# Patient Record
Sex: Male | Born: 1945
Health system: Southern US, Community
[De-identification: ages and names within clinical notes are randomized; demographics above are authoritative.]

## PROBLEM LIST (undated history)

## (undated) ENCOUNTER — Emergency Department (HOSPITAL_COMMUNITY)

## (undated) DIAGNOSIS — K219 Gastro-esophageal reflux disease without esophagitis: Secondary | ICD-10-CM

## (undated) DIAGNOSIS — I10 Essential (primary) hypertension: Secondary | ICD-10-CM

## (undated) DIAGNOSIS — E785 Hyperlipidemia, unspecified: Secondary | ICD-10-CM

## (undated) DIAGNOSIS — M545 Low back pain, unspecified: Secondary | ICD-10-CM

## (undated) DIAGNOSIS — F32A Depression, unspecified: Secondary | ICD-10-CM

## (undated) DIAGNOSIS — G4730 Sleep apnea, unspecified: Secondary | ICD-10-CM

## (undated) DIAGNOSIS — Z86010 Personal history of colonic polyps: Secondary | ICD-10-CM

## (undated) DIAGNOSIS — G4733 Obstructive sleep apnea (adult) (pediatric): Secondary | ICD-10-CM

## (undated) DIAGNOSIS — F329 Major depressive disorder, single episode, unspecified: Secondary | ICD-10-CM

## (undated) HISTORY — PX: KNEE ARTHROSCOPY: SHX127

## (undated) HISTORY — DX: Personal history of colonic polyps: Z86.010

## (undated) HISTORY — DX: Essential (primary) hypertension: I10

## (undated) HISTORY — DX: Sleep apnea, unspecified: G47.30

## (undated) HISTORY — DX: Obstructive sleep apnea (adult) (pediatric): G47.33

## (undated) HISTORY — DX: Low back pain, unspecified: M54.50

## (undated) HISTORY — DX: Major depressive disorder, single episode, unspecified: F32.9

## (undated) HISTORY — DX: Hyperlipidemia, unspecified: E78.5

## (undated) HISTORY — PX: OTHER SURGICAL HISTORY: SHX169

## (undated) HISTORY — DX: Low back pain: M54.5

## (undated) HISTORY — PX: TONSILLECTOMY: SUR1361

## (undated) HISTORY — PX: WISDOM TOOTH EXTRACTION: SHX21

## (undated) HISTORY — PX: COLONOSCOPY: SHX174

## (undated) HISTORY — DX: Depression, unspecified: F32.A

## (undated) HISTORY — DX: Gastro-esophageal reflux disease without esophagitis: K21.9

## (undated) HISTORY — PX: SHOULDER ARTHROSCOPY W/ ROTATOR CUFF REPAIR: SHX2400

---

## 2002-10-08 HISTORY — PX: COLONOSCOPY: SHX174

## 2004-11-08 ENCOUNTER — Ambulatory Visit: Payer: Self-pay | Admitting: Internal Medicine

## 2004-12-05 ENCOUNTER — Ambulatory Visit: Payer: Self-pay | Admitting: Internal Medicine

## 2005-01-16 ENCOUNTER — Ambulatory Visit: Payer: Self-pay | Admitting: Internal Medicine

## 2005-03-26 ENCOUNTER — Ambulatory Visit: Payer: Self-pay | Admitting: Internal Medicine

## 2005-03-27 ENCOUNTER — Ambulatory Visit: Payer: Self-pay

## 2005-04-12 ENCOUNTER — Ambulatory Visit: Payer: Self-pay | Admitting: Internal Medicine

## 2006-11-01 ENCOUNTER — Ambulatory Visit: Payer: Self-pay | Admitting: Internal Medicine

## 2006-12-30 ENCOUNTER — Ambulatory Visit: Payer: Self-pay | Admitting: Internal Medicine

## 2006-12-30 LAB — CONVERTED CEMR LAB
ALT: 27 units/L (ref 0–40)
Bilirubin Urine: NEGATIVE
Bilirubin, Direct: 0.1 mg/dL (ref 0.0–0.3)
Calcium: 9.2 mg/dL (ref 8.4–10.5)
Cholesterol: 190 mg/dL (ref 0–200)
Eosinophils Absolute: 0.1 10*3/uL (ref 0.0–0.6)
Eosinophils Relative: 1.4 % (ref 0.0–5.0)
GFR calc Af Amer: 128 mL/min
GFR calc non Af Amer: 106 mL/min
Glucose, Bld: 110 mg/dL — ABNORMAL HIGH (ref 70–99)
HDL: 35.2 mg/dL — ABNORMAL LOW (ref >39.0)
Hemoglobin, Urine: NEGATIVE
Lymphocytes Relative: 29.5 % (ref 12.0–46.0)
MCV: 91.8 fL (ref 78.0–100.0)
Monocytes Relative: 6.4 % (ref 3.0–11.0)
Neutro Abs: 3.7 10*3/uL (ref 1.4–7.7)
PSA: 1.56 ng/mL (ref 0.10–4.00)
Platelets: 261 10*3/uL (ref 150–400)
TSH: 1.43 microintl units/mL (ref 0.35–5.50)
Total CHOL/HDL Ratio: 5.4
Triglycerides: 113 mg/dL (ref 0–149)
Urine Glucose: NEGATIVE mg/dL
WBC: 6.1 10*3/uL (ref 4.5–10.5)

## 2007-01-07 ENCOUNTER — Ambulatory Visit: Payer: Self-pay | Admitting: Internal Medicine

## 2007-10-13 ENCOUNTER — Ambulatory Visit: Payer: Self-pay | Admitting: Internal Medicine

## 2007-10-13 DIAGNOSIS — I10 Essential (primary) hypertension: Secondary | ICD-10-CM | POA: Insufficient documentation

## 2007-10-13 DIAGNOSIS — F329 Major depressive disorder, single episode, unspecified: Secondary | ICD-10-CM | POA: Insufficient documentation

## 2007-10-13 DIAGNOSIS — E785 Hyperlipidemia, unspecified: Secondary | ICD-10-CM | POA: Insufficient documentation

## 2007-10-13 DIAGNOSIS — R7309 Other abnormal glucose: Secondary | ICD-10-CM | POA: Insufficient documentation

## 2007-10-13 DIAGNOSIS — F3289 Other specified depressive episodes: Secondary | ICD-10-CM | POA: Insufficient documentation

## 2008-02-10 ENCOUNTER — Ambulatory Visit: Payer: Self-pay | Admitting: Internal Medicine

## 2008-02-10 DIAGNOSIS — R5381 Other malaise: Secondary | ICD-10-CM | POA: Insufficient documentation

## 2008-02-10 DIAGNOSIS — R5383 Other fatigue: Secondary | ICD-10-CM

## 2008-02-11 ENCOUNTER — Encounter: Payer: Self-pay | Admitting: Internal Medicine

## 2008-10-19 ENCOUNTER — Ambulatory Visit: Payer: Self-pay | Admitting: Internal Medicine

## 2008-10-19 LAB — CONVERTED CEMR LAB
ALT: 50 units/L (ref 0–53)
AST: 28 units/L (ref 0–37)
Albumin: 4.2 g/dL (ref 3.5–5.2)
Alkaline Phosphatase: 66 units/L (ref 39–117)
BUN: 15 mg/dL (ref 6–23)
Bilirubin Urine: NEGATIVE
CO2: 29 meq/L (ref 19–32)
Chloride: 107 meq/L (ref 96–112)
Cholesterol: 210 mg/dL (ref 0–200)
Creatinine, Ser: 0.9 mg/dL (ref 0.4–1.5)
Glucose, Bld: 121 mg/dL — ABNORMAL HIGH (ref 70–99)
Hgb A1c MFr Bld: 5.3 % (ref 4.6–6.0)
Ketones, ur: NEGATIVE mg/dL
Total CHOL/HDL Ratio: 6.4
Total Protein, Urine: NEGATIVE mg/dL
Triglycerides: 74 mg/dL (ref 0–149)
Urine Glucose: NEGATIVE mg/dL
pH: 5.5 (ref 5.0–8.0)

## 2009-10-13 ENCOUNTER — Ambulatory Visit: Payer: Self-pay | Admitting: Internal Medicine

## 2009-10-13 DIAGNOSIS — M545 Low back pain, unspecified: Secondary | ICD-10-CM | POA: Insufficient documentation

## 2009-10-13 DIAGNOSIS — M533 Sacrococcygeal disorders, not elsewhere classified: Secondary | ICD-10-CM | POA: Insufficient documentation

## 2010-04-12 ENCOUNTER — Ambulatory Visit: Payer: Self-pay | Admitting: Internal Medicine

## 2010-11-05 LAB — CONVERTED CEMR LAB
ALT: 35 units/L (ref 0–53)
AST: 23 units/L (ref 0–37)
Alkaline Phosphatase: 64 units/L (ref 39–117)
BUN: 16 mg/dL (ref 6–23)
Basophils Absolute: 0 10*3/uL (ref 0.0–0.1)
Basophils Relative: 0.4 % (ref 0.0–3.0)
Bilirubin Urine: NEGATIVE
Bilirubin, Direct: 0.1 mg/dL (ref 0.0–0.3)
CO2: 29 meq/L (ref 19–32)
Calcium: 9.2 mg/dL (ref 8.4–10.5)
Eosinophils Relative: 1.1 % (ref 0.0–5.0)
GFR calc non Af Amer: 87.59 mL/min (ref >60)
Glucose, Bld: 107 mg/dL — ABNORMAL HIGH (ref 70–99)
HCT: 43.2 % (ref 39.0–52.0)
Hemoglobin, Urine: NEGATIVE
Ketones, ur: NEGATIVE mg/dL
Leukocytes, UA: NEGATIVE
Lymphs Abs: 1.8 10*3/uL (ref 0.7–4.0)
MCV: 96.2 fL (ref 78.0–100.0)
Monocytes Absolute: 0.7 10*3/uL (ref 0.1–1.0)
Monocytes Relative: 8.8 % (ref 3.0–12.0)
Neutrophils Relative %: 66.5 % (ref 43.0–77.0)
Nitrite: NEGATIVE
PSA: 1.04 ng/mL (ref 0.10–4.00)
Potassium: 5 meq/L (ref 3.5–5.1)
RBC: 4.49 M/uL (ref 4.22–5.81)
Specific Gravity, Urine: >=1.03 (ref 1.000–1.030)
Total Bilirubin: 0.4 mg/dL (ref 0.3–1.2)
Total CHOL/HDL Ratio: 5
Total Protein, Urine: NEGATIVE mg/dL
Total Protein: 6.9 g/dL (ref 6.0–8.3)
Urine Glucose: NEGATIVE mg/dL
Urobilinogen, UA: 0.2 (ref 0.0–1.0)
VLDL: 18.2 mg/dL (ref 0.0–40.0)
WBC: 8 10*3/uL (ref 4.5–10.5)
pH: 5.5 (ref 5.0–8.0)

## 2010-11-07 NOTE — Assessment & Plan Note (Signed)
Summary: MEDS--FU---STC   Vital Signs:  Patient profile:   65 year old male Height:      76 inches Weight:      264 pounds BMI:     32.25 Temp:     98.2 degrees F oral Pulse rate:   82 / minute BP sitting:   142 / 84  (left arm)  Vitals Entered By: Tora Perches (October 13, 2009 8:08 AM) CC: f/u   CC:  f/u.  History of Present Illness: The patient presents for a follow up of hypertension, GERD.  C/o LBP C/o tail bone pain x  months - no injury  Preventive Screening-Counseling & Management  Alcohol-Tobacco     Smoking Status: never  Current Medications (verified): 1)  Protonix 40 Mg  Tbec (Pantoprazole Sodium) .Marland Kitchen.. 1 Po Once Daily 2)  Lotrel 5-10 Mg  Caps (Amlodipine Besy-Benazepril Hcl) .... 2 By Mouth Qd 3)  Vitamin D3 1000 Unit  Tabs (Cholecalciferol) .Marland Kitchen.. 1 Qd 4)  Aspirin 81 Mg  Tbec (Aspirin) .... One By Mouth Every Day  Allergies: 1)  Verapamil 2)  Enalapril Maleate  Past History:  Family History: Last updated: 10/13/2007 Family History Hypertension  Social History: Last updated: 02/10/2008 Retired, owns a Electrical engineer business Divorced Daughter died of breast CA Mar 10, 2002  Past Medical History: Depression GERD Hyperlipidemia Hypertension Low back pain  Review of Systems  The patient denies fever, weight loss, chest pain, and hemoptysis.    Physical Exam  General:  NAD Ears:  External ear exam shows no significant lesions or deformities.  Otoscopic examination reveals clear canals, tympanic membranes are intact bilaterally without bulging, retraction, inflammation or discharge. Hearing is grossly normal bilaterally. Nose:  External nasal examination shows no deformity or inflammation. Nasal mucosa are pink and moist without lesions or exudates. Mouth:  Oral mucosa and oropharynx without lesions or exudates.  Teeth in good repair. Lungs:  Normal respiratory effort, chest expands symmetrically. Lungs are clear to auscultation, no crackles or  wheezes. Heart:  Normal rate and regular rhythm. S1 and S2 normal without gallop, murmur, click, rub or other extra sounds. Abdomen:  Bowel sounds positive,abdomen soft and non-tender without masses, organomegaly or hernias noted. Msk:  No deformity or scoliosis noted of thoracic or lumbar spine.  Coccyx tender Neurologic:  No cranial nerve deficits noted. Station and gait are normal. Plantar reflexes are down-going bilaterally. DTRs are symmetrical throughout. Sensory, motor and coordinative functions appear intact. Skin:  Intact without suspicious lesions or rashes Psych:  Cognition and judgment appear intact. Alert and cooperative with normal attention span and concentration. No apparent delusions, illusions, hallucinations   Impression & Recommendations:  Problem # 1:  COCCYGEAL PAIN (ICD-724.79) Assessment New See "Patient Instructions".   Problem # 2:  LOW BACK PAIN (ICD-724.2) Assessment: New See "Patient Instructions".  His updated medication list for this problem includes:    Aspirin 81 Mg Tbec (Aspirin) ..... One by mouth every day  Problem # 3:  HYPERTENSION (ICD-401.9) Assessment: Unchanged  His updated medication list for this problem includes:    Lotrel 5-10 Mg Caps (Amlodipine besy-benazepril hcl) .Marland Kitchen... 2 by mouth qd  BP today: 142/84 Prior BP: 154/94 (10/19/2008)  Labs Reviewed: K+: 4.5 (10/19/2008) Creat: : 0.9 (10/19/2008)   Chol: 210 (10/19/2008)   HDL: 32.9 (10/19/2008)   LDL: DEL (10/19/2008)   TG: 74 (10/19/2008)  Problem # 4:  GERD (ICD-530.81) Assessment: Unchanged  His updated medication list for this problem includes:    Protonix 40 Mg  Tbec (Pantoprazole sodium) .Marland Kitchen... 1 po once daily  Complete Medication List: 1)  Protonix 40 Mg Tbec (Pantoprazole sodium) .Marland Kitchen.. 1 po once daily 2)  Lotrel 5-10 Mg Caps (Amlodipine besy-benazepril hcl) .... 2 by mouth qd 3)  Vitamin D3 1000 Unit Tabs (Cholecalciferol) .Marland Kitchen.. 1 qd 4)  Aspirin 81 Mg Tbec (Aspirin) ....  One by mouth every day  Other Orders: Zoster (Shingles) Vaccine Live [phone]) Admin 1st Vaccine (60454)  Patient Instructions: 1)  Use stretching exercises that I have provided (15 min. or longer every day) 2)  Get a wedge pillow 3)  Try to eat more raw plant food, fresh and dry fruit, raw almonds, leafy vegetables, whole foods and less red meat, less animal fat. Poultry and fish is better for you than pork and beef. Avoid processed foods (canned soups, hot dogs, sausage, bacon , frozen dinners). Avoid corn syrup, high fructose syrup or aspartam and Splenda  containing drinks. Honey, Agave and Stevia are better sweeteners. Make your own  dressing with olive oil, wine vinegar, lemon juce, garlic etc. for your salads.  4)  Please schedule a follow-up appointment in 6 months well w/labs v70.0. Prescriptions: LOTREL 5-10 MG  CAPS (AMLODIPINE BESY-BENAZEPRIL HCL) 2 by mouth qd  #60 x 12   Entered and Authorized by:   Tresa Garter MD   Signed by:   Tresa Garter MD on 10/13/2009   Method used:   Electronically to        [facility] (retail)       [address]       [place], [place]  [address]       Ph: [phone] or [phone]       Fax: [phone]   RxID:   [id number] PROTONIX 40 MG  TBEC (PANTOPRAZOLE SODIUM) 1 po once daily  #30 x 12   Entered and Authorized by:   Tresa Garter MD   Signed by:   Tresa Garter MD on 10/13/2009   Method used:   Electronically to        [facility] (retail)       [address]       [place], [place]  [address]       Ph: [phone] or [phone]       Fax: [phone]   RxID:   [id number]    Immunizations Administered:  Zostavax # 1:    Vaccine Type: Zostavax    Site: left deltoid    Mfr: Merck    Dose: 0.5 ml    Route: [place]    Given by: Tora Perches    Exp. Date: 10/05/2011    Lot #: [id number]    VIS given: 07/20/05 given October 13, 2009.

## 2010-11-07 NOTE — Assessment & Plan Note (Signed)
Summary: 6 MOS CPX/WELLPATH/#/CD   Vital Signs:  Patient profile:   65 year old male Height:      76 inches (193.04 cm) Weight:      261 pounds (118.64 kg) BMI:     31.88 O2 Sat:      97 % on Room air Temp:     98.4 degrees F (36.89 degrees C) oral Pulse rate:   75 / minute Pulse rhythm:   regular Resp:     16 per minute BP sitting:   128 / 88  (left arm) Cuff size:   regular  Vitals Entered By: Lanier Prude, CMA(AAMA) (April 12, 2010 8:23 AM)  O2 Flow:  Room air CC: CPX Is Patient Diabetic? No   CC:  CPX.  History of Present Illness: The patient presents for a wellness examination   Preventive Screening-Counseling & Management  Alcohol-Tobacco     Smoking Status: never  Caffeine-Diet-Exercise     Does Patient Exercise: yes  Current Medications (verified): 1)  Protonix 40 Mg  Tbec (Pantoprazole Sodium) .Marland Kitchen.. 1 Po Once Daily 2)  Lotrel 5-10 Mg  Caps (Amlodipine Besy-Benazepril Hcl) .... 2 By Mouth Qd 3)  Vitamin D3 1000 Unit  Tabs (Cholecalciferol) .Marland Kitchen.. 1 Qd 4)  Aspirin 81 Mg  Tbec (Aspirin) .... One By Mouth Every Day  Allergies (verified): 1)  Verapamil 2)  Enalapril Maleate  Past History:  Past Medical History: Last updated: 10/13/2009 Depression GERD Hyperlipidemia Hypertension Low back pain  Past Surgical History: L shoulder rot cuff R knee arthrosc  Family History: Family History Hypertension F died at 62  Social History: Retired, owns a Insurance underwriter Divorced Daughter died of breast CA 2002/02/23 Never Smoked Alcohol use-yes Regular exercise-yes seldom; working physicallly all thetime Does Patient Exercise:  yes  Review of Systems  The patient denies anorexia, fever, weight loss, weight gain, vision loss, decreased hearing, hoarseness, chest pain, syncope, dyspnea on exertion, peripheral edema, prolonged cough, headaches, hemoptysis, abdominal pain, melena, hematochezia, severe indigestion/heartburn, hematuria, incontinence, genital  sores, muscle weakness, suspicious skin lesions, transient blindness, difficulty walking, depression, unusual weight change, abnormal bleeding, enlarged lymph nodes, angioedema, and testicular masses.    Physical Exam  General:  NAD Head:  Normocephalic and atraumatic without obvious abnormalities. No apparent alopecia or balding. Eyes:  No corneal or conjunctival inflammation noted. EOMI. Perrla. Ears:  External ear exam shows no significant lesions or deformities.  Otoscopic examination reveals clear canals, tympanic membranes are intact bilaterally without bulging, retraction, inflammation or discharge. Hearing is grossly normal bilaterally. Nose:  External nasal examination shows no deformity or inflammation. Nasal mucosa are pink and moist without lesions or exudates. Mouth:  Oral mucosa and oropharynx without lesions or exudates.  Teeth in good repair. Neck:  No deformities, masses, or tenderness noted. Lungs:  Normal respiratory effort, chest expands symmetrically. Lungs are clear to auscultation, no crackles or wheezes. Heart:  Normal rate and regular rhythm. S1 and S2 normal without gallop, murmur, click, rub or other extra sounds. Abdomen:  Bowel sounds positive,abdomen soft and non-tender without masses, organomegaly or hernias noted. Neurologic:  No cranial nerve deficits noted. Station and gait are normal. Plantar reflexes are down-going bilaterally. DTRs are symmetrical throughout. Sensory, motor and coordinative functions appear intact. Skin:  Intact without suspicious lesions or rashes Cervical Nodes:  No lymphadenopathy noted Psych:  Cognition and judgment appear intact. Alert and cooperative with normal attention span and concentration. No apparent delusions, illusions, hallucinations   Impression & Recommendations:  Problem #  1:  WELL ADULT EXAM (ICD-V70.0) Assessment New Colon about 5 years ago. He will be recalled by GI for a repeat colonoscopy Orders: EKG w/  Interpretation (93000) Health and age related issues were discussed. Available screening tests and vaccinations were discussed as well. Healthy life style including good diet and execise was discussed.  Labs is pending  Will give dT and Flu vaccines this Fall  Problem # 2:  HYPERTENSION (ICD-401.9) Assessment: Unchanged  His updated medication list for this problem includes:    Lotrel 5-10 Mg Caps (Amlodipine besy-benazepril hcl) .Marland Kitchen... 2 by mouth qd  Problem # 3:  HYPERLIPIDEMIA (ICD-272.4) Assessment: Comment Only  Problem # 4:  DEPRESSION (ICD-311) Assessment: Improved  Complete Medication List: 1)  Protonix 40 Mg Tbec (Pantoprazole sodium) .Marland Kitchen.. 1 po once daily 2)  Lotrel 5-10 Mg Caps (Amlodipine besy-benazepril hcl) .... 2 by mouth qd 3)  Vitamin D3 1000 Unit Tabs (Cholecalciferol) .Marland Kitchen.. 1 qd 4)  Aspirin 81 Mg Tbec (Aspirin) .... One by mouth every day  Patient Instructions: 1)  Please schedule a follow-up appointment in 12  months well w/labs. Prescriptions: PROTONIX 40 MG  TBEC (PANTOPRAZOLE SODIUM) 1 po once daily  #30 x 12   Entered and Authorized by:   Tresa Garter MD   Signed by:   Tresa Garter MD on 04/12/2010   Method used:   Electronically to        [facility] (retail)       [address]       [place], [place]  [address]       Ph: [phone] or [phone]       Fax: [phone]   RxID:   [id number] LOTREL 5-10 MG  CAPS (AMLODIPINE BESY-BENAZEPRIL HCL) 2 by mouth qd  #60 x 12   Entered and Authorized by:   Tresa Garter MD   Signed by:   Tresa Garter MD on 04/12/2010   Method used:   Electronically to        [facility] (retail)       [address]       [place], [place]  [address]       Ph: [phone] or [phone]       Fax: [phone]   RxID:   [id number]

## 2011-02-23 NOTE — Assessment & Plan Note (Signed)
[facility]                           PRIMARY CARE OFFICE NOTE   NAME:Hector Edwards                         MRN:          [id number]  DATE:01/07/2007                            DOB:          09/04/1946    The patient is a 65 year old male who presents for wellness examination.   PAST MEDICAL HISTORY, FAMILY HISTORY, SOCIAL HISTORY:  As per April 27, 2003 note.  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  He lives by himself.  His daughter died.   CURRENT MEDICATIONS:  Reviewed with the patient.   ALLERGIES:  None.   REVIEW OF SYSTEMS:  No chest pain or shortness of breath, no syncope, no  neurologic complaints, no headache.  The rest of the 18 point review of  system is negative.   PHYSICAL EXAMINATION:  Blood pressure 160/95, pulse 79, temperature  96.7, weight 251 pounds.  He looks well.  He is in no acute distress.  HEENT:  Moist mucosa.  NECK:  Supple. No thyromegaly.  Slight left carotid bruit.  LUNGS:  Clear to auscultation and percussion, no wheeze or rales.  HEART:  S1, S2, no murmur, no gallop.  ABDOMEN:  Soft, nontender, no organomegaly, no mass palpable.  EXTREMITIES:  No edema.  He is alert, oriented and cooperative.  He denies being depressed.  SKIN:  Clear.  RECTAL:  Examination reveals a normal prostate, stool guaiac negative,  no masses.   LABS:  On December 30, 2006, CBC normal, glucose 110, cholesterol 190, HDL  35, LDL 132.  TSH normal.  PSA 1.56, urinalysis is normal.   EKG with normal sinus rhythm.   ASSESSMENT AND PLAN:  1. Normal wellness examination.  Age/health-related issues discussed.      Healthy lifestyle discussed.  He had colonoscopy in 2004.  Repeat      exam in 12 months.  2. Hypertension.  Will increase Lotrel to 10/40 and recheck in 3      months.  3. Left carotid bruit; obtain carotid Doppler ultrasound.    Georgina Quint. Plotnikov, MD  Electronically Signed   AVP/MedQ  DD:  01/09/2007  DT: 01/09/2007  Job #: [id number]

## 2011-04-14 ENCOUNTER — Other Ambulatory Visit: Payer: Self-pay | Admitting: Internal Medicine

## 2011-04-28 ENCOUNTER — Other Ambulatory Visit: Payer: Self-pay | Admitting: Internal Medicine

## 2011-08-06 ENCOUNTER — Other Ambulatory Visit: Payer: Self-pay | Admitting: Internal Medicine

## 2011-08-07 ENCOUNTER — Encounter: Payer: Self-pay | Admitting: Internal Medicine

## 2011-08-08 ENCOUNTER — Ambulatory Visit (INDEPENDENT_AMBULATORY_CARE_PROVIDER_SITE_OTHER): Payer: No Typology Code available for payment source | Admitting: Internal Medicine

## 2011-08-08 ENCOUNTER — Encounter: Payer: Self-pay | Admitting: Internal Medicine

## 2011-08-08 VITALS — BP 190/100 | HR 88 | Temp 97.2°F | Resp 16 | Wt 276.0 lb

## 2011-08-08 DIAGNOSIS — E785 Hyperlipidemia, unspecified: Secondary | ICD-10-CM

## 2011-08-08 DIAGNOSIS — K219 Gastro-esophageal reflux disease without esophagitis: Secondary | ICD-10-CM

## 2011-08-08 DIAGNOSIS — M545 Low back pain, unspecified: Secondary | ICD-10-CM

## 2011-08-08 DIAGNOSIS — M1990 Unspecified osteoarthritis, unspecified site: Secondary | ICD-10-CM | POA: Insufficient documentation

## 2011-08-08 DIAGNOSIS — F329 Major depressive disorder, single episode, unspecified: Secondary | ICD-10-CM

## 2011-08-08 DIAGNOSIS — Z23 Encounter for immunization: Secondary | ICD-10-CM

## 2011-08-08 DIAGNOSIS — I10 Essential (primary) hypertension: Secondary | ICD-10-CM

## 2011-08-08 MED ORDER — MELOXICAM 15 MG PO TABS: 15.0000 mg | ORAL_TABLET | Freq: Every day | ORAL | Status: DC | PRN

## 2011-08-08 MED ORDER — OLMESARTAN-AMLODIPINE-HCTZ 40-10-25 MG PO TABS: 1.0000 | ORAL_TABLET | ORAL | Status: DC

## 2011-08-08 MED ORDER — PANTOPRAZOLE SODIUM 40 MG PO TBEC: 40.0000 mg | DELAYED_RELEASE_TABLET | Freq: Every day | ORAL | Status: DC

## 2011-08-08 MED ORDER — TRAMADOL HCL 50 MG PO TABS: 50.0000 mg | ORAL_TABLET | Freq: Two times a day (BID) | ORAL | Status: DC | PRN

## 2011-08-08 NOTE — Assessment & Plan Note (Signed)
We can try Mobic, Tramadol

## 2011-08-08 NOTE — Progress Notes (Signed)
  Subjective:    Patient ID: Hector Edwards, male    DOB: 1946-08-03, 65 y.o.   MRN: [id number]  HPI  The patient presents for a follow-up of  chronic hypertension, chronic dyslipidemia, depression, LBP controlled with medicines     Review of Systems  Constitutional: Negative for appetite change, fatigue and unexpected weight change.  HENT: Negative for nosebleeds, congestion, sore throat, sneezing, trouble swallowing and neck pain.   Eyes: Negative for itching and visual disturbance.  Respiratory: Negative for cough.   Cardiovascular: Negative for chest pain, palpitations and leg swelling.  Gastrointestinal: Negative for nausea, diarrhea, blood in stool and abdominal distention.  Genitourinary: Negative for frequency and hematuria.  Musculoskeletal: Negative for back pain, joint swelling and gait problem.  Skin: Negative for rash.  Neurological: Negative for dizziness, tremors, speech difficulty and weakness.  Psychiatric/Behavioral: Negative for sleep disturbance, dysphoric mood and agitation. The patient is not nervous/anxious.    Wt Readings from Last 3 Encounters:  08/08/11 276 lb (125.193 kg)  04/12/10 261 lb (118.389 kg)  10/13/09 264 lb (119.75 kg)   BP Readings from Last 3 Encounters:  08/08/11 190/100  04/12/10 128/88  10/13/09 142/84        Objective:   Physical Exam  Constitutional: He is oriented to person, place, and time. He appears well-developed.  HENT:  Mouth/Throat: Oropharynx is clear and moist.  Eyes: Conjunctivae are normal. Pupils are equal, round, and reactive to light.  Neck: Normal range of motion. No JVD present. No thyromegaly present.  Cardiovascular: Normal rate, regular rhythm, normal heart sounds and intact distal pulses.  Exam reveals no gallop and no friction rub.   No murmur heard. Pulmonary/Chest: Effort normal and breath sounds normal. No respiratory distress. He has no wheezes. He has no rales. He exhibits no tenderness.  Abdominal:  Soft. Bowel sounds are normal. He exhibits no distension and no mass. There is no tenderness. There is no rebound and no guarding.  Musculoskeletal: Normal range of motion. He exhibits no edema and no tenderness.  Lymphadenopathy:    He has no cervical adenopathy.  Neurological: He is alert and oriented to person, place, and time. He has normal reflexes. No cranial nerve deficit. He exhibits normal muscle tone. Coordination normal.  Skin: Skin is warm and dry. No rash noted.  Psychiatric: He has a normal mood and affect. His behavior is normal. Judgment and thought content normal.          Assessment & Plan:

## 2011-08-12 ENCOUNTER — Encounter: Payer: Self-pay | Admitting: Internal Medicine

## 2011-08-12 DIAGNOSIS — K219 Gastro-esophageal reflux disease without esophagitis: Secondary | ICD-10-CM | POA: Insufficient documentation

## 2011-08-12 NOTE — Assessment & Plan Note (Signed)
Not well controlled See Meds

## 2011-08-12 NOTE — Assessment & Plan Note (Signed)
Not on Rx now 

## 2011-08-12 NOTE — Assessment & Plan Note (Signed)
See OA A/P

## 2011-08-12 NOTE — Assessment & Plan Note (Signed)
Continue with current prescription therapy as reflected on the Med list.  

## 2011-08-12 NOTE — Assessment & Plan Note (Signed)
  On diet  

## 2011-11-07 ENCOUNTER — Ambulatory Visit (INDEPENDENT_AMBULATORY_CARE_PROVIDER_SITE_OTHER): Payer: No Typology Code available for payment source | Admitting: Internal Medicine

## 2011-11-07 ENCOUNTER — Encounter: Payer: Self-pay | Admitting: Internal Medicine

## 2011-11-07 VITALS — BP 162/100 | HR 88 | Temp 97.0°F | Resp 16 | Wt 273.0 lb

## 2011-11-07 DIAGNOSIS — M545 Low back pain, unspecified: Secondary | ICD-10-CM

## 2011-11-07 DIAGNOSIS — I10 Essential (primary) hypertension: Secondary | ICD-10-CM

## 2011-11-07 DIAGNOSIS — M1990 Unspecified osteoarthritis, unspecified site: Secondary | ICD-10-CM

## 2011-11-07 DIAGNOSIS — R5383 Other fatigue: Secondary | ICD-10-CM | POA: Insufficient documentation

## 2011-11-07 DIAGNOSIS — F329 Major depressive disorder, single episode, unspecified: Secondary | ICD-10-CM

## 2011-11-07 DIAGNOSIS — R5381 Other malaise: Secondary | ICD-10-CM

## 2011-11-07 MED ORDER — CARVEDILOL 12.5 MG PO TABS: 12.5000 mg | ORAL_TABLET | Freq: Two times a day (BID) | ORAL | Status: DC

## 2011-11-07 MED ORDER — AMLODIPINE BESY-BENAZEPRIL HCL 5-10 MG PO CAPS: 2.0000 | ORAL_CAPSULE | Freq: Every day | ORAL | Status: DC

## 2011-11-07 NOTE — Assessment & Plan Note (Signed)
Continue with current prescription therapy as reflected on the Med list.  

## 2011-11-07 NOTE — Assessment & Plan Note (Signed)
Better w/rest

## 2011-11-07 NOTE — Assessment & Plan Note (Signed)
Off meds 

## 2011-11-07 NOTE — Progress Notes (Signed)
  Subjective:    Patient ID: Hector Edwards, male    DOB: April 26, 1946, 66 y.o.   MRN: [id number]  HPI  F/u HTN. C/o edema, fatigue, CP on Tribenzor x 2-3 wks: resolved on 1/2 a day. He had no problems w/Lotrel...  Review of Systems  Constitutional: Negative for appetite change, fatigue and unexpected weight change.  HENT: Negative for nosebleeds, congestion, sore throat, sneezing, trouble swallowing and neck pain.   Eyes: Negative for itching and visual disturbance.  Respiratory: Positive for chest tightness and shortness of breath. Negative for cough.        Last - 2 wks ago  Cardiovascular: Negative for chest pain, palpitations and leg swelling.  Gastrointestinal: Negative for nausea, diarrhea, blood in stool and abdominal distention.  Genitourinary: Negative for frequency and hematuria.  Musculoskeletal: Negative for back pain, joint swelling and gait problem.  Skin: Negative for rash.  Neurological: Negative for dizziness, tremors, speech difficulty and weakness.  Psychiatric/Behavioral: Negative for sleep disturbance, dysphoric mood and agitation. The patient is not nervous/anxious.        Objective:   Physical Exam  Constitutional: He is oriented to person, place, and time. He appears well-developed.  HENT:  Mouth/Throat: Oropharynx is clear and moist.  Eyes: Conjunctivae are normal. Pupils are equal, round, and reactive to light.  Neck: Normal range of motion. No JVD present. No thyromegaly present.  Cardiovascular: Normal rate, regular rhythm, normal heart sounds and intact distal pulses.  Exam reveals no gallop and no friction rub.   No murmur heard. Pulmonary/Chest: Effort normal and breath sounds normal. No respiratory distress. He has no wheezes. He has no rales. He exhibits no tenderness.  Abdominal: Soft. Bowel sounds are normal. He exhibits no distension and no mass. There is no tenderness. There is no rebound and no guarding.  Musculoskeletal: Normal range of motion. He  exhibits no edema and no tenderness.  Lymphadenopathy:    He has no cervical adenopathy.  Neurological: He is alert and oriented to person, place, and time. He has normal reflexes. No cranial nerve deficit. He exhibits normal muscle tone. Coordination normal.  Skin: Skin is warm and dry. No rash noted.  Psychiatric: He has a normal mood and affect. His behavior is normal. Judgment and thought content normal.          Assessment & Plan:

## 2011-11-07 NOTE — Assessment & Plan Note (Signed)
D/c Tribenzor Re-start Lotrel Add Coreg if BP is up

## 2011-11-07 NOTE — Assessment & Plan Note (Signed)
He is on 1/2 tab/d - better Will d/c Tribenzor

## 2012-02-13 ENCOUNTER — Encounter: Payer: Self-pay | Admitting: Internal Medicine

## 2012-02-13 ENCOUNTER — Ambulatory Visit (INDEPENDENT_AMBULATORY_CARE_PROVIDER_SITE_OTHER): Payer: No Typology Code available for payment source | Admitting: Internal Medicine

## 2012-02-13 VITALS — BP 190/100 | HR 80 | Temp 97.6°F | Resp 16 | Wt 280.0 lb

## 2012-02-13 DIAGNOSIS — I10 Essential (primary) hypertension: Secondary | ICD-10-CM

## 2012-02-13 DIAGNOSIS — R0989 Other specified symptoms and signs involving the circulatory and respiratory systems: Secondary | ICD-10-CM

## 2012-02-13 DIAGNOSIS — K219 Gastro-esophageal reflux disease without esophagitis: Secondary | ICD-10-CM

## 2012-02-13 DIAGNOSIS — M545 Low back pain, unspecified: Secondary | ICD-10-CM

## 2012-02-13 DIAGNOSIS — F3289 Other specified depressive episodes: Secondary | ICD-10-CM

## 2012-02-13 DIAGNOSIS — R0609 Other forms of dyspnea: Secondary | ICD-10-CM

## 2012-02-13 DIAGNOSIS — R0683 Snoring: Secondary | ICD-10-CM | POA: Insufficient documentation

## 2012-02-13 DIAGNOSIS — F329 Major depressive disorder, single episode, unspecified: Secondary | ICD-10-CM

## 2012-02-13 MED ORDER — CARVEDILOL 25 MG PO TABS: 25.0000 mg | ORAL_TABLET | Freq: Two times a day (BID) | ORAL | Status: DC

## 2012-02-13 NOTE — Progress Notes (Signed)
Patient ID: Hector Edwards, male   DOB: May 23, 1946, 66 y.o.   MRN: [id number]  Subjective:    Patient ID: Hector Edwards, male    DOB: November 17, 1945, 66 y.o.   MRN: [id number]  HPI  F/u HTN - SBP is 160 or so at home, edema - resolved, fatigue. He has no problems w/Lotrel.Marland KitchenMarland KitchenHe is snoring  Wt Readings from Last 3 Encounters:  02/13/12 280 lb (127.007 kg)  11/07/11 273 lb (123.832 kg)  08/08/11 276 lb (125.193 kg)   BP Readings from Last 3 Encounters:  02/13/12 190/100  11/07/11 162/100  08/08/11 190/100      Review of Systems  Constitutional: Negative for appetite change, fatigue and unexpected weight change.  HENT: Negative for nosebleeds, congestion, sore throat, sneezing, trouble swallowing and neck pain.   Eyes: Negative for itching and visual disturbance.  Respiratory: Positive for chest tightness and shortness of breath. Negative for cough.        Last - 2 wks ago  Cardiovascular: Negative for chest pain, palpitations and leg swelling.  Gastrointestinal: Negative for nausea, diarrhea, blood in stool and abdominal distention.  Genitourinary: Negative for frequency and hematuria.  Musculoskeletal: Negative for back pain, joint swelling and gait problem.  Skin: Negative for rash.  Neurological: Negative for dizziness, tremors, speech difficulty and weakness.  Psychiatric/Behavioral: Negative for sleep disturbance, dysphoric mood and agitation. The patient is not nervous/anxious.        Objective:   Physical Exam  Constitutional: He is oriented to person, place, and time. He appears well-developed.  HENT:  Mouth/Throat: Oropharynx is clear and moist.  Eyes: Conjunctivae are normal. Pupils are equal, round, and reactive to light.  Neck: Normal range of motion. No JVD present. No thyromegaly present.  Cardiovascular: Normal rate, regular rhythm, normal heart sounds and intact distal pulses.  Exam reveals no gallop and no friction rub.   No murmur heard. Pulmonary/Chest: Effort  normal and breath sounds normal. No respiratory distress. He has no wheezes. He has no rales. He exhibits no tenderness.  Abdominal: Soft. Bowel sounds are normal. He exhibits no distension and no mass. There is no tenderness. There is no rebound and no guarding.  Musculoskeletal: Normal range of motion. He exhibits edema (trace B). He exhibits no tenderness.  Lymphadenopathy:    He has no cervical adenopathy.  Neurological: He is alert and oriented to person, place, and time. He has normal reflexes. No cranial nerve deficit. He exhibits normal muscle tone. Coordination normal.  Skin: Skin is warm and dry. No rash noted.  Psychiatric: He has a normal mood and affect. His behavior is normal. Judgment and thought content normal.     Lab Results  Component Value Date   WBC 8.0 04/12/2010   HGB 14.8 04/12/2010   HCT 43.2 04/12/2010   PLT 224.0 04/12/2010   GLUCOSE 107* 04/12/2010   CHOL 162 04/12/2010   TRIG 91.0 04/12/2010   HDL 35.70* 04/12/2010   LDLDIRECT 158.5 10/19/2008   LDLCALC 108* 04/12/2010   ALT 35 04/12/2010   AST 23 04/12/2010   NA 142 04/12/2010   K 5.0 04/12/2010   CL 107 04/12/2010   CREATININE 0.9 04/12/2010   BUN 16 04/12/2010   CO2 29 04/12/2010   TSH 1.61 04/12/2010   PSA 1.04 04/12/2010   HGBA1C 5.3 10/19/2008        Assessment & Plan:

## 2012-02-13 NOTE — Assessment & Plan Note (Signed)
Continue with current prescription therapy as reflected on the Med list.  

## 2012-02-13 NOTE — Assessment & Plan Note (Signed)
Sleep study

## 2012-02-13 NOTE — Assessment & Plan Note (Signed)
Not on Rx 

## 2012-02-13 NOTE — Assessment & Plan Note (Signed)
Risks associated with treatment noncompliance were discussed. Compliance was encouraged. Increase  Coreg and take it bid

## 2012-02-13 NOTE — Patient Instructions (Signed)
Wt Readings from Last 3 Encounters:  02/13/12 280 lb (127.007 kg)  11/07/11 273 lb (123.832 kg)  08/08/11 276 lb (125.193 kg)   BP Readings from Last 3 Encounters:  02/13/12 190/100  11/07/11 162/100  08/08/11 190/100

## 2012-05-14 ENCOUNTER — Ambulatory Visit: Payer: No Typology Code available for payment source | Admitting: Internal Medicine

## 2012-05-16 ENCOUNTER — Other Ambulatory Visit: Payer: Self-pay | Admitting: Internal Medicine

## 2012-08-04 ENCOUNTER — Other Ambulatory Visit: Payer: Self-pay | Admitting: *Deleted

## 2012-08-04 MED ORDER — AMLODIPINE BESY-BENAZEPRIL HCL 5-10 MG PO CAPS: 2.0000 | ORAL_CAPSULE | Freq: Every day | ORAL | Status: DC

## 2012-08-04 MED ORDER — MELOXICAM 15 MG PO TABS: ORAL_TABLET | ORAL | Status: DC

## 2012-08-04 MED ORDER — PANTOPRAZOLE SODIUM 40 MG PO TBEC: 40.0000 mg | DELAYED_RELEASE_TABLET | Freq: Every day | ORAL | Status: DC

## 2012-08-04 MED ORDER — CARVEDILOL 25 MG PO TABS: 25.0000 mg | ORAL_TABLET | Freq: Two times a day (BID) | ORAL | Status: DC

## 2012-08-04 NOTE — Telephone Encounter (Signed)
R'cd fax from [facility] for refill of Losartan, Carvedilol, Meloxicam, Pantoprazole and Lotrel.

## 2012-08-06 ENCOUNTER — Telehealth: Payer: Self-pay | Admitting: *Deleted

## 2012-08-06 NOTE — Telephone Encounter (Signed)
OK to fill this prescription with additional refills x1 Thank you!  

## 2012-08-06 NOTE — Telephone Encounter (Signed)
Rf req for Tramadol 50 mg. Ok to Rf? Please advise on quantity and Rfs.

## 2012-08-07 MED ORDER — TRAMADOL HCL 50 MG PO TABS: 50.0000 mg | ORAL_TABLET | Freq: Two times a day (BID) | ORAL | Status: DC | PRN

## 2012-08-07 NOTE — Telephone Encounter (Signed)
Done

## 2013-03-10 ENCOUNTER — Other Ambulatory Visit: Payer: Self-pay | Admitting: Internal Medicine

## 2013-04-16 ENCOUNTER — Other Ambulatory Visit: Payer: Self-pay | Admitting: Internal Medicine

## 2013-04-22 ENCOUNTER — Telehealth: Payer: Self-pay | Admitting: Internal Medicine

## 2013-04-22 NOTE — Telephone Encounter (Signed)
Patient would like to transfer to [facility] due to location. Is this okay? Thanks! °

## 2013-04-22 NOTE — Telephone Encounter (Signed)
Ok w/me Thx 

## 2013-04-28 NOTE — Telephone Encounter (Signed)
Spoke to patient and he will have to call back to schedule appointment

## 2013-05-04 ENCOUNTER — Ambulatory Visit (INDEPENDENT_AMBULATORY_CARE_PROVIDER_SITE_OTHER): Payer: No Typology Code available for payment source | Admitting: Physician Assistant

## 2013-05-04 ENCOUNTER — Encounter: Payer: Self-pay | Admitting: Physician Assistant

## 2013-05-04 VITALS — BP 124/78 | HR 89 | Temp 97.8°F | Resp 20 | Ht 75.0 in | Wt 280.5 lb

## 2013-05-04 DIAGNOSIS — Z0000 Encounter for general adult medical examination without abnormal findings: Secondary | ICD-10-CM

## 2013-05-04 DIAGNOSIS — K219 Gastro-esophageal reflux disease without esophagitis: Secondary | ICD-10-CM

## 2013-05-04 DIAGNOSIS — N529 Male erectile dysfunction, unspecified: Secondary | ICD-10-CM

## 2013-05-04 DIAGNOSIS — R0683 Snoring: Secondary | ICD-10-CM

## 2013-05-04 DIAGNOSIS — I10 Essential (primary) hypertension: Secondary | ICD-10-CM

## 2013-05-04 DIAGNOSIS — R0609 Other forms of dyspnea: Secondary | ICD-10-CM

## 2013-05-04 DIAGNOSIS — R0989 Other specified symptoms and signs involving the circulatory and respiratory systems: Secondary | ICD-10-CM

## 2013-05-04 LAB — CBC WITH DIFFERENTIAL/PLATELET
Basophils Relative: 0 % (ref 0–1)
Hemoglobin: 16.4 g/dL (ref 13.0–17.0)
MCHC: 35.7 g/dL (ref 30.0–36.0)
Monocytes Relative: 9 % (ref 3–12)
Neutro Abs: 3.7 10*3/uL (ref 1.7–7.7)
Neutrophils Relative %: 58 % (ref 43–77)
RBC: 5.01 MIL/uL (ref 4.22–5.81)

## 2013-05-04 LAB — LIPID PANEL
LDL Cholesterol: 137 mg/dL — ABNORMAL HIGH (ref 0–99)
Triglycerides: 83 mg/dL (ref <150)

## 2013-05-04 LAB — TSH: TSH: 1.542 u[IU]/mL (ref 0.350–4.500)

## 2013-05-04 LAB — COMPREHENSIVE METABOLIC PANEL
ALT: 44 U/L (ref 0–53)
AST: 25 U/L (ref 0–37)
Albumin: 4.5 g/dL (ref 3.5–5.2)
Alkaline Phosphatase: 68 U/L (ref 39–117)
Glucose, Bld: 108 mg/dL — ABNORMAL HIGH (ref 70–99)
Potassium: 4.3 mEq/L (ref 3.5–5.3)
Sodium: 138 mEq/L (ref 135–145)
Total Protein: 7.3 g/dL (ref 6.0–8.3)

## 2013-05-04 LAB — PSA: PSA: 1.25 ng/mL (ref <=4.00)

## 2013-05-04 MED ORDER — AMLODIPINE BESY-BENAZEPRIL HCL 5-10 MG PO CAPS: ORAL_CAPSULE | ORAL | Status: DC

## 2013-05-04 MED ORDER — PANTOPRAZOLE SODIUM 40 MG PO TBEC: DELAYED_RELEASE_TABLET | ORAL | Status: DC

## 2013-05-04 MED ORDER — TRAMADOL HCL 50 MG PO TABS: 50.0000 mg | ORAL_TABLET | Freq: Two times a day (BID) | ORAL | Status: AC | PRN

## 2013-05-04 MED ORDER — SILDENAFIL CITRATE 100 MG PO TABS: 100.0000 mg | ORAL_TABLET | Freq: Every day | ORAL | Status: DC | PRN

## 2013-05-04 NOTE — Assessment & Plan Note (Signed)
refilled Protonix.  Continue medication as prescribed for acid reflux.

## 2013-05-04 NOTE — Assessment & Plan Note (Signed)
Trial of Viagra 100mg .  Patient instructed to cut tablets in half for a trial of 5omg.  If desired effect results, continue taking 1/2 pill at least 30 min prior to intercourse.

## 2013-05-04 NOTE — Assessment & Plan Note (Signed)
Obtain fasting labs today CBC, Lipid Profile, TSH, CMP, PSA levels 2/2 abnormal DRE.

## 2013-05-04 NOTE — Assessment & Plan Note (Signed)
refilled Lotrel.  Continue medication as prescribed.

## 2013-05-04 NOTE — Patient Instructions (Signed)
Prescriptions refilled.  Sent prescriptions to  [facility]. Please obtain labs today.  Schedule return appt. In 6 months.  Will call you with lab results.  Come back sooner if needed.   Erectile Dysfunction Erectile dysfunction (ED) is the inability to get a good enough erection to have sexual intercourse. ED may involve:  Inability to get an erection.  Lack of enough hardness to allow penetration.  Loss of the erection before sex is finished.  Premature ejaculation.  Any combination of these problems if they occur more than 25% of the time. CAUSES  Certain drugs, such as:  Pain relievers.  Antihistamines.  Antidepressants.  Blood pressure medicines.  Water pills.  Ulcer medicines.  Muscle relaxants.  Illegal drugs.  Excessive drinking.  Psychological causes, such as:  Anxiety.  Depression.  Sadness.  Exhaustion.  Performance fear.  Stress.  Physical causes, such as:  Artery problems. This may include diabetes, smoking, liver disease, or atherosclerosis.  High blood pressure.  Hormonal problems, such as low testosterone.  Obesity.  Nerve problems. This may include back or pelvic injuries, diabetes, multiple sclerosis, Parkinson's disease, or some surgeries. SYMPTOMS  Inability to get an erection.  Lack of enough hardness to allow penetration.  Loss of the erection before sex is finished.  Premature ejaculation.  Normal erections at some times, but with frequent unsatisfactory episodes.  Orgasms that are not satisfactory in sensation or frequency.  Low sexual satisfaction in either partner because of erection problems.  A curved penis occurring with erection. The curve may cause pain or may be too curved to allow for intercourse.  Never having nighttime erections. DIAGNOSIS Your caregiver can often diagnose this condition by:  Performing a physical exam to find other diseases or specific problems with the penis.  Asking  you detailed questions about the problem.  Performing blood tests to check for diabetes or to measure hormone levels.  Performing urine tests to find other underlying health conditions.  Performing an ultrasound to check for scarring.  Performing a test to check blood flow to the penis.  Doing a sleep study at home to measure nighttime erections. TREATMENT   You may be prescribed medicines by mouth.  You may be given medicine injections into the penis.  You may be prescribed a vacuum pump with a ring.  Penile implant surgery may be performed. You may receive:  An inflatable implant.  A semi-rigid implant.  Blood vessel surgery may be performed. HOME CARE INSTRUCTIONS  Take all medicine as directed by your caregiver. Do not take any other medicines without talking to your caregiver first.  Follow your caregiver's directions for specific treatments as prescribed.  Follow up with your caregiver as directed. Document Released: 09/21/2000 Document Revised: 12/17/2011 Document Reviewed: 01/14/2011 [facility] Patient Information 2014 [place], [place].

## 2013-05-04 NOTE — Assessment & Plan Note (Signed)
Advised patient to obtain sleep study for snoring, daytime sleepiness and fatigue.  Patient declines at this time.  Will revisit at next appointment.

## 2013-05-04 NOTE — Progress Notes (Signed)
Patient ID: Hector Edwards, male   DOB: Dec 20, 1945, 67 y.o.   MRN: [id number]  Patient is a 67 year-old caucasian gentleman who presents to clinic day to establish care and for medication refills. Patient states he has been taking medications as prescribed and his BP has been well-controlled.  Checks BP 2+ times a week at home.  Has been experiencing some erectile dysfunction over the past year.  Endorses he cannot get an erection that is adequate enough for penetration.  Is beginning to affect daily life because he wishes to be able to be intimate.  Is requesting a prescription for Viagra.  Denies chest pain, palpitations, dizziness or syncope.  Denies use of nitroglycerin or hx of CAD.  Patient requests refill of pantoprazole.  Has run out and is experiencing heartburn, especially at night.  Patient also endorses snoring nightly and states he feels tired in the mornings, like he isn't resting well.  Denies waking up at night unable to breathe.  Has expressed these concerns to last PCP who requested patient obtain a sleep study.  Patient declined at that time.  Past Medical History  Diagnosis Date  . GERD (gastroesophageal reflux disease)   . Hyperlipidemia   . Hypertension   . LBP (low back pain)   . Depression    Current Outpatient Prescriptions on File Prior to Visit  Medication Sig Dispense Refill  . aspirin 81 MG tablet Take 81 mg by mouth daily.        . Cholecalciferol 1000 UNITS tablet Take 1,000 Units by mouth daily.        . meloxicam (MOBIC) 15 MG tablet TAKE ONE TABLET BY MOUTH DAILY AS NEEDED FOR PAIN  90 tablet  1   No current facility-administered medications on file prior to visit.   Allergies  Allergen Reactions  . Enalapril Maleate     REACTION: cramps  . Hctz (Hydrochlorothiazide)     SOB  . Tribenzor (Olmesartan-Amlodipine-Hctz) Swelling    Fatigue, CP, edema  . Verapamil     REACTION: Fatigue   Family History  Problem Relation Age of Onset  . Cancer Daughter    breast  . Hypertension Other    History   Social History  . Marital Status: Divorced    Spouse Name: N/A    Number of Children: N/A  . Years of Education: N/A   Social History Main Topics  . Smoking status: Never Smoker   . Smokeless tobacco: Never Used  . Alcohol Use: 3.6 oz/week    6 Cans of beer per week  . Drug Use: No  . Sexually Active: Not Currently   Other Topics Concern  . None   Social History Narrative   Regular exercise- yes, seldom; working physically all the time.       Review of Systems  Constitutional: Positive for malaise/fatigue. Negative for fever, chills and weight loss.  HENT: Negative for hearing loss, ear pain and tinnitus.   Eyes: Negative for blurred vision and double vision.  Respiratory: Negative for cough, hemoptysis, sputum production, shortness of breath and wheezing.   Cardiovascular: Negative for chest pain and palpitations.  Gastrointestinal: Positive for heartburn. Negative for nausea, vomiting, abdominal pain, diarrhea, constipation, blood in stool and melena.  Genitourinary: Negative for dysuria, urgency, frequency and hematuria.  Musculoskeletal: Positive for back pain. Negative for myalgias.  Neurological: Negative for dizziness and headaches.  Psychiatric/Behavioral: Negative for depression and suicidal ideas.   Physical Exam  Constitutional: He is oriented to person, place,  and time and well-developed, well-nourished, and in no distress. No distress.  HENT:  Head: Normocephalic and atraumatic.  Right Ear: External ear normal.  Left Ear: External ear normal.  Nose: Nose normal.  Mouth/Throat: Oropharynx is clear and moist. No oropharyngeal exudate.  Eyes: Conjunctivae and EOM are normal. Pupils are equal, round, and reactive to light.  Neck: Normal range of motion. Neck supple. No thyromegaly present.  Cardiovascular: Normal rate, regular rhythm, normal heart sounds and intact distal pulses.   Pulmonary/Chest: Effort normal and  breath sounds normal. No respiratory distress. He has no wheezes.  Abdominal: Soft. Bowel sounds are normal. He exhibits no distension and no mass. There is no tenderness. There is no rebound and no guarding.  Genitourinary:  DRE: presence of a 1cm nodule on prostate just left of median sulcus.  Lymphadenopathy:    He has no cervical adenopathy.  Neurological: He is alert and oriented to person, place, and time. He has normal reflexes. No cranial nerve deficit.  Skin: Skin is warm and dry. He is not diaphoretic.   Assessment/Plan: (1) HTN -- refilled Lotrel.  Continue medication as prescribed. (2) GERD -- refilled Protonix.  Continue medication as prescribed for acid reflux. (3) Erectile Dysfunction -- Trial of Viagra 100mg .  Patient instructed to cut tablets in half for a trial of 5omg.  If desired effect results, continue taking 1/2 pill at least 30 min prior to intercourse. (4) Preventive Visit -- Obtain fasting labs today CBC, Lipid Profile, TSH, CMP, PSA levels 2/2 abnormal DRE. (5) Snoring -- Advised patient to obtain sleep study for snoring, daytime sleepiness and fatigue.  Patient declines at this time.  Will revisit at next appointment.

## 2013-05-05 ENCOUNTER — Other Ambulatory Visit: Payer: Self-pay | Admitting: Physician Assistant

## 2013-05-05 DIAGNOSIS — N402 Nodular prostate without lower urinary tract symptoms: Secondary | ICD-10-CM

## 2013-05-05 NOTE — Assessment & Plan Note (Signed)
amb referral to urology for evaluation.

## 2013-05-18 ENCOUNTER — Other Ambulatory Visit: Payer: Self-pay | Admitting: Physician Assistant

## 2013-05-18 ENCOUNTER — Other Ambulatory Visit: Payer: Self-pay | Admitting: *Deleted

## 2013-05-18 DIAGNOSIS — I10 Essential (primary) hypertension: Secondary | ICD-10-CM

## 2013-05-18 MED ORDER — AMLODIPINE BESY-BENAZEPRIL HCL 5-10 MG PO CAPS: ORAL_CAPSULE | ORAL | Status: DC

## 2013-08-06 ENCOUNTER — Other Ambulatory Visit: Payer: Self-pay | Admitting: Physician Assistant

## 2013-08-06 ENCOUNTER — Other Ambulatory Visit: Payer: Self-pay | Admitting: *Deleted

## 2013-08-06 DIAGNOSIS — I10 Essential (primary) hypertension: Secondary | ICD-10-CM

## 2013-08-06 MED ORDER — AMLODIPINE BESY-BENAZEPRIL HCL 5-10 MG PO CAPS: ORAL_CAPSULE | ORAL | Status: DC

## 2013-08-06 NOTE — Progress Notes (Signed)
90-day supply requested; Rx request to pharmacy/SLS

## 2013-08-07 NOTE — Telephone Encounter (Signed)
Rx request to pharmacy/SLS  

## 2013-11-02 ENCOUNTER — Encounter: Payer: Self-pay | Admitting: Physician Assistant

## 2013-11-02 ENCOUNTER — Other Ambulatory Visit: Payer: Self-pay | Admitting: Physician Assistant

## 2013-11-02 ENCOUNTER — Ambulatory Visit (INDEPENDENT_AMBULATORY_CARE_PROVIDER_SITE_OTHER): Payer: Medicare HMO | Admitting: Physician Assistant

## 2013-11-02 VITALS — BP 124/86 | HR 90 | Temp 98.2°F | Resp 20 | Ht 75.0 in | Wt 286.5 lb

## 2013-11-02 DIAGNOSIS — E785 Hyperlipidemia, unspecified: Secondary | ICD-10-CM

## 2013-11-02 DIAGNOSIS — Z23 Encounter for immunization: Secondary | ICD-10-CM

## 2013-11-02 LAB — LIPID PANEL
CHOL/HDL RATIO: 4.7 ratio
Cholesterol: 179 mg/dL (ref 0–200)
HDL: 38 mg/dL — ABNORMAL LOW (ref >39)
LDL CALC: 125 mg/dL — AB (ref 0–99)
Triglycerides: 81 mg/dL (ref <150)
VLDL: 16 mg/dL (ref 0–40)

## 2013-11-02 NOTE — Progress Notes (Signed)
Patient presents to clinic today for 15-month follow-up of elevated cholesterol.  Patient is fasting for labs.  At last visit, annual labs were obtained revealing LDL cholesterol in the 130 range.  Discussed medications, but patient wished to attempt a trial of diet and exercise.  Patient endorses trying to monitor his diet.  Endorses walking on an elliptical machine several times per week.  Patient denies new concerns at today's visit.     Past Medical History  Diagnosis Date  . GERD (gastroesophageal reflux disease)   . Hyperlipidemia   . Hypertension   . LBP (low back pain)   . Depression     Current Outpatient Prescriptions on File Prior to Visit  Medication Sig Dispense Refill  . amLODipine-benazepril (LOTREL) 5-10 MG per capsule TAKE 2 CAPSULES BY MOUTH EVERY DAY  180 capsule  0  . aspirin 81 MG tablet Take 81 mg by mouth daily.        . Cholecalciferol 1000 UNITS tablet Take 1,000 Units by mouth daily.        . meloxicam (MOBIC) 15 MG tablet TAKE ONE TABLET BY MOUTH DAILY AS NEEDED FOR PAIN  90 tablet  1  . pantoprazole (PROTONIX) 40 MG tablet TAKE 1 TABLET BY MOUTH EVERY DAY  90 tablet  0  . Red Yeast Rice Extract (RED YEAST RICE PO) Take 2 each by mouth daily.      . sildenafil (VIAGRA) 100 MG tablet Take 1 tablet (100 mg total) by mouth daily as needed for erectile dysfunction. Try taking 1/2 pill first and see if it gives desired effect.  5 tablet  11  . traMADol (ULTRAM) 50 MG tablet Take 1-2 tablets (50-100 mg total) by mouth 2 (two) times daily as needed. Maximum dose= 8 tablets per day  120 tablet  1   No current facility-administered medications on file prior to visit.    Allergies  Allergen Reactions  . Enalapril Maleate     REACTION: cramps  . Hctz [Hydrochlorothiazide]     SOB  . Tribenzor [Olmesartan-Amlodipine-Hctz] Swelling    Fatigue, CP, edema  . Verapamil     REACTION: Fatigue    Family History  Problem Relation Age of Onset  . Cancer Daughter    breast  . Hypertension Other     History   Social History  . Marital Status: Divorced    Spouse Name: N/A    Number of Children: N/A  . Years of Education: N/A   Social History Main Topics  . Smoking status: Never Smoker   . Smokeless tobacco: Never Used  . Alcohol Use: 3.6 oz/week    6 Cans of beer per week  . Drug Use: No  . Sexual Activity: Not Currently   Other Topics Concern  . None   Social History Narrative   Regular exercise- yes, seldom; working physically all the time.   Review of Systems - See HPI.  All other ROS are negative.  There were no vitals filed for this visit.  Physical Exam  Vitals reviewed. Constitutional: He is oriented to person, place, and time and well-developed, well-nourished, and in no distress.  HENT:  Head: Normocephalic and atraumatic.  Right Ear: External ear normal.  Left Ear: External ear normal.  Nose: Nose normal.  Mouth/Throat: Oropharynx is clear and moist. No oropharyngeal exudate.  TM within normal limits bilaterally.  Eyes: Conjunctivae are normal. Pupils are equal, round, and reactive to light.  Neck: Neck supple.  Cardiovascular: Normal rate, regular  rhythm and normal heart sounds.   Pulmonary/Chest: Effort normal and breath sounds normal. No respiratory distress. He has no wheezes. He has no rales. He exhibits no tenderness.  Lymphadenopathy:    He has no cervical adenopathy.  Neurological: He is alert and oriented to person, place, and time.  Skin: Skin is warm and dry. No rash noted.  Psychiatric: Affect normal.    No results found for this or any previous visit (from the past 2160 hour(s)).  Assessment/Plan: No problem-specific assessment & plan notes found for this encounter.

## 2013-11-02 NOTE — Progress Notes (Signed)
Pre visit review using our clinic review tool, if applicable. No additional management support is needed unless otherwise documented below in the visit note/SLS  

## 2013-11-02 NOTE — Patient Instructions (Signed)
Please obtain labs.  I will call you with your results.  We will start a medication if indicated.  Please continue other medications as prescribed.  Follow-up in 6 months for annual examination.  Please return sooner if you need anything.

## 2013-11-02 NOTE — Assessment & Plan Note (Signed)
Will repeat fasting lipid panel.  Will begin statin therapy if indicated.  Otherwise, continue TLC and begin a krill oil supplement daily.  Return in 6 months for annual examination.

## 2013-11-02 NOTE — Addendum Note (Signed)
Addended by: Rockwell Germany on: 11/02/2013 11:31 AM   Modules accepted: Orders

## 2013-11-02 NOTE — Telephone Encounter (Signed)
Rx request to pharmacy/SLS  

## 2013-11-13 ENCOUNTER — Other Ambulatory Visit: Payer: Self-pay | Admitting: Physician Assistant

## 2013-11-23 ENCOUNTER — Telehealth: Payer: Self-pay | Admitting: *Deleted

## 2013-11-23 NOTE — Telephone Encounter (Signed)
Received letter from [facility] stating that pt had been given limited supply of BP medication; called  [phone] to initiate PA/SLS Informed will receive answer w/i 24-48 business hours.

## 2013-11-25 NOTE — Telephone Encounter (Signed)
Amlodipine besylate benazepril hydrochloride has been approved by [place] from 10-08-2013 to 10-07-2014

## 2014-05-03 ENCOUNTER — Encounter: Payer: Medicare HMO | Admitting: Physician Assistant

## 2014-05-05 ENCOUNTER — Encounter: Payer: Self-pay | Admitting: Internal Medicine

## 2014-05-05 ENCOUNTER — Encounter: Payer: Self-pay | Admitting: Physician Assistant

## 2014-05-05 ENCOUNTER — Ambulatory Visit (INDEPENDENT_AMBULATORY_CARE_PROVIDER_SITE_OTHER): Payer: Medicare HMO | Admitting: Physician Assistant

## 2014-05-05 VITALS — BP 128/80 | HR 87 | Temp 97.7°F | Resp 18 | Ht 75.0 in | Wt 285.5 lb

## 2014-05-05 DIAGNOSIS — E785 Hyperlipidemia, unspecified: Secondary | ICD-10-CM

## 2014-05-05 DIAGNOSIS — Z125 Encounter for screening for malignant neoplasm of prostate: Secondary | ICD-10-CM

## 2014-05-05 DIAGNOSIS — I10 Essential (primary) hypertension: Secondary | ICD-10-CM

## 2014-05-05 DIAGNOSIS — Z1211 Encounter for screening for malignant neoplasm of colon: Secondary | ICD-10-CM

## 2014-05-05 DIAGNOSIS — Z136 Encounter for screening for cardiovascular disorders: Secondary | ICD-10-CM

## 2014-05-05 DIAGNOSIS — N529 Male erectile dysfunction, unspecified: Secondary | ICD-10-CM

## 2014-05-05 DIAGNOSIS — N400 Enlarged prostate without lower urinary tract symptoms: Secondary | ICD-10-CM

## 2014-05-05 DIAGNOSIS — Z0000 Encounter for general adult medical examination without abnormal findings: Secondary | ICD-10-CM

## 2014-05-05 LAB — CBC
HCT: 46.1 % (ref 39.0–52.0)
Hemoglobin: 16.2 g/dL (ref 13.0–17.0)
MCH: 33 pg (ref 26.0–34.0)
MCHC: 35.1 g/dL (ref 30.0–36.0)
MCV: 93.9 fL (ref 78.0–100.0)
Platelets: 208 10*3/uL (ref 150–400)
RBC: 4.91 MIL/uL (ref 4.22–5.81)
RDW: 13.1 % (ref 11.5–15.5)
WBC: 6.9 10*3/uL (ref 4.0–10.5)

## 2014-05-05 LAB — COMPLETE METABOLIC PANEL WITH GFR
ALK PHOS: 62 U/L (ref 39–117)
ALT: 46 U/L (ref 0–53)
AST: 26 U/L (ref 0–37)
Albumin: 4.2 g/dL (ref 3.5–5.2)
BILIRUBIN TOTAL: 0.9 mg/dL (ref 0.2–1.2)
BUN: 13 mg/dL (ref 6–23)
CO2: 29 mEq/L (ref 19–32)
Calcium: 9.3 mg/dL (ref 8.4–10.5)
Chloride: 102 mEq/L (ref 96–112)
Creat: 0.87 mg/dL (ref 0.50–1.35)
GFR, Est African American: >89 mL/min
GFR, Est Non African American: >89 mL/min
Glucose, Bld: 100 mg/dL — ABNORMAL HIGH (ref 70–99)
POTASSIUM: 4.3 meq/L (ref 3.5–5.3)
SODIUM: 138 meq/L (ref 135–145)
TOTAL PROTEIN: 7.2 g/dL (ref 6.0–8.3)

## 2014-05-05 LAB — LIPID PANEL
CHOL/HDL RATIO: 4.8 ratio
Cholesterol: 176 mg/dL (ref 0–200)
HDL: 37 mg/dL — AB (ref >39)
LDL Cholesterol: 117 mg/dL — ABNORMAL HIGH (ref 0–99)
TRIGLYCERIDES: 111 mg/dL (ref <150)
VLDL: 22 mg/dL (ref 0–40)

## 2014-05-05 MED ORDER — AMLODIPINE BESY-BENAZEPRIL HCL 5-10 MG PO CAPS: ORAL_CAPSULE | ORAL | Status: DC

## 2014-05-05 MED ORDER — PANTOPRAZOLE SODIUM 40 MG PO TBEC: DELAYED_RELEASE_TABLET | ORAL | Status: DC

## 2014-05-05 NOTE — Progress Notes (Signed)
Subjective:   Patient here for Medicare annual wellness visit and management of other chronic and acute problems.      (1) Hypertension -- Well-controlled previously.  Takes amlodipine-benazepril daily.  BP normal in clinic.  Denies chest pain, palpitations, LH or dizziness.  (2) Hyperlipidemia -- Controlled previously with diet and exercise.  (3) GERD -- Takes Protonix daily with good relief.  Denies stomach pain, nausea or vomiting.   (4) Erectile dysfunction -- doing well on Viagra.  Is endorsing significant Nocturia (5-6 x night).  Endorses some hesitancy.  Denies hematuria, dysuria or urgency.  Denies incontinence.  PSA within normal limits last year. Due for repeat labs.  Risk factors: Age, Hypertension, Hyperlipidemia  Roster of Physicians Providing Medical Care to Patient: Brunetta Jeans, PA-C -- Primary Care  Activities of Daily Living  In your present state of health, do you have any difficulty performing the following activities? Preparing food and eating?: No  Bathing yourself: No  Getting dressed: No  Using the toilet:No  Moving around from place to place: No  In the past year have you fallen or had a near fall?:No     Home Safety: Has smoke detector and wears seat belts. Has firearms in home.  Kept in gun safe. No excess sun exposure.  Diet and Exercise  Current exercise habits: walks > 1 mile daily. Dietary issues discussed: healthy diet. Well-balanced.  Depression Screen  (Note: if answer to either of the following is "Yes", then a more complete depression screening is indicated)  Q1: Over the past two weeks, have you felt down, depressed or hopeless?no  Q2: Over the past two weeks, have you felt little interest or pleasure in doing things? no   The following portions of the patient's history were reviewed and updated as appropriate: allergies, current medications, past family history, past medical history, past social history, past surgical history and problem  list.   Review of Systems  Constitutional: Negative for fever, weight loss and malaise/fatigue.  HENT: Negative for ear discharge, ear pain, hearing loss and tinnitus.   Eyes: Negative for blurred vision, double vision, photophobia and pain.  Respiratory: Negative for cough and shortness of breath.   Cardiovascular: Negative for chest pain and palpitations.  Gastrointestinal: Negative for heartburn, nausea, vomiting, abdominal pain, diarrhea, constipation, blood in stool and melena.  Genitourinary: Positive for frequency. Negative for dysuria, urgency, hematuria and flank pain.       Nocturia x 5-6. + ED  Neurological: Negative for dizziness, loss of consciousness and headaches.  Endo/Heme/Allergies: Negative for environmental allergies.  Psychiatric/Behavioral: Negative for depression, suicidal ideas, hallucinations and substance abuse. The patient is not nervous/anxious.    Objective:   Vision: R 20/25, L 20/20, B 20/20 Hearing: Patient can hear a forced whisper from across the room. Body mass index: Body mass index is 35.69 kg/(m^2). Cognitive Impairment Assessment: cognition, memory and judgment appear normal.   Physical Exam  Vitals reviewed. Constitutional: He is oriented to person, place, and time and well-developed, well-nourished, and in no distress.  HENT:  Head: Normocephalic and atraumatic.  Right Ear: External ear normal.  Left Ear: External ear normal.  Nose: Nose normal.  Mouth/Throat: Oropharynx is clear and moist. No oropharyngeal exudate.  TM within normal limits bilaterally.  Eyes: Conjunctivae and EOM are normal. Pupils are equal, round, and reactive to light.  Neck: Normal range of motion. Neck supple. No thyromegaly present.  Cardiovascular: Normal rate, regular rhythm and intact distal pulses.   Pulmonary/Chest: Effort  normal and breath sounds normal. No respiratory distress. He has no wheezes. He has no rales. He exhibits no tenderness.  Abdominal: Soft.  Bowel sounds are normal. He exhibits no distension and no mass. There is no tenderness. There is no rebound and no guarding.  Genitourinary: Rectum normal, testes/scrotum normal and penis normal. Prostate is enlarged. Prostate is not tender.  Lymphadenopathy:    He has no cervical adenopathy.  Neurological: He is alert and oriented to person, place, and time.  Skin: Skin is warm and dry. No rash noted.  Psychiatric: Affect normal.   Assessment:   (1) Medicare wellness utd on preventive parameters  (2) Hypertension (3) Hyperlipidemia (4) BPH/ED (5) Prostate Cancer Screening (6) Colon Cancer Screening (7) Screening for Ischemic Heart Disease  Plan:    (1) During the course of the visit the patient was educated and counseled about appropriate screening and preventive services including: Fall prevention, Diabetes screening, Nutrition counseling.  Will obtain fasting labs at today's visit.  Patient UTD on immunizations.  Prevnar given in 1/15.  Due for Pneumovax 1/16.  (2) Well-controlled. Medications refilled.  Will obtain CMP.  (3) Will obtain fasting lipid panel. Continue Krill oil and Red yeast Rice supplementation.  (4) DRE reveals mild BPH.  Will obtain PSA level.  Will begin regimen based on lab results.  (5) Will obtain PSA at today's visit.  DRE with no concerning nodules. Hemoccult negative.  (6) Referral placed to GI for screening colonoscopy.  (7) EKG reveals NSR.  Continue 81 mg ASA daily.  Patient Instructions (the written plan) was given to the patient.

## 2014-05-05 NOTE — Progress Notes (Signed)
Pre visit review using our clinic review tool, if applicable. No additional management support is needed unless otherwise documented below in the visit note/SLS  

## 2014-05-05 NOTE — Patient Instructions (Signed)
Please obtain labs.  I will call you with your results.  We will start a medication for enlarged prostate once we have your PSA level back.  Continue current medicine regimen. We will base follow-up on your lab results.  You will be contacted for a consult appointment for a screening colonoscopy.    Benign Prostatic Hyperplasia An enlarged prostate (benign prostatic hyperplasia) is common in older men. You may experience the following:  Weak urine stream.  Dribbling.  Feeling like the bladder has not emptied completely.  Difficulty starting urination.  Getting up frequently at night to urinate.  Urinating more frequently during the day. HOME CARE INSTRUCTIONS  Monitor your prostatic hyperplasia for any changes. The following actions may help to alleviate any discomfort you are experiencing:  Give yourself time when you urinate.  Stay away from alcohol.  Avoid beverages containing caffeine, such as coffee, tea, and colas, because they can make the problem worse.  Avoid decongestants, antihistamines, and some prescription medicines that can make the problem worse.  Follow up with your health care provider for further treatment as recommended. SEEK MEDICAL CARE IF:  You are experiencing progressive difficulty voiding.  Your urine stream is progressively getting narrower.  You are awaking from sleep with the urge to void more frequently.  You are constantly feeling the need to void.  You experience loss of urine, especially in small amounts. SEEK IMMEDIATE MEDICAL CARE IF:   You develop increased pain with urination or are unable to urinate.  You develop severe abdominal pain, vomiting, a high fever, or fainting.  You develop back pain or blood in your urine. MAKE SURE YOU:   Understand these instructions.  Will watch your condition.  Will get help right away if you are not doing well or get worse. Document Released: 09/24/2005 Document Revised: 05/27/2013 Document  Reviewed: 02/24/2013 [facility] Patient Information 2015 [place], [place]. This information is not intended to replace advice given to you by your health care provider. Make sure you discuss any questions you have with your health care provider.

## 2014-05-06 LAB — URINALYSIS, MICROSCOPIC ONLY
Bacteria, UA: NONE SEEN
CASTS: NONE SEEN
Crystals: NONE SEEN
SQUAMOUS EPITHELIAL / LPF: NONE SEEN

## 2014-05-06 LAB — URINALYSIS, ROUTINE W REFLEX MICROSCOPIC
Bilirubin Urine: NEGATIVE
Glucose, UA: NEGATIVE mg/dL
Hgb urine dipstick: NEGATIVE
Ketones, ur: NEGATIVE mg/dL
LEUKOCYTES UA: NEGATIVE
NITRITE: NEGATIVE
PH: 5.5 (ref 5.0–8.0)
Protein, ur: NEGATIVE mg/dL
SPECIFIC GRAVITY, URINE: 1.03 (ref 1.005–1.030)
Urobilinogen, UA: 1 mg/dL (ref 0.0–1.0)

## 2014-07-15 ENCOUNTER — Encounter: Payer: Medicare HMO | Admitting: Internal Medicine

## 2014-11-26 ENCOUNTER — Other Ambulatory Visit: Payer: Self-pay | Admitting: *Deleted

## 2014-11-26 MED ORDER — PANTOPRAZOLE SODIUM 40 MG PO TBEC: DELAYED_RELEASE_TABLET | ORAL | Status: DC

## 2014-11-26 MED ORDER — AMLODIPINE BESY-BENAZEPRIL HCL 5-10 MG PO CAPS: ORAL_CAPSULE | ORAL | Status: DC

## 2014-11-26 NOTE — Telephone Encounter (Signed)
Rx request to pharmacy/SLS Requested drug refills are authorized, however, the patient needs further evaluation and/or laboratory testing before further refills are given. Ask him to make an appointment for this.  

## 2014-12-02 ENCOUNTER — Other Ambulatory Visit: Payer: Self-pay | Admitting: Physician Assistant

## 2015-01-17 ENCOUNTER — Telehealth: Payer: Self-pay | Admitting: *Deleted

## 2015-01-17 MED ORDER — AMLODIPINE BESY-BENAZEPRIL HCL 10-20 MG PO CAPS: 1.0000 | ORAL_CAPSULE | Freq: Every day | ORAL | Status: DC

## 2015-01-17 NOTE — Telephone Encounter (Signed)
Received fax from [place] stating they will only pay for 30 capsules per 30 days. Pt currently takes 2 per day. [place] Falling would like to know if taking one cap per day would be as effective for pt? Please advise. JG//CMA

## 2015-01-17 NOTE — Addendum Note (Signed)
Addended by: Murtis Sink A on: 01/17/2015 03:08 PM   Modules accepted: Orders, Medications

## 2015-01-17 NOTE — Telephone Encounter (Signed)
Patient overdue for follow-up.  Can continue prior auth -- Can try to send in 10-20 mg capsule of the Lotrel to his pharmacy.  Take 1 tablet by mouth daily.  Will not give any further refills until he is seen.

## 2015-01-17 NOTE — Telephone Encounter (Signed)
New dosage e-scribed to pharmacy. Called and lm for pt. JG//CMA

## 2015-01-17 NOTE — Telephone Encounter (Signed)
Prior auth for Amlodipine initiated. Awaiting determination. JG//CMA

## 2015-01-18 ENCOUNTER — Ambulatory Visit (INDEPENDENT_AMBULATORY_CARE_PROVIDER_SITE_OTHER): Payer: Medicare HMO | Admitting: Physician Assistant

## 2015-01-18 ENCOUNTER — Encounter: Payer: Self-pay | Admitting: Physician Assistant

## 2015-01-18 VITALS — BP 156/91 | HR 101 | Temp 98.4°F | Resp 16 | Ht 75.0 in | Wt 286.2 lb

## 2015-01-18 DIAGNOSIS — J019 Acute sinusitis, unspecified: Secondary | ICD-10-CM

## 2015-01-18 DIAGNOSIS — B9689 Other specified bacterial agents as the cause of diseases classified elsewhere: Secondary | ICD-10-CM | POA: Insufficient documentation

## 2015-01-18 MED ORDER — DOXYCYCLINE HYCLATE 100 MG PO CAPS: 100.0000 mg | ORAL_CAPSULE | Freq: Two times a day (BID) | ORAL | Status: DC

## 2015-01-18 NOTE — Progress Notes (Signed)
Pre visit review using our clinic review tool, if applicable. No additional management support is needed unless otherwise documented below in the visit note/SLS  

## 2015-01-18 NOTE — Assessment & Plan Note (Signed)
Rx Doxycycline.  Increase fluids.  Rest.  Saline nasal spray.  Probiotic.  Mucinex as directed.  Humidifier in bedroom. Continue chronic medication regimen.  Call or return to clinic if symptoms are not improving.

## 2015-01-18 NOTE — Patient Instructions (Signed)
Please take antibiotic as directed.  Increase fluid intake.  Use Saline nasal spray.  Take a daily multivitamin. Continue Mucinex as directed.  Place a humidifier in the bedroom.  Please call or return clinic if symptoms are not improving.  Sinusitis Sinusitis is redness, soreness, and swelling (inflammation) of the paranasal sinuses. Paranasal sinuses are air pockets within the bones of your face (beneath the eyes, the middle of the forehead, or above the eyes). In healthy paranasal sinuses, mucus is able to drain out, and air is able to circulate through them by way of your nose. However, when your paranasal sinuses are inflamed, mucus and air can become trapped. This can allow bacteria and other germs to grow and cause infection. Sinusitis can develop quickly and last only a short time (acute) or continue over a long period (chronic). Sinusitis that lasts for more than 12 weeks is considered chronic.  CAUSES  Causes of sinusitis include:  Allergies.  Structural abnormalities, such as displacement of the cartilage that separates your nostrils (deviated septum), which can decrease the air flow through your nose and sinuses and affect sinus drainage.  Functional abnormalities, such as when the small hairs (cilia) that line your sinuses and help remove mucus do not work properly or are not present. SYMPTOMS  Symptoms of acute and chronic sinusitis are the same. The primary symptoms are pain and pressure around the affected sinuses. Other symptoms include:  Upper toothache.  Earache.  Headache.  Bad breath.  Decreased sense of smell and taste.  A cough, which worsens when you are lying flat.  Fatigue.  Fever.  Thick drainage from your nose, which often is green and may contain pus (purulent).  Swelling and warmth over the affected sinuses. DIAGNOSIS  Your caregiver will perform a physical exam. During the exam, your caregiver may:  Look in your nose for signs of abnormal growths  in your nostrils (nasal polyps).  Tap over the affected sinus to check for signs of infection.  View the inside of your sinuses (endoscopy) with a special imaging device with a light attached (endoscope), which is inserted into your sinuses. If your caregiver suspects that you have chronic sinusitis, one or more of the following tests may be recommended:  Allergy tests.  Nasal culture A sample of mucus is taken from your nose and sent to a lab and screened for bacteria.  Nasal cytology A sample of mucus is taken from your nose and examined by your caregiver to determine if your sinusitis is related to an allergy. TREATMENT  Most cases of acute sinusitis are related to a viral infection and will resolve on their own within 10 days. Sometimes medicines are prescribed to help relieve symptoms (pain medicine, decongestants, nasal steroid sprays, or saline sprays).  However, for sinusitis related to a bacterial infection, your caregiver will prescribe antibiotic medicines. These are medicines that will help kill the bacteria causing the infection.  Rarely, sinusitis is caused by a fungal infection. In theses cases, your caregiver will prescribe antifungal medicine. For some cases of chronic sinusitis, surgery is needed. Generally, these are cases in which sinusitis recurs more than 3 times per year, despite other treatments. HOME CARE INSTRUCTIONS   Drink plenty of water. Water helps thin the mucus so your sinuses can drain more easily.  Use a humidifier.  Inhale steam 3 to 4 times a day (for example, sit in the bathroom with the shower running).  Apply a warm, moist washcloth to your face 3 to   4 times a day, or as directed by your caregiver.  Use saline nasal sprays to help moisten and clean your sinuses.  Take over-the-counter or prescription medicines for pain, discomfort, or fever only as directed by your caregiver. SEEK IMMEDIATE MEDICAL CARE IF:  You have increasing pain or severe  headaches.  You have nausea, vomiting, or drowsiness.  You have swelling around your face.  You have vision problems.  You have a stiff neck.  You have difficulty breathing. MAKE SURE YOU:   Understand these instructions.  Will watch your condition.  Will get help right away if you are not doing well or get worse. Document Released: 09/24/2005 Document Revised: 12/17/2011 Document Reviewed: 10/09/2011 [facility] Patient Information 2014 [place], [place].

## 2015-01-18 NOTE — Progress Notes (Signed)
History of Present Illness: Hector Edwards is a 69 y.o. male who present to the clinic today complaining of sinus pressure, sinus pain and nasal congestion x 2 weeks. Patient endorses productive cough and thick PND.  Patient denies chest pain, fever or SOB.   History: Past Medical History  Diagnosis Date  . GERD (gastroesophageal reflux disease)   . Hyperlipidemia   . Hypertension   . LBP (low back pain)   . Depression     Current outpatient prescriptions:  .  amLODipine-benazepril (LOTREL) 10-20 MG per capsule, Take 1 capsule by mouth daily., Disp: 30 capsule, Rfl: 0 .  aspirin 81 MG tablet, Take 81 mg by mouth daily.  , Disp: , Rfl:  .  Cholecalciferol 1000 UNITS tablet, Take 1,000 Units by mouth daily.  , Disp: , Rfl:  .  meloxicam (MOBIC) 15 MG tablet, TAKE ONE TABLET BY MOUTH DAILY AS NEEDED FOR PAIN, Disp: 90 tablet, Rfl: 1 .  pantoprazole (PROTONIX) 40 MG tablet, TAKE 1 TABLET BY MOUTH EVERY DAY, Disp: 90 tablet, Rfl: 0 .  sildenafil (VIAGRA) 100 MG tablet, Take 1 tablet (100 mg total) by mouth daily as needed for erectile dysfunction. Try taking 1/2 pill first and see if it gives desired effect., Disp: 5 tablet, Rfl: 11 .  doxycycline (VIBRAMYCIN) 100 MG capsule, Take 1 capsule (100 mg total) by mouth 2 (two) times daily., Disp: 14 capsule, Rfl: 0 .  Krill Oil 1000 MG CAPS, Take 1 each by mouth daily., Disp: , Rfl:  .  Red Yeast Rice Extract (RED YEAST RICE PO), Take 2 each by mouth daily., Disp: , Rfl:  Allergies  Allergen Reactions  . Enalapril Maleate     REACTION: cramps  . Hctz [Hydrochlorothiazide]     SOB  . Tribenzor [Olmesartan-Amlodipine-Hctz] Swelling    Fatigue, CP, edema  . Verapamil     REACTION: Fatigue   Family History  Problem Relation Age of Onset  . Cancer Daughter     breast  . Hypertension Other   . Pancreatic cancer Brother     Deceased   History   Social History  . Marital Status: Divorced    Spouse Name: N/A  . Number of Children:  N/A  . Years of Education: N/A   Social History Main Topics  . Smoking status: Never Smoker   . Smokeless tobacco: Never Used  . Alcohol Use: 3.6 oz/week    6 Cans of beer per week  . Drug Use: No  . Sexual Activity: Not Currently   Other Topics Concern  . None   Social History Narrative   Regular exercise- yes, seldom; working physically all the time.    Review of Systems: See HPI.  All other ROS are negative.  Physical Examination: BP 156/91 mmHg  Pulse 101  Temp(Src) 98.4 F (36.9 C) (Oral)  Resp 16  Ht 6\' 3"  (1.905 m)  Wt 286 lb 4 oz (129.842 kg)  BMI 35.78 kg/m2  SpO2 98%  General appearance: alert, cooperative and appears stated age Head: Normocephalic, without obvious abnormality, atraumatic, sinuses tender to percussion Eyes: conjunctivae/corneas clear. PERRL, EOM's intact. Fundi benign. Ears: normal TM's and external ear canals both ears Nose: moderate congestion, turbinates swollen, sinus tenderness bilateral Throat: lips, mucosa, and tongue normal; teeth and gums normal Neck: no adenopathy, no carotid bruit, no JVD, supple, symmetrical, trachea midline and thyroid not enlarged, symmetric, no tenderness/mass/nodules Lungs: clear to auscultation bilaterally Chest wall: no tenderness   Assessment/Plan: Acute  bacterial sinusitis Rx Doxycycline.  Increase fluids.  Rest.  Saline nasal spray.  Probiotic.  Mucinex as directed.  Humidifier in bedroom. Continue chronic medication regimen.  Call or return to clinic if symptoms are not improving.

## 2015-04-01 ENCOUNTER — Telehealth: Payer: Self-pay | Admitting: Physician Assistant

## 2015-04-01 MED ORDER — AMLODIPINE BESY-BENAZEPRIL HCL 10-20 MG PO CAPS: 1.0000 | ORAL_CAPSULE | Freq: Every day | ORAL | Status: DC

## 2015-04-01 MED ORDER — PANTOPRAZOLE SODIUM 40 MG PO TBEC: DELAYED_RELEASE_TABLET | ORAL | Status: DC

## 2015-04-01 NOTE — Telephone Encounter (Signed)
Done. Patient will need OV for follow-up before additional refills will be given.

## 2015-04-01 NOTE — Telephone Encounter (Signed)
Informed Monica at pharmacy of refill. She states that she will tell patient that he needs an appointment before further refills can be given.

## 2015-04-01 NOTE — Telephone Encounter (Signed)
Caller name: monica Relation to pt: Call back number: [phone] Pharmacy: [facility]  Reason for call:   Pantoprazole 40mg  and amlodipine. Requesting 90 day supply of both. Patient is at pharmacy now.

## 2015-06-17 ENCOUNTER — Telehealth: Payer: Self-pay | Admitting: Behavioral Health

## 2015-06-17 NOTE — Telephone Encounter (Signed)
Spoke with patient and confirmed appointment for 06/20/15 at 8:45 AM. No questions or concerns prior to the end of call.

## 2015-06-20 ENCOUNTER — Ambulatory Visit (INDEPENDENT_AMBULATORY_CARE_PROVIDER_SITE_OTHER): Payer: Medicare HMO

## 2015-06-20 VITALS — BP 132/80 | HR 92 | Temp 98.1°F | Ht 76.0 in | Wt 286.0 lb

## 2015-06-20 DIAGNOSIS — Z0000 Encounter for general adult medical examination without abnormal findings: Secondary | ICD-10-CM | POA: Diagnosis not present

## 2015-06-20 DIAGNOSIS — Z1211 Encounter for screening for malignant neoplasm of colon: Secondary | ICD-10-CM | POA: Diagnosis not present

## 2015-06-20 DIAGNOSIS — Z23 Encounter for immunization: Secondary | ICD-10-CM | POA: Diagnosis not present

## 2015-06-20 NOTE — Patient Instructions (Addendum)
Schedule colonoscopy.  Increase physical activity.   Eat snacks and meal rich whole grains, fruit and vegetables, and lean meats (not fried).    Follow up with Hector Aquas, PA-C for CPE.    Colonoscopy A colonoscopy is an exam to look at the entire large intestine (colon). This exam can help find problems such as tumors, polyps, inflammation, and areas of bleeding. The exam takes about 1 hour.  LET [facility] CARE PROVIDER KNOW ABOUT:   Any allergies you have.  All medicines you are taking, including vitamins, herbs, eye drops, creams, and over-the-counter medicines.  Previous problems you or members of your family have had with the use of anesthetics.  Any blood disorders you have.  Previous surgeries you have had.  Medical conditions you have. RISKS AND COMPLICATIONS  Generally, this is a safe procedure. However, as with any procedure, complications can occur. Possible complications include:  Bleeding.  Tearing or rupture of the colon wall.  Reaction to medicines given during the exam.  Infection (rare). BEFORE THE PROCEDURE   Ask your health care provider about changing or stopping your regular medicines.  You may be prescribed an oral bowel prep. This involves drinking a large amount of medicated liquid, starting the day before your procedure. The liquid will cause you to have multiple loose stools until your stool is almost clear or light green. This cleans out your colon in preparation for the procedure.  Do not eat or drink anything else once you have started the bowel prep, unless your health care provider tells you it is safe to do so.  Arrange for someone to drive you home after the procedure. PROCEDURE   You will be given medicine to help you relax (sedative).  You will lie on your side with your knees bent.  A long, flexible tube with a light and camera on the end (colonoscope) will be inserted through the rectum and into the colon. The camera sends video  back to a computer screen as it moves through the colon. The colonoscope also releases carbon dioxide gas to inflate the colon. This helps your health care provider see the area better.  During the exam, your health care provider may take a small tissue sample (biopsy) to be examined under a microscope if any abnormalities are found.  The exam is finished when the entire colon has been viewed. AFTER THE PROCEDURE   Do not drive for 24 hours after the exam.  You may have a small amount of blood in your stool.  You may pass moderate amounts of gas and have mild abdominal cramping or bloating. This is caused by the gas used to inflate your colon during the exam.  Ask when your test results will be ready and how you will get your results. Make sure you get your test results. Document Released: 09/21/2000 Document Revised: 07/15/2013 Document Reviewed: 06/01/2013 [facility] Patient Information 2015 [place], [place]. This information is not intended to replace advice given to you by your health care provider. Make sure you discuss any questions you have with your health care provider.  Health Maintenance A healthy lifestyle and preventative care can promote health and wellness.  Maintain regular health, dental, and eye exams.  Eat a healthy diet. Foods like vegetables, fruits, whole grains, low-fat dairy products, and lean protein foods contain the nutrients you need and are low in calories. Decrease your intake of foods high in solid fats, added sugars, and salt. Get information about a proper diet from  your health care provider, if necessary.  Regular physical exercise is one of the most important things you can do for your health. Most adults should get at least 150 minutes of moderate-intensity exercise (any activity that increases your heart rate and causes you to sweat) each week. In addition, most adults need muscle-strengthening exercises on 2 or more days a week.   Maintain a healthy  weight. The body mass index (BMI) is a screening tool to identify possible weight problems. It provides an estimate of body fat based on height and weight. Your health care provider can find your BMI and can help you achieve or maintain a healthy weight. For males 20 years and older:  A BMI below 18.5 is considered underweight.  A BMI of 18.5 to 24.9 is normal.  A BMI of 25 to 29.9 is considered overweight.  A BMI of 30 and above is considered obese.  Maintain normal blood lipids and cholesterol by exercising and minimizing your intake of saturated fat. Eat a balanced diet with plenty of fruits and vegetables. Blood tests for lipids and cholesterol should begin at age 83 and be repeated every 5 years. If your lipid or cholesterol levels are high, you are over age 16, or you are at high risk for heart disease, you may need your cholesterol levels checked more frequently.Ongoing high lipid and cholesterol levels should be treated with medicines if diet and exercise are not working.  If you smoke, find out from your health care provider how to quit. If you do not use tobacco, do not start.  Lung cancer screening is recommended for adults aged 33-80 years who are at high risk for developing lung cancer because of a history of smoking. A yearly low-dose CT scan of the lungs is recommended for people who have at least a 30-pack-year history of smoking and are current smokers or have quit within the past 15 years. A pack year of smoking is smoking an average of 1 pack of cigarettes a day for 1 year (for example, a 30-pack-year history of smoking could mean smoking 1 pack a day for 30 years or 2 packs a day for 15 years). Yearly screening should continue until the smoker has stopped smoking for at least 15 years. Yearly screening should be stopped for people who develop a health problem that would prevent them from having lung cancer treatment.  If you choose to drink alcohol, do not have more than 2 drinks  per day. One drink is considered to be 12 oz (360 mL) of beer, 5 oz (150 mL) of wine, or 1.5 oz (45 mL) of liquor.  Avoid the use of street drugs. Do not share needles with anyone. Ask for help if you need support or instructions about stopping the use of drugs.  High blood pressure causes heart disease and increases the risk of stroke. Blood pressure should be checked at least every 1-2 years. Ongoing high blood pressure should be treated with medicines if weight loss and exercise are not effective.  If you are 46-63 years old, ask your health care provider if you should take aspirin to prevent heart disease.  Diabetes screening involves taking a blood sample to check your fasting blood sugar level. This should be done once every 3 years after age 24 if you are at a normal weight and without risk factors for diabetes. Testing should be considered at a younger age or be carried out more frequently if you are overweight and have at  least 1 risk factor for diabetes.  Colorectal cancer can be detected and often prevented. Most routine colorectal cancer screening begins at the age of 10 and continues through age 49. However, your health care provider may recommend screening at an earlier age if you have risk factors for colon cancer. On a yearly basis, your health care provider may provide home test kits to check for hidden blood in the stool. A small camera at the end of a tube may be used to directly examine the colon (sigmoidoscopy or colonoscopy) to detect the earliest forms of colorectal cancer. Talk to your health care provider about this at age 40 when routine screening begins. A direct exam of the colon should be repeated every 5-10 years through age 30, unless early forms of precancerous polyps or small growths are found.  People who are at an increased risk for hepatitis B should be screened for this virus. You are considered at high risk for hepatitis B if:  You were born in a country where  hepatitis B occurs often. Talk with your health care provider about which countries are considered high risk.  Your parents were born in a high-risk country and you have not received a shot to protect against hepatitis B (hepatitis B vaccine).  You have HIV or AIDS.  You use needles to inject street drugs.  You live with, or have sex with, someone who has hepatitis B.  You are a man who has sex with other men (MSM).  You get hemodialysis treatment.  You take certain medicines for conditions like cancer, organ transplantation, and autoimmune conditions.  Hepatitis C blood testing is recommended for all people born from [birth year] through 1963 and any individual with known risk factors for hepatitis C.  Healthy men should no longer receive prostate-specific antigen (PSA) blood tests as part of routine cancer screening. Talk to your health care provider about prostate cancer screening.  Testicular cancer screening is not recommended for adolescents or adult males who have no symptoms. Screening includes self-exam, a health care provider exam, and other screening tests. Consult with your health care provider about any symptoms you have or any concerns you have about testicular cancer.  Practice safe sex. Use condoms and avoid high-risk sexual practices to reduce the spread of sexually transmitted infections (STIs).  You should be screened for STIs, including gonorrhea and chlamydia if:  You are sexually active and are younger than 24 years.  You are older than 24 years, and your health care provider tells you that you are at risk for this type of infection.  Your sexual activity has changed since you were last screened, and you are at an increased risk for chlamydia or gonorrhea. Ask your health care provider if you are at risk.  If you are at risk of being infected with HIV, it is recommended that you take a prescription medicine daily to prevent HIV infection. This is called pre-exposure  prophylaxis (PrEP). You are considered at risk if:  You are a man who has sex with other men (MSM).  You are a heterosexual man who is sexually active with multiple partners.  You take drugs by injection.  You are sexually active with a partner who has HIV.  Talk with your health care provider about whether you are at high risk of being infected with HIV. If you choose to begin PrEP, you should first be tested for HIV. You should then be tested every 3 months for as long as you  are taking PrEP.  Use sunscreen. Apply sunscreen liberally and repeatedly throughout the day. You should seek shade when your shadow is shorter than you. Protect yourself by wearing long sleeves, pants, a wide-brimmed hat, and sunglasses year round whenever you are outdoors.  Tell your health care provider of new moles or changes in moles, especially if there is a change in shape or color. Also, tell your health care provider if a mole is larger than the size of a pencil eraser.  A one-time screening for abdominal aortic aneurysm (AAA) and surgical repair of large AAAs by ultrasound is recommended for men aged 29-75 years who are current or former smokers.  Stay current with your vaccines (immunizations). Document Released: 03/22/2008 Document Revised: 09/29/2013 Document Reviewed: 02/19/2011 [facility] Patient Information 2015 [place], [place]. This information is not intended to replace advice given to you by your health care provider. Make sure you discuss any questions you have with your health care provider.

## 2015-06-20 NOTE — Progress Notes (Signed)
I have reviewed the Wellness visit performed by Dossie Arbour, RN. Will been on the look out for colonoscopy findings as screening colonoscopy ordered by RN and wellness and co-signed by this provider. Will see patient at physical.

## 2015-06-20 NOTE — Progress Notes (Signed)
Pre visit review using our clinic review tool, if applicable. No additional management support is needed unless otherwise documented below in the visit note. 

## 2015-06-20 NOTE — Progress Notes (Signed)
Subjective:   Hector Edwards is a 69 y.o. male who presents for Medicare Annual/Subsequent preventive examination.  Review of Systems:  No ROS   Sleep patterns:  Mostly sleeps upright in recliner.  Sleep at least 8 hours each night.    Home Safety/Smoke Alarms:  Lives at home alone. Owns a farm with horse, llamas, etc. Smoke alarms present.  Feels safe.   Firearm Safety:  Keeps in safe place.   Seat Belt Safety/Bike Helmet:  Discussed seat belt safety.   Sun Exposure:  No protection used. Education provided.  Does see a dermatologist.  Counseling Eye Exam- Years ago, discussed importance of eye exam.  Dental- Goes routinely, 3-6 months.   Male:  CCS-DUE    PSA- 05/04/13- 1.25   PNA 23 and Flu Vaccine:  Given today.     Objective:    Vitals: BP 132/80 mmHg  Pulse 92  Temp(Src) 98.1 F (36.7 C) (Oral)  Ht 6\' 4"  (1.93 m)  Wt 286 lb (129.729 kg)  BMI 34.83 kg/m2  SpO2 96%  Tobacco History  Smoking status  . Never Smoker   Smokeless tobacco  . Never Used     Counseling given: Yes   Past Medical History  Diagnosis Date  . GERD (gastroesophageal reflux disease)   . Hyperlipidemia   . Hypertension   . LBP (low back pain)   . Depression    Past Surgical History  Procedure Laterality Date  . Shoulder arthroscopy w/ rotator cuff repair    . Knee arthroscopy      Right   Family History  Problem Relation Age of Onset  . Cancer Daughter     breast  . Hypertension Other   . Pancreatic cancer Brother     Deceased  . Throat cancer Brother    History  Sexual Activity  . Sexual Activity: Not Currently    Outpatient Encounter Prescriptions as of 06/20/2015  Medication Sig  . amLODipine-benazepril (LOTREL) 10-20 MG per capsule Take 1 capsule by mouth daily.  . naproxen sodium (ANAPROX) 220 MG tablet Take 220 mg by mouth 2 (two) times daily as needed.  . pantoprazole (PROTONIX) 40 MG tablet TAKE 1 TABLET BY MOUTH EVERY DAY  . Red Yeast Rice Extract (RED YEAST  RICE PO) Take 2 each by mouth daily.  . sildenafil (VIAGRA) 100 MG tablet Take 1 tablet (100 mg total) by mouth daily as needed for erectile dysfunction. Try taking 1/2 pill first and see if it gives desired effect.  . [DISCONTINUED] aspirin 81 MG tablet Take 81 mg by mouth daily.    . [DISCONTINUED] Cholecalciferol 1000 UNITS tablet Take 1,000 Units by mouth daily.    . [DISCONTINUED] doxycycline (VIBRAMYCIN) 100 MG capsule Take 1 capsule (100 mg total) by mouth 2 (two) times daily. (Patient not taking: Reported on 06/20/2015)  . [DISCONTINUED] Krill Oil 1000 MG CAPS Take 1 each by mouth daily.  . [DISCONTINUED] meloxicam (MOBIC) 15 MG tablet TAKE ONE TABLET BY MOUTH DAILY AS NEEDED FOR PAIN (Patient not taking: Reported on 06/20/2015)   No facility-administered encounter medications on file as of 06/20/2015.    Activities of Daily Living In your present state of health, do you have any difficulty performing the following activities: 06/20/2015  Hearing? Y  Vision? N  Difficulty concentrating or making decisions? N  Walking or climbing stairs? N  Dressing or bathing? N  Doing errands, shopping? N  Preparing Food and eating ? N  Using the Toilet? N  In the past six months, have you accidently leaked urine? N  Do you have problems with loss of bowel control? N  Managing your Medications? N  Managing your Finances? N  Housekeeping or managing your Housekeeping? N    Patient Care Team: Brunetta Jeans, PA-C as PCP - General (Physician Assistant) Gatha Mayer, MD as Consulting Physician (Gastroenterology) Druscilla Brownie, MD as Consulting Physician (Dermatology)   Assessment:  Hypertension- Stable. Controlled on medication.  Encouraged heart healthy diet and exercise.  To follow up with PCP.  Hyperlipidemia- Takes Red Yeast Rice.  Last Lipid Panel-05/05/14: HDL was low and LDL was elevated.  Encouraged heart healthy diet and exercise.  Encouraged to continue Red Yeast Rice and to follow  up with PCP.    Exercise Activities and Dietary recommendations Current Exercise Habits:: Home exercise routine, Type of exercise: walking (Land), Time (Minutes): 30, Frequency (Times/Week): 4, Weekly Exercise (Minutes/Week): 120   Diet- Eats out mostly.  Hardly ever cooks.  Sometimes eats sandwiches.    Goals    . Increase physical activity     Increase walking.        Fall Risk Fall Risk  06/20/2015  Falls in the past year? No   Depression Screen PHQ 2/9 Scores 06/20/2015  PHQ - 2 Score 0    Cognitive Testing MMSE - Mini Mental State Exam 06/20/2015  Orientation to time 5  Orientation to Place 5  Registration 3  Attention/ Calculation 5  Recall 3  Language- name 2 objects 2  Language- repeat 1  Language- follow 3 step command 3  Language- read & follow direction 1  Write a sentence 1  Copy design 1  Total score 30    Immunization History  Administered Date(s) Administered  . Influenza Split 08/08/2011  . Influenza,inj,Quad PF,36+ Mos 06/20/2015  . Pneumococcal Conjugate-13 11/02/2013  . Pneumococcal Polysaccharide-23 06/20/2015  . Tdap 11/02/2013  . Zoster 10/13/2009   Screening Tests Health Maintenance  Topic Date Due  . COLONOSCOPY  05/10/2013  . Hepatitis C Screening  07/11/2015 (Originally [birth year])  . INFLUENZA VACCINE  05/08/2016  . TETANUS/TDAP  11/03/2023  . ZOSTAVAX  Completed  . PNA vac Low Risk Adult  Completed      Plan:  Schedule colonoscopy.  Increase physical activity.   Eat snacks and meals rich whole grains, fruit and vegetables, and lean meats.    Follow up with Elyn Aquas, PA-C as scheduled for CPE.      During the course of the visit the patient was educated and counseled about the following appropriate screening and preventive services:   Vaccines to include Pneumoccal, Influenza, Hepatitis B, Td, Zostavax, HCV  Electrocardiogram  Cardiovascular Disease  Colorectal cancer screening  Diabetes  screening  Prostate Cancer Screening  Glaucoma screening  Nutrition counseling   Smoking cessation counseling  Patient Instructions (the written plan) was given to the patient.    Rudene Anda, RN  06/20/2015

## 2015-07-18 DIAGNOSIS — I10 Essential (primary) hypertension: Secondary | ICD-10-CM | POA: Diagnosis not present

## 2015-07-18 DIAGNOSIS — K219 Gastro-esophageal reflux disease without esophagitis: Secondary | ICD-10-CM | POA: Diagnosis not present

## 2015-07-20 ENCOUNTER — Encounter: Payer: Self-pay | Admitting: Physician Assistant

## 2015-07-20 ENCOUNTER — Ambulatory Visit (INDEPENDENT_AMBULATORY_CARE_PROVIDER_SITE_OTHER): Payer: Medicare HMO | Admitting: Physician Assistant

## 2015-07-20 VITALS — BP 138/78 | HR 79 | Temp 97.8°F | Resp 16 | Ht 75.0 in | Wt 285.1 lb

## 2015-07-20 DIAGNOSIS — I10 Essential (primary) hypertension: Secondary | ICD-10-CM | POA: Diagnosis not present

## 2015-07-20 DIAGNOSIS — Z0000 Encounter for general adult medical examination without abnormal findings: Secondary | ICD-10-CM

## 2015-07-20 DIAGNOSIS — E785 Hyperlipidemia, unspecified: Secondary | ICD-10-CM

## 2015-07-20 MED ORDER — AMLODIPINE BESY-BENAZEPRIL HCL 10-20 MG PO CAPS: 1.0000 | ORAL_CAPSULE | Freq: Every day | ORAL | Status: DC

## 2015-07-20 MED ORDER — PANTOPRAZOLE SODIUM 40 MG PO TBEC: DELAYED_RELEASE_TABLET | ORAL | Status: DC

## 2015-07-20 NOTE — Assessment & Plan Note (Signed)
Stable. Asymptomatic. BP well-controlled. Continue current regimen. Will return for fasting labs to include BMP.

## 2015-07-20 NOTE — Progress Notes (Signed)
Patient presents to clinic today for annual exam.  Patient is fasting for labs.  Chronic Issues: Hypertension -- Endorses taking medications as directed. Patient denies chest pain, palpitations, lightheadedness, dizziness, vision changes or frequent headaches.  BP Readings from Last 3 Encounters:  07/20/15 138/78  06/20/15 132/80  01/18/15 156/91   GERD -- Is watching diet and taking Protonix daily. Denies breakthrough symptoms as long as he takes his medication.  Health Maintenance: Dental -- up-to-date Vision -- overdue Immunizations -- Up-to-date Colonoscopy -- Order placed. Awaiting scheduling.  Past Medical History  Diagnosis Date  . GERD (gastroesophageal reflux disease)   . Hyperlipidemia   . Hypertension   . LBP (low back pain)   . Depression     Past Surgical History  Procedure Laterality Date  . Shoulder arthroscopy w/ rotator cuff repair    . Knee arthroscopy      Right    Current Outpatient Prescriptions on File Prior to Visit  Medication Sig Dispense Refill  . naproxen sodium (ANAPROX) 220 MG tablet Take 220 mg by mouth 2 (two) times daily as needed.    . Red Yeast Rice Extract (RED YEAST RICE PO) Take 2 each by mouth daily.    . sildenafil (VIAGRA) 100 MG tablet Take 1 tablet (100 mg total) by mouth daily as needed for erectile dysfunction. Try taking 1/2 pill first and see if it gives desired effect. 5 tablet 11   No current facility-administered medications on file prior to visit.    Allergies  Allergen Reactions  . Enalapril Maleate     REACTION: cramps  . Hctz [Hydrochlorothiazide]     SOB  . Tribenzor [Olmesartan-Amlodipine-Hctz] Swelling    Fatigue, CP, edema  . Verapamil     REACTION: Fatigue    Family History  Problem Relation Age of Onset  . Cancer Daughter     breast  . Hypertension Other   . Pancreatic cancer Brother     Deceased  . Throat cancer Brother     Social History   Social History  . Marital Status: Divorced      Spouse Name: N/A  . Number of Children: N/A  . Years of Education: N/A   Occupational History  . Not on file.   Social History Main Topics  . Smoking status: Never Smoker   . Smokeless tobacco: Never Used  . Alcohol Use: 2.4 oz/week    4 Cans of beer per week  . Drug Use: No  . Sexual Activity: Not Currently   Other Topics Concern  . Not on file   Social History Narrative   Regular exercise- yes, seldom; working physically all the time.   Review of Systems  Constitutional: Negative for fever and weight loss.  HENT: Negative for ear discharge, ear pain, hearing loss and tinnitus.   Eyes: Negative for blurred vision, double vision, photophobia and pain.  Respiratory: Negative for cough and shortness of breath.   Cardiovascular: Negative for chest pain and palpitations.  Gastrointestinal: Negative for heartburn, nausea, vomiting, abdominal pain, diarrhea, constipation, blood in stool and melena.  Genitourinary: Negative for dysuria, urgency, frequency, hematuria and flank pain.  Musculoskeletal: Negative for falls.  Neurological: Negative for dizziness, loss of consciousness and headaches.  Endo/Heme/Allergies: Negative for environmental allergies.  Psychiatric/Behavioral: Negative for depression, suicidal ideas, hallucinations and substance abuse. The patient is not nervous/anxious and does not have insomnia.    BP 138/78 mmHg  Pulse 79  Temp(Src) 97.8 F (36.6 C) (Oral)  Resp 16  Ht 6\' 3"  (1.905 m)  Wt 285 lb 2 oz (129.332 kg)  BMI 35.64 kg/m2  SpO2 98%  Physical Exam  Constitutional: He is oriented to person, place, and time and well-developed, well-nourished, and in no distress.  HENT:  Head: Normocephalic and atraumatic.  Right Ear: External ear normal.  Left Ear: External ear normal.  Nose: Nose normal.  Mouth/Throat: Oropharynx is clear and moist. No oropharyngeal exudate.  Eyes: Conjunctivae and EOM are normal. Pupils are equal, round, and reactive to  light.  Neck: Neck supple. No thyromegaly present.  Cardiovascular: Normal rate, regular rhythm, normal heart sounds and intact distal pulses.   Pulmonary/Chest: Effort normal and breath sounds normal. No respiratory distress. He has no wheezes. He has no rales. He exhibits no tenderness.  Abdominal: Soft. Bowel sounds are normal. He exhibits no distension and no mass. There is no tenderness. There is no rebound and no guarding.  Genitourinary: Testes/scrotum normal.  DRE deferred.  Lymphadenopathy:    He has no cervical adenopathy.  Neurological: He is alert and oriented to person, place, and time.  Skin: Skin is warm and dry. No rash noted.  Psychiatric: Affect normal.  Vitals reviewed.   No results found for this or any previous visit (from the past 2160 hour(s)).  Assessment/Plan: Annual physical exam Depression screen negative. Health Maintenance reviewed -- up-to-date at recent [facility]. Patient to call to schedule his colonoscopy as order already placed. Preventive schedule discussed and handout given in AVS. Will obtain fasting labs today.   Hyperlipidemia Patient to return for fasting lipid and CMP. Continue red yeast rice.  Essential hypertension Stable. Asymptomatic. BP well-controlled. Continue current regimen. Will return for fasting labs to include BMP.

## 2015-07-20 NOTE — Patient Instructions (Signed)
Please go to the lab for blood work.  I will call you with your results. If your blood work is normal we will follow-up yearly for physicals. If anything is abnormal we will treat accordingly.  Please continue medications as directed.  Call to set up your colonoscopy -- [phone]  Preventive Care for Adults, Male A healthy lifestyle and preventive care can promote health and wellness. Preventive health guidelines for men include the following key practices:  A routine yearly physical is a good way to check with your health care provider about your health and preventative screening. It is a chance to share any concerns and updates on your health and to receive a thorough exam.  Visit your dentist for a routine exam and preventative care every 6 months. Brush your teeth twice a day and floss once a day. Good oral hygiene prevents tooth decay and gum disease.  The frequency of eye exams is based on your age, health, family medical history, use of contact lenses, and other factors. Follow your health care provider's recommendations for frequency of eye exams.  Eat a healthy diet. Foods such as vegetables, fruits, whole grains, low-fat dairy products, and lean protein foods contain the nutrients you need without too many calories. Decrease your intake of foods high in solid fats, added sugars, and salt. Eat the right amount of calories for you.Get information about a proper diet from your health care provider, if necessary.  Regular physical exercise is one of the most important things you can do for your health. Most adults should get at least 150 minutes of moderate-intensity exercise (any activity that increases your heart rate and causes you to sweat) each week. In addition, most adults need muscle-strengthening exercises on 2 or more days a week.  Maintain a healthy weight. The body mass index (BMI) is a screening tool to identify possible weight problems. It provides an estimate of body  fat based on height and weight. Your health care provider can find your BMI and can help you achieve or maintain a healthy weight.For adults 20 years and older:  A BMI below 18.5 is considered underweight.  A BMI of 18.5 to 24.9 is normal.  A BMI of 25 to 29.9 is considered overweight.  A BMI of 30 and above is considered obese.  Maintain normal blood lipids and cholesterol levels by exercising and minimizing your intake of saturated fat. Eat a balanced diet with plenty of fruit and vegetables. Blood tests for lipids and cholesterol should begin at age 22 and be repeated every 5 years. If your lipid or cholesterol levels are high, you are over 50, or you are at high risk for heart disease, you may need your cholesterol levels checked more frequently.Ongoing high lipid and cholesterol levels should be treated with medicines if diet and exercise are not working.  If you smoke, find out from your health care provider how to quit. If you do not use tobacco, do not start.  Lung cancer screening is recommended for adults aged 7-80 years who are at high risk for developing lung cancer because of a history of smoking. A yearly low-dose CT scan of the lungs is recommended for people who have at least a 30-pack-year history of smoking and are a current smoker or have quit within the past 15 years. A pack year of smoking is smoking an average of 1 pack of cigarettes a day for 1 year (for example: 1 pack a day for 30 years or 2 packs  a day for 15 years). Yearly screening should continue until the smoker has stopped smoking for at least 15 years. Yearly screening should be stopped for people who develop a health problem that would prevent them from having lung cancer treatment.  If you choose to drink alcohol, do not have more than 2 drinks per day. One drink is considered to be 12 ounces (355 mL) of beer, 5 ounces (148 mL) of wine, or 1.5 ounces (44 mL) of liquor.  Avoid use of street drugs. Do not share  needles with anyone. Ask for help if you need support or instructions about stopping the use of drugs.  High blood pressure causes heart disease and increases the risk of stroke. Your blood pressure should be checked at least every 1-2 years. Ongoing high blood pressure should be treated with medicines, if weight loss and exercise are not effective.  If you are 75-24 years old, ask your health care provider if you should take aspirin to prevent heart disease.  Diabetes screening is done by taking a blood sample to check your blood glucose level after you have not eaten for a certain period of time (fasting). If you are not overweight and you do not have risk factors for diabetes, you should be screened once every 3 years starting at age 8. If you are overweight or obese and you are 19-61 years of age, you should be screened for diabetes every year as part of your cardiovascular risk assessment.  Colorectal cancer can be detected and often prevented. Most routine colorectal cancer screening begins at the age of 58 and continues through age 70. However, your health care provider may recommend screening at an earlier age if you have risk factors for colon cancer. On a yearly basis, your health care provider may provide home test kits to check for hidden blood in the stool. Use of a small camera at the end of a tube to directly examine the colon (sigmoidoscopy or colonoscopy) can detect the earliest forms of colorectal cancer. Talk to your health care provider about this at age 41, when routine screening begins. Direct exam of the colon should be repeated every 5-10 years through age 75, unless early forms of precancerous polyps or small growths are found.  People who are at an increased risk for hepatitis B should be screened for this virus. You are considered at high risk for hepatitis B if:  You were born in a country where hepatitis B occurs often. Talk with your health care provider about which  countries are considered high risk.  Your parents were born in a high-risk country and you have not received a shot to protect against hepatitis B (hepatitis B vaccine).  You have HIV or AIDS.  You use needles to inject street drugs.  You live with, or have sex with, someone who has hepatitis B.  You are a man who has sex with other men (MSM).  You get hemodialysis treatment.  You take certain medicines for conditions such as cancer, organ transplantation, and autoimmune conditions.  Hepatitis C blood testing is recommended for all people born from [birth year] through 1965 and any individual with known risks for hepatitis C.  Practice safe sex. Use condoms and avoid high-risk sexual practices to reduce the spread of sexually transmitted infections (STIs). STIs include gonorrhea, chlamydia, syphilis, trichomonas, herpes, HPV, and human immunodeficiency virus (HIV). Herpes, HIV, and HPV are viral illnesses that have no cure. They can result in disability, cancer, and death.  If you are a man who has sex with other men, you should be screened at least once per year for:  HIV.  Urethral, rectal, and pharyngeal infection of gonorrhea, chlamydia, or both.  If you are at risk of being infected with HIV, it is recommended that you take a prescription medicine daily to prevent HIV infection. This is called preexposure prophylaxis (PrEP). You are considered at risk if:  You are a man who has sex with other men (MSM) and have other risk factors.  You are a heterosexual man, are sexually active, and are at increased risk for HIV infection.  You take drugs by injection.  You are sexually active with a partner who has HIV.  Talk with your health care provider about whether you are at high risk of being infected with HIV. If you choose to begin PrEP, you should first be tested for HIV. You should then be tested every 3 months for as long as you are taking PrEP.  A one-time screening for abdominal  aortic aneurysm (AAA) and surgical repair of large AAAs by ultrasound are recommended for men ages 80 to 79 years who are current or former smokers.  Healthy men should no longer receive prostate-specific antigen (PSA) blood tests as part of routine cancer screening. Talk with your health care provider about prostate cancer screening.  Testicular cancer screening is not recommended for adult males who have no symptoms. Screening includes self-exam, a health care provider exam, and other screening tests. Consult with your health care provider about any symptoms you have or any concerns you have about testicular cancer.  Use sunscreen. Apply sunscreen liberally and repeatedly throughout the day. You should seek shade when your shadow is shorter than you. Protect yourself by wearing long sleeves, pants, a wide-brimmed hat, and sunglasses year round, whenever you are outdoors.  Once a month, do a whole-body skin exam, using a mirror to look at the skin on your back. Tell your health care provider about new moles, moles that have irregular borders, moles that are larger than a pencil eraser, or moles that have changed in shape or color.  Stay current with required vaccines (immunizations).  Influenza vaccine. All adults should be immunized every year.  Tetanus, diphtheria, and acellular pertussis (Td, Tdap) vaccine. An adult who has not previously received Tdap or who does not know his vaccine status should receive 1 dose of Tdap. This initial dose should be followed by tetanus and diphtheria toxoids (Td) booster doses every 10 years. Adults with an unknown or incomplete history of completing a 3-dose immunization series with Td-containing vaccines should begin or complete a primary immunization series including a Tdap dose. Adults should receive a Td booster every 10 years.  Varicella vaccine. An adult without evidence of immunity to varicella should receive 2 doses or a second dose if he has previously  received 1 dose.  Human papillomavirus (HPV) vaccine. Males aged 11-21 years who have not received the vaccine previously should receive the 3-dose series. Males aged 22-26 years may be immunized. Immunization is recommended through the age of 50 years for any male who has sex with males and did not get any or all doses earlier. Immunization is recommended for any person with an immunocompromised condition through the age of 64 years if he did not get any or all doses earlier. During the 3-dose series, the second dose should be obtained 4-8 weeks after the first dose. The third dose should be obtained 24 weeks after  the first dose and 16 weeks after the second dose.  Zoster vaccine. One dose is recommended for adults aged 46 years or older unless certain conditions are present.  Measles, mumps, and rubella (MMR) vaccine. Adults born before 40 generally are considered immune to measles and mumps. Adults born in [birth year] or later should have 1 or more doses of MMR vaccine unless there is a contraindication to the vaccine or there is laboratory evidence of immunity to each of the three diseases. A routine second dose of MMR vaccine should be obtained at least 28 days after the first dose for students attending postsecondary schools, health care workers, or international travelers. People who received inactivated measles vaccine or an unknown type of measles vaccine during 1963-1967 should receive 2 doses of MMR vaccine. People who received inactivated mumps vaccine or an unknown type of mumps vaccine before 1979 and are at high risk for mumps infection should consider immunization with 2 doses of MMR vaccine. Unvaccinated health care workers born before [birth year] who lack laboratory evidence of measles, mumps, or rubella immunity or laboratory confirmation of disease should consider measles and mumps immunization with 2 doses of MMR vaccine or rubella immunization with 1 dose of MMR vaccine.  Pneumococcal 13-valent  conjugate (PCV13) vaccine. When indicated, a person who is uncertain of his immunization history and has no record of immunization should receive the PCV13 vaccine. All adults 15 years of age and older should receive this vaccine. An adult aged 17 years or older who has certain medical conditions and has not been previously immunized should receive 1 dose of PCV13 vaccine. This PCV13 should be followed with a dose of pneumococcal polysaccharide (PPSV23) vaccine. Adults who are at high risk for pneumococcal disease should obtain the PPSV23 vaccine at least 8 weeks after the dose of PCV13 vaccine. Adults older than 67 years of age who have normal immune system function should obtain the PPSV23 vaccine dose at least 1 year after the dose of PCV13 vaccine.  Pneumococcal polysaccharide (PPSV23) vaccine. When PCV13 is also indicated, PCV13 should be obtained first. All adults aged 4 years and older should be immunized. An adult younger than age 43 years who has certain medical conditions should be immunized. Any person who resides in a nursing home or long-term care facility should be immunized. An adult smoker should be immunized. People with an immunocompromised condition and certain other conditions should receive both PCV13 and PPSV23 vaccines. People with human immunodeficiency virus (HIV) infection should be immunized as soon as possible after diagnosis. Immunization during chemotherapy or radiation therapy should be avoided. Routine use of PPSV23 vaccine is not recommended for American Indians, [place] Natives, or people younger than 65 years unless there are medical conditions that require PPSV23 vaccine. When indicated, people who have unknown immunization and have no record of immunization should receive PPSV23 vaccine. One-time revaccination 5 years after the first dose of PPSV23 is recommended for people aged 19-64 years who have chronic kidney failure, nephrotic syndrome, asplenia, or immunocompromised  conditions. People who received 1-2 doses of PPSV23 before age 61 years should receive another dose of PPSV23 vaccine at age 46 years or later if at least 5 years have passed since the previous dose. Doses of PPSV23 are not needed for people immunized with PPSV23 at or after age 7 years.  Meningococcal vaccine. Adults with asplenia or persistent complement component deficiencies should receive 2 doses of quadrivalent meningococcal conjugate (MenACWY-D) vaccine. The doses should be obtained at least 2 months  apart. Microbiologists working with certain meningococcal bacteria, [employer] recruits, people at risk during an outbreak, and people who travel to or live in countries with a high rate of meningitis should be immunized. A first-year college student up through age 84 years who is living in a residence hall should receive a dose if he did not receive a dose on or after his 16th birthday. Adults who have certain high-risk conditions should receive one or more doses of vaccine.  Hepatitis A vaccine. Adults who wish to be protected from this disease, have chronic liver disease, work with hepatitis A-infected animals, work in hepatitis A research labs, or travel to or work in countries with a high rate of hepatitis A should be immunized. Adults who were previously unvaccinated and who anticipate close contact with an international adoptee during the first 60 days after arrival in the [place] from a country with a high rate of hepatitis A should be immunized.  Hepatitis B vaccine. Adults should be immunized if they wish to be protected from this disease, are under age 69 years and have diabetes, have chronic liver disease, have had more than one sex partner in the past 6 months, may be exposed to blood or other infectious body fluids, are household contacts or sex partners of hepatitis B positive people, are clients or workers in certain care facilities, or travel to or work in countries with a high rate of  hepatitis B.  Haemophilus influenzae type b (Hib) vaccine. A previously unvaccinated person with asplenia or sickle cell disease or having a scheduled splenectomy should receive 1 dose of Hib vaccine. Regardless of previous immunization, a recipient of a hematopoietic stem cell transplant should receive a 3-dose series 6-12 months after his successful transplant. Hib vaccine is not recommended for adults with HIV infection. Preventive Service / Frequency Ages 68 to 45  Blood pressure check.** / Every 3-5 years.  Lipid and cholesterol check.** / Every 5 years beginning at age 56.  Hepatitis C blood test.** / For any individual with known risks for hepatitis C.  Skin self-exam. / Monthly.  Influenza vaccine. / Every year.  Tetanus, diphtheria, and acellular pertussis (Tdap, Td) vaccine.** / Consult your health care provider. 1 dose of Td every 10 years.  Varicella vaccine.** / Consult your health care provider.  HPV vaccine. / 3 doses over 6 months, if 61 or younger.  Measles, mumps, rubella (MMR) vaccine.** / You need at least 1 dose of MMR if you were born in 1957 or later. You may also need a second dose.  Pneumococcal 13-valent conjugate (PCV13) vaccine.** / Consult your health care provider.  Pneumococcal polysaccharide (PPSV23) vaccine.** / 1 to 2 doses if you smoke cigarettes or if you have certain conditions.  Meningococcal vaccine.** / 1 dose if you are age 48 to 45 years and a Market researcher living in a residence hall, or have one of several medical conditions. You may also need additional booster doses.  Hepatitis A vaccine.** / Consult your health care provider.  Hepatitis B vaccine.** / Consult your health care provider.  Haemophilus influenzae type b (Hib) vaccine.** / Consult your health care provider. Ages 39 to 14  Blood pressure check.** / Every year.  Lipid and cholesterol check.** / Every 5 years beginning at age 24.  Lung cancer screening. /  Every year if you are aged 30-80 years and have a 30-pack-year history of smoking and currently smoke or have quit within the past 15 years. Yearly screening  is stopped once you have quit smoking for at least 15 years or develop a health problem that would prevent you from having lung cancer treatment.  Fecal occult blood test (FOBT) of stool. / Every year beginning at age 76 and continuing until age 39. You may not have to do this test if you get a colonoscopy every 10 years.  Flexible sigmoidoscopy** or colonoscopy.** / Every 5 years for a flexible sigmoidoscopy or every 10 years for a colonoscopy beginning at age 57 and continuing until age 4.  Hepatitis C blood test.** / For all people born from [birth year] through 1965 and any individual with known risks for hepatitis C.  Skin self-exam. / Monthly.  Influenza vaccine. / Every year.  Tetanus, diphtheria, and acellular pertussis (Tdap/Td) vaccine.** / Consult your health care provider. 1 dose of Td every 10 years.  Varicella vaccine.** / Consult your health care provider.  Zoster vaccine.** / 1 dose for adults aged 77 years or older.  Measles, mumps, rubella (MMR) vaccine.** / You need at least 1 dose of MMR if you were born in 1955 or later. You may also need a second dose.  Pneumococcal 13-valent conjugate (PCV13) vaccine.** / Consult your health care provider.  Pneumococcal polysaccharide (PPSV23) vaccine.** / 1 to 2 doses if you smoke cigarettes or if you have certain conditions.  Meningococcal vaccine.** / Consult your health care provider.  Hepatitis A vaccine.** / Consult your health care provider.  Hepatitis B vaccine.** / Consult your health care provider.  Haemophilus influenzae type b (Hib) vaccine.** / Consult your health care provider. Ages 2 and over  Blood pressure check.** / Every year.  Lipid and cholesterol check.**/ Every 5 years beginning at age 23.  Lung cancer screening. / Every year if you are aged 47-80  years and have a 30-pack-year history of smoking and currently smoke or have quit within the past 15 years. Yearly screening is stopped once you have quit smoking for at least 15 years or develop a health problem that would prevent you from having lung cancer treatment.  Fecal occult blood test (FOBT) of stool. / Every year beginning at age 28 and continuing until age 49. You may not have to do this test if you get a colonoscopy every 10 years.  Flexible sigmoidoscopy** or colonoscopy.** / Every 5 years for a flexible sigmoidoscopy or every 10 years for a colonoscopy beginning at age 9 and continuing until age 81.  Hepatitis C blood test.** / For all people born from [birth year] through 1963 and any individual with known risks for hepatitis C.  Abdominal aortic aneurysm (AAA) screening.** / A one-time screening for ages 25 to 40 years who are current or former smokers.  Skin self-exam. / Monthly.  Influenza vaccine. / Every year.  Tetanus, diphtheria, and acellular pertussis (Tdap/Td) vaccine.** / 1 dose of Td every 10 years.  Varicella vaccine.** / Consult your health care provider.  Zoster vaccine.** / 1 dose for adults aged 54 years or older.  Pneumococcal 13-valent conjugate (PCV13) vaccine.** / 1 dose for all adults aged 3 years and older.  Pneumococcal polysaccharide (PPSV23) vaccine.** / 1 dose for all adults aged 81 years and older.  Meningococcal vaccine.** / Consult your health care provider.  Hepatitis A vaccine.** / Consult your health care provider.  Hepatitis B vaccine.** / Consult your health care provider.  Haemophilus influenzae type b (Hib) vaccine.** / Consult your health care provider. **Family history and personal history of risk and conditions may change  your health care provider's recommendations.   This information is not intended to replace advice given to you by your health care provider. Make sure you discuss any questions you have with your health care  provider.   Document Released: 11/20/2001 Document Revised: 10/15/2014 Document Reviewed: 02/19/2011 Elsevier Interactive Patient Education [employer].

## 2015-07-20 NOTE — Progress Notes (Signed)
Pre visit review using our clinic review tool, if applicable. No additional management support is needed unless otherwise documented below in the visit note/SLS  

## 2015-07-20 NOTE — Assessment & Plan Note (Signed)
Patient to return for fasting lipid and CMP. Continue red yeast rice.

## 2015-07-20 NOTE — Assessment & Plan Note (Signed)
Depression screen negative. Health Maintenance reviewed -- up-to-date at recent [facility]. Patient to call to schedule his colonoscopy as order already placed. Preventive schedule discussed and handout given in AVS. Will obtain fasting labs today.

## 2015-07-26 ENCOUNTER — Other Ambulatory Visit: Payer: PRIVATE HEALTH INSURANCE

## 2015-07-27 ENCOUNTER — Encounter: Payer: Self-pay | Admitting: Internal Medicine

## 2015-08-02 ENCOUNTER — Other Ambulatory Visit (INDEPENDENT_AMBULATORY_CARE_PROVIDER_SITE_OTHER): Payer: Medicare HMO

## 2015-08-02 DIAGNOSIS — Z125 Encounter for screening for malignant neoplasm of prostate: Secondary | ICD-10-CM | POA: Diagnosis not present

## 2015-08-02 DIAGNOSIS — E785 Hyperlipidemia, unspecified: Secondary | ICD-10-CM | POA: Diagnosis not present

## 2015-08-02 DIAGNOSIS — Z0000 Encounter for general adult medical examination without abnormal findings: Secondary | ICD-10-CM

## 2015-08-02 DIAGNOSIS — I10 Essential (primary) hypertension: Secondary | ICD-10-CM

## 2015-08-02 LAB — CBC
HEMATOCRIT: 47.7 % (ref 39.0–52.0)
HEMOGLOBIN: 16.3 g/dL (ref 13.0–17.0)
MCHC: 34.1 g/dL (ref 30.0–36.0)
MCV: 95.6 fl (ref 78.0–100.0)
Platelets: 217 10*3/uL (ref 150.0–400.0)
RBC: 4.99 Mil/uL (ref 4.22–5.81)
RDW: 13.8 % (ref 11.5–15.5)
WBC: 7 10*3/uL (ref 4.0–10.5)

## 2015-08-02 LAB — COMPREHENSIVE METABOLIC PANEL
ALBUMIN: 4.3 g/dL (ref 3.5–5.2)
ALK PHOS: 64 U/L (ref 39–117)
ALT: 46 U/L (ref 0–53)
AST: 26 U/L (ref 0–37)
BUN: 12 mg/dL (ref 6–23)
CHLORIDE: 102 meq/L (ref 96–112)
CO2: 30 mEq/L (ref 19–32)
Calcium: 9.9 mg/dL (ref 8.4–10.5)
Creatinine, Ser: 0.87 mg/dL (ref 0.40–1.50)
GFR: 93.03 mL/min (ref >60.00)
Glucose, Bld: 123 mg/dL — ABNORMAL HIGH (ref 70–99)
POTASSIUM: 4.4 meq/L (ref 3.5–5.1)
SODIUM: 140 meq/L (ref 135–145)
TOTAL PROTEIN: 7.3 g/dL (ref 6.0–8.3)
Total Bilirubin: 0.7 mg/dL (ref 0.2–1.2)

## 2015-08-02 LAB — URINALYSIS, ROUTINE W REFLEX MICROSCOPIC
Bilirubin Urine: NEGATIVE
HGB URINE DIPSTICK: NEGATIVE
Ketones, ur: NEGATIVE
LEUKOCYTES UA: NEGATIVE
NITRITE: NEGATIVE
RBC / HPF: NONE SEEN (ref 0–?)
SPECIFIC GRAVITY, URINE: 1.02 (ref 1.000–1.030)
Total Protein, Urine: NEGATIVE
URINE GLUCOSE: NEGATIVE
Urobilinogen, UA: 0.2 (ref 0.0–1.0)
WBC UA: NONE SEEN (ref 0–?)
pH: 6 (ref 5.0–8.0)

## 2015-08-02 LAB — LIPID PANEL
CHOLESTEROL: 176 mg/dL (ref 0–200)
HDL: 40.2 mg/dL (ref >39.00)
LDL CALC: 119 mg/dL — AB (ref 0–99)
NonHDL: 135.62
Total CHOL/HDL Ratio: 4
Triglycerides: 85 mg/dL (ref 0.0–149.0)
VLDL: 17 mg/dL (ref 0.0–40.0)

## 2015-08-02 LAB — PSA, MEDICARE: PSA: 1.4 ng/mL (ref 0.10–4.00)

## 2015-08-15 ENCOUNTER — Ambulatory Visit (AMBULATORY_SURGERY_CENTER): Payer: PRIVATE HEALTH INSURANCE | Admitting: *Deleted

## 2015-08-15 VITALS — Ht 76.0 in | Wt 287.0 lb

## 2015-08-15 DIAGNOSIS — Z1211 Encounter for screening for malignant neoplasm of colon: Secondary | ICD-10-CM

## 2015-08-15 NOTE — Progress Notes (Signed)
Patient denies any allergies to eggs or soy. Patient denies any problems with anesthesia/sedation. Patient denies any oxygen use at home and does not take any diet/weight loss medications.  

## 2015-08-29 ENCOUNTER — Encounter: Payer: Self-pay | Admitting: Internal Medicine

## 2015-08-29 ENCOUNTER — Ambulatory Visit (AMBULATORY_SURGERY_CENTER): Payer: Medicare HMO | Admitting: Internal Medicine

## 2015-08-29 VITALS — BP 144/86 | HR 72 | Temp 97.6°F | Resp 10 | Ht 76.0 in | Wt 287.0 lb

## 2015-08-29 DIAGNOSIS — D123 Benign neoplasm of transverse colon: Secondary | ICD-10-CM

## 2015-08-29 DIAGNOSIS — Z1211 Encounter for screening for malignant neoplasm of colon: Secondary | ICD-10-CM

## 2015-08-29 DIAGNOSIS — I10 Essential (primary) hypertension: Secondary | ICD-10-CM | POA: Diagnosis not present

## 2015-08-29 DIAGNOSIS — E669 Obesity, unspecified: Secondary | ICD-10-CM | POA: Diagnosis not present

## 2015-08-29 MED ORDER — SODIUM CHLORIDE 0.9 % IV SOLN: 500.0000 mL | INTRAVENOUS | Status: DC

## 2015-08-29 NOTE — Patient Instructions (Addendum)
I found and removed 3 small polyps that look benign. You also have a condition called diverticulosis - common and not usually a problem. Please read the handout provided.  I will let you know pathology results and when/if to have another routine colonoscopy by mail.  I appreciate the opportunity to care for you. Gatha Mayer, MD, FACG   YOU HAD AN ENDOSCOPIC PROCEDURE TODAY AT [facility]:   Refer to the procedure report that was given to you for any specific questions about what was found during the examination.  If the procedure report does not answer your questions, please call your gastroenterologist to clarify.  If you requested that your care partner not be given the details of your procedure findings, then the procedure report has been included in a sealed envelope for you to review at your convenience later.  YOU SHOULD EXPECT: Some feelings of bloating in the abdomen. Passage of more gas than usual.  Walking can help get rid of the air that was put into your GI tract during the procedure and reduce the bloating. If you had a lower endoscopy (such as a colonoscopy or flexible sigmoidoscopy) you may notice spotting of blood in your stool or on the toilet paper. If you underwent a bowel prep for your procedure, you may not have a normal bowel movement for a few days.  Please Note:  You might notice some irritation and congestion in your nose or some drainage.  This is from the oxygen used during your procedure.  There is no need for concern and it should clear up in a day or so.  SYMPTOMS TO REPORT IMMEDIATELY:   Following lower endoscopy (colonoscopy or flexible sigmoidoscopy):  Excessive amounts of blood in the stool  Significant tenderness or worsening of abdominal pains  Swelling of the abdomen that is new, acute  Fever of 100F or higher    For urgent or emergent issues, a gastroenterologist can be reached at any hour by calling [phone]  [phone].   DIET: Your first meal following the procedure should be a small meal and then it is ok to progress to your normal diet. Heavy or fried foods are harder to digest and may make you feel nauseous or bloated.  Likewise, meals heavy in dairy and vegetables can increase bloating.  Drink plenty of fluids but you should avoid alcoholic beverages for 24 hours.  ACTIVITY:  You should plan to take it easy for the rest of today and you should NOT DRIVE or use heavy machinery until tomorrow (because of the sedation medicines used during the test).    FOLLOW UP: Our staff will call the number listed on your records the next business day following your procedure to check on you and address any questions or concerns that you may have regarding the information given to you following your procedure. If we do not reach you, we will leave a message.  However, if you are feeling well and you are not experiencing any problems, there is no need to return our call.  We will assume that you have returned to your regular daily activities without incident.  If any biopsies were taken you will be contacted by phone or by letter within the next 1-3 weeks.  Please call us at [phone] if you have not heard about the biopsies in 3 weeks.    SIGNATURES/CONFIDENTIALITY: You and/or your care partner have signed paperwork which will be entered into your electronic medical record.  These signatures attest to the fact that that the information above on your After Visit Summary has been reviewed and is understood.  Full responsibility of the confidentiality of this discharge information lies with you and/or your care-partner.   Information on polyps and diverticulosis given to you today

## 2015-08-29 NOTE — Op Note (Signed)
[place]  [address] [place], [id number]   COLONOSCOPY PROCEDURE REPORT  PATIENT: Hector Edwards, Hector Edwards  MR#: [id number] BIRTHDATE: 01-08-1946 , 69  yrs. old GENDER: male ENDOSCOPIST: Gatha Mayer, MD, [facility] PROCEDURE DATE:  08/29/2015 PROCEDURE:   Colonoscopy with biopsy and Colonoscopy with snare polypectomy First Screening Colonoscopy - Avg.  risk and is 50 yrs.  old or older - No.  Prior Negative Screening - Now for repeat screening. 10 or more years since last screening  History of Adenoma - Now for follow-up colonoscopy & has been > or = to 3 yrs.  N/A  Polyps removed today? Yes ASA CLASS:   Class II INDICATIONS:Screening for colonic neoplasia and Colorectal Neoplasm Risk Assessment for this procedure is average risk. MEDICATIONS: Propofol 200 mg IV and Monitored anesthesia care  DESCRIPTION OF PROCEDURE:   After the risks benefits and alternatives of the procedure were thoroughly explained, informed consent was obtained.  The digital rectal exam revealed no abnormalities of the rectum, revealed no prostatic nodules, and revealed the prostate was not enlarged.   The [id number] endoscope was introduced through the anus and advanced to the cecum, which was identified by both the appendix and ileocecal valve. No adverse events experienced.   The quality of the prep was good.  (MiraLax was used)  The instrument was then slowly withdrawn as the colon was fully examined. Estimated blood loss is zero unless otherwise noted in this procedure report.  COLON FINDINGS: Three polypoid shaped sessile polyps ranging from 2 to 26mm in size were found in the transverse colon.  Polypectomies were performed with cold forceps and with a cold snare.  The resection was complete, the polyp tissue was completely retrieved and sent to histology.   There was severe diverticulosis noted in the sigmoid colon.   The examination was otherwise normal. Retroflexed views  revealed no abnormalities. The time to cecum = 2.1 Withdrawal time = 12.0   The scope was withdrawn and the procedure completed. COMPLICATIONS: There were no immediate complications.  ENDOSCOPIC IMPRESSION: 1.   Three sessile polyps ranging from 2 to 77mm in size were found in the transverse colon; polypectomies were performed with cold forceps and with a cold snare 2.   Severe diverticulosis was noted in the sigmoid colon 3.   The examination was otherwise normal - good prep - second screening  RECOMMENDATIONS: Timing of repeat colonoscopy will be determined by pathology findings.  eSigned:  Gatha Mayer, MD, [facility] 08/29/2015 9:10 AM   cc: The Patient Hector Rio PA-C

## 2015-08-29 NOTE — Progress Notes (Signed)
Called to room to assist during endoscopic procedure.  Patient ID and intended procedure confirmed with present staff. Received instructions for my participation in the procedure from the performing physician.  

## 2015-08-29 NOTE — Progress Notes (Signed)
To recovery, report to [place], RN, VSS 

## 2015-08-30 ENCOUNTER — Telehealth: Payer: Self-pay | Admitting: *Deleted

## 2015-08-30 NOTE — Telephone Encounter (Signed)
  Follow up Call-  Call back number 08/29/2015  Post procedure Call Back phone  # [phone]  Permission to leave phone message Yes     Patient questions:  Do you have a fever, pain , or abdominal swelling? No. Pain Score  0 *  Have you tolerated food without any problems? Yes.    Have you been able to return to your normal activities? Yes.    Do you have any questions about your discharge instructions: Diet   No. Medications  No. Follow up visit  No.  Do you have questions or concerns about your Care? No.  Actions: * If pain score is 4 or above: No action needed, pain <4.

## 2015-09-04 ENCOUNTER — Encounter: Payer: Self-pay | Admitting: Internal Medicine

## 2015-09-04 DIAGNOSIS — Z86010 Personal history of colonic polyps: Secondary | ICD-10-CM

## 2015-09-04 DIAGNOSIS — Z860101 Personal history of adenomatous and serrated colon polyps: Secondary | ICD-10-CM

## 2015-09-04 HISTORY — DX: Personal history of colonic polyps: Z86.010

## 2015-09-04 HISTORY — DX: Personal history of adenomatous and serrated colon polyps: Z86.0101

## 2015-09-04 NOTE — Progress Notes (Signed)
Quick Note:  3 adenomas max 5 mm  Repeat colonoscopy approx 2019-20 ______

## 2016-02-22 DIAGNOSIS — L820 Inflamed seborrheic keratosis: Secondary | ICD-10-CM | POA: Diagnosis not present

## 2016-02-22 DIAGNOSIS — D1801 Hemangioma of skin and subcutaneous tissue: Secondary | ICD-10-CM | POA: Diagnosis not present

## 2016-02-22 DIAGNOSIS — L814 Other melanin hyperpigmentation: Secondary | ICD-10-CM | POA: Diagnosis not present

## 2016-02-24 ENCOUNTER — Other Ambulatory Visit: Payer: Self-pay | Admitting: Physician Assistant

## 2016-02-24 DIAGNOSIS — Z0000 Encounter for general adult medical examination without abnormal findings: Secondary | ICD-10-CM | POA: Diagnosis not present

## 2016-02-24 DIAGNOSIS — K219 Gastro-esophageal reflux disease without esophagitis: Secondary | ICD-10-CM | POA: Diagnosis not present

## 2016-02-24 DIAGNOSIS — I10 Essential (primary) hypertension: Secondary | ICD-10-CM | POA: Diagnosis not present

## 2016-02-24 NOTE — Telephone Encounter (Signed)
Rx's sent to the pharmacy by e-script.//AB/CMA 

## 2016-03-19 DIAGNOSIS — W57XXXA Bitten or stung by nonvenomous insect and other nonvenomous arthropods, initial encounter: Secondary | ICD-10-CM | POA: Diagnosis not present

## 2016-03-19 DIAGNOSIS — S30860A Insect bite (nonvenomous) of lower back and pelvis, initial encounter: Secondary | ICD-10-CM | POA: Diagnosis not present

## 2016-04-16 ENCOUNTER — Ambulatory Visit (INDEPENDENT_AMBULATORY_CARE_PROVIDER_SITE_OTHER): Payer: Medicare HMO | Admitting: Physician Assistant

## 2016-04-16 ENCOUNTER — Encounter: Payer: Self-pay | Admitting: Physician Assistant

## 2016-04-16 VITALS — BP 128/74 | HR 85 | Temp 97.8°F | Resp 16 | Ht 76.0 in | Wt 284.2 lb

## 2016-04-16 DIAGNOSIS — E785 Hyperlipidemia, unspecified: Secondary | ICD-10-CM

## 2016-04-16 DIAGNOSIS — R0602 Shortness of breath: Secondary | ICD-10-CM

## 2016-04-16 LAB — CBC
HCT: 47 % (ref 39.0–52.0)
HEMOGLOBIN: 16.1 g/dL (ref 13.0–17.0)
MCHC: 34.3 g/dL (ref 30.0–36.0)
MCV: 94.9 fl (ref 78.0–100.0)
PLATELETS: 194 10*3/uL (ref 150.0–400.0)
RBC: 4.95 Mil/uL (ref 4.22–5.81)
RDW: 13.7 % (ref 11.5–15.5)
WBC: 7.1 10*3/uL (ref 4.0–10.5)

## 2016-04-16 LAB — LIPID PANEL
CHOLESTEROL: 165 mg/dL (ref 0–200)
HDL: 35.1 mg/dL — ABNORMAL LOW (ref >39.00)
LDL CALC: 114 mg/dL — AB (ref 0–99)
NonHDL: 129.87
TRIGLYCERIDES: 80 mg/dL (ref 0.0–149.0)
Total CHOL/HDL Ratio: 5
VLDL: 16 mg/dL (ref 0.0–40.0)

## 2016-04-16 LAB — COMPREHENSIVE METABOLIC PANEL
ALBUMIN: 4.4 g/dL (ref 3.5–5.2)
ALT: 45 U/L (ref 0–53)
AST: 27 U/L (ref 0–37)
Alkaline Phosphatase: 61 U/L (ref 39–117)
BILIRUBIN TOTAL: 0.8 mg/dL (ref 0.2–1.2)
BUN: 18 mg/dL (ref 6–23)
CALCIUM: 9.8 mg/dL (ref 8.4–10.5)
CO2: 27 meq/L (ref 19–32)
Chloride: 105 mEq/L (ref 96–112)
Creatinine, Ser: 0.85 mg/dL (ref 0.40–1.50)
GFR: 95.35 mL/min (ref >60.00)
Glucose, Bld: 109 mg/dL — ABNORMAL HIGH (ref 70–99)
Potassium: 4 mEq/L (ref 3.5–5.1)
Sodium: 138 mEq/L (ref 135–145)
Total Protein: 7.6 g/dL (ref 6.0–8.3)

## 2016-04-16 LAB — TSH: TSH: 2.06 u[IU]/mL (ref 0.35–4.50)

## 2016-04-16 NOTE — Patient Instructions (Signed)
Please go to the lab for blood work. We will call with results.  You will be contacted for echocardiogram and stress testing. If you notice any acute worsening of symptoms, please go to the ER for further assessment.

## 2016-04-18 ENCOUNTER — Telehealth: Payer: Self-pay | Admitting: *Deleted

## 2016-04-18 MED ORDER — ATORVASTATIN CALCIUM 10 MG PO TABS: 10.0000 mg | ORAL_TABLET | Freq: Every day | ORAL | Status: DC

## 2016-04-18 NOTE — Telephone Encounter (Signed)
-----   Message from Brunetta Jeans, PA-C sent at 04/17/2016  9:24 PM EDT ----- Labs look good overall. His LDL cholesterol is just borderline high, however giving family history of heart disease and current workup in process, I would like for him to start 81 mg ASA daily and would like to send in low dose (10 mg) of Lipitor to start daily. This medication will help cholesterol and reduce inflammation in the blood vessels. Ok to send in 1 month supply with 1 refill. FU 6 weeks.

## 2016-04-18 NOTE — Telephone Encounter (Signed)
Patient informed, understood & agreed; Rx to pharmacy/SLS 07/12

## 2016-04-23 NOTE — Progress Notes (Signed)
Patient presents to clinic today c/o fatigue and SOB with exertion over the past several months which is gradually worsening. Denies chest pain, palpitations, lightheadedness or dizziness. Does note SOB with walking up steps. Denies history of anemia or thyroid abnormality. Does have history of hypertension and hyperlipidemia, currently on Lotrel and Lipitor. Denies personal or family history of known CAD.  BP Readings from Last 3 Encounters:  04/16/16 128/74  08/29/15 144/86  07/20/15 138/78   Past Medical History  Diagnosis Date  . GERD (gastroesophageal reflux disease)   . Hyperlipidemia   . Hypertension   . LBP (low back pain)   . Depression   . Hx of adenomatous colonic polyps 09/04/2015    Current Outpatient Prescriptions on File Prior to Visit  Medication Sig Dispense Refill  . amLODipine-benazepril (LOTREL) 10-20 MG capsule TAKE 1 CAPSULE BY MOUTH DAILY. 90 capsule 1  . naproxen sodium (ANAPROX) 220 MG tablet Take 440 mg by mouth at bedtime.     . pantoprazole (PROTONIX) 40 MG tablet TAKE 1 TABLET BY MOUTH EVERY DAY 90 tablet 1  . Red Yeast Rice Extract (RED YEAST RICE PO) Take 2 each by mouth daily.    . sildenafil (VIAGRA) 100 MG tablet Take 1 tablet (100 mg total) by mouth daily as needed for erectile dysfunction. Try taking 1/2 pill first and see if it gives desired effect. 5 tablet 11   No current facility-administered medications on file prior to visit.    Allergies  Allergen Reactions  . Enalapril Maleate     REACTION: cramps  . Hctz [Hydrochlorothiazide]     SOB  . Tribenzor [Olmesartan-Amlodipine-Hctz] Swelling    Fatigue, CP, edema  . Verapamil     REACTION: Fatigue    Family History  Problem Relation Age of Onset  . Cancer Daughter     breast  . Hypertension Other   . Pancreatic cancer Brother     Deceased  . Throat cancer Brother   . Colon cancer Neg Hx   . Esophageal cancer Neg Hx   . Stomach cancer Neg Hx   . Rectal cancer Neg Hx      Social History   Social History  . Marital Status: Divorced    Spouse Name: N/A  . Number of Children: N/A  . Years of Education: N/A   Social History Main Topics  . Smoking status: Never Smoker   . Smokeless tobacco: Never Used  . Alcohol Use: 2.4 oz/week    4 Cans of beer per week  . Drug Use: No  . Sexual Activity: Not Currently   Other Topics Concern  . None   Social History Narrative   Regular exercise- yes, seldom; working physically all the time.   Review of Systems - See HPI.  All other ROS are negative.  BP 128/74 mmHg  Pulse 85  Temp(Src) 97.8 F (36.6 C) (Oral)  Resp 16  Ht _0  (1.93 m)  Wt 284 lb 4 oz (128.935 kg)  BMI 34.61 kg/m2  SpO2 98%  Physical Exam  Constitutional: He is oriented to person, place, and time and well-developed, well-nourished, and in no distress.  HENT:  Head: Normocephalic and atraumatic.  Eyes: Conjunctivae are normal.  Neck: Neck supple. No thyromegaly present.  Cardiovascular: Normal rate, regular rhythm, normal heart sounds and intact distal pulses.   Pulmonary/Chest: Effort normal and breath sounds normal. No respiratory distress. He has no wheezes. He has no rales. He exhibits no tenderness.  Neurological:  He is alert and oriented to person, place, and time.  Skin: Skin is warm and dry. No rash noted.  Psychiatric: Affect normal.  Vitals reviewed.   Recent Results (from the past 2160 hour(s))  CBC     Status: None   Collection Time: 04/16/16 11:33 AM  Result Value Ref Range   WBC 7.1 4.0 - 10.5 K/uL   RBC 4.95 4.22 - 5.81 Mil/uL   Platelets 194.0 150.0 - 400.0 K/uL   Hemoglobin 16.1 13.0 - 17.0 g/dL   HCT 47.0 39.0 - 52.0 %   MCV 94.9 78.0 - 100.0 fl   MCHC 34.3 30.0 - 36.0 g/dL   RDW 13.7 11.5 - 15.5 %  Comp Met (CMET)     Status: Abnormal   Collection Time: 04/16/16 11:33 AM  Result Value Ref Range   Sodium 138 135 - 145 mEq/L   Potassium 4.0 3.5 - 5.1 mEq/L   Chloride 105 96 - 112 mEq/L   CO2 27 19 -  32 mEq/L   Glucose, Bld 109 (H) 70 - 99 mg/dL   BUN 18 6 - 23 mg/dL   Creatinine, Ser 0.85 0.40 - 1.50 mg/dL   Total Bilirubin 0.8 0.2 - 1.2 mg/dL   Alkaline Phosphatase 61 39 - 117 U/L   AST 27 0 - 37 U/L   ALT 45 0 - 53 U/L   Total Protein 7.6 6.0 - 8.3 g/dL   Albumin 4.4 3.5 - 5.2 g/dL   Calcium 9.8 8.4 - 10.5 mg/dL   GFR 95.35 >60.00 mL/min  TSH     Status: None   Collection Time: 04/16/16 11:33 AM  Result Value Ref Range   TSH 2.06 0.35 - 4.50 uIU/mL  Lipid panel     Status: Abnormal   Collection Time: 04/16/16 11:33 AM  Result Value Ref Range   Cholesterol 165 0 - 200 mg/dL    Comment: ATP [name] Classification       Desirable:  < 200 mg/dL               Borderline High:  200 - 239 mg/dL          High:  > = 240 mg/dL   Triglycerides 80.0 0.0 - 149.0 mg/dL    Comment: Normal:  <150 mg/dLBorderline High:  150 - 199 mg/dL   HDL 35.10 (L) >39.00 mg/dL   VLDL 16.0 0.0 - 40.0 mg/dL   LDL Cholesterol 114 (H) 0 - 99 mg/dL   Total CHOL/HDL Ratio 5     Comment:                Men          Women1/2 Average Risk     3.4          3.3Average Risk          5.0          4.42X Average Risk          9.6          7.13X Average Risk          15.0          11.0                       NonHDL 129.87     Comment: NOTE:  Non-HDL goal should be 30 mg/dL higher than patient's LDL goal (i.e. LDL goal of < 70 mg/dL, would have non-HDL goal of < 100  mg/dL)    Assessment/Plan: 1. SOB (shortness of breath) on exertion EKG obtained revealing NSR. Will obtain CBC, CMP, TSH today. Will repeat lipid panel to assess control of cholesterol with his lipitor. Concern for cardiac etiology. Will check Stress test and echocardiogram. Continue medications as directed. Supportive measures and ER precautions reviewed.  - EKG 12-Lead - CBC - Comp Met (CMET) - TSH - Lipid panel - Myocardial Perfusion Imaging; Future - ECHOCARDIOGRAM COMPLETE; Future   [name], PA-C

## 2016-04-25 ENCOUNTER — Telehealth (HOSPITAL_COMMUNITY): Payer: Self-pay | Admitting: *Deleted

## 2016-04-25 NOTE — Telephone Encounter (Signed)
Patient given detailed instructions per Myocardial Perfusion Study Information Sheet for the test on 04/30/16. Patient notified to arrive 15 minutes early and that it is imperative to arrive on time for appointment to keep from having the test rescheduled.  If you need to cancel or reschedule your appointment, please call the office within 24 hours of your appointment. Failure to do so may result in a cancellation of your appointment, and a $50 no show fee. Patient verbalized understanding. Woodrow Dulski J Naijah Lacek, RN   

## 2016-04-30 ENCOUNTER — Ambulatory Visit (HOSPITAL_COMMUNITY): Payer: Medicare HMO | Attending: Internal Medicine

## 2016-04-30 ENCOUNTER — Encounter (INDEPENDENT_AMBULATORY_CARE_PROVIDER_SITE_OTHER): Payer: Self-pay

## 2016-04-30 ENCOUNTER — Encounter (HOSPITAL_COMMUNITY): Payer: Medicare HMO

## 2016-04-30 ENCOUNTER — Ambulatory Visit (HOSPITAL_BASED_OUTPATIENT_CLINIC_OR_DEPARTMENT_OTHER): Payer: Medicare HMO

## 2016-04-30 DIAGNOSIS — I313 Pericardial effusion (noninflammatory): Secondary | ICD-10-CM | POA: Insufficient documentation

## 2016-04-30 DIAGNOSIS — E785 Hyperlipidemia, unspecified: Secondary | ICD-10-CM | POA: Diagnosis not present

## 2016-04-30 DIAGNOSIS — R0602 Shortness of breath: Secondary | ICD-10-CM | POA: Diagnosis not present

## 2016-04-30 DIAGNOSIS — I119 Hypertensive heart disease without heart failure: Secondary | ICD-10-CM | POA: Insufficient documentation

## 2016-04-30 DIAGNOSIS — R0600 Dyspnea, unspecified: Secondary | ICD-10-CM | POA: Insufficient documentation

## 2016-04-30 DIAGNOSIS — R5383 Other fatigue: Secondary | ICD-10-CM | POA: Diagnosis not present

## 2016-04-30 LAB — MYOCARDIAL PERFUSION IMAGING
CHL CUP NUCLEAR SRS: 7
CHL CUP RESTING HR STRESS: 68 {beats}/min
LHR: 0.34
LV sys vol: 47 mL
LVDIAVOL: 112 mL (ref 62–150)
NUC STRESS TID: 0.85
Peak HR: 102 {beats}/min
SDS: 0
SSS: 7

## 2016-04-30 MED ORDER — TECHNETIUM TC 99M TETROFOSMIN IV KIT
32.1000 | PACK | Freq: Once | INTRAVENOUS | Status: AC | PRN
  Administered 2016-04-30: 32.1 via INTRAVENOUS
  Filled 2016-04-30: qty 32

## 2016-04-30 MED ORDER — REGADENOSON 0.4 MG/5ML IV SOLN
0.4000 mg | Freq: Once | INTRAVENOUS | Status: AC
  Administered 2016-04-30: 0.4 mg via INTRAVENOUS

## 2016-04-30 MED ORDER — TECHNETIUM TC 99M TETROFOSMIN IV KIT
10.2000 | PACK | Freq: Once | INTRAVENOUS | Status: AC | PRN
  Administered 2016-04-30: 10 via INTRAVENOUS
  Filled 2016-04-30: qty 10

## 2016-05-01 ENCOUNTER — Telehealth: Payer: Self-pay | Admitting: Physician Assistant

## 2016-05-01 ENCOUNTER — Ambulatory Visit (HOSPITAL_COMMUNITY): Payer: Medicare HMO

## 2016-05-01 DIAGNOSIS — I313 Pericardial effusion (noninflammatory): Secondary | ICD-10-CM

## 2016-05-01 DIAGNOSIS — I3139 Other pericardial effusion (noninflammatory): Secondary | ICD-10-CM

## 2016-05-01 DIAGNOSIS — R0602 Shortness of breath: Secondary | ICD-10-CM

## 2016-05-01 NOTE — Telephone Encounter (Signed)
Spoke with patient concerning echocardiogram and stress results. Stress test low-risk study. EF at 58%. Echo reveals mild LVH, Grade I Diastolic dysfunction, but preserved systolic function. Trivial effusion noted. Patient denies any SOB at rest, lightheadedness or dizziness, only the SOB with significant exertion. Giving findings on echo will set up with a Cardiologist for further assessment.

## 2016-05-02 ENCOUNTER — Encounter: Payer: Self-pay | Admitting: *Deleted

## 2016-05-02 NOTE — Progress Notes (Signed)
Cardiology Office Note   Date:  05/03/2016   ID:  Hector Edwards, DOB 25-Mar-1946, MRN [id number]  PCP:  [name], PA-C    No chief complaint on file. SHOB   Wt Readings from Last 3 Encounters:  05/03/16 286 lb (129.7 kg)  04/16/16 284 lb 4 oz (128.9 kg)  08/29/15 287 lb (130.2 kg)       History of Present Illness: Hector Edwards is a 70 y.o. male  Who has had SHOB over the past several months.   It is worse with exertion.  He had several friends who have had blockages so he wanted to be checked out. He had normal LV function by echo and a normal stress test.  He was more active about a year ago, but then stopped.    He does not exercise on a regular basis.  He had increased weight > 300 lbs.  He changed his diet and has lost 16 lbs.  His BP came down, but he did not feel that he had any more energy.  He denies chest pain that feels like indigestion.  It relieves with belching.  He has never been a heavy smoker.  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  He does not feel chest discomfort with that activity.  Overall, he felt more reassurance after having the stress test and echo results. He wants to try to exercise more. He has a treadmill at home. He wants to get his weight down to 250 pounds.  Father had an MI at a young age but lived to 65.      Past Medical History:  Diagnosis Date  . Depression   . GERD (gastroesophageal reflux disease)   . Hx of adenomatous colonic polyps 09/04/2015  . Hyperlipidemia   . Hypertension   . LBP (low back pain)     Past Surgical History:  Procedure Laterality Date  . COLONOSCOPY  2004   hyperplastic polyps x 2  . KNEE ARTHROSCOPY Right   . SHOULDER ARTHROSCOPY W/ ROTATOR CUFF REPAIR    . TONSILLECTOMY    . vocal cord growth removed       Current Outpatient Prescriptions  Medication Sig Dispense Refill  . amLODipine-benazepril (LOTREL) 10-20 MG capsule TAKE 1 CAPSULE BY MOUTH DAILY. 90  capsule 1  . aspirin 81 MG tablet Take 81 mg by mouth daily.    Marland Kitchen atorvastatin (LIPITOR) 10 MG tablet Take 1 tablet (10 mg total) by mouth daily. 30 tablet 1  . naproxen sodium (ANAPROX) 220 MG tablet Take 440 mg by mouth at bedtime.     . pantoprazole (PROTONIX) 40 MG tablet TAKE 1 TABLET BY MOUTH EVERY DAY 90 tablet 1  . Probiotic Product (PROBIOTIC & ACIDOPHILUS EX ST PO) Take 1 tablet by mouth daily.     . Red Yeast Rice Extract (RED YEAST RICE PO) Take 1 tablet by mouth daily.     . sildenafil (VIAGRA) 100 MG tablet Take 1 tablet (100 mg total) by mouth daily as needed for erectile dysfunction. Try taking 1/2 pill first and see if it gives desired effect. 5 tablet 11   No current facility-administered medications for this visit.     Allergies:   Enalapril maleate; Hctz [hydrochlorothiazide]; Tribenzor [olmesartan-amlodipine-hctz]; and Verapamil    Social History:  The patient  reports that he has never smoked. He has never used smokeless tobacco. He reports that he drinks about 2.4 oz of alcohol per  week . He reports that he does not use drugs.   Family History:  The patient's family history includes Breast cancer in his daughter; Hypertension in his other; Pancreatic cancer in his brother; Throat cancer in his brother.    ROS:  Please see the history of present illness.   Otherwise, review of systems are positive for DOE.   All other systems are reviewed and negative.    PHYSICAL EXAM: VS:  BP 110/70   Pulse 90   Ht 6\' 4"  (1.93 m)   Wt 286 lb (129.7 kg)   BMI 34.81 kg/m  , BMI Body mass index is 34.81 kg/m. GEN: Well nourished, well developed, in no acute distress  HEENT: normal  Neck: no JVD, carotid bruits, or masses Cardiac: RRR; no murmurs, rubs, or gallops,no edema  Respiratory:  clear to auscultation bilaterally, normal work of breathing GI: soft, nontender, nondistended, + BS MS: no deformity or atrophy  Skin: warm and dry, no rash Neuro:  Strength and sensation  are intact Psych: euthymic mood, full affect   EKG:   The prior ekg ordered demonstrates NSR, no ST segment changes   Recent Labs: 04/16/2016: ALT 45; BUN 18; Creatinine, Ser 0.85; Hemoglobin 16.1; Platelets 194.0; Potassium 4.0; Sodium 138; TSH 2.06   Lipid Panel    Component Value Date/Time   CHOL 165 04/16/2016 1133   TRIG 80.0 04/16/2016 1133   HDL 35.10 (L) 04/16/2016 1133   CHOLHDL 5 04/16/2016 1133   VLDL 16.0 04/16/2016 1133   LDLCALC 114 (H) 04/16/2016 1133   LDLDIRECT 158.5 10/19/2008 0858     Other studies Reviewed: Additional studies/ records that were reviewed today with results demonstrating: Stress test and echo reviewed.   ASSESSMENT AND PLAN:  1. Dyspnea on exertion: No evidence of structural heart disease or ischemia by stress testing or echocardiogram. This may be related to deconditioning as his amount of exercise is decreased over the past year.  We discussed that stress tests are good test but not perfect. He is trying to exercise more and lose weight. If his shortness of breath gets worse or he develops any chest discomfort, we could consider coronary angiogram. We discussed this in detail. At this point, we'll not plan for any further testing. He is agreeable to this. He feels better after having a normal test results. He'll let us know if symptoms change or do not improve with more exercise. 2. Obesity: He has lost about 15 pounds. I encouraged him to continue to try to lose weight by dietary modification including minimizing carbohydrates in potatoes, white bread, alcohol. 3. Hypertension: Blood pressure well controlled. Continue current medicines. 4. Continue low-dose aspirin. He was recently started on atorvastatin. This is followed by his primary care doctor. 5. Pericardial effusion noted on echocardiogram. It was only trivial. No further workup needed.   Current medicines are reviewed at length with the patient today.  The patient concerns regarding  his medicines were addressed.  The following changes have been made:  No change  Labs/ tests ordered today include:  No orders of the defined types were placed in this encounter.   Recommend 150 minutes/week of aerobic exercise Low fat, low carb, high fiber diet recommended  Disposition:   FU in 1 year   Signed, Larae Grooms, MD  05/03/2016 8:21 AM    [facility] [place], [place], [place]  [address] Phone: [phone]; Fax: [phone]

## 2016-05-03 ENCOUNTER — Encounter (INDEPENDENT_AMBULATORY_CARE_PROVIDER_SITE_OTHER): Payer: Self-pay

## 2016-05-03 ENCOUNTER — Encounter: Payer: Self-pay | Admitting: Interventional Cardiology

## 2016-05-03 ENCOUNTER — Ambulatory Visit (INDEPENDENT_AMBULATORY_CARE_PROVIDER_SITE_OTHER): Payer: Medicare HMO | Admitting: Interventional Cardiology

## 2016-05-03 VITALS — BP 110/70 | HR 90 | Ht 76.0 in | Wt 286.0 lb

## 2016-05-03 DIAGNOSIS — I10 Essential (primary) hypertension: Secondary | ICD-10-CM

## 2016-05-03 DIAGNOSIS — I313 Pericardial effusion (noninflammatory): Secondary | ICD-10-CM

## 2016-05-03 DIAGNOSIS — I319 Disease of pericardium, unspecified: Secondary | ICD-10-CM | POA: Diagnosis not present

## 2016-05-03 DIAGNOSIS — I3139 Other pericardial effusion (noninflammatory): Secondary | ICD-10-CM

## 2016-05-03 DIAGNOSIS — E785 Hyperlipidemia, unspecified: Secondary | ICD-10-CM | POA: Diagnosis not present

## 2016-05-03 DIAGNOSIS — E669 Obesity, unspecified: Secondary | ICD-10-CM

## 2016-05-03 NOTE — Patient Instructions (Signed)
**Note De-identified Hila Bolding Obfuscation** Medication Instructions:  Same-no changes  Labwork: None  Testing/Procedures: None  Follow-Up: Your physician wants you to follow-up in: 1 year. You will receive a reminder letter in the mail two months in advance. If you don't receive a letter, please call our office to schedule the follow-up appointment.      If you need a refill on your cardiac medications before your next appointment, please call your pharmacy.   

## 2016-11-13 ENCOUNTER — Telehealth: Payer: Self-pay | Admitting: Physician Assistant

## 2016-11-13 ENCOUNTER — Encounter: Payer: Self-pay | Admitting: Physician Assistant

## 2016-11-13 ENCOUNTER — Ambulatory Visit (INDEPENDENT_AMBULATORY_CARE_PROVIDER_SITE_OTHER): Payer: Medicare HMO | Admitting: Physician Assistant

## 2016-11-13 VITALS — BP 162/92 | HR 89 | Temp 98.3°F | Resp 16 | Ht 76.0 in | Wt 295.0 lb

## 2016-11-13 DIAGNOSIS — Z125 Encounter for screening for malignant neoplasm of prostate: Secondary | ICD-10-CM | POA: Diagnosis not present

## 2016-11-13 DIAGNOSIS — I10 Essential (primary) hypertension: Secondary | ICD-10-CM | POA: Diagnosis not present

## 2016-11-13 DIAGNOSIS — R351 Nocturia: Secondary | ICD-10-CM

## 2016-11-13 DIAGNOSIS — Z0000 Encounter for general adult medical examination without abnormal findings: Secondary | ICD-10-CM | POA: Diagnosis not present

## 2016-11-13 DIAGNOSIS — E785 Hyperlipidemia, unspecified: Secondary | ICD-10-CM | POA: Diagnosis not present

## 2016-11-13 DIAGNOSIS — Z23 Encounter for immunization: Secondary | ICD-10-CM | POA: Diagnosis not present

## 2016-11-13 DIAGNOSIS — N401 Enlarged prostate with lower urinary tract symptoms: Secondary | ICD-10-CM

## 2016-11-13 LAB — COMPREHENSIVE METABOLIC PANEL
ALBUMIN: 4.4 g/dL (ref 3.5–5.2)
ALT: 51 U/L (ref 0–53)
AST: 30 U/L (ref 0–37)
Alkaline Phosphatase: 53 U/L (ref 39–117)
BUN: 14 mg/dL (ref 6–23)
CALCIUM: 9.4 mg/dL (ref 8.4–10.5)
CHLORIDE: 103 meq/L (ref 96–112)
CO2: 29 meq/L (ref 19–32)
Creatinine, Ser: 0.86 mg/dL (ref 0.40–1.50)
GFR: 93.91 mL/min (ref >60.00)
Glucose, Bld: 116 mg/dL — ABNORMAL HIGH (ref 70–99)
POTASSIUM: 4.6 meq/L (ref 3.5–5.1)
Sodium: 138 mEq/L (ref 135–145)
Total Bilirubin: 0.6 mg/dL (ref 0.2–1.2)
Total Protein: 7.4 g/dL (ref 6.0–8.3)

## 2016-11-13 LAB — URINALYSIS, ROUTINE W REFLEX MICROSCOPIC
BILIRUBIN URINE: NEGATIVE
HGB URINE DIPSTICK: NEGATIVE
KETONES UR: NEGATIVE
LEUKOCYTES UA: NEGATIVE
NITRITE: NEGATIVE
PH: 6.5 (ref 5.0–8.0)
RBC / HPF: NONE SEEN (ref 0–?)
Specific Gravity, Urine: 1.02 (ref 1.000–1.030)
Total Protein, Urine: NEGATIVE
Urine Glucose: NEGATIVE
Urobilinogen, UA: 0.2 (ref 0.0–1.0)

## 2016-11-13 LAB — LIPID PANEL
CHOL/HDL RATIO: 4
CHOLESTEROL: 166 mg/dL (ref 0–200)
HDL: 42.7 mg/dL (ref >39.00)
LDL CALC: 105 mg/dL — AB (ref 0–99)
NonHDL: 122.82
TRIGLYCERIDES: 87 mg/dL (ref 0.0–149.0)
VLDL: 17.4 mg/dL (ref 0.0–40.0)

## 2016-11-13 LAB — HEMOGLOBIN A1C: Hgb A1c MFr Bld: 5.6 % (ref 4.6–6.5)

## 2016-11-13 LAB — CBC
HCT: 46.3 % (ref 39.0–52.0)
HEMOGLOBIN: 15.8 g/dL (ref 13.0–17.0)
MCHC: 34.2 g/dL (ref 30.0–36.0)
MCV: 97.5 fl (ref 78.0–100.0)
Platelets: 224 10*3/uL (ref 150.0–400.0)
RBC: 4.75 Mil/uL (ref 4.22–5.81)
RDW: 12.9 % (ref 11.5–15.5)
WBC: 6 10*3/uL (ref 4.0–10.5)

## 2016-11-13 LAB — PSA, MEDICARE: PSA: 1.42 ng/ml (ref 0.10–4.00)

## 2016-11-13 LAB — TSH: TSH: 2.36 u[IU]/mL (ref 0.35–4.50)

## 2016-11-13 MED ORDER — RANITIDINE HCL 150 MG PO TABS: 150.0000 mg | ORAL_TABLET | Freq: Every day | ORAL | 1 refills | Status: DC

## 2016-11-13 MED ORDER — AMLODIPINE BESY-BENAZEPRIL HCL 10-20 MG PO CAPS: 1.0000 | ORAL_CAPSULE | Freq: Every day | ORAL | 1 refills | Status: DC

## 2016-11-13 MED ORDER — PANTOPRAZOLE SODIUM 40 MG PO TBEC: 40.0000 mg | DELAYED_RELEASE_TABLET | Freq: Every day | ORAL | 1 refills | Status: DC

## 2016-11-13 NOTE — Progress Notes (Signed)
Pre visit review using our clinic review tool, if applicable. No additional management support is needed unless otherwise documented below in the visit note. 

## 2016-11-13 NOTE — Telephone Encounter (Signed)
Error

## 2016-11-13 NOTE — Patient Instructions (Signed)
Please go to the lab for blood work. I will call you with your results.  Please restart chronic medications. I have added on a nightly zantac to your daytime protonix. Follow the diet below for acid reflux.  Follow-up with me in 2 weeks for reassessment of acid reflux symptoms and hypertension.  Increase aerobic activity -- please start the Stella for Gastroesophageal Reflux Disease, Adult When you have gastroesophageal reflux disease (GERD), the foods you eat and your eating habits are very important. Choosing the right foods can help ease your discomfort. What guidelines do I need to follow?  Choose fruits, vegetables, whole grains, and low-fat dairy products.  Choose low-fat meat, fish, and poultry.  Limit fats such as oils, salad dressings, butter, nuts, and avocado.  Keep a food diary. This helps you identify foods that cause symptoms.  Avoid foods that cause symptoms. These may be different for everyone.  Eat small meals often instead of 3 large meals a day.  Eat your meals slowly, in a place where you are relaxed.  Limit fried foods.  Cook foods using methods other than frying.  Avoid drinking alcohol.  Avoid drinking large amounts of liquids with your meals.  Avoid bending over or lying down until 2-3 hours after eating. What foods are not recommended? These are some foods and drinks that may make your symptoms worse: Vegetables  Tomatoes. Tomato juice. Tomato and spaghetti sauce. Chili peppers. Onion and garlic. Horseradish. Fruits  Oranges, grapefruit, and lemon (fruit and juice). Meats  High-fat meats, fish, and poultry. This includes hot dogs, ribs, ham, sausage, salami, and bacon. Dairy  Whole milk and chocolate milk. Sour cream. Cream. Butter. Ice cream. Cream cheese. Drinks  Coffee and tea. Bubbly (carbonated) drinks or energy drinks. Condiments  Hot sauce. Barbecue sauce. Sweets/Desserts  Chocolate and cocoa.  Donuts. Peppermint and spearmint. Fats and Oils  High-fat foods. This includes [place] fries and potato chips. Other  Vinegar. Strong spices. This includes black pepper, white pepper, red pepper, cayenne, curry powder, cloves, ginger, and chili powder. The items listed above may not be a complete list of foods and drinks to avoid. Contact your dietitian for more information.  This information is not intended to replace advice given to you by your health care provider. Make sure you discuss any questions you have with your health care provider. Document Released: 03/25/2012 Document Revised: 03/01/2016 Document Reviewed: 07/29/2013 Elsevier Interactive Patient Education  2017 [place] 69 Years and Older, Male Preventive care refers to lifestyle choices and visits with your health care provider that can promote health and wellness. What does preventive care include?  A yearly physical exam. This is also called an annual well check.  Dental exams once or twice a year.  Routine eye exams. Ask your health care provider how often you should have your eyes checked.  Personal lifestyle choices, including:  Daily care of your teeth and gums.  Regular physical activity.  Eating a healthy diet.  Avoiding tobacco and drug use.  Limiting alcohol use.  Practicing safe sex.  Taking low doses of aspirin every day.  Taking vitamin and mineral supplements as recommended by your health care provider. What happens during an annual well check? The services and screenings done by your health care provider during your annual well check will depend on your age, overall health, lifestyle risk factors, and family history of disease. Counseling  Your health care provider may ask  you questions about your:  Alcohol use.  Tobacco use.  Drug use.  Emotional well-being.  Home and relationship well-being.  Sexual activity.  Eating habits.  History of falls.  Memory  and ability to understand (cognition).  Work and work Statistician. Screening  You may have the following tests or measurements:  Height, weight, and BMI.  Blood pressure.  Lipid and cholesterol levels. These may be checked every 5 years, or more frequently if you are over 40 years old.  Skin check.  Lung cancer screening. You may have this screening every year starting at age 54 if you have a 30-pack-year history of smoking and currently smoke or have quit within the past 15 years.  Fecal occult blood test (FOBT) of the stool. You may have this test every year starting at age 52.  Flexible sigmoidoscopy or colonoscopy. You may have a sigmoidoscopy every 5 years or a colonoscopy every 10 years starting at age 70.  Prostate cancer screening. Recommendations will vary depending on your family history and other risks.  Hepatitis C blood test.  Hepatitis B blood test.  Sexually transmitted disease (STD) testing.  Diabetes screening. This is done by checking your blood sugar (glucose) after you have not eaten for a while (fasting). You may have this done every 1-3 years.  Abdominal aortic aneurysm (AAA) screening. You may need this if you are a current or former smoker.  Osteoporosis. You may be screened starting at age 62 if you are at high risk. Talk with your health care provider about your test results, treatment options, and if necessary, the need for more tests. Vaccines  Your health care provider may recommend certain vaccines, such as:  Influenza vaccine. This is recommended every year.  Tetanus, diphtheria, and acellular pertussis (Tdap, Td) vaccine. You may need a Td booster every 10 years.  Varicella vaccine. You may need this if you have not been vaccinated.  Zoster vaccine. You may need this after age 41.  Measles, mumps, and rubella (MMR) vaccine. You may need at least one dose of MMR if you were born in 1957 or later. You may also need a second  dose.  Pneumococcal 13-valent conjugate (PCV13) vaccine. One dose is recommended after age 51.  Pneumococcal polysaccharide (PPSV23) vaccine. One dose is recommended after age 62.  Meningococcal vaccine. You may need this if you have certain conditions.  Hepatitis A vaccine. You may need this if you have certain conditions or if you travel or work in places where you may be exposed to hepatitis A.  Hepatitis B vaccine. You may need this if you have certain conditions or if you travel or work in places where you may be exposed to hepatitis B.  Haemophilus influenzae type b (Hib) vaccine. You may need this if you have certain risk factors. Talk to your health care provider about which screenings and vaccines you need and how often you need them. This information is not intended to replace advice given to you by your health care provider. Make sure you discuss any questions you have with your health care provider. Document Released: 10/21/2015 Document Revised: 06/13/2016 Document Reviewed: 07/26/2015 Elsevier Interactive Patient Education  2017 [employer].

## 2016-11-13 NOTE — Progress Notes (Signed)
Subjective:   Hector Edwards is a 70 y.o. male who presents for Medicare Annual/Subsequent preventive examination.  Hypertension -- Patient is currently on a regimen of Lotrel, previously controlling hypertension well. Has been out of medications for two weeks. Patient denies chest pain, palpitations, lightheadedness, dizziness, vision changes or frequent headaches.  BP Readings from Last 3 Encounters:  12/04/16 124/80  11/13/16 (!) 162/92  05/03/16 110/70   Hyperlipidemia -- Followed by Cardiology. Patient is currently on Lipitor 10 mg and 81 mg ASA daily. Is trying to watch diet. Does not exercise presently but is going to start a [employer] program with his sister.   GERD -- out of Protonix for 2 weeks. Notes significant recurrence of heart burn and night time cough since being out of medication. Is trying to watch out late-night eating. Denies nausea or vomiting.   Review of Systems:  Review of Systems  Constitutional: Negative for fever and weight loss.  HENT: Negative for ear discharge, ear pain, hearing loss and tinnitus.   Eyes: Negative for blurred vision, double vision, photophobia and pain.  Respiratory: Negative for cough and shortness of breath.   Cardiovascular: Negative for chest pain and palpitations.  Gastrointestinal: Positive for heartburn. Negative for abdominal pain, blood in stool, constipation, diarrhea, melena, nausea and vomiting.  Genitourinary: Negative for dysuria, flank pain, frequency, hematuria and urgency.       Nocturia x 3-4.  Denies hesitancy + hx ED  Musculoskeletal: Negative for falls.  Neurological: Negative for dizziness, loss of consciousness and headaches.  Endo/Heme/Allergies: Negative for environmental allergies.  Psychiatric/Behavioral: Negative for depression, hallucinations, substance abuse and suicidal ideas. The patient is not nervous/anxious and does not have insomnia.     Objective:    Vitals: BP (!) 162/92   Pulse 89    Temp 98.3 F (36.8 C) (Oral)   Resp 16   Ht 6\' 4"  (1.93 m)   Wt 295 lb (133.8 kg)   SpO2 94%   BMI 35.91 kg/m   Body mass index is 35.91 kg/m.   Physical Exam  Constitutional: He is oriented to person, place, and time and well-developed, well-nourished, and in no distress.  HENT:  Head: Normocephalic and atraumatic.  Right Ear: External ear normal.  Left Ear: External ear normal.  Nose: Nose normal.  Mouth/Throat: Oropharynx is clear and moist. No oropharyngeal exudate.  Eyes: Conjunctivae and EOM are normal. Pupils are equal, round, and reactive to light.  Neck: Neck supple. No thyromegaly present.  Cardiovascular: Normal rate, regular rhythm, normal heart sounds and intact distal pulses.   Pulmonary/Chest: Effort normal and breath sounds normal. No respiratory distress. He has no wheezes. He has no rales. He exhibits no tenderness.  Abdominal: Soft. Bowel sounds are normal. He exhibits no distension and no mass. There is no tenderness. There is no rebound and no guarding.  Genitourinary: Testes/scrotum normal and penis normal. Prostate is enlarged. Prostate is not tender. No discharge found.  Lymphadenopathy:    He has no cervical adenopathy.  Neurological: He is alert and oriented to person, place, and time.  Skin: Skin is warm and dry. No rash noted.  Psychiatric: Affect normal.  Vitals reviewed.  Tobacco History  Smoking Status  . Never Smoker  Smokeless Tobacco  . Never Used     Counseling given: Not Answered   Past Medical History:  Diagnosis Date  . Depression   . GERD (gastroesophageal reflux disease)   . Hx of adenomatous colonic polyps 09/04/2015  .  Hyperlipidemia   . Hypertension   . LBP (low back pain)    Past Surgical History:  Procedure Laterality Date  . COLONOSCOPY  2004   hyperplastic polyps x 2  . KNEE ARTHROSCOPY Right   . SHOULDER ARTHROSCOPY W/ ROTATOR CUFF REPAIR    . TONSILLECTOMY    . vocal cord growth removed     Family History    Problem Relation Age of Onset  . Breast cancer Daughter   . Hypertension Other   . Pancreatic cancer Brother   . Throat cancer Brother   . Colon cancer Neg Hx   . Esophageal cancer Neg Hx   . Stomach cancer Neg Hx   . Rectal cancer Neg Hx    History  Sexual Activity  . Sexual activity: Not Currently    Outpatient Encounter Prescriptions as of 11/13/2016  Medication Sig  . amLODipine-benazepril (LOTREL) 10-20 MG capsule Take 1 capsule by mouth daily.  Marland Kitchen aspirin 81 MG tablet Take 81 mg by mouth daily.  . naproxen sodium (ANAPROX) 220 MG tablet Take 440 mg by mouth at bedtime.   . pantoprazole (PROTONIX) 40 MG tablet Take 1 tablet (40 mg total) by mouth daily.  . Saccharomyces boulardii (PROBIOTIC) 250 MG CAPS Take 1 capsule by mouth daily.  . [DISCONTINUED] amLODipine-benazepril (LOTREL) 10-20 MG capsule TAKE 1 CAPSULE BY MOUTH DAILY.  . [DISCONTINUED] atorvastatin (LIPITOR) 10 MG tablet Take 1 tablet (10 mg total) by mouth daily.  . [DISCONTINUED] pantoprazole (PROTONIX) 40 MG tablet TAKE 1 TABLET BY MOUTH EVERY DAY  . ranitidine (ZANTAC) 150 MG tablet Take 1 tablet (150 mg total) by mouth at bedtime.  . [DISCONTINUED] Probiotic Product (PROBIOTIC & ACIDOPHILUS EX ST PO) Take 1 tablet by mouth daily.   . [DISCONTINUED] Red Yeast Rice Extract (RED YEAST RICE PO) Take 1 tablet by mouth daily.   . [DISCONTINUED] sildenafil (VIAGRA) 100 MG tablet Take 1 tablet (100 mg total) by mouth daily as needed for erectile dysfunction. Try taking 1/2 pill first and see if it gives desired effect.   No facility-administered encounter medications on file as of 11/13/2016.     Activities of Daily Living In your present state of health, do you have any difficulty performing the following activities: 11/13/2016  Hearing? N  Vision? N  Difficulty concentrating or making decisions? N  Walking or climbing stairs? N  Dressing or bathing? N  Doing errands, shopping? N  Some recent data might be hidden     Patient Care Team: Brunetta Jeans, PA-C as PCP - General (Physician Assistant) Gatha Mayer, MD as Consulting Physician (Gastroenterology) Druscilla Brownie, MD as Consulting Physician (Dermatology) Jettie Booze, MD as Consulting Physician (Cardiology)  ]  Assessment:    (1) Medicare Wellness, Subsequent (2) Annual Physical Examination (3) Hypertension (4) Hyperlipidemia (5) GERD (6) BPH with nocturia  Exercise Activities and Dietary recommendations Current Exercise Habits: The patient does not participate in regular exercise at present  Goals    . Increase physical activity          Increase walking.        Fall Risk Fall Risk  11/13/2016 11/13/2016 06/20/2015  Falls in the past year? No No No   Depression Screen PHQ 2/9 Scores 11/13/2016 11/13/2016 11/13/2016 06/20/2015  PHQ - 2 Score 0 0 0 0  PHQ- 9 Score 0 - 0 -    Cognitive Function MMSE - Mini Mental State Exam 11/13/2016 06/20/2015  Orientation to time 5 5  Orientation to Place 5 5  Registration 3 3  Attention/ Calculation 5 5  Recall 3 3  Language- name 2 objects 2 2  Language- repeat 1 1  Language- follow 3 step command 3 3  Language- read & follow direction 1 1  Write a sentence 1 1  Copy design 1 1  Total score 30 30      Immunization History  Administered Date(s) Administered  . Influenza Split 08/08/2011  . Influenza,inj,Quad PF,36+ Mos 06/20/2015, 11/13/2016  . Pneumococcal Conjugate-13 11/02/2013  . Pneumococcal Polysaccharide-23 06/20/2015  . Tdap 11/02/2013  . Zoster 10/13/2009   Screening Tests Health Maintenance  Topic Date Due  . Hepatitis C Screening  Sep 30, 1946  . COLONOSCOPY  08/28/2018  . DTaP/Tdap/Td (2 - Td) 11/03/2023  . TETANUS/TDAP  11/03/2023  . INFLUENZA VACCINE  Completed  . PNA vac Low Risk Adult  Completed      Plan:    (1) During the course of the visit the patient was educated and counseled about the following appropriate screening and preventive services:    Vaccines to include Pneumoccal, Influenza, Hepatitis B, Td, Zostavax, HCV  Electrocardiogram  Cardiovascular Disease  Colorectal cancer screening  Diabetes screening  Prostate Cancer Screening  Glaucoma screening  Nutrition counseling   Smoking cessation counseling  (2) Depression screen negative. Health Maintenance reviewed. Preventive schedule discussed and handout given in AVS. Will obtain fasting labs today.  (3) Stable. Asymptomatic. Labs today. (4) Will repeat fasting labs today to assess.  (5) Will restart Protonix and try a QOD dosing to see if we can get control without need for QD dosing. (6) Discussed options with patient. Mild enlargement without nodule, bogginess or tenderness. PSA today. Patient is giving thought to medication options. IF PSA elevated or significant change from last check, will send to Urology.  Patient Instructions (the written plan) was given to the patient.    Raiford Noble [place], [place]  12/09/2016

## 2016-11-14 ENCOUNTER — Other Ambulatory Visit: Payer: Self-pay | Admitting: Emergency Medicine

## 2016-11-14 MED ORDER — ATORVASTATIN CALCIUM 10 MG PO TABS: 10.0000 mg | ORAL_TABLET | Freq: Every day | ORAL | 0 refills | Status: DC

## 2016-11-26 DIAGNOSIS — Z0000 Encounter for general adult medical examination without abnormal findings: Secondary | ICD-10-CM | POA: Insufficient documentation

## 2016-11-26 DIAGNOSIS — N401 Enlarged prostate with lower urinary tract symptoms: Secondary | ICD-10-CM | POA: Insufficient documentation

## 2016-11-26 DIAGNOSIS — Z125 Encounter for screening for malignant neoplasm of prostate: Secondary | ICD-10-CM | POA: Insufficient documentation

## 2016-11-26 DIAGNOSIS — R351 Nocturia: Secondary | ICD-10-CM

## 2016-12-04 ENCOUNTER — Encounter: Payer: Self-pay | Admitting: Physician Assistant

## 2016-12-04 ENCOUNTER — Ambulatory Visit (INDEPENDENT_AMBULATORY_CARE_PROVIDER_SITE_OTHER): Payer: Medicare HMO | Admitting: Physician Assistant

## 2016-12-04 VITALS — BP 124/80 | HR 98 | Temp 98.5°F | Resp 14 | Ht 76.0 in | Wt 290.0 lb

## 2016-12-04 DIAGNOSIS — N401 Enlarged prostate with lower urinary tract symptoms: Secondary | ICD-10-CM

## 2016-12-04 DIAGNOSIS — R351 Nocturia: Secondary | ICD-10-CM

## 2016-12-04 DIAGNOSIS — K219 Gastro-esophageal reflux disease without esophagitis: Secondary | ICD-10-CM

## 2016-12-04 MED ORDER — ATORVASTATIN CALCIUM 10 MG PO TABS: 10.0000 mg | ORAL_TABLET | Freq: Every day | ORAL | 0 refills | Status: DC

## 2016-12-04 MED ORDER — FLUTICASONE PROPIONATE 50 MCG/ACT NA SUSP: 2.0000 | Freq: Every day | NASAL | 0 refills | Status: DC

## 2016-12-04 MED ORDER — SILODOSIN 4 MG PO CAPS: 4.0000 mg | ORAL_CAPSULE | Freq: Every day | ORAL | 0 refills | Status: DC

## 2016-12-04 NOTE — Progress Notes (Signed)
Patient presents to clinic today for follow-up of GERD symptoms. Is taking Pantoprazole daily as directed. Occasional ranitidine at bedtime. Endorses complete resolution of symptoms with this regimen. Is watching diet and especially late-night eating. Denies epigastric discomfort, nausea or vomiting. Is staying well hydrated.  As discussed at physical, patient notes nocturia x 3-4 per night. Denies daytime frequency or urgency. Denies dysuria. Denies urinary hesitancy or incomplete bladder emptying. Does note occasional dribbling. Prostate exam at physical was within normal limits. PSA normal as well. Would like to discuss medications now for his symptoms.   Past Medical History:  Diagnosis Date  . Depression   . GERD (gastroesophageal reflux disease)   . Hx of adenomatous colonic polyps 09/04/2015  . Hyperlipidemia   . Hypertension   . LBP (low back pain)     Current Outpatient Prescriptions on File Prior to Visit  Medication Sig Dispense Refill  . amLODipine-benazepril (LOTREL) 10-20 MG capsule Take 1 capsule by mouth daily. 90 capsule 1  . aspirin 81 MG tablet Take 81 mg by mouth daily.    . naproxen sodium (ANAPROX) 220 MG tablet Take 440 mg by mouth at bedtime.     . pantoprazole (PROTONIX) 40 MG tablet Take 1 tablet (40 mg total) by mouth daily. 90 tablet 1  . ranitidine (ZANTAC) 150 MG tablet Take 1 tablet (150 mg total) by mouth at bedtime. 30 tablet 1  . Saccharomyces boulardii (PROBIOTIC) 250 MG CAPS Take 1 capsule by mouth daily.     No current facility-administered medications on file prior to visit.     Allergies  Allergen Reactions  . Enalapril Maleate     REACTION: cramps  . Hctz [Hydrochlorothiazide]     SOB  . Tribenzor [Olmesartan-Amlodipine-Hctz] Swelling    Fatigue, CP, edema  . Verapamil     REACTION: Fatigue    Family History  Problem Relation Age of Onset  . Breast cancer Daughter   . Hypertension Other   . Pancreatic cancer Brother   . Throat  cancer Brother   . Colon cancer Neg Hx   . Esophageal cancer Neg Hx   . Stomach cancer Neg Hx   . Rectal cancer Neg Hx     Social History   Social History  . Marital status: Divorced    Spouse name: N/A  . Number of children: N/A  . Years of education: N/A   Social History Main Topics  . Smoking status: Never Smoker  . Smokeless tobacco: Never Used  . Alcohol use 2.4 oz/week    4 Cans of beer per week  . Drug use: No  . Sexual activity: Not Currently   Other Topics Concern  . None   Social History Narrative   Regular exercise- yes, seldom; working physically all the time.   Review of Systems - See HPI.  All other ROS are negative.  BP 124/80   Pulse 98   Temp 98.5 F (36.9 C) (Oral)   Resp 14   Ht 6\' 4"  (1.93 m)   Wt 290 lb (131.5 kg)   SpO2 94%   BMI 35.30 kg/m   Physical Exam  Constitutional: He is oriented to person, place, and time and well-developed, well-nourished, and in no distress.  HENT:  Head: Normocephalic and atraumatic.  Eyes: Conjunctivae are normal.  Neck: Neck supple.  Cardiovascular: Normal rate, regular rhythm, normal heart sounds and intact distal pulses.   Pulmonary/Chest: Effort normal and breath sounds normal. No respiratory distress. He has no  wheezes. He has no rales. He exhibits no tenderness.  Lymphadenopathy:    He has no cervical adenopathy.  Neurological: He is alert and oriented to person, place, and time.  Skin: Skin is warm and dry. No rash noted.  Psychiatric: Affect normal.  Vitals reviewed.   Recent Results (from the past 2160 hour(s))  CBC     Status: None   Collection Time: 11/13/16 10:13 AM  Result Value Ref Range   WBC 6.0 4.0 - 10.5 K/uL   RBC 4.75 4.22 - 5.81 Mil/uL   Platelets 224.0 150.0 - 400.0 K/uL   Hemoglobin 15.8 13.0 - 17.0 g/dL   HCT 46.3 39.0 - 52.0 %   MCV 97.5 78.0 - 100.0 fl   MCHC 34.2 30.0 - 36.0 g/dL   RDW 12.9 11.5 - 15.5 %  Comprehensive metabolic panel     Status: Abnormal   Collection  Time: 11/13/16 10:13 AM  Result Value Ref Range   Sodium 138 135 - 145 mEq/L   Potassium 4.6 3.5 - 5.1 mEq/L   Chloride 103 96 - 112 mEq/L   CO2 29 19 - 32 mEq/L   Glucose, Bld 116 (H) 70 - 99 mg/dL   BUN 14 6 - 23 mg/dL   Creatinine, Ser 0.86 0.40 - 1.50 mg/dL   Total Bilirubin 0.6 0.2 - 1.2 mg/dL   Alkaline Phosphatase 53 39 - 117 U/L   AST 30 0 - 37 U/L   ALT 51 0 - 53 U/L   Total Protein 7.4 6.0 - 8.3 g/dL   Albumin 4.4 3.5 - 5.2 g/dL   Calcium 9.4 8.4 - 10.5 mg/dL   GFR 93.91 >60.00 mL/min  Lipid panel     Status: Abnormal   Collection Time: 11/13/16 10:13 AM  Result Value Ref Range   Cholesterol 166 0 - 200 mg/dL    Comment: ATP [name] Classification       Desirable:  < 200 mg/dL               Borderline High:  200 - 239 mg/dL          High:  > = 240 mg/dL   Triglycerides 87.0 0.0 - 149.0 mg/dL    Comment: Normal:  <150 mg/dLBorderline High:  150 - 199 mg/dL   HDL 42.70 >39.00 mg/dL   VLDL 17.4 0.0 - 40.0 mg/dL   LDL Cholesterol 105 (H) 0 - 99 mg/dL   Total CHOL/HDL Ratio 4     Comment:                Men          Women1/2 Average Risk     3.4          3.3Average Risk          5.0          4.42X Average Risk          9.6          7.13X Average Risk          15.0          11.0                       NonHDL 122.82     Comment: NOTE:  Non-HDL goal should be 30 mg/dL higher than patient's LDL goal (i.e. LDL goal of < 70 mg/dL, would have non-HDL goal of < 100 mg/dL)  Hemoglobin A1c  Status: None   Collection Time: 11/13/16 10:13 AM  Result Value Ref Range   Hgb A1c MFr Bld 5.6 4.6 - 6.5 %    Comment: Glycemic Control Guidelines for People with Diabetes:Non Diabetic:  <6%Goal of Therapy: <7%Additional Action Suggested:  >8%   TSH     Status: None   Collection Time: 11/13/16 10:13 AM  Result Value Ref Range   TSH 2.36 0.35 - 4.50 uIU/mL  Urinalysis, Routine w reflex microscopic     Status: Abnormal   Collection Time: 11/13/16 10:13 AM  Result Value Ref Range   Color, Urine  YELLOW Yellow;Lt. Yellow   APPearance CLEAR Clear   Specific Gravity, Urine 1.020 1.000 - 1.030   pH 6.5 5.0 - 8.0   Total Protein, Urine NEGATIVE Negative   Urine Glucose NEGATIVE Negative   Ketones, ur NEGATIVE Negative   Bilirubin Urine NEGATIVE Negative   Hgb urine dipstick NEGATIVE Negative   Urobilinogen, UA 0.2 0.0 - 1.0   Leukocytes, UA NEGATIVE Negative   Nitrite NEGATIVE Negative   WBC, UA 0-2/hpf 0-2/hpf   RBC / HPF none seen 0-2/hpf   Mucus, UA Presence of (A) None   Squamous Epithelial / LPF Rare(0-4/hpf) Rare(0-4/hpf)  PSA, Medicare     Status: None   Collection Time: 11/13/16 10:13 AM  Result Value Ref Range   PSA 1.42 0.10 - 4.00 ng/ml    Assessment/Plan: GERD (gastroesophageal reflux disease) Much improved with current regimen. Continue GERD diet and avoidance of late-night eating. Will first cut back on ranitidine. Continue Protonix for now. Discussed that he can also attempt to wean PPI to QOD dosing and see if that still controls symptoms. Will reassess at FU.   Benign prostatic hyperplasia with nocturia PSA within normal limits at CPE 2 weeks ago. DRE without nodule or noted significant enlargement. Will attempt trial of Rapaflo. FU scheduled.    [name], PA-C

## 2016-12-04 NOTE — Progress Notes (Signed)
Pre visit review using our clinic review tool, if applicable. No additional management support is needed unless otherwise documented below in the visit note. 

## 2016-12-04 NOTE — Patient Instructions (Addendum)
Please stay well hydrated and get plenty of rest. Take chronic medications as directed. Continue Mucinex or Mucinex-DM. Do not use Mucinex-D. Start the [place] and use as directed.  Call or return if symptoms are not resolving.  Please start the Rapaflo as directed. Stay well hydrated while taking this medication. Would recommend reclining in the chair after taking first dose of medication as you can get some lightheadedness with first dose.  Follow-up with me in 2-3 weeks.

## 2016-12-05 NOTE — Assessment & Plan Note (Signed)
PSA within normal limits at CPE 2 weeks ago. DRE without nodule or noted significant enlargement. Will attempt trial of Rapaflo. FU scheduled.

## 2016-12-05 NOTE — Assessment & Plan Note (Signed)
Much improved with current regimen. Continue GERD diet and avoidance of late-night eating. Will first cut back on ranitidine. Continue Protonix for now. Discussed that he can also attempt to wean PPI to QOD dosing and see if that still controls symptoms. Will reassess at FU.

## 2016-12-12 DIAGNOSIS — K219 Gastro-esophageal reflux disease without esophagitis: Secondary | ICD-10-CM | POA: Diagnosis not present

## 2016-12-12 DIAGNOSIS — H9112 Presbycusis, left ear: Secondary | ICD-10-CM | POA: Diagnosis not present

## 2016-12-12 DIAGNOSIS — Z6837 Body mass index (BMI) 37.0-37.9, adult: Secondary | ICD-10-CM | POA: Diagnosis not present

## 2016-12-12 DIAGNOSIS — M130 Polyarthritis, unspecified: Secondary | ICD-10-CM | POA: Diagnosis not present

## 2016-12-12 DIAGNOSIS — I10 Essential (primary) hypertension: Secondary | ICD-10-CM | POA: Diagnosis not present

## 2016-12-12 DIAGNOSIS — E669 Obesity, unspecified: Secondary | ICD-10-CM | POA: Diagnosis not present

## 2016-12-12 DIAGNOSIS — Z0000 Encounter for general adult medical examination without abnormal findings: Secondary | ICD-10-CM | POA: Diagnosis not present

## 2016-12-25 ENCOUNTER — Encounter: Payer: Self-pay | Admitting: Physician Assistant

## 2016-12-25 ENCOUNTER — Ambulatory Visit (INDEPENDENT_AMBULATORY_CARE_PROVIDER_SITE_OTHER): Payer: Medicare HMO | Admitting: Physician Assistant

## 2016-12-25 VITALS — BP 138/80 | HR 86 | Temp 98.2°F | Resp 16 | Ht 76.0 in | Wt 294.0 lb

## 2016-12-25 DIAGNOSIS — R351 Nocturia: Secondary | ICD-10-CM

## 2016-12-25 DIAGNOSIS — K219 Gastro-esophageal reflux disease without esophagitis: Secondary | ICD-10-CM

## 2016-12-25 DIAGNOSIS — N401 Enlarged prostate with lower urinary tract symptoms: Secondary | ICD-10-CM

## 2016-12-25 NOTE — Assessment & Plan Note (Signed)
Did not take Rapaflo due to cost. Wants to discussed herbal supplements. Will give trial of saw palmetto and or pumpkin seed oil to see if symptoms improve. If not will need to reconsider RX.

## 2016-12-25 NOTE — Progress Notes (Signed)
Pre visit review using our clinic review tool, if applicable. No additional management support is needed unless otherwise documented below in the visit note. 

## 2016-12-25 NOTE — Assessment & Plan Note (Signed)
Tolerating QOD dosing without breakthrough symptoms. Continue same. FU scheduled.

## 2016-12-25 NOTE — Patient Instructions (Signed)
Please use Flonase at least every other day for nasal congestion.  Please continue other medications as directed.  Try a pumpkin seed/oil supplement and/or Saw Palmetto to help with prostate symptoms. We can readdress other medications if symptoms are not improving.

## 2016-12-25 NOTE — Progress Notes (Signed)
Patient presents to clinic today for follow-up.  Pt is currently taking Protonix QOD and ranitidine at bedtime every day. Pt states medication regimen is working well. Denies abdominal pain, dysphagia, chest pain, palpitations. States eating dinner earlier in the night before lying down and avoiding spicy foods. Denies exercise due to weather.  Pt given prescription for Rapaflo at last visit but states that Rapaflo was $200 at pharmacy, so did not fill prescription. Interested in starting an herbal supplement. Reports still getting up 2-3 times a night to urinate. Denies dysuria. Denies daytime frequency.   Pt has no other acute concerns.  Past Medical History:  Diagnosis Date  . Depression   . GERD (gastroesophageal reflux disease)   . Hx of adenomatous colonic polyps 09/04/2015  . Hyperlipidemia   . Hypertension   . LBP (low back pain)     Current Outpatient Prescriptions on File Prior to Visit  Medication Sig Dispense Refill  . amLODipine-benazepril (LOTREL) 10-20 MG capsule Take 1 capsule by mouth daily. 90 capsule 1  . aspirin 81 MG tablet Take 81 mg by mouth daily.    Marland Kitchen atorvastatin (LIPITOR) 10 MG tablet Take 1 tablet (10 mg total) by mouth daily. 90 tablet 0  . fluticasone (FLONASE) 50 MCG/ACT nasal spray Place 2 sprays into both nostrils daily. 16 g 0  . naproxen sodium (ANAPROX) 220 MG tablet Take 440 mg by mouth at bedtime.     . pantoprazole (PROTONIX) 40 MG tablet Take 1 tablet (40 mg total) by mouth daily. 90 tablet 1  . ranitidine (ZANTAC) 150 MG tablet Take 1 tablet (150 mg total) by mouth at bedtime. 30 tablet 1  . Saccharomyces boulardii (PROBIOTIC) 250 MG CAPS Take 1 capsule by mouth daily.     No current facility-administered medications on file prior to visit.     Allergies  Allergen Reactions  . Enalapril Maleate     REACTION: cramps  . Hctz [Hydrochlorothiazide]     SOB  . Tribenzor [Olmesartan-Amlodipine-Hctz] Swelling    Fatigue, CP, edema  .  Verapamil     REACTION: Fatigue    Family History  Problem Relation Age of Onset  . Breast cancer Daughter   . Hypertension Other   . Pancreatic cancer Brother   . Throat cancer Brother   . Colon cancer Neg Hx   . Esophageal cancer Neg Hx   . Stomach cancer Neg Hx   . Rectal cancer Neg Hx     Social History   Social History  . Marital status: Divorced    Spouse name: N/A  . Number of children: N/A  . Years of education: N/A   Social History Main Topics  . Smoking status: Never Smoker  . Smokeless tobacco: Never Used  . Alcohol use 2.4 oz/week    4 Cans of beer per week  . Drug use: No  . Sexual activity: Not Currently   Other Topics Concern  . None   Social History Narrative   Regular exercise- yes, seldom; working physically all the time.   Review of Systems - See HPI.  All other ROS are negative.  BP 138/80   Pulse 86   Temp 98.2 F (36.8 C) (Oral)   Resp 16   Ht 6\' 4"  (1.93 m)   Wt 294 lb (133.4 kg)   SpO2 94%   BMI 35.79 kg/m   Physical Exam  Constitutional: He is oriented to person, place, and time and well-developed, well-nourished, and in no distress.  No distress.  HENT:  Right Ear: External ear and ear canal normal. Tympanic membrane is not erythematous. A middle ear effusion (serous) is present.  Left Ear: External ear and ear canal normal. Tympanic membrane is not erythematous. A middle ear effusion (serous) is present.  Nose: Mucosal edema present.  Mouth/Throat: Oropharynx is clear and moist. No oropharyngeal exudate.  Eyes: Conjunctivae are normal. Pupils are equal, round, and reactive to light. No scleral icterus.  Neck: Neck supple.  Cardiovascular: Normal rate, regular rhythm, normal heart sounds and intact distal pulses.  Exam reveals no gallop and no friction rub.   No murmur heard. Pulmonary/Chest: Effort normal and breath sounds normal. No respiratory distress. He has no wheezes. He has no rales.  Abdominal: Soft. Bowel sounds are  normal. He exhibits no distension and no mass. There is no tenderness. There is no rebound and no guarding.  Neurological: He is alert and oriented to person, place, and time.  Skin: Skin is warm and dry. He is not diaphoretic.  Psychiatric: Affect normal.  Vitals reviewed.  Recent Results (from the past 2160 hour(s))  CBC     Status: None   Collection Time: 11/13/16 10:13 AM  Result Value Ref Range   WBC 6.0 4.0 - 10.5 K/uL   RBC 4.75 4.22 - 5.81 Mil/uL   Platelets 224.0 150.0 - 400.0 K/uL   Hemoglobin 15.8 13.0 - 17.0 g/dL   HCT 46.3 39.0 - 52.0 %   MCV 97.5 78.0 - 100.0 fl   MCHC 34.2 30.0 - 36.0 g/dL   RDW 12.9 11.5 - 15.5 %  Comprehensive metabolic panel     Status: Abnormal   Collection Time: 11/13/16 10:13 AM  Result Value Ref Range   Sodium 138 135 - 145 mEq/L   Potassium 4.6 3.5 - 5.1 mEq/L   Chloride 103 96 - 112 mEq/L   CO2 29 19 - 32 mEq/L   Glucose, Bld 116 (H) 70 - 99 mg/dL   BUN 14 6 - 23 mg/dL   Creatinine, Ser 0.86 0.40 - 1.50 mg/dL   Total Bilirubin 0.6 0.2 - 1.2 mg/dL   Alkaline Phosphatase 53 39 - 117 U/L   AST 30 0 - 37 U/L   ALT 51 0 - 53 U/L   Total Protein 7.4 6.0 - 8.3 g/dL   Albumin 4.4 3.5 - 5.2 g/dL   Calcium 9.4 8.4 - 10.5 mg/dL   GFR 93.91 >60.00 mL/min  Lipid panel     Status: Abnormal   Collection Time: 11/13/16 10:13 AM  Result Value Ref Range   Cholesterol 166 0 - 200 mg/dL    Comment: ATP [name] Classification       Desirable:  < 200 mg/dL               Borderline High:  200 - 239 mg/dL          High:  > = 240 mg/dL   Triglycerides 87.0 0.0 - 149.0 mg/dL    Comment: Normal:  <150 mg/dLBorderline High:  150 - 199 mg/dL   HDL 42.70 >39.00 mg/dL   VLDL 17.4 0.0 - 40.0 mg/dL   LDL Cholesterol 105 (H) 0 - 99 mg/dL   Total CHOL/HDL Ratio 4     Comment:                Men          Women1/2 Average Risk     3.4  3.3Average Risk          5.0          4.42X Average Risk          9.6          7.13X Average Risk          15.0          11.0                        NonHDL 122.82     Comment: NOTE:  Non-HDL goal should be 30 mg/dL higher than patient's LDL goal (i.e. LDL goal of < 70 mg/dL, would have non-HDL goal of < 100 mg/dL)  Hemoglobin A1c     Status: None   Collection Time: 11/13/16 10:13 AM  Result Value Ref Range   Hgb A1c MFr Bld 5.6 4.6 - 6.5 %    Comment: Glycemic Control Guidelines for People with Diabetes:Non Diabetic:  <6%Goal of Therapy: <7%Additional Action Suggested:  >8%   TSH     Status: None   Collection Time: 11/13/16 10:13 AM  Result Value Ref Range   TSH 2.36 0.35 - 4.50 uIU/mL  Urinalysis, Routine w reflex microscopic     Status: Abnormal   Collection Time: 11/13/16 10:13 AM  Result Value Ref Range   Color, Urine YELLOW Yellow;Lt. Yellow   APPearance CLEAR Clear   Specific Gravity, Urine 1.020 1.000 - 1.030   pH 6.5 5.0 - 8.0   Total Protein, Urine NEGATIVE Negative   Urine Glucose NEGATIVE Negative   Ketones, ur NEGATIVE Negative   Bilirubin Urine NEGATIVE Negative   Hgb urine dipstick NEGATIVE Negative   Urobilinogen, UA 0.2 0.0 - 1.0   Leukocytes, UA NEGATIVE Negative   Nitrite NEGATIVE Negative   WBC, UA 0-2/hpf 0-2/hpf   RBC / HPF none seen 0-2/hpf   Mucus, UA Presence of (A) None   Squamous Epithelial / LPF Rare(0-4/hpf) Rare(0-4/hpf)  PSA, Medicare     Status: None   Collection Time: 11/13/16 10:13 AM  Result Value Ref Range   PSA 1.42 0.10 - 4.00 ng/ml    Assessment/Plan: GERD (gastroesophageal reflux disease) Tolerating QOD dosing without breakthrough symptoms. Continue same. FU scheduled.  Benign prostatic hyperplasia with nocturia Did not take Rapaflo due to cost. Wants to discussed herbal supplements. Will give trial of saw palmetto and or pumpkin seed oil to see if symptoms improve. If not will need to reconsider RX.     [name], PA-C

## 2017-03-13 ENCOUNTER — Other Ambulatory Visit: Payer: Self-pay | Admitting: Dermatology

## 2017-03-13 DIAGNOSIS — D485 Neoplasm of uncertain behavior of skin: Secondary | ICD-10-CM | POA: Diagnosis not present

## 2017-03-13 DIAGNOSIS — L570 Actinic keratosis: Secondary | ICD-10-CM | POA: Diagnosis not present

## 2017-03-13 DIAGNOSIS — D3610 Benign neoplasm of peripheral nerves and autonomic nervous system, unspecified: Secondary | ICD-10-CM | POA: Diagnosis not present

## 2017-03-13 DIAGNOSIS — D225 Melanocytic nevi of trunk: Secondary | ICD-10-CM | POA: Diagnosis not present

## 2017-03-13 DIAGNOSIS — D2111 Benign neoplasm of connective and other soft tissue of right upper limb, including shoulder: Secondary | ICD-10-CM | POA: Diagnosis not present

## 2017-03-13 DIAGNOSIS — L821 Other seborrheic keratosis: Secondary | ICD-10-CM | POA: Diagnosis not present

## 2017-03-13 DIAGNOSIS — L814 Other melanin hyperpigmentation: Secondary | ICD-10-CM | POA: Diagnosis not present

## 2017-05-29 IMAGING — NM NM MISC PROCEDURE
3 series · 18 of 18 positions shown · non-contrast
Comparison: none

[Series 1: wbr_s-proj_st stress_(id)_sa · 6.5mm · 6.51mm/px · 6 of 512 frames shown (1 of 2)]
[frame 43/512]
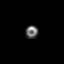
[frame 128/512]
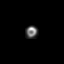
[frame 214/512]
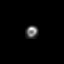
[frame 299/512]
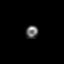
[frame 384/512]
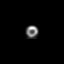
[frame 470/512]
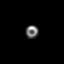

[Series 1: wbr_s-proj_st stress_(id)_sa · 6.5mm · 6.51mm/px · 6 of 64 frames shown (2 of 2)]
[frame 6/64]
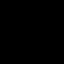
[frame 16/64]
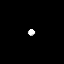
[frame 27/64]
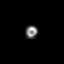
[frame 38/64]
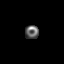
[frame 48/64]
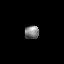
[frame 59/64]
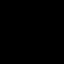

[Series 1: wbr_r-proj_st rest_(id)_sa · 6.5mm · 6.51mm/px · 6 of 64 frames shown]
[frame 6/64]
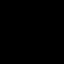
[frame 16/64]
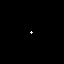
[frame 27/64]
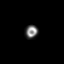
[frame 38/64]
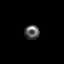
[frame 48/64]
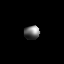
[frame 59/64]
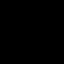

[18 of 18 positions shown; findings below may reference images not displayed]

Canned report from images found in remote index.

Refer to host system for actual result text.

## 2017-06-17 ENCOUNTER — Other Ambulatory Visit: Payer: Self-pay | Admitting: Physician Assistant

## 2017-06-24 ENCOUNTER — Ambulatory Visit (INDEPENDENT_AMBULATORY_CARE_PROVIDER_SITE_OTHER): Payer: Medicare HMO | Admitting: Physician Assistant

## 2017-06-24 ENCOUNTER — Encounter: Payer: Self-pay | Admitting: Physician Assistant

## 2017-06-24 VITALS — BP 138/86 | HR 83 | Temp 98.1°F | Resp 14 | Ht 76.0 in | Wt 302.0 lb

## 2017-06-24 DIAGNOSIS — Z23 Encounter for immunization: Secondary | ICD-10-CM | POA: Diagnosis not present

## 2017-06-24 DIAGNOSIS — K219 Gastro-esophageal reflux disease without esophagitis: Secondary | ICD-10-CM | POA: Diagnosis not present

## 2017-06-24 DIAGNOSIS — I10 Essential (primary) hypertension: Secondary | ICD-10-CM | POA: Diagnosis not present

## 2017-06-24 DIAGNOSIS — E785 Hyperlipidemia, unspecified: Secondary | ICD-10-CM

## 2017-06-24 LAB — LIPID PANEL
CHOLESTEROL: 102 mg/dL (ref 0–200)
HDL: 34.6 mg/dL — ABNORMAL LOW (ref >39.00)
LDL CALC: 54 mg/dL (ref 0–99)
NonHDL: 67.42
TRIGLYCERIDES: 68 mg/dL (ref 0.0–149.0)
Total CHOL/HDL Ratio: 3
VLDL: 13.6 mg/dL (ref 0.0–40.0)

## 2017-06-24 LAB — COMPREHENSIVE METABOLIC PANEL
ALBUMIN: 4.2 g/dL (ref 3.5–5.2)
ALK PHOS: 66 U/L (ref 39–117)
ALT: 42 U/L (ref 0–53)
AST: 26 U/L (ref 0–37)
BUN: 15 mg/dL (ref 6–23)
CALCIUM: 9.3 mg/dL (ref 8.4–10.5)
CHLORIDE: 103 meq/L (ref 96–112)
CO2: 28 mEq/L (ref 19–32)
Creatinine, Ser: 0.83 mg/dL (ref 0.40–1.50)
GFR: 97.66 mL/min (ref >60.00)
Glucose, Bld: 112 mg/dL — ABNORMAL HIGH (ref 70–99)
POTASSIUM: 3.9 meq/L (ref 3.5–5.1)
Sodium: 139 mEq/L (ref 135–145)
TOTAL PROTEIN: 7.1 g/dL (ref 6.0–8.3)
Total Bilirubin: 0.6 mg/dL (ref 0.2–1.2)

## 2017-06-24 NOTE — Progress Notes (Signed)
Pre visit review using our clinic review tool, if applicable. No additional management support is needed unless otherwise documented below in the visit note. 

## 2017-06-24 NOTE — Assessment & Plan Note (Signed)
Is currently on Lipitor. Noting arthralgias. Will recheck lipids and LFT today. Will hold statin x 1 week to see if there is improvement. Will make further changes according to response.

## 2017-06-24 NOTE — Assessment & Plan Note (Signed)
Doing very well presently. Will continue current regimen. Continue GERD diet.

## 2017-06-24 NOTE — Assessment & Plan Note (Signed)
BP stable. Asymptomatic. Repeat labs today. Continue current regimen. 

## 2017-06-24 NOTE — Patient Instructions (Signed)
Please stop the Lipitor over the next week. I want to see if this has an impact on your aches and pains. If improved, we will need to consider an alternative medication for your cholesterol. If no change, the medication is not likely to be contributing to these symptoms.  Please continue other medications as directed.  Call me in 1 week to let me know how symptoms are going.   Fat and Cholesterol Restricted Diet Getting too much fat and cholesterol in your diet may cause health problems. Following this diet helps keep your fat and cholesterol at normal levels. This can keep you from getting sick. What types of fat should I choose?  Choose monosaturated and polyunsaturated fats. These are found in foods such as olive oil, canola oil, flaxseeds, walnuts, almonds, and seeds.  Eat more omega-3 fats. Good choices include salmon, mackerel, sardines, tuna, flaxseed oil, and ground flaxseeds.  Limit saturated fats. These are in animal products such as meats, butter, and cream. They can also be in plant products such as palm oil, palm kernel oil, and coconut oil.  Avoid foods with partially hydrogenated oils in them. These contain trans fats. Examples of foods that have trans fats are stick margarine, some tub margarines, cookies, crackers, and other baked goods. What general guidelines do I need to follow?  Check food labels. Look for the words "trans fat" and "saturated fat."  When preparing a meal: ? Fill half of your plate with vegetables and green salads. ? Fill one fourth of your plate with whole grains. Look for the word "whole" as the first word in the ingredient list. ? Fill one fourth of your plate with lean protein foods.  Eat more foods that have fiber, like apples, carrots, beans, peas, and barley.  Eat more home-cooked foods. Eat less at restaurants and buffets.  Limit or avoid alcohol.  Limit foods high in starch and sugar.  Limit fried foods.  Cook foods without frying  them. Baking, boiling, grilling, and broiling are all great options.  Lose weight if you are overweight. Losing even a small amount of weight can help your overall health. It can also help prevent diseases such as diabetes and heart disease. What foods can I eat? Grains Whole grains, such as whole wheat or whole grain breads, crackers, cereals, and pasta. Unsweetened oatmeal, bulgur, barley, quinoa, or brown rice. Corn or whole wheat flour tortillas. Vegetables Fresh or frozen vegetables (raw, steamed, roasted, or grilled). Green salads. Fruits All fresh, canned (in natural juice), or frozen fruits. Meat and Other Protein Products Ground beef (85% or leaner), grass-fed beef, or beef trimmed of fat. Skinless chicken or [place]. Ground chicken or [place]. Pork trimmed of fat. All fish and seafood. Eggs. Dried beans, peas, or lentils. Unsalted nuts or seeds. Unsalted canned or dry beans. Dairy Low-fat dairy products, such as skim or 1% milk, 2% or reduced-fat cheeses, low-fat ricotta or cottage cheese, or plain low-fat yogurt. Fats and Oils Tub margarines without trans fats. Light or reduced-fat mayonnaise and salad dressings. Avocado. Olive, canola, sesame, or safflower oils. Natural peanut or almond butter (choose ones without added sugar and oil). The items listed above may not be a complete list of recommended foods or beverages. Contact your dietitian for more options. What foods are not recommended? Grains White bread. White pasta. White rice. Cornbread. Bagels, pastries, and croissants. Crackers that contain trans fat. Vegetables White potatoes. Corn. Creamed or fried vegetables. Vegetables in a cheese sauce. Fruits Dried fruits. Canned  fruit in light or heavy syrup. Fruit juice. Meat and Other Protein Products Fatty cuts of meat. Ribs, chicken wings, bacon, sausage, bologna, salami, chitterlings, fatback, hot dogs, bratwurst, and packaged luncheon meats. Liver and organ  meats. Dairy Whole or 2% milk, cream, half-and-half, and cream cheese. Whole milk cheeses. Whole-fat or sweetened yogurt. Full-fat cheeses. Nondairy creamers and whipped toppings. Processed cheese, cheese spreads, or cheese curds. Sweets and Desserts Corn syrup, sugars, honey, and molasses. Candy. Jam and jelly. Syrup. Sweetened cereals. Cookies, pies, cakes, donuts, muffins, and ice cream. Fats and Oils Butter, stick margarine, lard, shortening, ghee, or bacon fat. Coconut, palm kernel, or palm oils. Beverages Alcohol. Sweetened drinks (such as sodas, lemonade, and fruit drinks or punches). The items listed above may not be a complete list of foods and beverages to avoid. Contact your dietitian for more information. This information is not intended to replace advice given to you by your health care provider. Make sure you discuss any questions you have with your health care provider. Document Released: 03/25/2012 Document Revised: 05/31/2016 Document Reviewed: 12/24/2013 Elsevier Interactive Patient Education  [employer].

## 2017-06-24 NOTE — Progress Notes (Signed)
Patient presents to clinic today for follow-up of chronic medical issues.  Hypertension -- Patient endorses taking amlodipine-benazepril 10-20 mg daily as directed. Patient denies chest pain, palpitations, lightheadedness, dizziness, vision changes or frequent headaches.  BP Readings from Last 3 Encounters:  06/24/17 138/86  12/25/16 138/80  12/04/16 124/80   Hyperlipidemia -- Is currently on Atorvastatin 10 mg daily. Endorses taking as directed. Has noted increase in joint aches since starting medication. Is wondering if these are linked. Is trying to stay active. Is not following a low-cholesterol diet as previously directed.  GERD -- Is taking Protonix daily as directed. Is taking as directed with substantial relief in symptoms.   Past Medical History:  Diagnosis Date  . Depression   . GERD (gastroesophageal reflux disease)   . Hx of adenomatous colonic polyps 09/04/2015  . Hyperlipidemia   . Hypertension   . LBP (low back pain)     Current Outpatient Prescriptions on File Prior to Visit  Medication Sig Dispense Refill  . amLODipine-benazepril (LOTREL) 10-20 MG capsule TAKE ONE CAPSULE BY MOUTH ONCE DAILY 90 capsule 0  . atorvastatin (LIPITOR) 10 MG tablet TAKE ONE TABLET BY MOUTH ONCE DAILY 90 tablet 0  . naproxen sodium (ANAPROX) 220 MG tablet Take 220 mg by mouth 2 (two) times daily with a meal.     . pantoprazole (PROTONIX) 40 MG tablet TAKE ONE TABLET BY MOUTH ONCE DAILY 90 tablet 0  . aspirin 81 MG tablet Take 81 mg by mouth daily.     No current facility-administered medications on file prior to visit.     Allergies  Allergen Reactions  . Enalapril Maleate     REACTION: cramps  . Hctz [Hydrochlorothiazide]     SOB  . Tribenzor [Olmesartan-Amlodipine-Hctz] Swelling    Fatigue, CP, edema  . Verapamil     REACTION: Fatigue    Family History  Problem Relation Age of Onset  . Breast cancer Daughter   . Hypertension Other   . Pancreatic cancer Brother   .  Throat cancer Brother   . Colon cancer Neg Hx   . Esophageal cancer Neg Hx   . Stomach cancer Neg Hx   . Rectal cancer Neg Hx     Social History   Social History  . Marital status: Divorced    Spouse name: N/A  . Number of children: N/A  . Years of education: N/A   Social History Main Topics  . Smoking status: Never Smoker  . Smokeless tobacco: Never Used  . Alcohol use 2.4 oz/week    4 Cans of beer per week  . Drug use: No  . Sexual activity: Not Currently   Other Topics Concern  . Not on file   Social History Narrative   Regular exercise- yes, seldom; working physically all the time.   Review of Systems - See HPI.  All other ROS are negative.  BP 138/86   Pulse 83   Temp 98.1 F (36.7 C) (Oral)   Resp 14   Ht 6\' 4"  (1.93 m)   Wt (!) 302 lb (137 kg)   SpO2 92%   BMI 36.76 kg/m   Physical Exam  Constitutional: He is oriented to person, place, and time and well-developed, well-nourished, and in no distress.  HENT:  Head: Normocephalic and atraumatic.  Right Ear: Tympanic membrane normal.  Left Ear: Tympanic membrane normal.  Nose: Nose normal.  Eyes: Conjunctivae are normal.  Neck: Neck supple.  Cardiovascular: Normal rate, regular rhythm, normal  heart sounds and intact distal pulses.   Pulmonary/Chest: Effort normal and breath sounds normal. No respiratory distress. He has no wheezes. He has no rales. He exhibits no tenderness.  Neurological: He is alert and oriented to person, place, and time.  Skin: Skin is warm and dry. No rash noted.  Psychiatric: Affect normal.  Vitals reviewed.  Assessment/Plan: Essential hypertension BP stable. Asymptomatic. Repeat labs today. Continue current regimen.  GERD (gastroesophageal reflux disease) Doing very well presently. Will continue current regimen. Continue GERD diet.   Hyperlipidemia Is currently on Lipitor. Noting arthralgias. Will recheck lipids and LFT today. Will hold statin x 1 week to see if there is  improvement. Will make further changes according to response.     [name], PA-C

## 2017-10-04 ENCOUNTER — Other Ambulatory Visit: Payer: Self-pay | Admitting: Physician Assistant

## 2018-02-12 ENCOUNTER — Ambulatory Visit: Payer: Medicare HMO | Admitting: Physician Assistant

## 2018-02-18 ENCOUNTER — Other Ambulatory Visit: Payer: Self-pay | Admitting: Physician Assistant

## 2018-02-18 DIAGNOSIS — Z6836 Body mass index (BMI) 36.0-36.9, adult: Secondary | ICD-10-CM | POA: Diagnosis not present

## 2018-02-18 DIAGNOSIS — M4726 Other spondylosis with radiculopathy, lumbar region: Secondary | ICD-10-CM | POA: Diagnosis not present

## 2018-03-04 DIAGNOSIS — M545 Low back pain: Secondary | ICD-10-CM | POA: Diagnosis not present

## 2018-03-04 DIAGNOSIS — M4726 Other spondylosis with radiculopathy, lumbar region: Secondary | ICD-10-CM | POA: Diagnosis not present

## 2018-03-06 ENCOUNTER — Encounter: Payer: Self-pay | Admitting: Physician Assistant

## 2018-03-06 ENCOUNTER — Other Ambulatory Visit: Payer: Self-pay

## 2018-03-06 ENCOUNTER — Ambulatory Visit (INDEPENDENT_AMBULATORY_CARE_PROVIDER_SITE_OTHER): Payer: Medicare HMO | Admitting: Physician Assistant

## 2018-03-06 ENCOUNTER — Ambulatory Visit (INDEPENDENT_AMBULATORY_CARE_PROVIDER_SITE_OTHER): Payer: Medicare HMO

## 2018-03-06 VITALS — BP 138/90 | HR 84 | Temp 97.9°F | Resp 18 | Ht 76.0 in | Wt 301.2 lb

## 2018-03-06 VITALS — BP 138/90 | HR 84 | Temp 97.9°F | Resp 18 | Ht 76.0 in | Wt 301.0 lb

## 2018-03-06 DIAGNOSIS — I10 Essential (primary) hypertension: Secondary | ICD-10-CM

## 2018-03-06 DIAGNOSIS — E785 Hyperlipidemia, unspecified: Secondary | ICD-10-CM | POA: Diagnosis not present

## 2018-03-06 DIAGNOSIS — Z125 Encounter for screening for malignant neoplasm of prostate: Secondary | ICD-10-CM

## 2018-03-06 DIAGNOSIS — Z23 Encounter for immunization: Secondary | ICD-10-CM | POA: Diagnosis not present

## 2018-03-06 DIAGNOSIS — Z0000 Encounter for general adult medical examination without abnormal findings: Secondary | ICD-10-CM

## 2018-03-06 LAB — CBC WITH DIFFERENTIAL/PLATELET
BASOS PCT: 0.7 % (ref 0.0–3.0)
Basophils Absolute: 0 10*3/uL (ref 0.0–0.1)
EOS PCT: 1.4 % (ref 0.0–5.0)
Eosinophils Absolute: 0.1 10*3/uL (ref 0.0–0.7)
HCT: 46.6 % (ref 39.0–52.0)
HEMOGLOBIN: 16 g/dL (ref 13.0–17.0)
Lymphocytes Relative: 30.7 % (ref 12.0–46.0)
Lymphs Abs: 1.9 10*3/uL (ref 0.7–4.0)
MCHC: 34.4 g/dL (ref 30.0–36.0)
MCV: 98.2 fl (ref 78.0–100.0)
MONOS PCT: 10.7 % (ref 3.0–12.0)
Monocytes Absolute: 0.6 10*3/uL (ref 0.1–1.0)
Neutro Abs: 3.4 10*3/uL (ref 1.4–7.7)
Neutrophils Relative %: 56.5 % (ref 43.0–77.0)
Platelets: 226 10*3/uL (ref 150.0–400.0)
RBC: 4.74 Mil/uL (ref 4.22–5.81)
RDW: 13.7 % (ref 11.5–15.5)
WBC: 6 10*3/uL (ref 4.0–10.5)

## 2018-03-06 LAB — COMPREHENSIVE METABOLIC PANEL
ALBUMIN: 4.3 g/dL (ref 3.5–5.2)
ALK PHOS: 57 U/L (ref 39–117)
ALT: 45 U/L (ref 0–53)
AST: 26 U/L (ref 0–37)
BUN: 11 mg/dL (ref 6–23)
CALCIUM: 9.5 mg/dL (ref 8.4–10.5)
CO2: 28 mEq/L (ref 19–32)
Chloride: 102 mEq/L (ref 96–112)
Creatinine, Ser: 0.89 mg/dL (ref 0.40–1.50)
GFR: 89.92 mL/min (ref >60.00)
Glucose, Bld: 126 mg/dL — ABNORMAL HIGH (ref 70–99)
POTASSIUM: 4.1 meq/L (ref 3.5–5.1)
Sodium: 138 mEq/L (ref 135–145)
TOTAL PROTEIN: 7.4 g/dL (ref 6.0–8.3)
Total Bilirubin: 0.8 mg/dL (ref 0.2–1.2)

## 2018-03-06 LAB — LIPID PANEL
CHOLESTEROL: 163 mg/dL (ref 0–200)
HDL: 33.1 mg/dL — AB (ref >39.00)
LDL Cholesterol: 115 mg/dL — ABNORMAL HIGH (ref 0–99)
NonHDL: 129.79
TRIGLYCERIDES: 76 mg/dL (ref 0.0–149.0)
Total CHOL/HDL Ratio: 5
VLDL: 15.2 mg/dL (ref 0.0–40.0)

## 2018-03-06 LAB — PSA: PSA: 1.39 ng/mL (ref 0.10–4.00)

## 2018-03-06 MED ORDER — ZOSTER VAC RECOMB ADJUVANTED 50 MCG/0.5ML IM SUSR: 0.5000 mL | Freq: Once | INTRAMUSCULAR | 1 refills | Status: AC

## 2018-03-06 MED ORDER — BENAZEPRIL HCL 20 MG PO TABS
40.0000 mg | ORAL_TABLET | Freq: Every day | ORAL | 3 refills | Status: DC
Start: 2018-03-06 — End: 2018-12-15

## 2018-03-06 NOTE — Progress Notes (Signed)
Subjective:   Hector Edwards is a 72 y.o. male who presents for Medicare Annual/Subsequent preventive examination.  Review of Systems:  No ROS.  Medicare Wellness Visit. Additional risk factors are reflected in the social history.  Cardiac Risk Factors include: advanced age (>61men, >42 women);dyslipidemia;hypertension;male gender;sedentary lifestyle;obesity (BMI >30kg/m2)   Sleep patterns: Sleeps 10 hours. Up to void x 2.  Home Safety/Smoke Alarms: Feels safe in home. Smoke alarms in place.  Living environment; residence and Firearm Safety: Lives alone in 2 story home, uses rail.  Seat Belt Safety/Bike Helmet: Wears seat belt.    Male:   CCS-Colonoscopy 08/29/2015, polyp. Recall 3 years. Dr. Carlean Purl.      PSA-  Lab Results  Component Value Date   PSA 1.42 11/13/2016   PSA 1.40 08/02/2015   PSA 1.25 05/04/2013       Objective:    Vitals: BP 138/90 (BP Location: Left Arm, Patient Position: Sitting, Cuff Size: Large)   Pulse 84   Temp 97.9 F (36.6 C) (Temporal)   Resp 18   Ht 6\' 4"  (1.93 m)   Wt (!) 301 lb 4 oz (136.6 kg)   SpO2 95%   BMI 36.67 kg/m   Body mass index is 36.67 kg/m.  Advanced Directives 03/06/2018 11/13/2016 08/29/2015 08/15/2015 06/20/2015  Does Patient Have a Medical Advance Directive? No No Yes Yes -  Type of Advance Directive - - Healthcare Power of [place];Living will -  Would patient like information on creating a medical advance directive? No - Patient declined - - - No - patient declined information    Tobacco Social History   Tobacco Use  Smoking Status Never Smoker  Smokeless Tobacco Never Used     Counseling given: Not Answered    Past Medical History:  Diagnosis Date  . Depression   . GERD (gastroesophageal reflux disease)   . Hx of adenomatous colonic polyps 09/04/2015  . Hyperlipidemia   . Hypertension   . LBP (low back pain)    Past Surgical History:  Procedure Laterality Date  .  COLONOSCOPY  2004   hyperplastic polyps x 2  . KNEE ARTHROSCOPY Right   . SHOULDER ARTHROSCOPY W/ ROTATOR CUFF REPAIR    . TONSILLECTOMY    . vocal cord growth removed     Family History  Problem Relation Age of Onset  . Breast cancer Daughter   . Hypertension Other   . Pancreatic cancer Brother   . Throat cancer Brother   . Colon cancer Neg Hx   . Esophageal cancer Neg Hx   . Stomach cancer Neg Hx   . Rectal cancer Neg Hx    Social History   Socioeconomic History  . Marital status: Divorced    Spouse name: Not on file  . Number of children: Not on file  . Years of education: Not on file  . Highest education level: Not on file  Occupational History  . Not on file  Social Needs  . Financial resource strain: Not on file  . Food insecurity:    Worry: Not on file    Inability: Not on file  . Transportation needs:    Medical: Not on file    Non-medical: Not on file  Tobacco Use  . Smoking status: Never Smoker  . Smokeless tobacco: Never Used  Substance and Sexual Activity  . Alcohol use: Yes    Alcohol/week: 2.4 oz    Types: 4 Cans of beer per week  Comment: 4 cans/week  . Drug use: No  . Sexual activity: Not Currently  Lifestyle  . Physical activity:    Days per week: Not on file    Minutes per session: Not on file  . Stress: Not on file  Relationships  . Social connections:    Talks on phone: Not on file    Gets together: Not on file    Attends religious service: Not on file    Active member of club or organization: Not on file    Attends meetings of clubs or organizations: Not on file    Relationship status: Not on file  Other Topics Concern  . Not on file  Social History Narrative   Regular exercise- yes, seldom; working physically all the time.    Outpatient Encounter Medications as of 03/06/2018  Medication Sig  . amLODipine-benazepril (LOTREL) 10-20 MG capsule TAKE 1 CAPSULE BY MOUTH ONCE DAILY  . Ascorbic Acid (VITAMIN C) 1000 MG tablet Take  1,000 mg by mouth daily.  Marland Kitchen aspirin 81 MG tablet Take 81 mg by mouth daily.  . cyanocobalamin ([facility] VITAMIN B12) 1000 MCG tablet Take 1,000 mcg by mouth daily.  Marland Kitchen gabapentin (NEURONTIN) 300 MG capsule 1 capsule at bedtime.  . naproxen sodium (ANAPROX) 220 MG tablet Take 220 mg by mouth 2 (two) times daily with a meal.   . pantoprazole (PROTONIX) 40 MG tablet TAKE 1 TABLET BY MOUTH ONCE DAILY  . atorvastatin (LIPITOR) 10 MG tablet TAKE ONE TABLET BY MOUTH ONCE DAILY (Patient not taking: Reported on 03/06/2018)  . Zoster Vaccine Adjuvanted [facility]) injection Inject 0.5 mLs into the muscle once for 1 dose.   No facility-administered encounter medications on file as of 03/06/2018.     Activities of Daily Living In your present state of health, do you have any difficulty performing the following activities: 03/06/2018 06/24/2017  Hearing? Y N  Vision? N N  Difficulty concentrating or making decisions? N N  Walking or climbing stairs? N N  Dressing or bathing? N N  Doing errands, shopping? N N  Preparing Food and eating ? N -  Using the Toilet? N -  In the past six months, have you accidently leaked urine? N -  Do you have problems with loss of bowel control? N -  Managing your Medications? N -  Managing your Finances? N -  Housekeeping or managing your Housekeeping? N -  Some recent data might be hidden    Patient Care Team: Delorse Limber as PCP - General (Physician Assistant) Gatha Mayer, MD as Consulting Physician (Gastroenterology) Druscilla Brownie, MD as Consulting Physician (Dermatology) Jettie Booze, MD as Consulting Physician (Cardiology)   Assessment:   This is a routine wellness examination for Hector Edwards.  Exercise Activities and Dietary recommendations Current Exercise Habits: The patient does not participate in regular exercise at present(Maintains house and yard; remodeling home), Exercise limited by: None identified   Diet (meal preparation, eat out,  water intake, caffeinated beverages, dairy products, fruits and vegetables): Drinks soda and water.   Breakfast: eggs/bacon/toast/coffee Lunch: Eats out, burger Dinner: Meat and vegetables    Goals    . Increase physical activity     Increase walking.      . Increase physical activity     Increase activity by walking more and caring for horse/animals.        Fall Risk Fall Risk  03/06/2018 11/13/2016 11/13/2016 06/20/2015  Falls in the past year? No No No No  Depression Screen PHQ 2/9 Scores 03/06/2018 06/24/2017 11/13/2016 11/13/2016  PHQ - 2 Score 0 0 0 0  PHQ- 9 Score - 0 0 -    Cognitive Function MMSE - Mini Mental State Exam 03/06/2018 11/13/2016 06/20/2015  Orientation to time 5 5 5   Orientation to Place 5 5 5   Registration 3 3 3   Attention/ Calculation 5 5 5   Recall 3 3 3   Language- name 2 objects 2 2 2   Language- repeat 1 1 1   Language- follow 3 step command 3 3 3   Language- read & follow direction 1 1 1   Write a sentence 1 1 1   Copy design 1 1 1   Total score 30 30 30         Immunization History  Administered Date(s) Administered  . Influenza Split 08/08/2011  . Influenza,inj,Quad PF,6+ Mos 06/20/2015, 11/13/2016, 06/24/2017  . Pneumococcal Conjugate-13 11/02/2013  . Pneumococcal Polysaccharide-23 06/20/2015  . Tdap 11/02/2013  . Zoster 10/13/2009     Screening Tests Health Maintenance  Topic Date Due  . Hepatitis C Screening  03/07/2019 (Originally 01-16-1946)  . INFLUENZA VACCINE  05/08/2018  . COLONOSCOPY  08/28/2018  . DTaP/Tdap/Td (2 - Td) 11/03/2023  . TETANUS/TDAP  11/03/2023  . PNA vac Low Risk Adult  Completed        Plan:     Shingles vaccine at pharmacy.   Continue doing brain stimulating activities (puzzles, reading, adult coloring books, staying active) to keep memory sharp.   Eat heart healthy diet (full of fruits, vegetables, whole grains, lean protein, water--limit salt, fat, and sugar intake) and increase physical activity as  tolerated.  I have personally reviewed and noted the following in the patient's chart:   . Medical and social history . Use of alcohol, tobacco or illicit drugs  . Current medications and supplements . Functional ability and status . Nutritional status . Physical activity . Advanced directives . List of other physicians . Hospitalizations, surgeries, and ER visits in previous 12 months . Vitals . Screenings to include cognitive, depression, and falls . Referrals and appointments  In addition, I have reviewed and discussed with patient certain preventive protocols, quality metrics, and best practice recommendations. A written personalized care plan for preventive services as well as general preventive health recommendations were provided to patient.     Gerilyn Nestle, RN  03/06/2018

## 2018-03-06 NOTE — Progress Notes (Signed)
RN AWV note reviewed.  [name], PA-C

## 2018-03-06 NOTE — Progress Notes (Signed)
Patient presents to clinic today for follow-up of chronic medical conditions after his AWV with our RN this morning.   Hypertension -- Has been well-controlled for some time. Patient denies chest pain, palpitations, lightheadedness, dizziness, vision changes or frequent headaches. Notes erectile dysfunction once being placed on this medication. Has tried Viagra with only some improvement in symptoms. Wants to discuss potential medication change.   Hyperlipidemia -- Currently on statin medication. Is not watching diet but is staying very active.   Depression -- Patient endorses doing very well off of medication. Denies any current depressed mood/anhedonia. Denies SI/HI.  Past Medical History:  Diagnosis Date  . Depression   . GERD (gastroesophageal reflux disease)   . Hx of adenomatous colonic polyps 09/04/2015  . Hyperlipidemia   . Hypertension   . LBP (low back pain)     Current Outpatient Medications on File Prior to Visit  Medication Sig Dispense Refill  . amLODipine-benazepril (LOTREL) 10-20 MG capsule TAKE 1 CAPSULE BY MOUTH ONCE DAILY 90 capsule 0  . aspirin 81 MG tablet Take 81 mg by mouth daily.    . naproxen sodium (ANAPROX) 220 MG tablet Take 220 mg by mouth 2 (two) times daily with a meal.     . pantoprazole (PROTONIX) 40 MG tablet TAKE 1 TABLET BY MOUTH ONCE DAILY 90 tablet 0  . atorvastatin (LIPITOR) 10 MG tablet TAKE ONE TABLET BY MOUTH ONCE DAILY (Patient not taking: Reported on 03/06/2018) 90 tablet 0   No current facility-administered medications on file prior to visit.     Allergies  Allergen Reactions  . Enalapril Maleate     REACTION: cramps  . Hctz [Hydrochlorothiazide]     SOB  . Tribenzor [Olmesartan-Amlodipine-Hctz] Swelling    Fatigue, CP, edema  . Verapamil     REACTION: Fatigue    Family History  Problem Relation Age of Onset  . Breast cancer Daughter   . Hypertension Other   . Pancreatic cancer Brother   . Throat cancer Brother   . Colon  cancer Neg Hx   . Esophageal cancer Neg Hx   . Stomach cancer Neg Hx   . Rectal cancer Neg Hx     Social History   Socioeconomic History  . Marital status: Divorced    Spouse name: Not on file  . Number of children: Not on file  . Years of education: Not on file  . Highest education level: Not on file  Occupational History  . Not on file  Social Needs  . Financial resource strain: Not on file  . Food insecurity:    Worry: Not on file    Inability: Not on file  . Transportation needs:    Medical: Not on file    Non-medical: Not on file  Tobacco Use  . Smoking status: Never Smoker  . Smokeless tobacco: Never Used  Substance and Sexual Activity  . Alcohol use: Yes    Alcohol/week: 2.4 oz    Types: 4 Cans of beer per week    Comment: 4 cans/week  . Drug use: No  . Sexual activity: Not Currently  Lifestyle  . Physical activity:    Days per week: Not on file    Minutes per session: Not on file  . Stress: Not on file  Relationships  . Social connections:    Talks on phone: Not on file    Gets together: Not on file    Attends religious service: Not on file    Active member  of club or organization: Not on file    Attends meetings of clubs or organizations: Not on file    Relationship status: Not on file  Other Topics Concern  . Not on file  Social History Narrative   Regular exercise- yes, seldom; working physically all the time.   Review of Systems - See HPI.  All other ROS are negative.  BP 138/90   Pulse 84   Temp 97.9 F (36.6 C) (Oral)   Resp 18   Ht 6\' 4"  (1.93 m)   Wt (!) 301 lb (136.5 kg)   SpO2 95%   BMI 36.64 kg/m   Physical Exam  Constitutional: He is oriented to person, place, and time. He appears well-developed and well-nourished.  HENT:  Head: Normocephalic and atraumatic.  Right Ear: Tympanic membrane and external ear normal.  Left Ear: Tympanic membrane and external ear normal.  Nose: Nose normal.  Mouth/Throat: Oropharynx is clear and  moist.  Eyes: Pupils are equal, round, and reactive to light. Conjunctivae and EOM are normal.  Neck: Neck supple. No thyromegaly present.  Cardiovascular: Normal rate, regular rhythm, normal heart sounds and intact distal pulses.  Pulmonary/Chest: Effort normal and breath sounds normal.  Lymphadenopathy:    He has no cervical adenopathy.  Neurological: He is alert and oriented to person, place, and time. No cranial nerve deficit.  Vitals reviewed.  Assessment/Plan: 1. Hyperlipidemia, unspecified hyperlipidemia type Fasting lipids and CMP today. Will alter regimen accordingly.  - Comprehensive metabolic panel - Lipid panel  2. Essential hypertension Giving his concern of side effect will alter regimen. Suspect if one of the medications is contributing, it would be the CCB. Will stop combo medication and start Benazepril 40 mg daily. Patient to check BP at home and record. Follow-up 2 weeks for reassessment.  - CBC with Differential/Platelet - Comprehensive metabolic panel  3. Prostate cancer screening The natural history of prostate cancer and ongoing controversy regarding screening and potential treatment outcomes of prostate cancer has been discussed with the patient. The meaning of a false positive PSA and a false negative PSA has been discussed. He indicates understanding of the limitations of this screening test and wishes to proceed with screening PSA testing.  - PSA   [name], PA-C

## 2018-03-06 NOTE — Patient Instructions (Signed)
Please go to the lab today for blood work.  I will call you with your results. We will alter treatment regimen(s) if indicated by your results.   Please stop the amlodipine-benazepril. Start the Benazepril as directed. Keep a low salt diet. Check BP daily and record. Follow-up with me in 2 weeks.

## 2018-03-06 NOTE — Patient Instructions (Addendum)
Shingles vaccine at pharmacy.   Continue doing brain stimulating activities (puzzles, reading, adult coloring books, staying active) to keep memory sharp.   Eat heart healthy diet (full of fruits, vegetables, whole grains, lean protein, water--limit salt, fat, and sugar intake) and increase physical activity as tolerated.   Health Maintenance, Male A healthy lifestyle and preventive care is important for your health and wellness. Ask your health care provider about what schedule of regular examinations is right for you. What should I know about weight and diet? Eat a Healthy Diet  Eat plenty of vegetables, fruits, whole grains, low-fat dairy products, and lean protein.  Do not eat a lot of foods high in solid fats, added sugars, or salt.  Maintain a Healthy Weight Regular exercise can help you achieve or maintain a healthy weight. You should:  Do at least 150 minutes of exercise each week. The exercise should increase your heart rate and make you sweat (moderate-intensity exercise).  Do strength-training exercises at least twice a week.  Watch Your Levels of Cholesterol and Blood Lipids  Have your blood tested for lipids and cholesterol every 5 years starting at 69 years of age. If you are at high risk for heart disease, you should start having your blood tested when you are 70 years old. You may need to have your cholesterol levels checked more often if: ? Your lipid or cholesterol levels are high. ? You are older than 70 years of age. ? You are at high risk for heart disease.  What should I know about cancer screening? Many types of cancers can be detected early and may often be prevented. Lung Cancer  You should be screened every year for lung cancer if: ? You are a current smoker who has smoked for at least 30 years. ? You are a former smoker who has quit within the past 15 years.  Talk to your health care provider about your screening options, when you should start  screening, and how often you should be screened.  Colorectal Cancer  Routine colorectal cancer screening usually begins at 70 years of age and should be repeated every 5-10 years until you are 70 years old. You may need to be screened more often if early forms of precancerous polyps or small growths are found. Your health care provider may recommend screening at an earlier age if you have risk factors for colon cancer.  Your health care provider may recommend using home test kits to check for hidden blood in the stool.  A small camera at the end of a tube can be used to examine your colon (sigmoidoscopy or colonoscopy). This checks for the earliest forms of colorectal cancer.  Prostate and Testicular Cancer  Depending on your age and overall health, your health care provider may do certain tests to screen for prostate and testicular cancer.  Talk to your health care provider about any symptoms or concerns you have about testicular or prostate cancer.  Skin Cancer  Check your skin from head to toe regularly.  Tell your health care provider about any new moles or changes in moles, especially if: ? There is a change in a mole's size, shape, or color. ? You have a mole that is larger than a pencil eraser.  Always use sunscreen. Apply sunscreen liberally and repeat throughout the day.  Protect yourself by wearing long sleeves, pants, a wide-brimmed hat, and sunglasses when outside.  What should I know about heart disease, diabetes, and high blood pressure?  If you are 50-76 years of age, have your blood pressure checked every 3-5 years. If you are 11 years of age or older, have your blood pressure checked every year. You should have your blood pressure measured twice-once when you are at a hospital or clinic, and once when you are not at a hospital or clinic. Record the average of the two measurements. To check your blood pressure when you are not at a hospital or clinic, you can use: ? An  automated blood pressure machine at a pharmacy. ? A home blood pressure monitor.  Talk to your health care provider about your target blood pressure.  If you are between 11-78 years old, ask your health care provider if you should take aspirin to prevent heart disease.  Have regular diabetes screenings by checking your fasting blood sugar level. ? If you are at a normal weight and have a low risk for diabetes, have this test once every three years after the age of 85. ? If you are overweight and have a high risk for diabetes, consider being tested at a younger age or more often.  A one-time screening for abdominal aortic aneurysm (AAA) by ultrasound is recommended for men aged 71-75 years who are current or former smokers. What should I know about preventing infection? Hepatitis B If you have a higher risk for hepatitis B, you should be screened for this virus. Talk with your health care provider to find out if you are at risk for hepatitis B infection. Hepatitis C Blood testing is recommended for:  Everyone born from [birth year] through 1963.  Anyone with known risk factors for hepatitis C.  Sexually Transmitted Diseases (STDs)  You should be screened each year for STDs including gonorrhea and chlamydia if: ? You are sexually active and are younger than 70 years of age. ? You are older than 70 years of age and your health care provider tells you that you are at risk for this type of infection. ? Your sexual activity has changed since you were last screened and you are at an increased risk for chlamydia or gonorrhea. Ask your health care provider if you are at risk.  Talk with your health care provider about whether you are at high risk of being infected with HIV. Your health care provider may recommend a prescription medicine to help prevent HIV infection.  What else can I do?  Schedule regular health, dental, and eye exams.  Stay current with your vaccines (immunizations).  Do not use  any tobacco products, such as cigarettes, chewing tobacco, and e-cigarettes. If you need help quitting, ask your health care provider.  Limit alcohol intake to no more than 2 drinks per day. One drink equals 12 ounces of beer, 5 ounces of wine, or 1 ounces of hard liquor.  Do not use street drugs.  Do not share needles.  Ask your health care provider for help if you need support or information about quitting drugs.  Tell your health care provider if you often feel depressed.  Tell your health care provider if you have ever been abused or do not feel safe at home. This information is not intended to replace advice given to you by your health care provider. Make sure you discuss any questions you have with your health care provider. Document Released: 03/22/2008 Document Revised: 05/23/2016 Document Reviewed: 06/28/2015 Elsevier Interactive Patient Education  [employer].

## 2018-03-07 ENCOUNTER — Other Ambulatory Visit (INDEPENDENT_AMBULATORY_CARE_PROVIDER_SITE_OTHER): Payer: Medicare HMO

## 2018-03-07 ENCOUNTER — Other Ambulatory Visit: Payer: Self-pay | Admitting: Emergency Medicine

## 2018-03-07 DIAGNOSIS — M4726 Other spondylosis with radiculopathy, lumbar region: Secondary | ICD-10-CM | POA: Diagnosis not present

## 2018-03-07 DIAGNOSIS — E785 Hyperlipidemia, unspecified: Secondary | ICD-10-CM

## 2018-03-07 DIAGNOSIS — R7309 Other abnormal glucose: Secondary | ICD-10-CM | POA: Diagnosis not present

## 2018-03-07 LAB — HEMOGLOBIN A1C: HEMOGLOBIN A1C: 5.6 % (ref 4.6–6.5)

## 2018-03-07 MED ORDER — PRAVASTATIN SODIUM 20 MG PO TABS: 20.0000 mg | ORAL_TABLET | Freq: Every day | ORAL | 1 refills | Status: DC

## 2018-04-04 DIAGNOSIS — Z6836 Body mass index (BMI) 36.0-36.9, adult: Secondary | ICD-10-CM | POA: Diagnosis not present

## 2018-04-04 DIAGNOSIS — M5136 Other intervertebral disc degeneration, lumbar region: Secondary | ICD-10-CM | POA: Diagnosis not present

## 2018-04-04 DIAGNOSIS — M47816 Spondylosis without myelopathy or radiculopathy, lumbar region: Secondary | ICD-10-CM | POA: Diagnosis not present

## 2018-04-15 DIAGNOSIS — M47816 Spondylosis without myelopathy or radiculopathy, lumbar region: Secondary | ICD-10-CM | POA: Diagnosis not present

## 2018-05-02 DIAGNOSIS — Z803 Family history of malignant neoplasm of breast: Secondary | ICD-10-CM | POA: Diagnosis not present

## 2018-05-02 DIAGNOSIS — K219 Gastro-esophageal reflux disease without esophagitis: Secondary | ICD-10-CM | POA: Diagnosis not present

## 2018-05-02 DIAGNOSIS — Z8249 Family history of ischemic heart disease and other diseases of the circulatory system: Secondary | ICD-10-CM | POA: Diagnosis not present

## 2018-05-02 DIAGNOSIS — Z809 Family history of malignant neoplasm, unspecified: Secondary | ICD-10-CM | POA: Diagnosis not present

## 2018-05-02 DIAGNOSIS — G8929 Other chronic pain: Secondary | ICD-10-CM | POA: Diagnosis not present

## 2018-05-02 DIAGNOSIS — I10 Essential (primary) hypertension: Secondary | ICD-10-CM | POA: Diagnosis not present

## 2018-06-02 ENCOUNTER — Other Ambulatory Visit: Payer: Self-pay

## 2018-06-02 ENCOUNTER — Ambulatory Visit (INDEPENDENT_AMBULATORY_CARE_PROVIDER_SITE_OTHER): Payer: Medicare HMO | Admitting: Physician Assistant

## 2018-06-02 ENCOUNTER — Encounter: Payer: Self-pay | Admitting: Physician Assistant

## 2018-06-02 VITALS — BP 138/78 | HR 94 | Temp 97.6°F | Resp 16 | Ht 76.0 in | Wt 307.6 lb

## 2018-06-02 DIAGNOSIS — R5382 Chronic fatigue, unspecified: Secondary | ICD-10-CM | POA: Diagnosis not present

## 2018-06-02 LAB — CBC WITH DIFFERENTIAL/PLATELET
Basophils Absolute: 0 10*3/uL (ref 0.0–0.1)
Basophils Relative: 0.3 % (ref 0.0–3.0)
Eosinophils Absolute: 0.1 10*3/uL (ref 0.0–0.7)
Eosinophils Relative: 1.2 % (ref 0.0–5.0)
HCT: 44.8 % (ref 39.0–52.0)
Hemoglobin: 15.3 g/dL (ref 13.0–17.0)
LYMPHS ABS: 2.3 10*3/uL (ref 0.7–4.0)
Lymphocytes Relative: 28.6 % (ref 12.0–46.0)
MCHC: 34.2 g/dL (ref 30.0–36.0)
MCV: 98.6 fl (ref 78.0–100.0)
MONOS PCT: 12.4 % — AB (ref 3.0–12.0)
Monocytes Absolute: 1 10*3/uL (ref 0.1–1.0)
NEUTROS ABS: 4.6 10*3/uL (ref 1.4–7.7)
NEUTROS PCT: 57.5 % (ref 43.0–77.0)
PLATELETS: 231 10*3/uL (ref 150.0–400.0)
RBC: 4.54 Mil/uL (ref 4.22–5.81)
RDW: 13.8 % (ref 11.5–15.5)
WBC: 8 10*3/uL (ref 4.0–10.5)

## 2018-06-02 LAB — COMPREHENSIVE METABOLIC PANEL
ALT: 52 U/L (ref 0–53)
AST: 32 U/L (ref 0–37)
Albumin: 4.5 g/dL (ref 3.5–5.2)
Alkaline Phosphatase: 54 U/L (ref 39–117)
BILIRUBIN TOTAL: 0.6 mg/dL (ref 0.2–1.2)
BUN: 11 mg/dL (ref 6–23)
CALCIUM: 10.1 mg/dL (ref 8.4–10.5)
CHLORIDE: 104 meq/L (ref 96–112)
CO2: 29 meq/L (ref 19–32)
Creatinine, Ser: 0.98 mg/dL (ref 0.40–1.50)
GFR: 80.4 mL/min (ref >60.00)
Glucose, Bld: 80 mg/dL (ref 70–99)
Potassium: 4.2 mEq/L (ref 3.5–5.1)
Sodium: 141 mEq/L (ref 135–145)
Total Protein: 7.3 g/dL (ref 6.0–8.3)

## 2018-06-02 LAB — VITAMIN B12: Vitamin B-12: 618 pg/mL (ref 211–911)

## 2018-06-02 LAB — VITAMIN D 25 HYDROXY (VIT D DEFICIENCY, FRACTURES): VITD: 16.5 ng/mL — AB (ref 30.00–100.00)

## 2018-06-02 LAB — TSH: TSH: 2.34 u[IU]/mL (ref 0.35–4.50)

## 2018-06-02 NOTE — Patient Instructions (Signed)
Please go to the lab today for blood work.  I will call you with your results. We will alter treatment regimen(s) if indicated by your results.    Make sure to work on a well-balanced diet -- adding in more vegetables and lean proteins (chicken, fish). Limit sodas and red meats.

## 2018-06-02 NOTE — Progress Notes (Signed)
Patient presents to clinic today c/o daily fatigue over the past several months. Notes sleeping 8+ hours of night but still feeling tired the next day. Has also noted weight gain. Eats mostly meat nad potatoes. Some sodas and beer but mostly water. Does keep smaller portions however. Is very active. Denies SOB, chest pain, palpitations, dizziness. Denies change to stress or anxiety levels. Is unsure of snoring or apneic episodes as he notes he lives alone.  Past Medical History:  Diagnosis Date  . Depression   . GERD (gastroesophageal reflux disease)   . Hx of adenomatous colonic polyps 09/04/2015  . Hyperlipidemia   . Hypertension   . LBP (low back pain)     Current Outpatient Medications on File Prior to Visit  Medication Sig Dispense Refill  . Ascorbic Acid (VITAMIN C) 1000 MG tablet Take 1,000 mg by mouth daily.    Marland Kitchen aspirin 81 MG tablet Take 81 mg by mouth daily.    . benazepril (LOTENSIN) 20 MG tablet Take 2 tablets (40 mg total) by mouth daily. 60 tablet 3  . cyanocobalamin (CVS VITAMIN B12) 1000 MCG tablet Take 1,000 mcg by mouth daily.    . naproxen sodium (ANAPROX) 220 MG tablet Take 220 mg by mouth 2 (two) times daily with a meal.     . pantoprazole (PROTONIX) 40 MG tablet TAKE 1 TABLET BY MOUTH ONCE DAILY 90 tablet 0  . pravastatin (PRAVACHOL) 20 MG tablet Take 1 tablet (20 mg total) by mouth daily. 30 tablet 1  . gabapentin (NEURONTIN) 300 MG capsule 1 capsule at bedtime.  0   No current facility-administered medications on file prior to visit.     Allergies  Allergen Reactions  . Enalapril Maleate     REACTION: cramps  . Hctz [Hydrochlorothiazide]     SOB  . Tribenzor [Olmesartan-Amlodipine-Hctz] Swelling    Fatigue, CP, edema  . Verapamil     REACTION: Fatigue    Family History  Problem Relation Age of Onset  . Breast cancer Daughter   . Hypertension Other   . Pancreatic cancer Brother   . Throat cancer Brother   . Colon cancer Neg Hx   . Esophageal  cancer Neg Hx   . Stomach cancer Neg Hx   . Rectal cancer Neg Hx     Social History   Socioeconomic History  . Marital status: Divorced    Spouse name: Not on file  . Number of children: Not on file  . Years of education: Not on file  . Highest education level: Not on file  Occupational History  . Not on file  Social Needs  . Financial resource strain: Not on file  . Food insecurity:    Worry: Not on file    Inability: Not on file  . Transportation needs:    Medical: Not on file    Non-medical: Not on file  Tobacco Use  . Smoking status: Never Smoker  . Smokeless tobacco: Never Used  Substance and Sexual Activity  . Alcohol use: Yes    Alcohol/week: 4.0 standard drinks    Types: 4 Cans of beer per week    Comment: 4 cans/week  . Drug use: No  . Sexual activity: Not Currently  Lifestyle  . Physical activity:    Days per week: Not on file    Minutes per session: Not on file  . Stress: Not on file  Relationships  . Social connections:    Talks on phone: Not on file  Gets together: Not on file    Attends religious service: Not on file    Active member of club or organization: Not on file    Attends meetings of clubs or organizations: Not on file    Relationship status: Not on file  Other Topics Concern  . Not on file  Social History Narrative   Regular exercise- yes, seldom; working physically all the time.    Review of Systems - See HPI.  All other ROS are negative.  BP 138/78   Pulse 94   Temp 97.6 F (36.4 C) (Oral)   Resp 16   Ht 6' 4"  (1.93 m)   Wt (!) 307 lb 9.6 oz (139.5 kg)   SpO2 94%   BMI 37.44 kg/m   Physical Exam  Constitutional: He is oriented to person, place, and time. He appears well-developed and well-nourished.  HENT:  Head: Normocephalic and atraumatic.  Eyes: Conjunctivae are normal.  Neck: Neck supple.  Cardiovascular: Normal rate and regular rhythm.  Pulmonary/Chest: Effort normal and breath sounds normal. No stridor. No  respiratory distress. He has no wheezes. He has no rales. He exhibits no tenderness.  Neurological: He is alert and oriented to person, place, and time. He displays normal reflexes. No cranial nerve deficit or sensory deficit. He exhibits normal muscle tone. Coordination normal.  Vitals reviewed.   Recent Results (from the past 2160 hour(s))  CBC with Differential/Platelet     Status: None   Collection Time: 03/06/18 10:43 AM  Result Value Ref Range   WBC 6.0 4.0 - 10.5 K/uL   RBC 4.74 4.22 - 5.81 Mil/uL   Hemoglobin 16.0 13.0 - 17.0 g/dL   HCT 46.6 39.0 - 52.0 %   MCV 98.2 78.0 - 100.0 fl   MCHC 34.4 30.0 - 36.0 g/dL   RDW 13.7 11.5 - 15.5 %   Platelets 226.0 150.0 - 400.0 K/uL   Neutrophils Relative % 56.5 43.0 - 77.0 %   Lymphocytes Relative 30.7 12.0 - 46.0 %   Monocytes Relative 10.7 3.0 - 12.0 %   Eosinophils Relative 1.4 0.0 - 5.0 %   Basophils Relative 0.7 0.0 - 3.0 %   Neutro Abs 3.4 1.4 - 7.7 K/uL   Lymphs Abs 1.9 0.7 - 4.0 K/uL   Monocytes Absolute 0.6 0.1 - 1.0 K/uL   Eosinophils Absolute 0.1 0.0 - 0.7 K/uL   Basophils Absolute 0.0 0.0 - 0.1 K/uL  Comprehensive metabolic panel     Status: Abnormal   Collection Time: 03/06/18 10:43 AM  Result Value Ref Range   Sodium 138 135 - 145 mEq/L   Potassium 4.1 3.5 - 5.1 mEq/L   Chloride 102 96 - 112 mEq/L   CO2 28 19 - 32 mEq/L   Glucose, Bld 126 (H) 70 - 99 mg/dL   BUN 11 6 - 23 mg/dL   Creatinine, Ser 0.89 0.40 - 1.50 mg/dL   Total Bilirubin 0.8 0.2 - 1.2 mg/dL   Alkaline Phosphatase 57 39 - 117 U/L   AST 26 0 - 37 U/L   ALT 45 0 - 53 U/L   Total Protein 7.4 6.0 - 8.3 g/dL   Albumin 4.3 3.5 - 5.2 g/dL   Calcium 9.5 8.4 - 10.5 mg/dL   GFR 89.92 >60.00 mL/min  Lipid panel     Status: Abnormal   Collection Time: 03/06/18 10:43 AM  Result Value Ref Range   Cholesterol 163 0 - 200 mg/dL    Comment: ATP [name] Classification  Desirable:  < 200 mg/dL               Borderline High:  200 - 239 mg/dL          High:  > =  240 mg/dL   Triglycerides 76.0 0.0 - 149.0 mg/dL    Comment: Normal:  <150 mg/dLBorderline High:  150 - 199 mg/dL   HDL 33.10 (L) >39.00 mg/dL   VLDL 15.2 0.0 - 40.0 mg/dL   LDL Cholesterol 115 (H) 0 - 99 mg/dL   Total CHOL/HDL Ratio 5     Comment:                Men          Women1/2 Average Risk     3.4          3.3Average Risk          5.0          4.42X Average Risk          9.6          7.13X Average Risk          15.0          11.0                       NonHDL 129.79     Comment: NOTE:  Non-HDL goal should be 30 mg/dL higher than patient's LDL goal (i.e. LDL goal of < 70 mg/dL, would have non-HDL goal of < 100 mg/dL)  PSA     Status: None   Collection Time: 03/06/18 10:43 AM  Result Value Ref Range   PSA 1.39 0.10 - 4.00 ng/mL    Comment: Test performed using Access Hybritech PSA Assay, a parmagnetic partical, chemiluminecent immunoassay.  Hemoglobin A1c     Status: None   Collection Time: 03/07/18 11:40 AM  Result Value Ref Range   Hgb A1c MFr Bld 5.6 4.6 - 6.5 %    Comment: Glycemic Control Guidelines for People with Diabetes:Non Diabetic:  <6%Goal of Therapy: <7%Additional Action Suggested:  >8%    Assessment/Plan: 1. Chronic fatigue Likely multifactorial. Deconditioning/Obeisty  -- discussed proper diet and exercise. Will check lab panel today to assess CBC, TSH and Vitamin levels.  Concern for OSA as well. Will proceed with Pulm referral if lab workup negative.   - CBC w/Diff - TSH - Comp Met (CMET) - B12 - VITAMIN D 25 Hydroxy (Vit-D Deficiency, Fractures)   [name], PA-C

## 2018-06-03 ENCOUNTER — Other Ambulatory Visit: Payer: Self-pay

## 2018-06-03 DIAGNOSIS — R5382 Chronic fatigue, unspecified: Secondary | ICD-10-CM

## 2018-06-03 MED ORDER — VITAMIN D (ERGOCALCIFEROL) 1.25 MG (50000 UNIT) PO CAPS: 50000.0000 [IU] | ORAL_CAPSULE | ORAL | 0 refills | Status: DC

## 2018-06-09 DIAGNOSIS — R69 Illness, unspecified: Secondary | ICD-10-CM | POA: Diagnosis not present

## 2018-07-16 ENCOUNTER — Encounter: Payer: Self-pay | Admitting: Pulmonary Disease

## 2018-07-16 ENCOUNTER — Ambulatory Visit (INDEPENDENT_AMBULATORY_CARE_PROVIDER_SITE_OTHER): Payer: Medicare HMO | Admitting: Pulmonary Disease

## 2018-07-16 VITALS — BP 146/96 | HR 97 | Ht 76.0 in | Wt 309.1 lb

## 2018-07-16 DIAGNOSIS — R0683 Snoring: Secondary | ICD-10-CM

## 2018-07-16 NOTE — Patient Instructions (Signed)
Will arrange for home sleep study Will call to arrange for follow up after sleep study reviewed  

## 2018-07-16 NOTE — Progress Notes (Signed)
Pulmonary, Critical Care, and Sleep Medicine  Chief Complaint  Patient presents with  . sleep consult    Pt doesn't sleep well at night, wakes up 2-3 times each night. Pt wakes up not rested, takes one nap daily for around 15mins.     Constitutional:  BP (!) 146/96 (BP Location: Left Arm, Cuff Size: Normal)   Pulse 97   Ht 6\' 4"  (1.93 m)   Wt (!) 309 lb 1.9 oz (140.2 kg)   SpO2 97%   BMI 37.63 kg/m   Past Medical History:  Depression, GERD, HLD, HTN  Brief Summary:  Hector Edwards is a 72 y.o. male with snoring.  He has noticed feeling more sleepy and tired during the day.  He doesn't feel like he gets good sleepy.  He has to use energy drinks if he goes for a long drive.  He drools at night and dreams a lot.  He wakes frequently during his dreams.  He does snore some also.  He goes to sleep at 10 pm.  He falls asleep in 15 minues.  He wakes up 3 or 4 times to use the bathroom.  He gets out of bed at 8 am.  He feels tired in the morning.  He denies morning headache.  He does not use anything to help him fall sleep.  He denies sleep walking, sleep talking, bruxism, or nightmares.  There is no history of restless legs.  He denies sleep hallucinations, sleep paralysis, or cataplexy.  The Epworth score is 10 out of 24.     Physical Exam:   Appearance - well kempt  ENMT - clear nasal mucosa, midline nasal septum, no oral exudates, no LAN, trachea midline, high arched palate, enlarged tongue, MP3 Respiratory - normal chest wall, normal respiratory effort, no accessory muscle use, no wheeze/rales CV - s1s2 regular rate and rhythm, no murmurs, no peripheral edema, radial pulses symmetric GI - soft, non tender, no masses Lymph - no adenopathy noted in neck and axillary areas MSK - normal muscle strength and tone, normal gait Ext - no cyanosis, clubbing, or joint inflammation noted Skin - no rashes, lesions, or ulcers Neuro - oriented to person, place, and time Psych -  normal mood and affect  Discussion:  He has snoring, sleep disruption, and daytime sleepiness.  His BMI is > 35.  He has history of HTN and depression.  I am concerned he could have sleep apnea.  Assessment/Plan:   Snoring with excessive daytime sleepiness. - will need to arrange for a home sleep study  Obesity. - discussed how weight can impact sleep and risk for sleep disordered breathing - discussed options to assist with weight loss: combination of diet modification, cardiovascular and strength training exercises  Cardiovascular risk. - had an extensive discussion regarding the adverse health consequences related to untreated sleep disordered breathing - specifically discussed the risks for hypertension, coronary artery disease, cardiac dysrhythmias, cerebrovascular disease, and diabetes - lifestyle modification discussed  Safe driving practices. - discussed how sleep disruption can increase risk of accidents, particularly when driving - safe driving practices were discussed  Therapies for obstructive sleep apnea. - if the sleep study shows significant sleep apnea, then various therapies for treatment were reviewed: CPAP, oral appliance, and surgical interventions - he might be interested in learning more about Inspire as an option for treatment of sleep apnea     Patient Instructions  Will arrange for home sleep study Will call to arrange for follow up  after sleep study reviewed     Hector Mires, MD [place] Pager: [phone] 07/16/2018, 4:05 PM  Flow Sheet    Sleep tests:   Review of Systems:  Constitutional: Negative for fever and unexpected weight change.  HENT: Negative for congestion, dental problem, ear pain, nosebleeds, postnasal drip, rhinorrhea, sinus pressure, sneezing, sore throat and trouble swallowing.   Eyes: Negative for redness and itching.  Respiratory: Negative for cough, chest tightness, shortness of breath and  wheezing.   Cardiovascular: Positive for leg swelling. Negative for palpitations.  Gastrointestinal: Negative for nausea and vomiting.  Genitourinary: Negative for dysuria.  Musculoskeletal: Positive for joint swelling.  Skin: Negative for rash.  Allergic/Immunologic: Negative.  Negative for environmental allergies, food allergies and immunocompromised state.  Neurological: Negative for headaches.  Hematological: Does not bruise/bleed easily.  Psychiatric/Behavioral: Negative for dysphoric mood. The patient is not nervous/anxious.    Medications:   Allergies as of 07/16/2018      Reactions   Enalapril Maleate    REACTION: cramps   Hctz [hydrochlorothiazide]    SOB   Tribenzor [olmesartan-amlodipine-hctz] Swelling   Fatigue, CP, edema   Verapamil    REACTION: Fatigue      Medication List        Accurate as of 07/16/18  4:05 PM. Always use your most recent med list.          aspirin 81 MG tablet Take 81 mg by mouth daily.   benazepril 20 MG tablet Commonly known as:  LOTENSIN Take 2 tablets (40 mg total) by mouth daily.   CVS VITAMIN B12 1000 MCG tablet Generic drug:  cyanocobalamin Take 1,000 mcg by mouth daily.   gabapentin 300 MG capsule Commonly known as:  NEURONTIN 1 capsule at bedtime.   naproxen sodium 220 MG tablet Commonly known as:  ALEVE Take 220 mg by mouth 2 (two) times daily with a meal.   vitamin C 1000 MG tablet Take 1,000 mg by mouth daily.   Vitamin D (Ergocalciferol) 50000 units Caps capsule Commonly known as:  DRISDOL Take 1 capsule (50,000 Units total) by mouth every 7 (seven) days.       Past Surgical History:  He  has a past surgical history that includes Shoulder arthroscopy w/ rotator cuff repair; Knee arthroscopy (Right); Colonoscopy (2004); Tonsillectomy; and vocal cord growth removed.  Family History:  His family history includes Breast cancer in his daughter; Hypertension in his other; Pancreatic cancer in his brother; Throat  cancer in his brother.  Social History:  He  reports that he has never smoked. He has never used smokeless tobacco. He reports that he drinks about 4.0 standard drinks of alcohol per week. He reports that he does not use drugs.

## 2018-07-16 NOTE — Progress Notes (Signed)
   Subjective:    Patient ID: Hector Edwards, male    DOB: 1946-02-19, 72 y.o.   MRN: [id number]  HPI    Review of Systems  Constitutional: Negative for fever and unexpected weight change.  HENT: Negative for congestion, dental problem, ear pain, nosebleeds, postnasal drip, rhinorrhea, sinus pressure, sneezing, sore throat and trouble swallowing.   Eyes: Negative for redness and itching.  Respiratory: Negative for cough, chest tightness, shortness of breath and wheezing.   Cardiovascular: Positive for leg swelling. Negative for palpitations.  Gastrointestinal: Negative for nausea and vomiting.  Genitourinary: Negative for dysuria.  Musculoskeletal: Positive for joint swelling.  Skin: Negative for rash.  Allergic/Immunologic: Negative.  Negative for environmental allergies, food allergies and immunocompromised state.  Neurological: Negative for headaches.  Hematological: Does not bruise/bleed easily.  Psychiatric/Behavioral: Negative for dysphoric mood. The patient is not nervous/anxious.        Objective:   Physical Exam        Assessment & Plan:

## 2018-07-31 DIAGNOSIS — G4733 Obstructive sleep apnea (adult) (pediatric): Secondary | ICD-10-CM | POA: Diagnosis not present

## 2018-08-01 ENCOUNTER — Telehealth: Payer: Self-pay | Admitting: Pulmonary Disease

## 2018-08-01 ENCOUNTER — Encounter: Payer: Self-pay | Admitting: Pulmonary Disease

## 2018-08-01 DIAGNOSIS — G4733 Obstructive sleep apnea (adult) (pediatric): Secondary | ICD-10-CM | POA: Diagnosis not present

## 2018-08-01 HISTORY — DX: Obstructive sleep apnea (adult) (pediatric): G47.33

## 2018-08-01 NOTE — Telephone Encounter (Signed)
HST 07/31/18 >> AHI 65.8, SpO2 low 65%.   Please let him know that the sleep study showed severe obstructive sleep apnea.  Please arrange for ROV with a nurse practitioner to review treatment options.

## 2018-08-04 NOTE — Telephone Encounter (Signed)
Called pt and advised message from the provider. Pt understood and verbalized understanding. Nothing further is needed.   Pt scheduled to see Beth tomorrow at 9:30 pm

## 2018-08-05 ENCOUNTER — Encounter: Payer: Self-pay | Admitting: Primary Care

## 2018-08-05 ENCOUNTER — Other Ambulatory Visit: Payer: Self-pay | Admitting: *Deleted

## 2018-08-05 ENCOUNTER — Ambulatory Visit: Payer: Medicare HMO | Admitting: Primary Care

## 2018-08-05 DIAGNOSIS — G4733 Obstructive sleep apnea (adult) (pediatric): Secondary | ICD-10-CM | POA: Diagnosis not present

## 2018-08-05 DIAGNOSIS — R0683 Snoring: Secondary | ICD-10-CM

## 2018-08-05 NOTE — Assessment & Plan Note (Addendum)
HST 07/31/18 >> AHI 65.8, SpO2 low 65%. Patient is not interested in CPAP He would like to consider oral appliance or Inspire device Given names and numbers of Dr. [name] Parker DDS and Dr. Redmond Baseman with ENT  Follow up in 3 months   Instructed not to drive if experiencing excessive daytime fatigue/somnolence or take sedation medication/alchol prior to bedtime

## 2018-08-05 NOTE — Progress Notes (Signed)
@Patient  ID: Hector Edwards, male    DOB: 08-18-1946, 72 y.o.   MRN: [id number]  Chief Complaint  Patient presents with  . Follow-up    osa-treatment options    Referring provider: Delorse Limber  HPI: 71 year old male, never smoked.  Patient of Dr. Dossie Der seen for initial consult on October 9 for snoring.  Associated fatigue. Patient states that he wakes up around to 3 times at night and needs daily 30 min nap. Epworth score 10 out of 24.  Needs home sleep study, encourage weight loss.  HST 07/31/18 >> AHI 65.8, SpO2 low 65%.  08/05/2018 Patient presents today to review home sleep study completed on October 24 which showed severe's obstructive sleep apnea.  Patient is 100% not interested in trying CPAP, states that he has heard of family and friends having a lot of trouble with it.  He is interested in pursuing inspire device or an oral appliance.    Allergies  Allergen Reactions  . Enalapril Maleate     REACTION: cramps  . Hctz [Hydrochlorothiazide]     SOB  . Tribenzor [Olmesartan-Amlodipine-Hctz] Swelling    Fatigue, CP, edema  . Verapamil     REACTION: Fatigue    Immunization History  Administered Date(s) Administered  . Influenza Split 08/08/2011  . Influenza, High Dose Seasonal PF 06/09/2018  . Influenza,inj,Quad PF,6+ Mos 06/20/2015, 11/13/2016, 06/24/2017  . Pneumococcal Conjugate-13 11/02/2013  . Pneumococcal Polysaccharide-23 06/20/2015  . Tdap 11/02/2013  . Zoster 10/13/2009  . Zoster Recombinat (Shingrix) 03/06/2018, 06/09/2018    Past Medical History:  Diagnosis Date  . Depression   . GERD (gastroesophageal reflux disease)   . Hx of adenomatous colonic polyps 09/04/2015  . Hyperlipidemia   . Hypertension   . LBP (low back pain)   . OSA (obstructive sleep apnea) 08/01/2018    Tobacco History: Social History   Tobacco Use  Smoking Status Never Smoker  Smokeless Tobacco Never Used   Counseling given: Not Answered   Outpatient  Medications Prior to Visit  Medication Sig Dispense Refill  . Ascorbic Acid (VITAMIN C) 1000 MG tablet Take 1,000 mg by mouth daily.    Marland Kitchen aspirin 81 MG tablet Take 81 mg by mouth daily.    . benazepril (LOTENSIN) 20 MG tablet Take 2 tablets (40 mg total) by mouth daily. 60 tablet 3  . cyanocobalamin (CVS VITAMIN B12) 1000 MCG tablet Take 1,000 mcg by mouth daily.    Marland Kitchen gabapentin (NEURONTIN) 300 MG capsule 1 capsule at bedtime.  0  . naproxen sodium (ANAPROX) 220 MG tablet Take 220 mg by mouth 2 (two) times daily with a meal.     . Vitamin D, Ergocalciferol, (DRISDOL) 50000 units CAPS capsule Take 1 capsule (50,000 Units total) by mouth every 7 (seven) days. 12 capsule 0   No facility-administered medications prior to visit.     Review of Systems  Review of Systems  Constitutional: Positive for fatigue.  HENT: Negative.   Respiratory: Positive for apnea.   Cardiovascular: Negative.     Physical Exam  BP 130/88 (BP Location: Left Arm, Cuff Size: Normal)   Pulse 91   Ht 6\' 4"  (1.93 m)   Wt (!) 308 lb (139.7 kg)   SpO2 99%   BMI 37.49 kg/m  Physical Exam  Constitutional: He is oriented to person, place, and time. He appears well-developed and well-nourished.  HENT:  Head: Normocephalic and atraumatic.  Mallampati class 3  Eyes: Pupils are equal, round, and reactive  to light. EOM are normal.  Neck: Normal range of motion. Neck supple.  Cardiovascular: Normal rate, regular rhythm, normal heart sounds and intact distal pulses.  RRR  Pulmonary/Chest: Effort normal and breath sounds normal. No respiratory distress. He has no wheezes.  CTA  Abdominal: Soft. Bowel sounds are normal. There is no tenderness.  Neurological: He is alert and oriented to person, place, and time.  Skin: Skin is warm and dry. No rash noted. No erythema.  Psychiatric: He has a normal mood and affect. His behavior is normal. Judgment normal.     Lab Results:  CBC    Component Value Date/Time   WBC 8.0  06/02/2018 1448   RBC 4.54 06/02/2018 1448   HGB 15.3 06/02/2018 1448   HCT 44.8 06/02/2018 1448   PLT 231.0 06/02/2018 1448   MCV 98.6 06/02/2018 1448   MCH 33.0 05/05/2014 0948   MCHC 34.2 06/02/2018 1448   RDW 13.8 06/02/2018 1448   LYMPHSABS 2.3 06/02/2018 1448   MONOABS 1.0 06/02/2018 1448   EOSABS 0.1 06/02/2018 1448   BASOSABS 0.0 06/02/2018 1448    BMET    Component Value Date/Time   NA 141 06/02/2018 1448   K 4.2 06/02/2018 1448   CL 104 06/02/2018 1448   CO2 29 06/02/2018 1448   GLUCOSE 80 06/02/2018 1448   BUN 11 06/02/2018 1448   CREATININE 0.98 06/02/2018 1448   CREATININE 0.87 05/05/2014 0948   CALCIUM 10.1 06/02/2018 1448   GFRNONAA >89 05/05/2014 0948   GFRAA >89 05/05/2014 0948    BNP No results found for: BNP  ProBNP No results found for: PROBNP  Imaging: No results found.   Assessment & Plan:   OSA (obstructive sleep apnea) HST 07/31/18 >> AHI 65.8, SpO2 low 65%. Patient is not interested in CPAP He would like to consider oral appliance or Inspire device Given names and numbers of Dr. [name] Parker DDS and Dr. Redmond Baseman with ENT  Follow up in 3 months   Instructed not to drive if experiencing excessive daytime fatigue/somnolence or take sedation medication/alchol prior to bedtime    Martyn Ehrich, NP 08/05/2018

## 2018-08-05 NOTE — Patient Instructions (Signed)
Referral to Dr. Melida Quitter ENT [phone]) - for OSA, considering Inspire device  Please speak with your dentist about oral appliance options [facility] Grout [facility]ist in [place] [phone]) Augustina Mood Dentist in [place] [phone])  Follow-up in 3 months with Dr. Halford Chessman or Eustaquio Maize NP

## 2018-08-06 NOTE — Progress Notes (Signed)
Reviewed and agree with assessment/plan.   Minette Manders, MD [place] [facility] 10/03/2016, 12:24 PM Pager:  [phone]  

## 2018-08-12 DIAGNOSIS — G4733 Obstructive sleep apnea (adult) (pediatric): Secondary | ICD-10-CM | POA: Diagnosis not present

## 2018-08-12 DIAGNOSIS — J343 Hypertrophy of nasal turbinates: Secondary | ICD-10-CM | POA: Diagnosis not present

## 2018-08-12 DIAGNOSIS — J342 Deviated nasal septum: Secondary | ICD-10-CM | POA: Diagnosis not present

## 2018-08-12 DIAGNOSIS — Z7289 Other problems related to lifestyle: Secondary | ICD-10-CM | POA: Diagnosis not present

## 2018-08-19 ENCOUNTER — Telehealth: Payer: Self-pay | Admitting: Primary Care

## 2018-08-19 DIAGNOSIS — G4733 Obstructive sleep apnea (adult) (pediatric): Secondary | ICD-10-CM

## 2018-08-19 NOTE — Telephone Encounter (Signed)
Called and spoke to patient, patient reports he went to ENT consult and he did not qualify for [facility] Sleep Device. Patient states he would like to try the cpap.   EW please advise of Cpap settings. Thanks.

## 2018-08-19 NOTE — Telephone Encounter (Signed)
Referral to DME for new CPAP start, auto titrate 5-15cm H20 with heated humidity. Mask of choice and supplies. Needs follow up in 31-90 days after starting with download in 1 month. Thank you

## 2018-08-19 NOTE — Telephone Encounter (Signed)
Order has been placed for pt to begin on CPAP. Called and spoke with pt letting him know that we did place the order and asked pt if I could go ahead and schedule him a follow up appt at our office after the start on cpap.  Pt stated that was fine and an appt has been scheduled for pt to see Derl Barrow, NP February 3 at 9:30. Nothing further needed.

## 2018-08-25 DIAGNOSIS — G4733 Obstructive sleep apnea (adult) (pediatric): Secondary | ICD-10-CM | POA: Diagnosis not present

## 2018-09-01 ENCOUNTER — Other Ambulatory Visit: Payer: Self-pay | Admitting: Physician Assistant

## 2018-09-24 DIAGNOSIS — G4733 Obstructive sleep apnea (adult) (pediatric): Secondary | ICD-10-CM | POA: Diagnosis not present

## 2018-10-25 DIAGNOSIS — G4733 Obstructive sleep apnea (adult) (pediatric): Secondary | ICD-10-CM | POA: Diagnosis not present

## 2018-11-07 NOTE — Progress Notes (Signed)
@Patient  ID: Hector Edwards, male    DOB: 01-21-1946, 73 y.o.   MRN: [id number]  Chief Complaint  Patient presents with  . Follow-up    F/U for CPAP. States he is still getting used to machine. Uses [facility] as his DME.     Referring provider: Delorse Limber  HPI: 73 year old male, never smoked. PMH significant for OSA, HTN, GERD. Patient of Dr. Halford Chessman seen for initial consult on October 9 for snoring.     Tests:  Sleep study: HST 07/31/18 >> AHI 65.8, SpO2 low 65%.  Results of the Epworth flowsheet 11/10/2018 07/16/2018  Sitting and reading 1 1  Watching TV 1 1  Sitting, inactive in a public place (e.g. a theatre or a meeting) 0 1  As a passenger in a car for an hour without a break 1 1  Lying down to rest in the afternoon when circumstances permit 0 1  Sitting and talking to someone 0 1  Sitting quietly after a lunch without alcohol 1 2  In a car, while stopped for a few minutes in traffic 1 2  Total score 5 10    OV 11/10/18 - follow up CPAP  Patient presents today for 3 month follow-up. He was seen by Dr. Redmond Baseman with otolaryngology and did not qualify for inspire device. Patient agreed to CPAP trial, referred in November 2019.  Patient states that he has been doing well with CPAP.  He wears it nightly.  States that he is less drowsy during the day.  He is sleeping much better throughout the night.  He states that he is not happy with his facemask but he did have a mask fitting with [facility] and his current mask was the best option for him so he will continue to use it.  He denies any shortness of breath, chest pain, or edema.    Allergies  Allergen Reactions  . Enalapril Maleate     REACTION: cramps  . Hctz [Hydrochlorothiazide]     SOB  . Tribenzor [Olmesartan-Amlodipine-Hctz] Swelling    Fatigue, CP, edema  . Verapamil     REACTION: Fatigue    Immunization History  Administered Date(s) Administered  . Influenza Split 08/08/2011  . Influenza, High Dose Seasonal  PF 06/09/2018  . Influenza,inj,Quad PF,6+ Mos 06/20/2015, 11/13/2016, 06/24/2017  . Pneumococcal Conjugate-13 11/02/2013  . Pneumococcal Polysaccharide-23 06/20/2015  . Tdap 11/02/2013  . Zoster 10/13/2009  . Zoster Recombinat (Shingrix) 03/06/2018, 06/09/2018    Past Medical History:  Diagnosis Date  . Depression   . GERD (gastroesophageal reflux disease)   . Hx of adenomatous colonic polyps 09/04/2015  . Hyperlipidemia   . Hypertension   . LBP (low back pain)   . OSA (obstructive sleep apnea) 08/01/2018    Tobacco History: Social History   Tobacco Use  Smoking Status Never Smoker  Smokeless Tobacco Never Used   Counseling given: Yes   Outpatient Medications Prior to Visit  Medication Sig Dispense Refill  . Ascorbic Acid (VITAMIN C) 1000 MG tablet Take 1,000 mg by mouth daily.    Marland Kitchen aspirin 81 MG tablet Take 81 mg by mouth daily.    . benazepril (LOTENSIN) 20 MG tablet Take 2 tablets (40 mg total) by mouth daily. 60 tablet 3  . cyanocobalamin ([employer] VITAMIN B12) 1000 MCG tablet Take 1,000 mcg by mouth daily.    Marland Kitchen gabapentin (NEURONTIN) 300 MG capsule 1 capsule at bedtime.  0  . naproxen sodium (ANAPROX) 220 MG  tablet Take 220 mg by mouth 2 (two) times daily with a meal.     . pantoprazole (PROTONIX) 40 MG tablet TAKE 1 TABLET BY MOUTH ONCE DAILY 90 tablet 1  . Vitamin D, Ergocalciferol, (DRISDOL) 50000 units CAPS capsule Take 1 capsule (50,000 Units total) by mouth every 7 (seven) days. 12 capsule 0   No facility-administered medications prior to visit.       Review of Systems  Review of Systems  Constitutional: Negative.  Negative for chills and fever.  HENT: Negative.   Respiratory: Negative for cough, shortness of breath and wheezing.   Cardiovascular: Negative.  Negative for chest pain, palpitations and leg swelling.  Gastrointestinal: Negative.   Allergic/Immunologic: Negative.   Neurological: Negative.   Psychiatric/Behavioral: Negative.      Physical  Exam  BP 116/74 (BP Location: Left Arm, Patient Position: Sitting, Cuff Size: Large)   Pulse 83   Ht 6\' 4"  (1.93 m)   Wt (!) 310 lb 3.2 oz (140.7 kg)   SpO2 97%   BMI 37.76 kg/m  Physical Exam Vitals signs and nursing note reviewed.  Constitutional:      General: He is not in acute distress.    Appearance: He is well-developed.  Cardiovascular:     Rate and Rhythm: Normal rate and regular rhythm.  Pulmonary:     Effort: Pulmonary effort is normal. No respiratory distress.     Breath sounds: Normal breath sounds. No wheezing or rhonchi.  Skin:    General: Skin is warm and dry.  Neurological:     Mental Status: He is alert and oriented to person, place, and time.        Assessment & Plan:   OSA (obstructive sleep apnea) Patient was not a candidate for inspire device. Has been doing well with CPAP.   Patient Instructions  Patient continues to benefit from CPAP with good compliance and control documented Continue CPAP at current settings Please make sure mask is on correctly Continue current medications Goal of 4 hours or more usage per night Maintain healthy weight Do not drive if drowsy Follow up with Dr. Halford Chessman or Me in 2 months or sooner if needed        Fenton Foy, NP 11/10/2018

## 2018-11-10 ENCOUNTER — Ambulatory Visit: Payer: Medicare HMO | Admitting: Nurse Practitioner

## 2018-11-10 ENCOUNTER — Encounter: Payer: Self-pay | Admitting: Nurse Practitioner

## 2018-11-10 DIAGNOSIS — G4733 Obstructive sleep apnea (adult) (pediatric): Secondary | ICD-10-CM

## 2018-11-10 NOTE — Assessment & Plan Note (Signed)
Patient was not a candidate for inspire device. Has been doing well with CPAP.   Patient Instructions  Patient continues to benefit from CPAP with good compliance and control documented Continue CPAP at current settings Please make sure mask is on correctly Continue current medications Goal of 4 hours or more usage per night Maintain healthy weight Do not drive if drowsy Follow up with Dr. Halford Chessman or Me in 2 months or sooner if needed

## 2018-11-10 NOTE — Progress Notes (Signed)
Reviewed and agree with assessment/plan.   Chaze Hruska, MD [facility] 10/03/2016, 12:24 PM Pager:  [phone]  

## 2018-11-10 NOTE — Patient Instructions (Signed)
Patient continues to benefit from CPAP with good compliance and control documented Continue CPAP at current settings Please make sure mask is on correctly Continue current medications Goal of 4 hours or more usage per night Maintain healthy weight Do not drive if drowsy Follow up with Dr. Halford Chessman or Me in 2 months or sooner if needed

## 2018-11-25 DIAGNOSIS — G4733 Obstructive sleep apnea (adult) (pediatric): Secondary | ICD-10-CM | POA: Diagnosis not present

## 2018-12-10 ENCOUNTER — Encounter: Payer: Self-pay | Admitting: Internal Medicine

## 2018-12-15 ENCOUNTER — Other Ambulatory Visit: Payer: Self-pay | Admitting: Physician Assistant

## 2018-12-16 DIAGNOSIS — L820 Inflamed seborrheic keratosis: Secondary | ICD-10-CM | POA: Diagnosis not present

## 2018-12-16 DIAGNOSIS — L821 Other seborrheic keratosis: Secondary | ICD-10-CM | POA: Diagnosis not present

## 2018-12-16 DIAGNOSIS — L218 Other seborrheic dermatitis: Secondary | ICD-10-CM | POA: Diagnosis not present

## 2018-12-16 DIAGNOSIS — L738 Other specified follicular disorders: Secondary | ICD-10-CM | POA: Diagnosis not present

## 2018-12-24 DIAGNOSIS — G4733 Obstructive sleep apnea (adult) (pediatric): Secondary | ICD-10-CM | POA: Diagnosis not present

## 2019-01-01 DIAGNOSIS — E291 Testicular hypofunction: Secondary | ICD-10-CM | POA: Diagnosis not present

## 2019-01-01 DIAGNOSIS — R972 Elevated prostate specific antigen [PSA]: Secondary | ICD-10-CM | POA: Diagnosis not present

## 2019-01-24 DIAGNOSIS — G4733 Obstructive sleep apnea (adult) (pediatric): Secondary | ICD-10-CM | POA: Diagnosis not present

## 2019-02-06 ENCOUNTER — Other Ambulatory Visit: Payer: Self-pay | Admitting: Physician Assistant

## 2019-02-09 ENCOUNTER — Other Ambulatory Visit: Payer: Self-pay

## 2019-02-09 ENCOUNTER — Encounter: Payer: Self-pay | Admitting: Nurse Practitioner

## 2019-02-09 ENCOUNTER — Other Ambulatory Visit: Payer: Self-pay | Admitting: General Surgery

## 2019-02-09 ENCOUNTER — Ambulatory Visit (INDEPENDENT_AMBULATORY_CARE_PROVIDER_SITE_OTHER): Payer: Medicare HMO | Admitting: Nurse Practitioner

## 2019-02-09 ENCOUNTER — Ambulatory Visit: Payer: Medicare HMO | Admitting: Pulmonary Disease

## 2019-02-09 DIAGNOSIS — G4733 Obstructive sleep apnea (adult) (pediatric): Secondary | ICD-10-CM

## 2019-02-09 NOTE — Patient Instructions (Addendum)
Patient continues to benefit from CPAP with good compliance and control documented Will have DME company check mask fit again due to leaks Continue CPAP at current settings Continue current medications Goal of 4 hours or more usage per night Maintain healthy weight - will order referral for weight management Do not drive if drowsy  Follow up: Follow up with Dr. Halford Chessman in 4 months or sooner if needed

## 2019-02-09 NOTE — Progress Notes (Addendum)
Virtual Visit via Telephone Note  I connected with Hector Edwards on 02/09/19 at  9:30 AM EDT by telephone and verified that I am speaking with the correct person using two identifiers.  Location: Patient: home Provider: office   I discussed the limitations, risks, security and privacy concerns of performing an evaluation and management service by telephone and the availability of in person appointments. I also discussed with the patient that there may be a patient responsible charge related to this service. The patient expressed understanding and agreed to proceed.   History of Present Illness: 73 year old male, never smoked. PMH significant for OSA, HTN, GERD. Patient of Dr. Halford Chessman seen for initial consult on October 9 for snoring.     Patient has a follow-up tele-visit today.  States that he has been doing well with CPAP.  He wear it nightly.  States that he does benefit from use and feels much less drowsy during the day.  He denies any current issues with his mask.  States that overall he has been doing well. Denies f/c/s, n/v/d, hemoptysis, PND, leg swelling.     Observations/Objective: HST 07/31/18 >> AHI 65.8, SpO2 low 65%.  CPAP compliance report 01/06/19 - 02/04/19: usage days 27/30 (90%), average usage 7 hours 39 minutes, CPAP autoset 5-15 cmH20, AHI: 17.8.   Assessment and Plan: Patient is doing well with his CPAP.  He denies any current issues.  On review of his CPAP compliance report his AHI continues to be high at 17.8.  We will have DME company check patient's machine and mask fit -concern for leaks.  Patient Instructions  Patient continues to benefit from CPAP with good compliance and control documented Will have DME company check mask fit again due to leaks Continue CPAP at current settings Continue current medications Goal of 4 hours or more usage per night Maintain healthy weight - will order referral for weight management  Do not drive if drowsy    Follow Up  Instructions:  Follow up with Dr. Halford Chessman in 4 months or sooner if needed   I discussed the assessment and treatment plan with the patient. The patient was provided an opportunity to ask questions and all were answered. The patient agreed with the plan and demonstrated an understanding of the instructions.   The patient was advised to call back or seek an in-person evaluation if the symptoms worsen or if the condition fails to improve as anticipated.  I provided 22 minutes of non-face-to-face time during this encounter.   Fenton Foy, NP

## 2019-02-09 NOTE — Assessment & Plan Note (Addendum)
Patient is doing well with his CPAP.  He denies any current issues.  On review of his CPAP compliance report his AHI continues to be high at 17.8.  We will have DME company check patient's machine and mask fit -concern for leaks. Patient is struggling with weight loss.   Patient Instructions  Patient continues to benefit from CPAP with good compliance and control documented Will have DME company check mask fit again due to leaks Continue CPAP at current settings Continue current medications Goal of 4 hours or more usage per night Maintain healthy weight - will place referral for weight management Do not drive if drowsy  Follow up: Follow up with Dr. Halford Chessman in 4 months or sooner if needed

## 2019-02-23 DIAGNOSIS — G4733 Obstructive sleep apnea (adult) (pediatric): Secondary | ICD-10-CM | POA: Diagnosis not present

## 2019-03-04 ENCOUNTER — Other Ambulatory Visit: Payer: Self-pay | Admitting: Physician Assistant

## 2019-03-26 DIAGNOSIS — G4733 Obstructive sleep apnea (adult) (pediatric): Secondary | ICD-10-CM | POA: Diagnosis not present

## 2019-04-25 DIAGNOSIS — G4733 Obstructive sleep apnea (adult) (pediatric): Secondary | ICD-10-CM | POA: Diagnosis not present

## 2019-05-11 DIAGNOSIS — Z8249 Family history of ischemic heart disease and other diseases of the circulatory system: Secondary | ICD-10-CM | POA: Diagnosis not present

## 2019-05-11 DIAGNOSIS — K219 Gastro-esophageal reflux disease without esophagitis: Secondary | ICD-10-CM | POA: Diagnosis not present

## 2019-05-11 DIAGNOSIS — I10 Essential (primary) hypertension: Secondary | ICD-10-CM | POA: Diagnosis not present

## 2019-05-11 DIAGNOSIS — Z823 Family history of stroke: Secondary | ICD-10-CM | POA: Diagnosis not present

## 2019-05-11 DIAGNOSIS — E669 Obesity, unspecified: Secondary | ICD-10-CM | POA: Diagnosis not present

## 2019-05-17 ENCOUNTER — Other Ambulatory Visit: Payer: Self-pay | Admitting: Physician Assistant

## 2019-05-18 NOTE — Telephone Encounter (Signed)
Patient is due for an follow up appointment/CPE

## 2019-05-19 ENCOUNTER — Encounter: Payer: Self-pay | Admitting: Emergency Medicine

## 2019-05-19 NOTE — Progress Notes (Signed)
Reviewed and agree with assessment/plan.   Hulen Mandler, MD [facility] 10/03/2016, 12:24 PM Pager:  [phone]  

## 2019-05-26 DIAGNOSIS — G4733 Obstructive sleep apnea (adult) (pediatric): Secondary | ICD-10-CM | POA: Diagnosis not present

## 2019-06-16 ENCOUNTER — Encounter (INDEPENDENT_AMBULATORY_CARE_PROVIDER_SITE_OTHER): Payer: Self-pay | Admitting: Family Medicine

## 2019-06-16 ENCOUNTER — Encounter: Payer: Self-pay | Admitting: Family Medicine

## 2019-06-16 ENCOUNTER — Other Ambulatory Visit: Payer: Self-pay

## 2019-06-16 ENCOUNTER — Ambulatory Visit (INDEPENDENT_AMBULATORY_CARE_PROVIDER_SITE_OTHER): Payer: Medicare HMO | Admitting: Family Medicine

## 2019-06-16 VITALS — BP 169/98 | HR 90 | Temp 98.1°F | Ht 75.0 in | Wt 302.0 lb

## 2019-06-16 DIAGNOSIS — R5383 Other fatigue: Secondary | ICD-10-CM

## 2019-06-16 DIAGNOSIS — R739 Hyperglycemia, unspecified: Secondary | ICD-10-CM

## 2019-06-16 DIAGNOSIS — E559 Vitamin D deficiency, unspecified: Secondary | ICD-10-CM | POA: Diagnosis not present

## 2019-06-16 DIAGNOSIS — R0602 Shortness of breath: Secondary | ICD-10-CM

## 2019-06-16 DIAGNOSIS — I10 Essential (primary) hypertension: Secondary | ICD-10-CM | POA: Diagnosis not present

## 2019-06-16 DIAGNOSIS — R9431 Abnormal electrocardiogram [ECG] [EKG]: Secondary | ICD-10-CM

## 2019-06-16 DIAGNOSIS — Z1331 Encounter for screening for depression: Secondary | ICD-10-CM | POA: Diagnosis not present

## 2019-06-16 DIAGNOSIS — G4733 Obstructive sleep apnea (adult) (pediatric): Secondary | ICD-10-CM

## 2019-06-16 DIAGNOSIS — Z6837 Body mass index (BMI) 37.0-37.9, adult: Secondary | ICD-10-CM

## 2019-06-16 DIAGNOSIS — Z0289 Encounter for other administrative examinations: Secondary | ICD-10-CM

## 2019-06-16 DIAGNOSIS — E7849 Other hyperlipidemia: Secondary | ICD-10-CM | POA: Diagnosis not present

## 2019-06-16 NOTE — Progress Notes (Signed)
Office: [phone]  /  Fax: [phone]   Dear Hector Noble, PA-C,   Thank you for referring Hector Edwards to our clinic. The following note includes my evaluation and treatment recommendations.  HPI:   Chief Complaint: OBESITY    Hector Edwards has been referred by Hector Noble, PA-C for consultation regarding his obesity and obesity related comorbidities.    Hector Edwards (MR# [id number]) is a 73 y.o. male who presents on 06/16/2019 for obesity evaluation and treatment. Current BMI is Body mass index is 37.75 kg/m.Hector Edwards has been struggling with his weight for many years and has been unsuccessful in either losing weight, maintaining weight loss, or reaching his healthy weight goal.     Hector Edwards attended our information session and states he is currently in the action stage of change and ready to dedicate time achieving and maintaining a healthier weight. Hector Edwards is interested in becoming our Hector Edwards and working on intensive lifestyle modifications including (but not limited to) diet, exercise and weight loss.    Hector Edwards states his desired weight loss is 52 lbs he started gaining weight when he retired his heaviest weight ever was 315 lbs. he skips lunch 2 times per week he is frequently drinking liquids with calories he struggles with emotional eating      [name] has not ever really followed a formal eating plan.   Fatigue Hector Edwards feels his energy is lower than it should be. This has worsened with weight gain and has worsened recently. Hector Edwards admits to daytime somnolence and admits to waking up still tired. Hector Edwards is at risk for obstructive sleep apnea. Patent has a history of symptoms of daytime fatigue and Epworth sleepiness scale. Hector Edwards generally gets 8 hours of sleep per night, and states they generally have restful sleep. Snoring is present. Apneic episodes are present. Epworth Sleepiness Score is 19.  Dyspnea on exertion [name] notes increasing shortness of breath  with exercising and seems to be worsening over time with weight gain. He notes getting out of breath sooner with activity than he used to. This has gotten worse recently. [name] denies orthopnea.  Hypertension [name] is a 73 y.o. male with hypertension and is no longer on blood pressure medication.  [name] denies chest pain. He is working weight loss to help control his blood pressure with the goal of decreasing his risk of heart attack and stroke. [name] blood pressure is elevated today. He reports using his CPAP most nights.  Obstructive Sleep Apnea (OSA) Bexley has a history of obstructive sleep apnea and is Epworth score is 19 today. He states he uses his CPAP most nights. His blood pressure is noted to be elevated today.  Hyperglycemia [name] has a history of some elevated blood glucose and A1c readings. He admits to polyphagia.  Hyperlipidemia [name] has hyperlipidemia and is not on a statin. He has been attempting to diet control. He denies any chest pain, claudication or myalgias.  Vitamin D deficiency [name] has a diagnosis of Vitamin D deficiency. He is currently not taking Vit D and denies nausea, vomiting or muscle weakness but does admit to fatigue.  Abnormal EKG with LVH [name] has a history of hypertension and obstructive sleep apnea. He denies chest pain, but does report shortness of breath with activity.  Depression Screen [name] Food and Mood (modified PHQ-9) score was 3. Depression screen PHQ 2/9 06/16/2019  Decreased Interest 1  Down, Depressed, Hopeless 0  PHQ - 2 Score 1  Altered sleeping  0  Tired, decreased energy 1  Change in appetite 1  Feeling bad or failure about yourself  0  Trouble concentrating 0  Moving slowly or fidgety/restless 0  Suicidal thoughts 0  PHQ-9 Score 3  Difficult doing work/chores Not difficult at all    ASSESSMENT AND PLAN:  Other fatigue - Plan: EKG 12-Lead, Comprehensive metabolic panel, CBC with  Differential/Platelet, T3, T4, free, TSH  SOB (shortness of breath) on exertion - Plan: Lipid Panel With LDL/HDL Ratio, ECHOCARDIOGRAM COMPLETE  Essential hypertension  OSA (obstructive sleep apnea)  Hyperglycemia - Plan: Hemoglobin A1c, Insulin, random  Other hyperlipidemia - Plan: Lipid Panel With LDL/HDL Ratio  Vitamin D deficiency - Plan: VITAMIN D 25 Hydroxy (Vit-D Deficiency, Fractures)  Abnormal EKG - Plan: ECHOCARDIOGRAM COMPLETE  Depression screening  Class 2 severe obesity with serious comorbidity and body mass index (BMI) of 37.0 to 37.9 in adult, unspecified obesity type (HCC)  PLAN:  Fatigue [name] was informed that his fatigue may be related to obesity, depression or many other causes. Labs will be ordered, and in the meanwhile [name] has agreed to work on diet, exercise and weight loss to help with fatigue. Proper sleep hygiene was discussed including the need for 7-8 hours of quality sleep each night. A sleep study was not ordered based on symptoms and Epworth score.  Dyspnea on exertion Hector Edwards's shortness of breath appears to be obesity related and exercise induced. He has agreed to work on weight loss and gradually increase exercise to treat his exercise induced shortness of breath. If Hector Edwards follows our instructions and loses weight without improvement of his shortness of breath, we will plan to refer to pulmonology. We will monitor this condition regularly. Hector Edwards agrees to this plan.  Hypertension We discussed sodium restriction, working on healthy weight loss, and a regular exercise program as the means to achieve improved blood pressure control. Hector Edwards agreed with this plan and agreed to follow up as directed. We will continue to monitor his blood pressure as well as his progress with the above lifestyle modifications. He will have his blood pressure checked in 2 weeks. He will start his diet prescription and will watch for signs of hypotension as he continues his  lifestyle modifications.  Obstructive Sleep Apnea (OSA) Hector Edwards will continue CPAP and start on diet prescription.  Hyperglycemia Fasting labs will be obtained and results with be discussed with Hector Edwards in 2 weeks at his follow up visit. In the meanwhile Hector Edwards was started on a lower simple carbohydrate diet and will work on weight loss efforts.  Hyperlipidemia Hector Edwards was informed of the American Heart Association Guidelines emphasizing intensive lifestyle modifications as the first line treatment for hyperlipidemia. We discussed many lifestyle modifications today in depth, and Hector Edwards will continue to work on decreasing saturated fats such as fatty red meat, butter and many fried foods. Romaro will have labs checked and start his diet prescription. He will also increase vegetables and lean protein in his diet and continue to work on exercise and weight loss efforts.  Vitamin D Deficiency Hector Edwards was informed that low Vitamin D levels contributes to fatigue and are associated with obesity, breast, and colon cancer. He will have routine testing of Vitamin D today and agrees to follow-up with our clinic in 2 weeks.  Abnormal EKG with LVH Hector Edwards will have echo and will follow-up in 2 weeks as directed.  Depression Screen Hector Edwards had a negative depression screening. Depression is commonly associated with obesity and often results in emotional eating  behaviors. We will monitor this closely and work on CBT to help improve the non-hunger eating patterns.   Obesity Hector Edwards is currently in the action stage of change and his goal is to continue with weight loss efforts. I recommend Hector Edwards begin the structured treatment plan as follows:  He has agreed to follow the Category 3 plan.  Jaelynn has been instructed to eventually work up to a goal of 150 minutes of combined cardio and strengthening exercise per week for weight loss and overall health benefits. We discussed the following Behavioral Modification  Strategies today: increasing lean protein intake and decrease eating out.   He was informed of the importance of frequent follow up visits to maximize his success with intensive lifestyle modifications for his multiple health conditions. He was informed we would discuss his lab results at his next visit unless there is a critical issue that needs to be addressed sooner. Yair agreed to keep his next visit at the agreed upon time to discuss these results.  ALLERGIES: Allergies  Allergen Reactions   Enalapril Maleate     REACTION: cramps   Hctz [Hydrochlorothiazide]     SOB   Tribenzor [Olmesartan-Amlodipine-Hctz] Swelling    Fatigue, CP, edema   Verapamil     REACTION: Fatigue    MEDICATIONS: No current outpatient medications on file prior to visit.   No current facility-administered medications on file prior to visit.     PAST MEDICAL HISTORY: Past Medical History:  Diagnosis Date   Depression    GERD (gastroesophageal reflux disease)    Hx of adenomatous colonic polyps 09/04/2015   Hyperlipidemia    Hypertension    LBP (low back pain)    OSA (obstructive sleep apnea) 08/01/2018    PAST SURGICAL HISTORY: Past Surgical History:  Procedure Laterality Date   COLONOSCOPY  2004   hyperplastic polyps x 2   KNEE ARTHROSCOPY Right    SHOULDER ARTHROSCOPY W/ ROTATOR CUFF REPAIR     TONSILLECTOMY     vocal cord growth removed      SOCIAL HISTORY: Social History   Tobacco Use   Smoking status: Never Smoker   Smokeless tobacco: Never Used  Substance Use Topics   Alcohol use: Yes    Alcohol/week: 4.0 standard drinks    Types: 4 Cans of beer per week    Comment: 4 cans/week   Drug use: No    FAMILY HISTORY: Family History  Problem Relation Age of Onset   Breast cancer Daughter    Hypertension Father    Heart disease Father    Stroke Mother    Depression Mother    Hypertension Other    Pancreatic cancer Brother    Throat cancer  Brother    Colon cancer Neg Hx    Esophageal cancer Neg Hx    Stomach cancer Neg Hx    Rectal cancer Neg Hx    ROS: Review of Systems  Constitutional: Positive for malaise/fatigue.  HENT:       Positive for decreased hearing. Positive for ringing in the ears.  Eyes:       Positive for wearing reading glasses.  Respiratory: Positive for shortness of breath (with activity) and wheezing.   Cardiovascular: Positive for chest pain. Negative for orthopnea.       Positive for chest tightness.  Gastrointestinal: Negative for nausea and vomiting.  Musculoskeletal: Positive for joint pain.       Negative for muscle weakness.  Endo/Heme/Allergies:       Positive  for polyphagia.   PHYSICAL EXAM: Blood pressure (!) 169/98, pulse 90, temperature 98.1 F (36.7 C), temperature source Oral, height 6\' 3"  (1.905 m), weight (!) 302 lb (137 kg), SpO2 93 %. Body mass index is 37.75 kg/m. Physical Exam Vitals signs reviewed.  Constitutional:      Appearance: Normal appearance. He is well-developed. He is obese.  HENT:     Head: Normocephalic and atraumatic.     Nose: Nose normal.  Eyes:     General: No scleral icterus. Neck:     Musculoskeletal: Normal range of motion.  Cardiovascular:     Rate and Rhythm: Normal rate and regular rhythm.  Pulmonary:     Effort: Pulmonary effort is normal. No respiratory distress.  Abdominal:     Palpations: Abdomen is soft.     Tenderness: There is no abdominal tenderness.  Musculoskeletal: Normal range of motion.     Comments: Range of motion normal in all four extremities.  Skin:    General: Skin is warm and dry.  Neurological:     Mental Status: He is alert and oriented to person, place, and time.     Coordination: Coordination normal.  Psychiatric:        Mood and Affect: Mood and affect normal.        Behavior: Behavior normal.   RECENT LABS AND TESTS: BMET    Component Value Date/Time   NA 141 06/02/2018 1448   K 4.2 06/02/2018 1448     CL 104 06/02/2018 1448   CO2 29 06/02/2018 1448   GLUCOSE 80 06/02/2018 1448   BUN 11 06/02/2018 1448   CREATININE 0.98 06/02/2018 1448   CREATININE 0.87 05/05/2014 0948   CALCIUM 10.1 06/02/2018 1448   GFRNONAA >89 05/05/2014 0948   GFRAA >89 05/05/2014 0948   Lab Results  Component Value Date   HGBA1C 5.6 03/07/2018   No results found for: INSULIN CBC    Component Value Date/Time   WBC 8.0 06/02/2018 1448   RBC 4.54 06/02/2018 1448   HGB 15.3 06/02/2018 1448   HCT 44.8 06/02/2018 1448   PLT 231.0 06/02/2018 1448   MCV 98.6 06/02/2018 1448   MCH 33.0 05/05/2014 0948   MCHC 34.2 06/02/2018 1448   RDW 13.8 06/02/2018 1448   LYMPHSABS 2.3 06/02/2018 1448   MONOABS 1.0 06/02/2018 1448   EOSABS 0.1 06/02/2018 1448   BASOSABS 0.0 06/02/2018 1448   Iron/TIBC/Ferritin/ %Sat No results found for: IRON, TIBC, FERRITIN, IRONPCTSAT Lipid Panel     Component Value Date/Time   CHOL 163 03/06/2018 1043   TRIG 76.0 03/06/2018 1043   HDL 33.10 (L) 03/06/2018 1043   CHOLHDL 5 03/06/2018 1043   VLDL 15.2 03/06/2018 1043   LDLCALC 115 (H) 03/06/2018 1043   LDLDIRECT 158.5 10/19/2008 0858   Hepatic Function Panel     Component Value Date/Time   PROT 7.3 06/02/2018 1448   ALBUMIN 4.5 06/02/2018 1448   AST 32 06/02/2018 1448   ALT 52 06/02/2018 1448   ALKPHOS 54 06/02/2018 1448   BILITOT 0.6 06/02/2018 1448   BILIDIR 0.1 04/12/2010 0000      Component Value Date/Time   TSH 2.34 06/02/2018 1448   TSH 2.36 11/13/2016 1013   TSH 2.06 04/16/2016 1133   Results for MONTERROSA, Trimaine J "JOHN" (MRN [id number]) as of 06/16/2019 11:49  Ref. Range 06/02/2018 14:48  VITD Latest Ref Range: 30.00 - 100.00 ng/mL 16.50 (L)   ECG shows sinus rhythm with a rate of 98 BPM,  left axis anterior fascicular block. Voltage criteria for LVH  (R(I)+S([name]) exceeds 2.50 mV). Poor R-wave progression, may be secondary to left ventricular hypertrophy, consider old anterior infarct. ST depression seen with  left ventricular hypertrophy (strain) - consider ischemia.   INDIRECT CALORIMETER done today shows a VO2 of 279 and a REE of 1943.  His calculated basal metabolic rate is 123XX123 thus his basal metabolic rate is worse than expected.  OBESITY BEHAVIORAL INTERVENTION VISIT  Today's visit was #1  Starting weight: 302 lbs Starting date: 06/16/2019 Today's weight: 302 lbs  Today's date: 06/16/2019 Total lbs lost to date: 0 At least 15 minutes were spent on discussing the following behavioral intervention visit.    06/16/2019  Height 6\' 3"  (1.905 m)  Weight 302 lb (137 kg) (A)  BMI (Calculated) 37.75  BLOOD PRESSURE - SYSTOLIC 123XX123  BLOOD PRESSURE - DIASTOLIC 98  Waist Measurement  52 inches   Body Fat % 36.9 %  Total Body Water (lbs) 126.8 lbs  RMR 1943   ASK: We discussed the diagnosis of obesity with Iosefa J Key today and Aizen agreed to give [place] permission to discuss obesity behavioral modification therapy today.  ASSESS: Darikson has the diagnosis of obesity and his BMI today is 37.8. Wulfric is in the action stage of change.   ADVISE: Lennell was educated on the multiple health risks of obesity as well as the benefit of weight loss to improve his health. He was advised of the need for long term treatment and the importance of lifestyle modifications to improve his current health and to decrease his risk of future health problems.  AGREE: Multiple dietary modification options and treatment options were discussed and  Khian agreed to follow the recommendations documented in the above note.  ARRANGE: Nickles was educated on the importance of frequent visits to treat obesity as outlined per CMS and USPSTF guidelines and agreed to schedule his next follow up appointment today.  I, Michaelene Song, am acting as Location manager for Dennard Nip, MD  I have reviewed the above documentation for accuracy and completeness, and I agree with the above. -Dennard Nip, MD

## 2019-06-17 LAB — CBC WITH DIFFERENTIAL/PLATELET
Basophils Absolute: 0 10*3/uL (ref 0.0–0.2)
Basos: 0 %
EOS (ABSOLUTE): 0.1 10*3/uL (ref 0.0–0.4)
Eos: 1 %
Hematocrit: 47.7 % (ref 37.5–51.0)
Hemoglobin: 16.6 g/dL (ref 13.0–17.7)
Immature Grans (Abs): 0.1 10*3/uL (ref 0.0–0.1)
Immature Granulocytes: 1 %
Lymphocytes Absolute: 1.9 10*3/uL (ref 0.7–3.1)
Lymphs: 28 %
MCH: 32.6 pg (ref 26.6–33.0)
MCHC: 34.8 g/dL (ref 31.5–35.7)
MCV: 94 fL (ref 79–97)
Monocytes Absolute: 0.8 10*3/uL (ref 0.1–0.9)
Monocytes: 13 %
Neutrophils Absolute: 3.9 10*3/uL (ref 1.4–7.0)
Neutrophils: 57 %
Platelets: 204 10*3/uL (ref 150–450)
RBC: 5.09 x10E6/uL (ref 4.14–5.80)
RDW: 12.5 % (ref 11.6–15.4)
WBC: 6.7 10*3/uL (ref 3.4–10.8)

## 2019-06-17 LAB — HEMOGLOBIN A1C
Est. average glucose Bld gHb Est-mCnc: 120 mg/dL
Hgb A1c MFr Bld: 5.8 % — ABNORMAL HIGH (ref 4.8–5.6)

## 2019-06-17 LAB — INSULIN, RANDOM: INSULIN: 49 u[IU]/mL — ABNORMAL HIGH (ref 2.6–24.9)

## 2019-06-17 LAB — LIPID PANEL WITH LDL/HDL RATIO
Cholesterol, Total: 170 mg/dL (ref 100–199)
HDL: 32 mg/dL — ABNORMAL LOW (ref >39)
LDL Chol Calc (NIH): 121 mg/dL — ABNORMAL HIGH (ref 0–99)
LDL/HDL Ratio: 3.8 ratio — ABNORMAL HIGH (ref 0.0–3.6)
Triglycerides: 88 mg/dL (ref 0–149)
VLDL Cholesterol Cal: 17 mg/dL (ref 5–40)

## 2019-06-17 LAB — COMPREHENSIVE METABOLIC PANEL
ALT: 54 IU/L — ABNORMAL HIGH (ref 0–44)
AST: 30 IU/L (ref 0–40)
Albumin/Globulin Ratio: 1.4 (ref 1.2–2.2)
Albumin: 4.4 g/dL (ref 3.8–4.8)
Alkaline Phosphatase: 66 IU/L (ref 39–117)
BUN/Creatinine Ratio: 13 (ref 10–24)
BUN: 11 mg/dL (ref 8–27)
Bilirubin Total: 0.6 mg/dL (ref 0.0–1.2)
CO2: 22 mmol/L (ref 20–29)
Calcium: 9.6 mg/dL (ref 8.6–10.2)
Chloride: 97 mmol/L (ref 96–106)
Creatinine, Ser: 0.83 mg/dL (ref 0.76–1.27)
GFR calc Af Amer: 103 mL/min/{1.73_m2} (ref >59)
GFR calc non Af Amer: 89 mL/min/{1.73_m2} (ref >59)
Globulin, Total: 3.1 g/dL (ref 1.5–4.5)
Glucose: 123 mg/dL — ABNORMAL HIGH (ref 65–99)
Potassium: 4.2 mmol/L (ref 3.5–5.2)
Sodium: 138 mmol/L (ref 134–144)
Total Protein: 7.5 g/dL (ref 6.0–8.5)

## 2019-06-17 LAB — VITAMIN D 25 HYDROXY (VIT D DEFICIENCY, FRACTURES): Vit D, 25-Hydroxy: 27.2 ng/mL — ABNORMAL LOW (ref 30.0–100.0)

## 2019-06-17 LAB — TSH: TSH: 2.1 u[IU]/mL (ref 0.450–4.500)

## 2019-06-17 LAB — T4, FREE: Free T4: 1.02 ng/dL (ref 0.82–1.77)

## 2019-06-17 LAB — T3: T3, Total: 143 ng/dL (ref 71–180)

## 2019-06-25 ENCOUNTER — Other Ambulatory Visit: Payer: Self-pay

## 2019-06-25 ENCOUNTER — Ambulatory Visit (HOSPITAL_COMMUNITY): Payer: Medicare HMO | Attending: Cardiovascular Disease

## 2019-06-25 DIAGNOSIS — R9431 Abnormal electrocardiogram [ECG] [EKG]: Secondary | ICD-10-CM | POA: Diagnosis present

## 2019-06-25 DIAGNOSIS — R0602 Shortness of breath: Secondary | ICD-10-CM | POA: Diagnosis present

## 2019-06-26 DIAGNOSIS — G4733 Obstructive sleep apnea (adult) (pediatric): Secondary | ICD-10-CM | POA: Diagnosis not present

## 2019-06-30 ENCOUNTER — Ambulatory Visit (INDEPENDENT_AMBULATORY_CARE_PROVIDER_SITE_OTHER): Payer: Self-pay | Admitting: Family Medicine

## 2019-07-26 DIAGNOSIS — G4733 Obstructive sleep apnea (adult) (pediatric): Secondary | ICD-10-CM | POA: Diagnosis not present

## 2019-09-14 ENCOUNTER — Other Ambulatory Visit: Payer: Self-pay | Admitting: Physician Assistant

## 2019-09-15 DIAGNOSIS — G4733 Obstructive sleep apnea (adult) (pediatric): Secondary | ICD-10-CM | POA: Diagnosis not present

## 2019-09-28 ENCOUNTER — Ambulatory Visit (INDEPENDENT_AMBULATORY_CARE_PROVIDER_SITE_OTHER): Payer: Medicare HMO | Admitting: Physician Assistant

## 2019-09-28 ENCOUNTER — Encounter: Payer: Self-pay | Admitting: Physician Assistant

## 2019-09-28 ENCOUNTER — Other Ambulatory Visit: Payer: Self-pay

## 2019-09-28 VITALS — BP 132/82 | HR 93 | Temp 98.0°F | Resp 18 | Ht 75.0 in | Wt 303.0 lb

## 2019-09-28 DIAGNOSIS — Z23 Encounter for immunization: Secondary | ICD-10-CM

## 2019-09-28 DIAGNOSIS — Z1159 Encounter for screening for other viral diseases: Secondary | ICD-10-CM

## 2019-09-28 DIAGNOSIS — E785 Hyperlipidemia, unspecified: Secondary | ICD-10-CM | POA: Diagnosis not present

## 2019-09-28 DIAGNOSIS — N529 Male erectile dysfunction, unspecified: Secondary | ICD-10-CM | POA: Diagnosis not present

## 2019-09-28 DIAGNOSIS — I10 Essential (primary) hypertension: Secondary | ICD-10-CM | POA: Diagnosis not present

## 2019-09-28 LAB — COMPREHENSIVE METABOLIC PANEL
ALT: 53 U/L (ref 0–53)
AST: 36 U/L (ref 0–37)
Albumin: 4.1 g/dL (ref 3.5–5.2)
Alkaline Phosphatase: 53 U/L (ref 39–117)
BUN: 12 mg/dL (ref 6–23)
CO2: 23 mEq/L (ref 19–32)
Calcium: 8.9 mg/dL (ref 8.4–10.5)
Chloride: 102 mEq/L (ref 96–112)
Creatinine, Ser: 0.76 mg/dL (ref 0.40–1.50)
GFR: 101.06 mL/min (ref >60.00)
Glucose, Bld: 160 mg/dL — ABNORMAL HIGH (ref 70–99)
Potassium: 4 mEq/L (ref 3.5–5.1)
Sodium: 136 mEq/L (ref 135–145)
Total Bilirubin: 0.7 mg/dL (ref 0.2–1.2)
Total Protein: 7 g/dL (ref 6.0–8.3)

## 2019-09-28 LAB — LIPID PANEL
Cholesterol: 142 mg/dL (ref 0–200)
HDL: 23.3 mg/dL — ABNORMAL LOW
LDL Cholesterol: 101 mg/dL — ABNORMAL HIGH (ref 0–99)
NonHDL: 119
Total CHOL/HDL Ratio: 6
Triglycerides: 90 mg/dL (ref 0.0–149.0)
VLDL: 18 mg/dL (ref 0.0–40.0)

## 2019-09-28 NOTE — Patient Instructions (Addendum)
Please go to the lab today for blood work.  I will call you with your results. We will alter treatment regimen(s) if indicated by your results.   You will be contacted by Urology for assessment for ED.  Hang in there!

## 2019-09-28 NOTE — Progress Notes (Signed)
Patient presents to clinic today to follow-up regarding chronic medical issues.  Patient with history of hypertension and hyperlipidemia with OSA.  Patient is currently on a regimen of benazepril 40 mg daily.  Not currently on statin therapy.  Endorses compliance with CPAP nightly (followed by pulmonology).  Patient endorses taking medications as directed and tolerating well.  Is trying to watch his diet.  Does stay active at home but no current regular exercise regimen. Patient denies chest pain, palpitations, lightheadedness, dizziness, vision changes or frequent headaches.  BP Readings from Last 3 Encounters:  09/28/19 132/82  06/16/19 (!) 169/98  11/10/18 116/74   Patient is due for repeat colonoscopy due to history of edematous colon polyps.  Is scheduled for October 19, 2019.  Patient also due for seasonal flu shot.  Agrees to have this today.  Patient also requesting referral to urology due to history of erectile dysfunction.  Would like to discuss the possibility of a penile implant.  Past Medical History:  Diagnosis Date  . Depression   . GERD (gastroesophageal reflux disease)   . Hx of adenomatous colonic polyps 09/04/2015  . Hyperlipidemia   . Hypertension   . LBP (low back pain)   . OSA (obstructive sleep apnea) 08/01/2018    Current Outpatient Medications on File Prior to Visit  Medication Sig Dispense Refill  . benazepril (LOTENSIN) 20 MG tablet Take 2 tablets (40 mg total) by mouth daily. Please schedule your physical with Einar Pheasant. [phone] 60 tablet 0  . pantoprazole (PROTONIX) 40 MG tablet Take 1 tablet (40 mg total) by mouth daily. 90 tablet 0   No current facility-administered medications on file prior to visit.    Allergies  Allergen Reactions  . Enalapril Maleate     REACTION: cramps  . Hctz [Hydrochlorothiazide]     SOB  . Tribenzor [Olmesartan-Amlodipine-Hctz] Swelling    Fatigue, CP, edema  . Verapamil     REACTION: Fatigue    Family History   Problem Relation Age of Onset  . Breast cancer Daughter   . Hypertension Father   . Heart disease Father   . Stroke Mother   . Depression Mother   . Hypertension Other   . Pancreatic cancer Brother   . Throat cancer Brother   . Colon cancer Neg Hx   . Esophageal cancer Neg Hx   . Stomach cancer Neg Hx   . Rectal cancer Neg Hx     Social History   Socioeconomic History  . Marital status: Divorced    Spouse name: Not on file  . Number of children: Not on file  . Years of education: Not on file  . Highest education level: Not on file  Occupational History  . Occupation: Retired  Tobacco Use  . Smoking status: Never Smoker  . Smokeless tobacco: Never Used  Substance and Sexual Activity  . Alcohol use: Yes    Alcohol/week: 4.0 standard drinks    Types: 4 Cans of beer per week    Comment: 4 cans/week  . Drug use: No  . Sexual activity: Not Currently  Other Topics Concern  . Not on file  Social History Narrative   Regular exercise- yes, seldom; working physically all the time.   Social Determinants of Health   Financial Resource Strain:   . Difficulty of Paying Living Expenses: Not on file  Food Insecurity:   . Worried About Charity fundraiser in the Last Year: Not on file  . Ran Out of Food  in the Last Year: Not on file  Transportation Needs:   . Lack of Transportation (Medical): Not on file  . Lack of Transportation (Non-Medical): Not on file  Physical Activity:   . Days of Exercise per Week: Not on file  . Minutes of Exercise per Session: Not on file  Stress:   . Feeling of Stress : Not on file  Social Connections:   . Frequency of Communication with Friends and Family: Not on file  . Frequency of Social Gatherings with Friends and Family: Not on file  . Attends Religious Services: Not on file  . Active Member of Clubs or Organizations: Not on file  . Attends Archivist Meetings: Not on file  . Marital Status: Not on file   Review of Systems -  See HPI.  All other ROS are negative.  BP 132/82   Pulse 93   Temp 98 F (36.7 C)   Resp 18   Ht 6' 3"  (1.905 m)   Wt (!) 303 lb (137.4 kg)   SpO2 97%   BMI 37.87 kg/m   Physical Exam Vitals reviewed.  Constitutional:      Appearance: Normal appearance.  HENT:     Head: Normocephalic and atraumatic.  Eyes:     Conjunctiva/sclera: Conjunctivae normal.  Cardiovascular:     Rate and Rhythm: Normal rate and regular rhythm.     Pulses: Normal pulses.     Heart sounds: Normal heart sounds.  Pulmonary:     Effort: Pulmonary effort is normal.     Breath sounds: Normal breath sounds.  Musculoskeletal:     Cervical back: Neck supple.  Neurological:     General: No focal deficit present.     Mental Status: He is alert. Mental status is at baseline.  Psychiatric:        Mood and Affect: Mood normal.    Assessment/Plan: 1. Essential hypertension BP stable.  Asymptomatic.  Continue current regimen.  Repeat labs today.  2. Hyperlipidemia, unspecified hyperlipidemia type Dietary and exercise recommendations reviewed.  Will obtain fasting lipid panel and CMP today - Comp Met (CMET) - Lipid Profile  3. Erectile dysfunction, unspecified erectile dysfunction type Patient interested in prosthetic.  Referral to urology placed. - Ambulatory referral to Urology  4. Need for hepatitis C screening test Patient at increased risk due to birth year.  Hep C antibody ordered per CDC guidelines. - Hepatitis C Antibody  5. Need for immunization against influenza - Flu Vaccine QUAD High Dose(Fluad)  This visit occurred during the SARS-CoV-2 public health emergency.  Safety protocols were in place, including screening questions prior to the visit, additional usage of staff PPE, and extensive cleaning of exam room while observing appropriate contact time as indicated for disinfecting solutions.    [name], PA-C

## 2019-10-05 ENCOUNTER — Other Ambulatory Visit: Payer: Self-pay

## 2019-10-05 ENCOUNTER — Ambulatory Visit (AMBULATORY_SURGERY_CENTER): Payer: Medicare HMO | Admitting: *Deleted

## 2019-10-05 VITALS — Temp 97.1°F | Ht 75.0 in | Wt 305.0 lb

## 2019-10-05 DIAGNOSIS — Z1159 Encounter for screening for other viral diseases: Secondary | ICD-10-CM

## 2019-10-05 DIAGNOSIS — Z86010 Personal history of colonic polyps: Secondary | ICD-10-CM

## 2019-10-05 NOTE — Progress Notes (Signed)

## 2019-10-12 ENCOUNTER — Encounter: Payer: Self-pay | Admitting: Internal Medicine

## 2019-10-14 ENCOUNTER — Other Ambulatory Visit: Payer: Self-pay | Admitting: Internal Medicine

## 2019-10-14 ENCOUNTER — Ambulatory Visit (INDEPENDENT_AMBULATORY_CARE_PROVIDER_SITE_OTHER): Payer: Medicare HMO

## 2019-10-14 DIAGNOSIS — Z1159 Encounter for screening for other viral diseases: Secondary | ICD-10-CM

## 2019-10-14 LAB — SARS CORONAVIRUS 2 (TAT 6-24 HRS): SARS Coronavirus 2: NEGATIVE

## 2019-10-19 ENCOUNTER — Ambulatory Visit (AMBULATORY_SURGERY_CENTER): Payer: Medicare HMO | Admitting: Internal Medicine

## 2019-10-19 ENCOUNTER — Other Ambulatory Visit: Payer: Self-pay

## 2019-10-19 ENCOUNTER — Encounter: Payer: Self-pay | Admitting: Internal Medicine

## 2019-10-19 VITALS — BP 146/76 | HR 90 | Temp 98.5°F | Resp 16 | Ht 75.0 in | Wt 305.0 lb

## 2019-10-19 DIAGNOSIS — D120 Benign neoplasm of cecum: Secondary | ICD-10-CM | POA: Diagnosis not present

## 2019-10-19 DIAGNOSIS — Z86010 Personal history of colonic polyps: Secondary | ICD-10-CM | POA: Diagnosis not present

## 2019-10-19 DIAGNOSIS — D124 Benign neoplasm of descending colon: Secondary | ICD-10-CM | POA: Diagnosis not present

## 2019-10-19 DIAGNOSIS — D123 Benign neoplasm of transverse colon: Secondary | ICD-10-CM | POA: Diagnosis not present

## 2019-10-19 DIAGNOSIS — D122 Benign neoplasm of ascending colon: Secondary | ICD-10-CM

## 2019-10-19 DIAGNOSIS — D129 Benign neoplasm of anus and anal canal: Secondary | ICD-10-CM

## 2019-10-19 DIAGNOSIS — D128 Benign neoplasm of rectum: Secondary | ICD-10-CM

## 2019-10-19 MED ORDER — SODIUM CHLORIDE 0.9 % IV SOLN: 500.0000 mL | Freq: Once | INTRAVENOUS | Status: DC

## 2019-10-19 NOTE — Progress Notes (Signed)
Called to room to assist during endoscopic procedure.  Patient ID and intended procedure confirmed with present staff. Received instructions for my participation in the procedure from the performing physician.  

## 2019-10-19 NOTE — Op Note (Signed)
[facility] Patient Name: Hector Edwards Procedure Date: 10/19/2019 10:22 AM MRN: [id number] Endoscopist: Gatha Mayer , MD Age: 74 Referring MD:  Date of Birth: [birth year] Gender: Male Account #: [id number] Procedure:                Colonoscopy Indications:              Surveillance: Personal history of adenomatous                            polyps on last colonoscopy > 3 years ago Medicines:                Propofol per Anesthesia, Monitored Anesthesia Care Procedure:                Pre-Anesthesia Assessment:                           - Prior to the procedure, a History and Physical                            was performed, and patient medications and                            allergies were reviewed. The patient's tolerance of                            previous anesthesia was also reviewed. The risks                            and benefits of the procedure and the sedation                            options and risks were discussed with the patient.                            All questions were answered, and informed consent                            was obtained. Prior Anticoagulants: The patient has                            taken no previous anticoagulant or antiplatelet                            agents. ASA Grade Assessment: II - A patient with                            mild systemic disease. After reviewing the risks                            and benefits, the patient was deemed in                            satisfactory condition to undergo the procedure.  After obtaining informed consent, the colonoscope                            was passed under direct vision. Throughout the                            procedure, the patient's blood pressure, pulse, and                            oxygen saturations were monitored continuously. The                            Colonoscope was introduced through the anus and   advanced to the the cecum, identified by                            appendiceal orifice and ileocecal valve. The                            colonoscopy was performed without difficulty. The                            patient tolerated the procedure fairly well. The                            quality of the bowel preparation was good. The                            bowel preparation used was Miralax via split dose                            instruction. The ileocecal valve, appendiceal                            orifice, and rectum were photographed. Scope In: 10:45:50 AM Scope Out: 11:08:31 AM Scope Withdrawal Time: 0 hours 17 minutes 19 seconds  Total Procedure Duration: 0 hours 22 minutes 41 seconds  Findings:                 The perianal and digital rectal examinations were                            normal. Pertinent negatives include normal prostate                            (size, shape, and consistency).                           Nine sessile polyps were found in the sigmoid                            colon, descending colon, transverse colon,                            ascending colon and cecum. The polyps were  diminutive in size. These polyps were removed with                            a cold snare. Resection and retrieval were                            complete. Verification of patient identification                            for the specimen was done. Estimated blood loss was                            minimal.                           Multiple diverticula were found in the sigmoid                            colon.                           The exam was otherwise without abnormality on                            direct and retroflexion views. Complications:            No immediate complications. Estimated Blood Loss:     Estimated blood loss was minimal. Impression:               - Nine diminutive polyps in the sigmoid colon, in                             the descending colon, in the transverse colon, in                            the ascending colon and in the cecum, removed with                            a cold snare. Resected and retrieved.                           - Diverticulosis in the sigmoid colon.                           - The examination was otherwise normal on direct                            and retroflexion views. Did also see internal                            hemorrhoids                           - Personal history of colonic polyps. 3 diminutive  adenomas removed 08/2015 Recommendation:           - Patient has a contact number available for                            emergencies. The signs and symptoms of potential                            delayed complications were discussed with the                            patient. Return to normal activities tomorrow.                            Written discharge instructions were provided to the                            patient.                           - Resume previous diet.                           - Continue present medications.                           - Repeat colonoscopy is recommended for                            surveillance. The colonoscopy date will be                            determined after pathology results from today's                            exam become available for review. Gatha Mayer, MD 10/19/2019 11:22:33 AM This report has been signed electronically.

## 2019-10-19 NOTE — Progress Notes (Signed)
Pt's states no medical or surgical changes since previsit or office visit. 

## 2019-10-19 NOTE — Progress Notes (Signed)
PT taken to PACU. Monitors in place. VSS. Report given to RN. 

## 2019-10-19 NOTE — Patient Instructions (Addendum)
I found and removed 9 tiny polyps. All look benign -they will be analyzed  I will let you know pathology results and when to have another routine colonoscopy by mail and/or My Chart.  I appreciate the opportunity to care for you. Gatha Mayer, MD, [facility]   Handout on polyps & diverticulosis & hemorrhoids given to you today  Await pathology results on polyps removed   YOU HAD AN ENDOSCOPIC PROCEDURE TODAY AT [facility]:   Refer to the procedure report that was given to you for any specific questions about what was found during the examination.  If the procedure report does not answer your questions, please call your gastroenterologist to clarify.  If you requested that your care partner not be given the details of your procedure findings, then the procedure report has been included in a sealed envelope for you to review at your convenience later.  YOU SHOULD EXPECT: Some feelings of bloating in the abdomen. Passage of more gas than usual.  Walking can help get rid of the air that was put into your GI tract during the procedure and reduce the bloating. If you had a lower endoscopy (such as a colonoscopy or flexible sigmoidoscopy) you may notice spotting of blood in your stool or on the toilet paper. If you underwent a bowel prep for your procedure, you may not have a normal bowel movement for a few days.  Please Note:  You might notice some irritation and congestion in your nose or some drainage.  This is from the oxygen used during your procedure.  There is no need for concern and it should clear up in a day or so.  SYMPTOMS TO REPORT IMMEDIATELY:   Following lower endoscopy (colonoscopy or flexible sigmoidoscopy):  Excessive amounts of blood in the stool  Significant tenderness or worsening of abdominal pains  Swelling of the abdomen that is new, acute  Fever of 100F or higher    For urgent or emergent issues, a gastroenterologist can be reached at any hour by  calling [phone].   DIET:  We do recommend a small meal at first, but then you may proceed to your regular diet.  Drink plenty of fluids but you should avoid alcoholic beverages for 24 hours.  ACTIVITY:  You should plan to take it easy for the rest of today and you should NOT DRIVE or use heavy machinery until tomorrow (because of the sedation medicines used during the test).    FOLLOW UP: Our staff will call the number listed on your records 48-72 hours following your procedure to check on you and address any questions or concerns that you may have regarding the information given to you following your procedure. If we do not reach you, we will leave a message.  We will attempt to reach you two times.  During this call, we will ask if you have developed any symptoms of COVID 19. If you develop any symptoms (ie: fever, flu-like symptoms, shortness of breath, cough etc.) before then, please call [phone].  If you test positive for Covid 19 in the 2 weeks post procedure, please call and report this information to [place].    If any biopsies were taken you will be contacted by phone or by letter within the next 1-3 weeks.  Please call us at [phone] if you have not heard about the biopsies in 3 weeks.    SIGNATURES/CONFIDENTIALITY: You and/or your care partner have signed paperwork which will be entered  into your electronic medical record.  These signatures attest to the fact that that the information above on your After Visit Summary has been reviewed and is understood.  Full responsibility of the confidentiality of this discharge information lies with you and/or your care-partner.

## 2019-10-21 ENCOUNTER — Telehealth: Payer: Self-pay

## 2019-10-21 ENCOUNTER — Telehealth: Payer: Self-pay | Admitting: *Deleted

## 2019-10-21 NOTE — Telephone Encounter (Signed)
  Follow up Call-  Call back number 10/19/2019  Post procedure Call Back phone  # [phone]  Permission to leave phone message Yes  Some recent data might be hidden     Patient questions:  Do you have a fever, pain , or abdominal swelling? No. Pain Score  0 *  Have you tolerated food without any problems? Yes.    Have you been able to return to your normal activities? Yes.    Do you have any questions about your discharge instructions: Diet   No. Medications  No. Follow up visit  No.  Do you have questions or concerns about your Care? No.  Actions: * If pain score is 4 or above: No action needed, pain <4.  1. Have you developed a fever since your procedure? no  2.   Have you had an respiratory symptoms (SOB or cough) since your procedure? no  3.   Have you tested positive for COVID 19 since your procedure no  4.   Have you had any family members/close contacts diagnosed with the COVID 19 since your procedure?  no   If yes to any of these questions please route to Joylene John, RN and Alphonsa Gin, Therapist, sports.

## 2019-10-21 NOTE — Telephone Encounter (Signed)
Follow up call made, left message. 

## 2019-10-29 ENCOUNTER — Encounter: Payer: Self-pay | Admitting: Internal Medicine

## 2019-10-29 DIAGNOSIS — N5201 Erectile dysfunction due to arterial insufficiency: Secondary | ICD-10-CM | POA: Diagnosis not present

## 2019-10-29 DIAGNOSIS — Z86010 Personal history of colonic polyps: Secondary | ICD-10-CM

## 2019-10-29 DIAGNOSIS — Z125 Encounter for screening for malignant neoplasm of prostate: Secondary | ICD-10-CM | POA: Diagnosis not present

## 2019-10-29 DIAGNOSIS — N402 Nodular prostate without lower urinary tract symptoms: Secondary | ICD-10-CM | POA: Diagnosis not present

## 2019-10-29 DIAGNOSIS — Z8042 Family history of malignant neoplasm of prostate: Secondary | ICD-10-CM | POA: Diagnosis not present

## 2019-11-20 DIAGNOSIS — N390 Urinary tract infection, site not specified: Secondary | ICD-10-CM | POA: Diagnosis not present

## 2019-11-20 DIAGNOSIS — N5201 Erectile dysfunction due to arterial insufficiency: Secondary | ICD-10-CM | POA: Diagnosis not present

## 2019-11-22 ENCOUNTER — Other Ambulatory Visit: Payer: Self-pay | Admitting: Physician Assistant

## 2019-12-23 ENCOUNTER — Other Ambulatory Visit: Payer: Self-pay | Admitting: Urology

## 2020-01-04 ENCOUNTER — Telehealth: Payer: Self-pay | Admitting: Physician Assistant

## 2020-01-04 NOTE — Progress Notes (Signed)
  Chronic Care Management   Note  01/04/2020 Name: Hector Edwards MRN: [id number] DOB: [birth year]  [name] is a 74 y.o. year old male who is a primary care patient of Delorse Limber. I reached out to [employer] by phone today in response to a referral sent by [name] PCP, Brunetta Jeans, PA-C.   [name] was given information about Chronic Care Management services today including:  1. CCM service includes personalized support from designated clinical staff supervised by his physician, including individualized plan of care and coordination with other care providers 2. 24/7 contact phone numbers for assistance for urgent and routine care needs. 3. Service will only be billed when office clinical staff spend 20 minutes or more in a month to coordinate care. 4. Only one practitioner may furnish and bill the service in a calendar month. 5. The patient may stop CCM services at any time (effective at the end of the month) by phone call to the office staff.   Patient agreed to services and verbal consent obtained.   Follow up plan:   Earney [facility]

## 2020-01-05 ENCOUNTER — Ambulatory Visit: Payer: Medicare HMO

## 2020-01-05 ENCOUNTER — Telehealth: Payer: Medicare HMO

## 2020-01-05 DIAGNOSIS — E785 Hyperlipidemia, unspecified: Secondary | ICD-10-CM

## 2020-01-05 DIAGNOSIS — I10 Essential (primary) hypertension: Secondary | ICD-10-CM

## 2020-01-05 NOTE — Progress Notes (Signed)
Chronic Care Management Pharmacy  Name: Hector Edwards  MRN: [id number] DOB: 1945-12-20  Chief Complaint/ HPI  Hector Edwards,  74 y.o. , male presents for their Initial CCM visit with the clinical pharmacist via telephone due to COVID-19 Pandemic.  PCP : Hector Jeans, PA-C  Their chronic conditions include: HTN, HLD, GERD  Office Visits: -12/21 (PCP): HTN, HLD follow-up; amb referral to urology for ED  Consult Visits: -9/8 (Dr. Leafy Edwards, [facility] weight management): Hector Edwards states his desired weight loss is 52 lbs  Medications: Outpatient Encounter Medications as of 01/05/2020  Medication Sig  . benazepril (LOTENSIN) 20 MG tablet Take 2 tablets (40 mg total) by mouth daily.  . chlorhexidine (PERIDEX) 0.12 % solution 15 mLs 2 (two) times daily.  . pantoprazole (PROTONIX) 40 MG tablet Take 1 tablet (40 mg total) by mouth daily.  Marland Kitchen amoxicillin (AMOXIL) 500 MG tablet Take 500 mg by mouth 3 (three) times daily.  . metroNIDAZOLE (FLAGYL) 500 MG tablet Take 500 mg by mouth 3 (three) times daily.  . Vitamin D, Ergocalciferol, (DRISDOL) 1.25 MG (50000 UT) CAPS capsule Take by mouth.   No facility-administered encounter medications on file as of 01/05/2020.   Current Diagnosis/Assessment:  Goals Addressed            This Visit's Progress   . Patient Stated       CARE PLAN ENTRY  Current Barriers:  . Chronic Disease Management support, education, and care coordination needs related to HTN and HLD Pharmacist Clinical Goal(s):  . BP <140/90 . LDL <100 . Lose 7 lbs by 04/06/2020  Interventions: . Comprehensive medication review performed. . We agreed on weight loss goal of at least 7 pounds over the next three months. Will work to get to a 1.5 mile in 30-40 minutes, start cooking dinner at home 3x/week, substituting vegetable oil in place of bacon grease or butter.   Patient Self Care Activities:  . Patient verbalizes understanding of plan to call pharmacist, Hector Edwards, with  any questions.  Initial goal documentation        Hypertension / Weight loss   Office blood pressures are  BP Readings from Last 3 Encounters:  10/19/19 (!) 146/76  09/28/19 132/82  06/16/19 (!) 169/98   Most recent wt reported from OV is 305 lb 10/19/2019  Currently taking benazepril 20mg  two tablets daily. Denies chest pain or dizziness. BP slightly above goal of <140/90. Patient checks BP at home infrequently. We discussed diet and exercise extensively.   Currently visits [facility] 3-4 times/week, walking a 20 minute mile each time. We agreed on weight loss goal of at least 7 pounds over the next three months. Will work to get to a 1.5 mile in 30-40 minutes, eat out less and start cooking at home 3x/wk, substituting vegt. oil in place of bacon grease or butter.   Expresses interest in trying prescription for weight loss, such as phentermine. Will discuss with PCP.  Plan Continue current medications. I will follow-up with PCP to see if weight loss prescription could be appropriate.    Hyperlipidemia   Lipid Panel     Component Value Date/Time   CHOL 142 09/28/2019 1035   CHOL 170 06/16/2019 1035   TRIG 90.0 09/28/2019 1035   HDL 23.30 (L) 09/28/2019 1035   HDL 32 (L) 06/16/2019 1035   CHOLHDL 6 09/28/2019 1035   VLDL 18.0 09/28/2019 1035   LDLCALC 101 (H) 09/28/2019 1035   LDLCALC 121 (H) 06/16/2019 1035  LDLDIRECT 158.5 10/19/2008 0858   LABVLDL 17 06/16/2019 1035    The 10-year ASCVD risk score Hector Edwards DC Jr., et al., 2013) is: 31.5%   Values used to calculate the score:     Age: 63 years     Sex: Male     Is Non-Hispanic African American: No     Diabetic: No     Tobacco smoker: No     Systolic Blood Pressure: 123456 mmHg     Is BP treated: Yes     HDL Cholesterol: 23.3 mg/dL     Total Cholesterol: 142 mg/dL   Patient has taken atorvastatin and pravastatin previously. Currently is not any lipid-lowering agents.  Is slightly above LDL goal of <100, total cholesterol  at goal of <200. Note diet assessment/plan above.  We discussed:  diet and exercise extensively  Plan Continue control with diet and exercise  Medication Management  Occasionally will miss dose of BP or GERD medication due to forgetting. Would like to use [facility] services.    Plan Utilize [facility] for medication synchronization, packaging and delivery. __________________ Visit Information [name] was given information about Chronic Care Management services today including:  1. CCM service includes personalized support from designated clinical staff supervised by his physician, including individualized plan of care and coordination with other care providers 2. 24/7 contact phone numbers for assistance for urgent and routine care needs. 3. Standard insurance, coinsurance, copays and deductibles apply for chronic care management only during months in which we provide at least 20 minutes of these services. Most insurances cover these services at 100%, however patients may be responsible for any copay, coinsurance and/or deductible if applicable. This service may help you avoid the need for more expensive face-to-face services. 4. Only one practitioner may furnish and bill the service in a calendar month. 5. The patient may stop CCM services at any time (effective at the end of the month) by phone call to the office staff.  Patient agreed to services and verbal consent obtained.   The patient verbalized understanding of instructions provided today and agreed to receive a mailed copy of patient instruction and/or educational materials. Telephone follow up appointment with pharmacy team member scheduled for: 6/30 2pm  Verbal consent obtained for [facility] enhanced pharmacy services (medication synchronization, adherence packaging, delivery coordination). A medication sync plan was created to allow patient to get all medications delivered once every 30 to 90 days per  patient preference. Patient understands they have freedom to choose pharmacy and clinical pharmacist will coordinate care between all prescribers and [facility].  Madelin Rear, Pharm.D. Clinical Pharmacist [facility] [phone]

## 2020-01-05 NOTE — Patient Instructions (Addendum)
Exercising to Lose Weight Exercise is structured, repetitive physical activity to improve fitness and health. Getting regular exercise is important for everyone. It is especially important if you are overweight. Being overweight increases your risk of heart disease, stroke, diabetes, high blood pressure, and several types of cancer. Reducing your calorie intake and exercising can help you lose weight. Exercise is usually categorized as moderate or vigorous intensity. To lose weight, most people need to do a certain amount of moderate-intensity or vigorous-intensity exercise each week. Moderate-intensity exercise  Moderate-intensity exercise is any activity that gets you moving enough to burn at least three times more energy (calories) than if you were sitting. Examples of moderate exercise include:  Walking a mile in 15 minutes.  Doing light yard work.  Biking at an easy pace. Most people should get at least 150 minutes (2 hours and 30 minutes) a week of moderate-intensity exercise to maintain their body weight. Vigorous-intensity exercise Vigorous-intensity exercise is any activity that gets you moving enough to burn at least six times more calories than if you were sitting. When you exercise at this intensity, you should be working hard enough that you are not able to carry on a conversation. Examples of vigorous exercise include:  Running.  Playing a team sport, such as football, basketball, and soccer.  Jumping rope. Most people should get at least 75 minutes (1 hour and 15 minutes) a week of vigorous-intensity exercise to maintain their body weight. How can exercise affect me? When you exercise enough to burn more calories than you eat, you lose weight. Exercise also reduces body fat and builds muscle. The more muscle you have, the more calories you burn. Exercise also:  Improves mood.  Reduces stress and tension.  Improves your overall fitness, flexibility, and  endurance.  Increases bone strength. The amount of exercise you need to lose weight depends on:  Your age.  The type of exercise.  Any health conditions you have.  Your overall physical ability. Talk to your health care provider about how much exercise you need and what types of activities are safe for you. What actions can I take to lose weight? Nutrition   Make changes to your diet as told by your health care provider or diet and nutrition specialist (dietitian). This may include: ? Eating fewer calories. ? Eating more protein. ? Eating less unhealthy fats. ? Eating a diet that includes fresh fruits and vegetables, whole grains, low-fat dairy products, and lean protein. ? Avoiding foods with added fat, salt, and sugar.  Drink plenty of water while you exercise to prevent dehydration or heat stroke. Activity  Choose an activity that you enjoy and set realistic goals. Your health care provider can help you make an exercise plan that works for you.  Exercise at a moderate or vigorous intensity most days of the week. ? The intensity of exercise may vary from person to person. You can tell how intense a workout is for you by paying attention to your breathing and heartbeat. Most people will notice their breathing and heartbeat get faster with more intense exercise.  Do resistance training twice each week, such as: ? Push-ups. ? Sit-ups. ? Lifting weights. ? Using resistance bands.  Getting short amounts of exercise can be just as helpful as long structured periods of exercise. If you have trouble finding time to exercise, try to include exercise in your daily routine. ? Get up, stretch, and walk around every 30 minutes throughout the day. ? Go for a   walk during your lunch break. ? Park your car farther away from your destination. ? If you take public transportation, get off one stop early and walk the rest of the way. ? Make phone calls while standing up and walking  around. ? Take the stairs instead of elevators or escalators.  Wear comfortable clothes and shoes with good support.  Do not exercise so much that you hurt yourself, feel dizzy, or get very short of breath. Where to find more information  U.S. Department of Health and Human Services: [employer]at  Centers for Disease Control and Prevention (CDC): http://www.wolf.info/ Contact a health care provider:  Before starting a new exercise program.  If you have questions or concerns about your weight.  If you have a medical problem that keeps you from exercising. Get help right away if you have any of the following while exercising:  Injury.  Dizziness.  Difficulty breathing or shortness of breath that does not go away when you stop exercising.  Chest pain.  Rapid heartbeat. Summary  Being overweight increases your risk of heart disease, stroke, diabetes, high blood pressure, and several types of cancer.  Losing weight happens when you burn more calories than you eat.  Reducing the amount of calories you eat in addition to getting regular moderate or vigorous exercise each week helps you lose weight. This information is not intended to replace advice given to you by your health care provider. Make sure you discuss any questions you have with your health care provider. Document Revised: 10/07/2017 Document Reviewed: 10/07/2017 Elsevier Patient Education  2020 [employer].  Visit Information  Goals Addressed            This Visit's Progress   . Patient Stated       CARE PLAN ENTRY  Current Barriers:  . Chronic Disease Management support, education, and care coordination needs related to HTN and HLD Pharmacist Clinical Goal(s):  . BP <140/90 . LDL <100 . Lose 7 lbs by 04/06/2020  Interventions: . Comprehensive medication review performed. . We agreed on weight loss goal of at least 7 pounds over the next three months. Will work to get to a 1.5 mile in 30-40 minutes, start  cooking dinner at home 3x/week, substituting vegetable oil in place of bacon grease or butter.   Patient Self Care Activities:  . Patient verbalizes understanding of plan to call pharmacist, [name], with any questions.  Initial goal documentation       Mr. Gilhooley was given information about Chronic Care Management services today including:  1. CCM service includes personalized support from designated clinical staff supervised by his physician, including individualized plan of care and coordination with other care providers 2. 24/7 contact phone numbers for assistance for urgent and routine care needs. 3. Standard insurance, coinsurance, copays and deductibles apply for chronic care management only during months in which we provide at least 20 minutes of these services. Most insurances cover these services at 100%, however patients may be responsible for any copay, coinsurance and/or deductible if applicable. This service may help you avoid the need for more expensive face-to-face services. 4. Only one practitioner may furnish and bill the service in a calendar month. 5. The patient may stop CCM services at any time (effective at the end of the month) by phone call to the office staff.  Patient agreed to services and verbal consent obtained.   The patient verbalized understanding of instructions provided today and agreed to receive a mailed copy of patient instruction  and/or Scientist, clinical (histocompatibility and immunogenetics). Telephone follow up appointment with pharmacy team member scheduled for: 6/30 2pm  Madelin Rear, Pharm.D. Clinical Pharmacist [facility] [phone]

## 2020-01-06 ENCOUNTER — Telehealth: Payer: Self-pay | Admitting: Emergency Medicine

## 2020-01-06 DIAGNOSIS — I10 Essential (primary) hypertension: Secondary | ICD-10-CM

## 2020-01-06 DIAGNOSIS — K219 Gastro-esophageal reflux disease without esophagitis: Secondary | ICD-10-CM

## 2020-01-06 MED ORDER — PANTOPRAZOLE SODIUM 40 MG PO TBEC: 40.0000 mg | DELAYED_RELEASE_TABLET | Freq: Every day | ORAL | 1 refills | Status: DC

## 2020-01-06 MED ORDER — BENAZEPRIL HCL 20 MG PO TABS: 40.0000 mg | ORAL_TABLET | Freq: Every day | ORAL | 1 refills | Status: DC

## 2020-01-06 NOTE — Telephone Encounter (Signed)
-----   Message from Madelin Rear, [facility] sent at 01/05/2020  3:28 PM EDT ----- Verlene Mayer! Could you send Hector Edwards maintenance meds to new preferred pharmacy, [facility], when you get a chance please? Please let me know if you have any questions.  Thanks! [name]

## 2020-01-12 ENCOUNTER — Other Ambulatory Visit: Payer: Self-pay | Admitting: General Practice

## 2020-01-12 DIAGNOSIS — E785 Hyperlipidemia, unspecified: Secondary | ICD-10-CM

## 2020-01-12 DIAGNOSIS — I10 Essential (primary) hypertension: Secondary | ICD-10-CM

## 2020-01-12 DIAGNOSIS — R69 Illness, unspecified: Secondary | ICD-10-CM | POA: Diagnosis not present

## 2020-01-15 ENCOUNTER — Other Ambulatory Visit: Payer: Self-pay

## 2020-01-15 ENCOUNTER — Encounter (HOSPITAL_BASED_OUTPATIENT_CLINIC_OR_DEPARTMENT_OTHER): Payer: Self-pay | Admitting: Urology

## 2020-01-15 NOTE — Progress Notes (Addendum)
Spoke with Janett Billow zanetto pa ok to proceed. Ok to use 06-16-2019 ekg for 01-25-2020 surgery per Janett Billow zanetto pa.  Spoke w/ via phone for pre-op interview---patient Lab needs dos---- I stat 8             COVID test ------01-21-2020 1000 am Arrive at -------530 am 01-25-2020 NPO after ------midnight Medications to take morning of surgery -----none Diabetic medication -----n/a Patient Special Instructions -----bring cpap mask tubing and machine Pre-Op special Istructions -----patient given overnight stay instructions including bring all prescription medication in original containers Patient verbalized understanding of instructions that were given at this phone interview. Patient denies shortness of breath, chest pain, fever, cough a this phone interview.  Anesthesia : abnormal ekg 06-16-2019, last cardiac follow up 2017  PCP: dr Raiford Noble , saw dr caren Leafy Ro 06-16-2019 epic Cardiologist :lov dr Irish Lack 05-03-2016 note said to follow up  In 1 year Chest x-ray :none EKG : 06-16-2019 epic Echo :06-25-2019 epic Stress test 04-30-2016 Cardiac Cath : none Sleep Study/ CPAP :cpap auto set 5 to 15 Home sleep study 07-31-2018 lov pulmonary tonya nichols np 02-09-2019 epic says follow up in 2 months Fasting Blood Sugar :      / Checks Blood Sugar -- times a day:  n/a Blood Thinner/ Instructions /Last Dose:n/a ASA / Instructions/ Last Dose : n/a  Patient denies shortness of breath, chest pain, fever, and cough at this phone interview.

## 2020-01-21 ENCOUNTER — Other Ambulatory Visit (HOSPITAL_COMMUNITY)
Admission: RE | Admit: 2020-01-21 | Discharge: 2020-01-21 | Disposition: A | Discharge status: Home / Self Care | Payer: Medicare HMO | Source: Ambulatory Visit | Attending: Urology | Admitting: Urology

## 2020-01-21 DIAGNOSIS — Z01812 Encounter for preprocedural laboratory examination: Secondary | ICD-10-CM | POA: Diagnosis not present

## 2020-01-21 DIAGNOSIS — Z20822 Contact with and (suspected) exposure to covid-19: Secondary | ICD-10-CM | POA: Diagnosis not present

## 2020-01-21 LAB — SARS CORONAVIRUS 2 (TAT 6-24 HRS): SARS Coronavirus 2: NEGATIVE

## 2020-01-23 NOTE — Anesthesia Preprocedure Evaluation (Addendum)
Anesthesia Evaluation  Patient identified by MRN, date of birth, ID band Patient awake    Reviewed: Allergy & Precautions, H&P , NPO status , Patient's Chart, lab work & pertinent test results  Airway Mallampati: [name]  TM Distance: >3 FB Neck ROM: Full    Dental no notable dental hx. (+) Teeth Intact, Dental Advisory Given   Pulmonary neg pulmonary ROS, sleep apnea and Continuous Positive Airway Pressure Ventilation ,    Pulmonary exam normal breath sounds clear to auscultation       Cardiovascular Exercise Tolerance: Good hypertension, Pt. on medications negative cardio ROS Normal cardiovascular exam Rhythm:Regular Rate:Normal  Echo 9/20 Left ventricular ejection fraction, by visual estimation,  is 55 to 60%. The left ventricle has normal function. Left ventricular  septal wall thickness was normal. Moderately increased left ventricular  posterior wall thickness. There is  no left ventricular hypertrophy. Normal left ventricular size. Spectral  Doppler shows Left ventricular diastolic Doppler parameters are consistent  with impaired relaxation pattern of LV diastolic filling.    Neuro/Psych PSYCHIATRIC DISORDERS Depression negative neurological ROS     GI/Hepatic negative GI ROS, Neg liver ROS, GERD  Medicated and Controlled,  Endo/Other  Morbid obesity  Renal/GU negative Renal ROS  negative genitourinary   Musculoskeletal negative musculoskeletal ROS (+) Arthritis , Osteoarthritis,    Abdominal (+) + obese,   Peds negative pediatric ROS (+)  Hematology negative hematology ROS (+)   Anesthesia Other Findings   Reproductive/Obstetrics negative OB ROS                            Anesthesia Physical Anesthesia Plan  ASA: [name]  Anesthesia Plan: General   Post-op Pain Management:    Induction: Intravenous  PONV Risk Score and Plan:   Airway Management Planned: LMA and Oral  ETT  Additional Equipment:   Intra-op Plan:   Post-operative Plan:   Informed Consent: I have reviewed the patients History and Physical, chart, labs and discussed the procedure including the risks, benefits and alternatives for the proposed anesthesia with the patient or authorized representative who has indicated his/her understanding and acceptance.       Plan Discussed with: CRNA and Surgeon  Anesthesia Plan Comments: ( )       Anesthesia Quick Evaluation

## 2020-01-25 ENCOUNTER — Observation Stay (HOSPITAL_BASED_OUTPATIENT_CLINIC_OR_DEPARTMENT_OTHER)
Admission: RE | Admit: 2020-01-25 | Discharge: 2020-01-26 | Disposition: A | Discharge status: Home / Self Care | Payer: Medicare HMO | Attending: Urology | Admitting: Urology

## 2020-01-25 ENCOUNTER — Encounter (HOSPITAL_BASED_OUTPATIENT_CLINIC_OR_DEPARTMENT_OTHER)
Admission: RE | Disposition: A | Discharge status: Home / Self Care | Payer: Self-pay | Source: Home / Self Care | Attending: Urology

## 2020-01-25 ENCOUNTER — Encounter (HOSPITAL_BASED_OUTPATIENT_CLINIC_OR_DEPARTMENT_OTHER): Payer: Self-pay | Admitting: Urology

## 2020-01-25 ENCOUNTER — Ambulatory Visit (HOSPITAL_BASED_OUTPATIENT_CLINIC_OR_DEPARTMENT_OTHER): Payer: Medicare HMO | Admitting: Physician Assistant

## 2020-01-25 DIAGNOSIS — N5201 Erectile dysfunction due to arterial insufficiency: Secondary | ICD-10-CM | POA: Diagnosis not present

## 2020-01-25 DIAGNOSIS — M1990 Unspecified osteoarthritis, unspecified site: Secondary | ICD-10-CM | POA: Insufficient documentation

## 2020-01-25 DIAGNOSIS — I771 Stricture of artery: Secondary | ICD-10-CM | POA: Insufficient documentation

## 2020-01-25 DIAGNOSIS — I10 Essential (primary) hypertension: Secondary | ICD-10-CM | POA: Diagnosis not present

## 2020-01-25 DIAGNOSIS — K219 Gastro-esophageal reflux disease without esophagitis: Secondary | ICD-10-CM | POA: Diagnosis not present

## 2020-01-25 DIAGNOSIS — E785 Hyperlipidemia, unspecified: Secondary | ICD-10-CM | POA: Diagnosis not present

## 2020-01-25 DIAGNOSIS — N529 Male erectile dysfunction, unspecified: Secondary | ICD-10-CM | POA: Diagnosis not present

## 2020-01-25 DIAGNOSIS — G4730 Sleep apnea, unspecified: Secondary | ICD-10-CM | POA: Diagnosis not present

## 2020-01-25 DIAGNOSIS — Z888 Allergy status to other drugs, medicaments and biological substances status: Secondary | ICD-10-CM | POA: Insufficient documentation

## 2020-01-25 HISTORY — PX: PENILE PROSTHESIS IMPLANT: SHX240

## 2020-01-25 LAB — POCT I-STAT, CHEM 8
BUN: 12 mg/dL (ref 8–23)
Calcium, Ion: 1.19 mmol/L (ref 1.15–1.40)
Chloride: 103 mmol/L (ref 98–111)
Creatinine, Ser: 0.7 mg/dL (ref 0.61–1.24)
Glucose, Bld: 137 mg/dL — ABNORMAL HIGH (ref 70–99)
HCT: 46 % (ref 39.0–52.0)
Hemoglobin: 15.6 g/dL (ref 13.0–17.0)
Potassium: 4.2 mmol/L (ref 3.5–5.1)
Sodium: 139 mmol/L (ref 135–145)
TCO2: 26 mmol/L (ref 22–32)

## 2020-01-25 SURGERY — INSERTION, PENILE PROSTHESIS, INFLATABLE
Anesthesia: General | Site: Perineum

## 2020-01-25 MED ORDER — SUGAMMADEX SODIUM 200 MG/2ML IV SOLN
INTRAVENOUS | Status: DC | PRN
  Administered 2020-01-25: 300 mg via INTRAVENOUS

## 2020-01-25 MED ORDER — DOCUSATE SODIUM 100 MG PO CAPS
100.0000 mg | ORAL_CAPSULE | Freq: Two times a day (BID) | ORAL | Status: DC
  Filled 2020-01-25: qty 1

## 2020-01-25 MED ORDER — SODIUM CHLORIDE 0.9 % IV SOLN
INTRAVENOUS | Status: DC | PRN
  Administered 2020-01-25: 500 mL

## 2020-01-25 MED ORDER — MORPHINE SULFATE (PF) 2 MG/ML IV SOLN
2.0000 mg | INTRAVENOUS | Status: DC | PRN
  Administered 2020-01-25: 2 mg via INTRAVENOUS
  Filled 2020-01-25: qty 2

## 2020-01-25 MED ORDER — DEXAMETHASONE SODIUM PHOSPHATE 4 MG/ML IJ SOLN
INTRAMUSCULAR | Status: DC | PRN
  Administered 2020-01-25: 10 mg via INTRAVENOUS

## 2020-01-25 MED ORDER — ONDANSETRON HCL 4 MG/2ML IJ SOLN
INTRAMUSCULAR | Status: AC
  Filled 2020-01-25: qty 2

## 2020-01-25 MED ORDER — ACETAMINOPHEN 160 MG/5ML PO SOLN
325.0000 mg | ORAL | Status: DC | PRN
  Filled 2020-01-25: qty 20.3

## 2020-01-25 MED ORDER — CEFAZOLIN SODIUM-DEXTROSE 2-4 GM/100ML-% IV SOLN
INTRAVENOUS | Status: AC
  Filled 2020-01-25: qty 100

## 2020-01-25 MED ORDER — DEXAMETHASONE SODIUM PHOSPHATE 10 MG/ML IJ SOLN
INTRAMUSCULAR | Status: AC
  Filled 2020-01-25: qty 1

## 2020-01-25 MED ORDER — PROPOFOL 10 MG/ML IV BOLUS
INTRAVENOUS | Status: AC
  Filled 2020-01-25: qty 40

## 2020-01-25 MED ORDER — SENNA 8.6 MG PO TABS
ORAL_TABLET | ORAL | Status: AC
  Filled 2020-01-25: qty 1

## 2020-01-25 MED ORDER — OXYCODONE HCL 5 MG PO TABS
ORAL_TABLET | ORAL | Status: AC
  Filled 2020-01-25: qty 1

## 2020-01-25 MED ORDER — ONDANSETRON HCL 4 MG/2ML IJ SOLN
4.0000 mg | Freq: Once | INTRAMUSCULAR | Status: DC | PRN
  Filled 2020-01-25: qty 2

## 2020-01-25 MED ORDER — BENAZEPRIL HCL 40 MG PO TABS
40.0000 mg | ORAL_TABLET | Freq: Every day | ORAL | Status: DC
  Administered 2020-01-25: 40 mg via ORAL
  Filled 2020-01-25: qty 1

## 2020-01-25 MED ORDER — MIDAZOLAM HCL 5 MG/5ML IJ SOLN
INTRAMUSCULAR | Status: DC | PRN
  Administered 2020-01-25: 2 mg via INTRAVENOUS

## 2020-01-25 MED ORDER — ONDANSETRON HCL 4 MG/2ML IJ SOLN
4.0000 mg | INTRAMUSCULAR | Status: DC | PRN
  Filled 2020-01-25: qty 2

## 2020-01-25 MED ORDER — OXYCODONE HCL 5 MG PO TABS: 5.0000 mg | ORAL_TABLET | ORAL | 0 refills | Status: DC | PRN

## 2020-01-25 MED ORDER — ACETAMINOPHEN 325 MG PO TABS
650.0000 mg | ORAL_TABLET | ORAL | Status: DC | PRN
  Filled 2020-01-25: qty 2

## 2020-01-25 MED ORDER — OXYCODONE HCL 5 MG PO TABS
5.0000 mg | ORAL_TABLET | ORAL | Status: DC | PRN
  Administered 2020-01-25 – 2020-01-26 (×5): 5 mg via ORAL
  Filled 2020-01-25: qty 1

## 2020-01-25 MED ORDER — LIDOCAINE-EPINEPHRINE-TETRACAINE (LET) TOPICAL GEL
3.0000 mL | Freq: Once | TOPICAL | Status: AC
  Administered 2020-01-25: 3 mL via TOPICAL
  Filled 2020-01-25 (×2): qty 3

## 2020-01-25 MED ORDER — LACTATED RINGERS IV SOLN
INTRAVENOUS | Status: DC
  Filled 2020-01-25 (×2): qty 1000

## 2020-01-25 MED ORDER — MEPERIDINE HCL 25 MG/ML IJ SOLN
6.2500 mg | INTRAMUSCULAR | Status: DC | PRN
  Filled 2020-01-25: qty 1

## 2020-01-25 MED ORDER — BACITRACIN-NEOMYCIN-POLYMYXIN 400-5-5000 EX OINT
1.0000 "application " | TOPICAL_OINTMENT | Freq: Three times a day (TID) | CUTANEOUS | Status: DC | PRN
  Filled 2020-01-25: qty 1

## 2020-01-25 MED ORDER — CEFAZOLIN SODIUM-DEXTROSE 2-4 GM/100ML-% IV SOLN
2.0000 g | Freq: Three times a day (TID) | INTRAVENOUS | Status: AC
  Administered 2020-01-25 (×2): 2 g via INTRAVENOUS
  Filled 2020-01-25: qty 100

## 2020-01-25 MED ORDER — VANCOMYCIN HCL 1000 MG IV SOLR
INTRAVENOUS | Status: DC | PRN
  Administered 2020-01-25: 1000 mg via INTRAVENOUS

## 2020-01-25 MED ORDER — VANCOMYCIN HCL IN DEXTROSE 1-5 GM/200ML-% IV SOLN
1000.0000 mg | INTRAVENOUS | Status: AC
  Administered 2020-01-25: 1000 mg via INTRAVENOUS
  Filled 2020-01-25: qty 200

## 2020-01-25 MED ORDER — FENTANYL CITRATE (PF) 100 MCG/2ML IJ SOLN
INTRAMUSCULAR | Status: AC
  Filled 2020-01-25: qty 2

## 2020-01-25 MED ORDER — OXYCODONE HCL 5 MG/5ML PO SOLN
5.0000 mg | Freq: Once | ORAL | Status: DC | PRN
  Filled 2020-01-25: qty 5

## 2020-01-25 MED ORDER — KETOROLAC TROMETHAMINE 30 MG/ML IJ SOLN
INTRAMUSCULAR | Status: AC
  Filled 2020-01-25: qty 1

## 2020-01-25 MED ORDER — KETOROLAC TROMETHAMINE 30 MG/ML IJ SOLN
INTRAMUSCULAR | Status: DC | PRN
  Administered 2020-01-25: 30 mg via INTRAVENOUS

## 2020-01-25 MED ORDER — PANTOPRAZOLE SODIUM 40 MG PO TBEC
40.0000 mg | DELAYED_RELEASE_TABLET | Freq: Every evening | ORAL | Status: DC
  Administered 2020-01-25: 40 mg via ORAL
  Filled 2020-01-25: qty 1

## 2020-01-25 MED ORDER — PANTOPRAZOLE SODIUM 40 MG PO TBEC
DELAYED_RELEASE_TABLET | ORAL | Status: AC
  Filled 2020-01-25: qty 1

## 2020-01-25 MED ORDER — ROCURONIUM BROMIDE 100 MG/10ML IV SOLN
INTRAVENOUS | Status: DC | PRN
  Administered 2020-01-25: 60 mg via INTRAVENOUS

## 2020-01-25 MED ORDER — DIPHENHYDRAMINE HCL 12.5 MG/5ML PO ELIX
12.5000 mg | ORAL_SOLUTION | Freq: Four times a day (QID) | ORAL | Status: DC | PRN
  Filled 2020-01-25: qty 5

## 2020-01-25 MED ORDER — LIDOCAINE HCL (CARDIAC) PF 100 MG/5ML IV SOSY
PREFILLED_SYRINGE | INTRAVENOUS | Status: DC | PRN
  Administered 2020-01-25: 100 mg via INTRAVENOUS

## 2020-01-25 MED ORDER — DIPHENHYDRAMINE HCL 50 MG/ML IJ SOLN
12.5000 mg | Freq: Four times a day (QID) | INTRAMUSCULAR | Status: DC | PRN
  Filled 2020-01-25: qty 0.25

## 2020-01-25 MED ORDER — GENTAMICIN SULFATE 40 MG/ML IJ SOLN
5.0000 mg/kg | INTRAVENOUS | Status: AC
  Administered 2020-01-25: 544 mg via INTRAVENOUS
  Filled 2020-01-25: qty 13.5

## 2020-01-25 MED ORDER — ONDANSETRON HCL 4 MG/2ML IJ SOLN
INTRAMUSCULAR | Status: DC | PRN
  Administered 2020-01-25: 4 mg via INTRAVENOUS

## 2020-01-25 MED ORDER — MIDAZOLAM HCL 2 MG/2ML IJ SOLN
INTRAMUSCULAR | Status: AC
  Filled 2020-01-25: qty 2

## 2020-01-25 MED ORDER — CEFAZOLIN SODIUM-DEXTROSE 2-4 GM/100ML-% IV SOLN
2.0000 g | INTRAVENOUS | Status: AC
  Administered 2020-01-25: 2 g via INTRAVENOUS
  Filled 2020-01-25: qty 100

## 2020-01-25 MED ORDER — ROCURONIUM BROMIDE 10 MG/ML (PF) SYRINGE
PREFILLED_SYRINGE | INTRAVENOUS | Status: AC
  Filled 2020-01-25: qty 10

## 2020-01-25 MED ORDER — OXYCODONE HCL 5 MG PO TABS
5.0000 mg | ORAL_TABLET | Freq: Once | ORAL | Status: DC | PRN
  Filled 2020-01-25: qty 1

## 2020-01-25 MED ORDER — ZOLPIDEM TARTRATE 5 MG PO TABS
ORAL_TABLET | ORAL | Status: AC
  Filled 2020-01-25: qty 1

## 2020-01-25 MED ORDER — ACETAMINOPHEN 325 MG PO TABS
325.0000 mg | ORAL_TABLET | ORAL | Status: DC | PRN
  Filled 2020-01-25: qty 2

## 2020-01-25 MED ORDER — LIDOCAINE 2% (20 MG/ML) 5 ML SYRINGE
INTRAMUSCULAR | Status: AC
  Filled 2020-01-25: qty 5

## 2020-01-25 MED ORDER — ZOLPIDEM TARTRATE 5 MG PO TABS
5.0000 mg | ORAL_TABLET | Freq: Every evening | ORAL | Status: DC | PRN
  Administered 2020-01-25: 5 mg via ORAL
  Filled 2020-01-25: qty 1

## 2020-01-25 MED ORDER — FENTANYL CITRATE (PF) 100 MCG/2ML IJ SOLN
INTRAMUSCULAR | Status: DC | PRN
  Administered 2020-01-25: 50 ug via INTRAVENOUS
  Administered 2020-01-25: 100 ug via INTRAVENOUS
  Administered 2020-01-25: 50 ug via INTRAVENOUS

## 2020-01-25 MED ORDER — PROPOFOL 10 MG/ML IV BOLUS
INTRAVENOUS | Status: DC | PRN
  Administered 2020-01-25: 200 mg via INTRAVENOUS
  Administered 2020-01-25 (×2): 50 mg via INTRAVENOUS

## 2020-01-25 MED ORDER — SODIUM CHLORIDE 0.9 % IV SOLN
INTRAVENOUS | Status: AC
  Filled 2020-01-25: qty 600

## 2020-01-25 MED ORDER — MENTHOL 3 MG MT LOZG
LOZENGE | OROMUCOSAL | Status: AC
  Filled 2020-01-25: qty 9

## 2020-01-25 MED ORDER — SODIUM CHLORIDE 0.9 % IV SOLN
INTRAVENOUS | Status: DC
  Filled 2020-01-25 (×2): qty 1000

## 2020-01-25 MED ORDER — SENNA 8.6 MG PO TABS
1.0000 | ORAL_TABLET | Freq: Two times a day (BID) | ORAL | Status: DC
  Administered 2020-01-25 (×2): 8.6 mg via ORAL
  Filled 2020-01-25: qty 1

## 2020-01-25 MED ORDER — VANCOMYCIN HCL IN DEXTROSE 1-5 GM/200ML-% IV SOLN
INTRAVENOUS | Status: AC
  Filled 2020-01-25: qty 200

## 2020-01-25 MED ORDER — OXYBUTYNIN CHLORIDE 5 MG PO TABS
5.0000 mg | ORAL_TABLET | Freq: Three times a day (TID) | ORAL | Status: DC | PRN
  Administered 2020-01-25: 5 mg via ORAL
  Filled 2020-01-25 (×2): qty 1

## 2020-01-25 MED ORDER — SODIUM CHLORIDE 0.9 % IR SOLN
Status: DC | PRN
  Administered 2020-01-25: 500 mL

## 2020-01-25 MED ORDER — GENTAMICIN SULFATE 40 MG/ML IJ SOLN
5.0000 mg/kg | INTRAVENOUS | Status: DC
  Filled 2020-01-25: qty 17.25

## 2020-01-25 MED ORDER — SODIUM CHLORIDE 0.9 % IV SOLN: INTRAVENOUS | Status: DC | PRN

## 2020-01-25 MED ORDER — MORPHINE SULFATE (PF) 2 MG/ML IV SOLN
INTRAVENOUS | Status: AC
  Filled 2020-01-25: qty 1

## 2020-01-25 MED ORDER — SUCCINYLCHOLINE CHLORIDE 20 MG/ML IJ SOLN
INTRAMUSCULAR | Status: DC | PRN
  Administered 2020-01-25: 200 mg via INTRAVENOUS

## 2020-01-25 MED ORDER — LEVOFLOXACIN 750 MG PO TABS: 750.0000 mg | ORAL_TABLET | Freq: Every day | ORAL | 0 refills | Status: AC

## 2020-01-25 MED ORDER — FENTANYL CITRATE (PF) 100 MCG/2ML IJ SOLN
25.0000 ug | INTRAMUSCULAR | Status: DC | PRN
  Filled 2020-01-25: qty 1

## 2020-01-25 SURGICAL SUPPLY — 65 items
BAG DECANTER FOR FLEXI CONT (MISCELLANEOUS) ×4 IMPLANT
BAG URINE DRAIN 2000ML AR STRL (UROLOGICAL SUPPLIES) ×2 IMPLANT
BLADE HEX COATED 2.75 (ELECTRODE) ×2 IMPLANT
BLADE SURG 15 STRL LF DISP TIS (BLADE) ×1 IMPLANT
BLADE SURG 15 STRL SS (BLADE) ×2
BNDG COHESIVE 2X5 TAN STRL LF (GAUZE/BANDAGES/DRESSINGS) IMPLANT
BNDG GAUZE ELAST 4 BULKY (GAUZE/BANDAGES/DRESSINGS) ×2 IMPLANT
CATH FOLEY 2WAY SLVR  5CC 16FR (CATHETERS) ×2
CATH FOLEY 2WAY SLVR 5CC 16FR (CATHETERS) ×1 IMPLANT
CHLORAPREP W/TINT 26 (MISCELLANEOUS) ×2 IMPLANT
COVER BACK TABLE 60X90IN (DRAPES) ×2 IMPLANT
COVER MAYO STAND STRL (DRAPES) ×4 IMPLANT
COVER SURGICAL LIGHT HANDLE (MISCELLANEOUS) ×2 IMPLANT
COVER WAND RF STERILE (DRAPES) ×2 IMPLANT
DERMABOND ADVANCED (GAUZE/BANDAGES/DRESSINGS) ×1
DERMABOND ADVANCED .7 DNX12 (GAUZE/BANDAGES/DRESSINGS) ×1 IMPLANT
DRAIN CHANNEL 10F 3/8 F FF (DRAIN) ×2 IMPLANT
DRAPE INCISE IOBAN 66X45 STRL (DRAPES) ×2 IMPLANT
DRAPE LAPAROTOMY 100X72 PEDS (DRAPES) ×2 IMPLANT
EVACUATOR DRAINAGE 7X20 100CC (MISCELLANEOUS) IMPLANT
EVACUATOR SILICONE 100CC (DRAIN) IMPLANT
EVACUATOR SILICONE 100CC (MISCELLANEOUS)
GAUZE SPONGE 4X4 12PLY STRL (GAUZE/BANDAGES/DRESSINGS) ×2 IMPLANT
GAUZE SPONGE 4X4 12PLY STRL LF (GAUZE/BANDAGES/DRESSINGS) ×2 IMPLANT
GLOVE BIO SURGEON STRL SZ 6.5 (GLOVE) ×2 IMPLANT
GLOVE BIO SURGEON STRL SZ7 (GLOVE) ×2 IMPLANT
GLOVE BIO SURGEON STRL SZ7.5 (GLOVE) ×6 IMPLANT
GLOVE BIOGEL PI IND STRL 7.0 (GLOVE) ×1 IMPLANT
GLOVE BIOGEL PI IND STRL 7.5 (GLOVE) ×2 IMPLANT
GLOVE BIOGEL PI INDICATOR 7.0 (GLOVE) ×1
GLOVE BIOGEL PI INDICATOR 7.5 (GLOVE) ×2
GOWN SRG LRG 43XLVL 3 (GOWN DISPOSABLE) ×1 IMPLANT
GOWN STRL NON-REIN LRG LVL3 (GOWN DISPOSABLE) ×2
GOWN STRL REUS W/TWL XL LVL3 (GOWN DISPOSABLE) ×4 IMPLANT
HOLDER FOLEY CATH W/STRAP (MISCELLANEOUS) ×2 IMPLANT
HOOD PEEL AWAY FLYTE STAYCOOL (MISCELLANEOUS) ×4 IMPLANT
KIT TITAN ASSEMBLY (Erectile Restoration) ×2 IMPLANT
KIT TITAN ASSEMBLY STANDARD (Erectile Restoration) ×1 IMPLANT
KIT TITAN ASSEMBLY STD (Erectile Restoration) ×1 IMPLANT
NEEDLE HYPO 22GX1.5 SAFETY (NEEDLE) ×2 IMPLANT
NS IRRIG 1000ML POUR BTL (IV SOLUTION) ×2 IMPLANT
PACK BASIN DAY SURGERY FS (CUSTOM PROCEDURE TRAY) ×2 IMPLANT
PENCIL BUTTON HOLSTER BLD 10FT (ELECTRODE) ×2 IMPLANT
PLUG CATH AND CAP STER (CATHETERS) ×2 IMPLANT
PROS TITAN SCROT 0 ANG 18CM (Erectile Restoration) ×2 IMPLANT
PROSTHESIS TTN SCRO 0 ANG 18CM (Erectile Restoration) ×1 IMPLANT
RESERVOIR 75CC LOCKOUT BIOFLEX (Erectile Restoration) ×2 IMPLANT
RETRACTOR WILSON SYSTEM (INSTRUMENTS) ×2 IMPLANT
SUPPORT SCROTAL LG STRP (MISCELLANEOUS) IMPLANT
SUPPORTER ATHLETIC XL (MISCELLANEOUS) ×2
SUPPORTER ATHLETIC XL 3X44-50X (MISCELLANEOUS) ×1 IMPLANT
SUT MNCRL AB 3-0 PS2 18 (SUTURE) ×2 IMPLANT
SUT VIC AB 2-0 SH 27 (SUTURE) ×10
SUT VIC AB 2-0 SH 27XBRD (SUTURE) ×5 IMPLANT
SUT VIC AB 2-0 UR6 27 (SUTURE) ×4 IMPLANT
SUT VIC AB 4-0 PS2 18 (SUTURE) IMPLANT
SYR 10ML LL (SYRINGE) ×4 IMPLANT
SYR 50ML LL SCALE MARK (SYRINGE) ×4 IMPLANT
SYR BULB IRRIGATION 50ML (SYRINGE) ×2 IMPLANT
SYR CONTROL 10ML LL (SYRINGE) ×2 IMPLANT
TOWEL OR 17X26 10 PK STRL BLUE (TOWEL DISPOSABLE) ×4 IMPLANT
TRAY DSU PREP LF (CUSTOM PROCEDURE TRAY) ×2 IMPLANT
TUBE CONNECTING 12X1/4 (SUCTIONS) ×2 IMPLANT
WATER STERILE IRR 1000ML POUR (IV SOLUTION) ×2 IMPLANT
YANKAUER SUCT BULB TIP NO VENT (SUCTIONS) ×4 IMPLANT

## 2020-01-25 NOTE — Discharge Instructions (Addendum)
Please make sure to grab the scrotal pump and pull it down at least 2 times a day for the it does not scar up high  Discharge instructions following scrotal surgery  Call your doctor for:  Fever is greater than 100.5  Severe nausea or vomiting  Increasing pain not controlled by pain medication  Increasing redness or drainage from incisions  The number for questions or concerns is [phone]  Activity level: No lifting greater than 20 pounds (about equal to milk) for the next 2 weeks or until cleared to do so at follow-up appointment.  Otherwise activity as tolerated by comfort level.  Diet: May resume your regular diet as tolerated  Driving: No driving while still taking opiate pain medications (weight at least 6-8 hours after last dose).  No driving if you still sore from surgery as it may limit her ability to react quickly if necessary.   Shower/bath: May shower and get incision wet pad dry immediately following.  Do not scrub vigorously for the next 2-3 weeks.  Do not soak incision (ID soaking in bath or swimming) until told he may do so by Dr., as this may promote a wound infection.  Wound care: He may cover wounds with sterile gauze as needed to prevent incisions rubbing on close follow-up in any seepage.  Where tight fitting underpants/scrotal support for at least 2 weeks.  He should apply cold compresses (ice or sac of frozen peas/corn) to your scrotum for at least 48 hours to reduce the swelling for 15 minutes at a time indirectly.  You should expect that his scrotum will swell up initially and then get smaller over the next 2-4 weeks.  Follow-up appointments: Follow-up appointment will be scheduled with Dr. Gloriann Loan for a wound check.

## 2020-01-25 NOTE — H&P (Signed)
CC/HPI: CC: Erectile dysfunction  HPI:  74 year old male referred by Dr. winter for erectile dysfunction. He failed oral PDE 5 inhibitors. Viagra gave him headaches and Cialis just did not work. Intracorporeal injection at an outside evaluation facility worked for him but it was painful and he did not like the process of it. He is most interested in an inflatable penile prosthesis.     ALLERGIES: No Allergies    MEDICATIONS: Benazepril Hcl 20 mg tablet  Pantoprazole Sodium 40 MG Oral Tablet Delayed Release Oral     GU PSH: No GU PSH      PSH Notes: Throat Surgery, Shoulder Surgery, Knee Arthroscopy   NON-GU PSH: No Non-GU PSH    GU PMH: Encounter for Prostate Cancer screening - 10/29/2019 Family Hx of Prostate Cancer - 10/29/2019 ED due to arterial insufficiency, Erectile dysfunction due to arterial insufficiency - 2014 Prostate nodule w/o LUTS, Nodular prostate without lower urinary tract symptoms - 2014      PMH Notes:  1898-10-08 00:00:00 - Note: Normal Routine History And Physical Adult  2013-06-04 08:33:53 - Note: Arthritis   NON-GU PMH: Personal history of other diseases of the circulatory system, History of hypertension - 2014 Personal history of other specified conditions, History of heartburn - 2014 GERD Hypertension Sleep Apnea    FAMILY HISTORY: Blood In Urine - Father Breast Cancer - Sister Death In The Family Father - Father Death In The Family Mother - Mother Family Health Status Number - Runs In Family Heart Disease - Brother, Father Laryngeal Cancer - Brother Malignant Melanoma Of The Skin - Sister Prostate Cancer - Father Stroke Syndrome - Mother   SOCIAL HISTORY: Marital Status: Divorced Preferred Language: English; Race: White Current Smoking Status: Patient has never smoked.   Tobacco Use Assessment Completed: Used Tobacco in last 30 days? Drinks 3 drinks per week.  Drinks 2 caffeinated drinks per day.     Notes: Never A Smoker, Occupation:,  Marital History - Single, Caffeine Use, Tobacco Use, Alcohol Use   REVIEW OF SYSTEMS:    GU Review Male:   Patient denies frequent urination, hard to postpone urination, burning/ pain with urination, get up at night to urinate, leakage of urine, stream starts and stops, trouble starting your stream, have to strain to urinate , erection problems, and penile pain.  Gastrointestinal (Upper):   Patient denies nausea, vomiting, and indigestion/ heartburn.  Gastrointestinal (Lower):   Patient denies diarrhea and constipation.  Constitutional:   Patient denies fever, night sweats, weight loss, and fatigue.  Skin:   Patient denies skin rash/ lesion and itching.  Eyes:   Patient denies blurred vision and double vision.  Ears/ Nose/ Throat:   Patient denies sore throat and sinus problems.  Hematologic/Lymphatic:   Patient denies swollen glands and easy bruising.  Cardiovascular:   Patient denies leg swelling and chest pains.  Respiratory:   Patient denies cough and shortness of breath.  Endocrine:   Patient denies excessive thirst.  Musculoskeletal:   Patient denies back pain and joint pain.  Neurological:   Patient denies headaches and dizziness.  Psychologic:   Patient denies depression and anxiety.   Notes: discuss IPP    VITAL SIGNS:      11/20/2019 09:22 AM  Weight 300 lb / 136.08 kg  Height 76 in / 193.04 cm  BP 207/117 mmHg  Heart Rate 93 /min  Temperature 97.3 F / 36.2 C  BMI 36.5 kg/m   GU PHYSICAL EXAMINATION:    Testes: No tenderness,  no swelling, no enlargement left testes. No tenderness, no swelling, no enlargement right testes. Normal location left testes. Normal location right testes. No mass, no cyst, no varicocele, no hydrocele left testes. No mass, no cyst, no varicocele, no hydrocele right testes.  Penis: Circumcised, somewhat short   MULTI-SYSTEM PHYSICAL EXAMINATION:    Gastrointestinal: No mass, no tenderness, no rigidity, obese abdomen.      PAST DATA REVIEWED:   Source Of History:  Patient  Lab Test Review:   PSA  Records Review:   Previous Doctor Records, Previous Patient Records  Urine Test Review:   Urinalysis   10/29/19  PSA  Total PSA 1.59 ng/mL   Notes:                     PSA at the last visit was normal.   PROCEDURES:          Urinalysis Dipstick Dipstick Cont'd  Color: Amber Bilirubin: Neg mg/dL  Appearance: Clear Ketones: Neg mg/dL  Specific Gravity: 1.025 Blood: Neg ery/uL  pH: 6.5 Protein: Neg mg/dL  Glucose: Neg mg/dL Urobilinogen: 1.0 mg/dL    Nitrites: Neg    Leukocyte Esterase: Neg leu/uL    ASSESSMENT:      ICD-10 Details  1 GU:   ED due to arterial insufficiency - N52.01 Chronic, Stable   PLAN:           Orders Labs Urine Culture          Schedule Procedure: Unspecified Date - Insertion IPP [id number]          Document Letter(s):  Created for Patient: Clinical Summary         Notes:   The patient has elected to undergo placement of a three-piece inflatable penile prosthesis. He understands that this is an invasive surgical option for erectile dysfunction and will result in permanent inability to achieve natural erections. He understands the potential complications of the surgery including bleeding, pain, ST deformity, injury to surrounding structures such as urethral injury, erosion of the device, as well as the potentially devastating complication of infection that would require complete removal of the entire prosthesis. He also understands that the typical lifespan of a prosthetic is 5-10 years (but may last shorter or longer) and if it becomes nonfunctional then it will require replacement. He understands that the length of his erection will be approximately equal to stretched penile length and that the most common complaint among men after this surgery is the appearance of a smaller erection. We discussed that there is an overall high satisfaction rate upwards of 80-90% among men who undergo the surgery. He is  eager to proceed.   CC: Raiford Noble  Dr. winter    Signed by Link Snuffer, [name], M.D. on 11/20/19 at 9:39 AM (EST

## 2020-01-25 NOTE — Interval H&P Note (Signed)
History and Physical Interval Note:  01/25/2020 7:20 AM  Hector Edwards  has presented today for surgery, with the diagnosis of ERECTILE DYSFUNCTION.  The various methods of treatment have been discussed with the patient and family. After consideration of risks, benefits and other options for treatment, the patient has consented to  Procedure(s): [name] (N/A) as a surgical intervention.  The patient's history has been reviewed, patient examined, no change in status, stable for surgery.  I have reviewed the patient's chart and labs.  Questions were answered to the patient's satisfaction.     [name], [name]

## 2020-01-25 NOTE — Transfer of Care (Signed)
Immediate Anesthesia Transfer of Care Note  Patient: Hector Edwards  Procedure(s) Performed: Procedure(s) (LRB): PENILE PROTHESIS INFLATABLE COLOPLAST (N/A)  Patient Location: PACU  Anesthesia Type: General  Level of Consciousness: awake, sedated, patient cooperative and responds to stimulation  Airway & Oxygen Therapy: Patient Spontanous Breathing and Patient connected to [place] 02 and soft FM   Post-op Assessment: Report given to PACU RN, Post -op Vital signs reviewed and stable and Patient moving all extremities  Post vital signs: Reviewed and stable  Complications: No apparent anesthesia complications

## 2020-01-25 NOTE — Op Note (Signed)
Operative Note  Preoperative diagnosis:  1.  Erectile dysfunction  Postoperative diagnosis: 1.  Erectile dysfunction  Procedure(s): 1.  Insertion of Coloplast inflatable penile prosthesis  Surgeon: Link Snuffer, MD  Assistants: None  Anesthesia: General  Complications: None immediate  EBL: 100 cc  Specimens: 1.  None  Drains/Catheters: 1.  Foley catheter 2.  JP drain  Intraoperative findings: Corporal length was the following: Right proximal 13 cm, right distal 9.5cm, left proximal 13 cm, left distal 9.5 cm.  Therefore a 20 cm + 1.5 cm rear tip extender was placed.  The reservoir was placed in the right retropubic space.  Indication: 74 year old male with erectile dysfunction refractory to medical management presents for the previously mentioned operation.  Description of procedure:  The patient was identified and consent was obtained.  The patient was taken to the operating room and placed in the supine position.  The patient was placed under general anesthesia.  Perioperative antibiotics were administered. Patient was prepped and draped in a standard sterile fashion and a timeout was performed.  A Foley catheter was placed.  A 4 cm transverse penoscrotal incision was made with Bovie electrocautery on cut setting.  Blunt dissection clear tissue away from the corpora bilaterally.  Sharp dissection was used to carefully take down the septum taking great care to avoid the urethra.  Ring retractor was used to assist with retraction.  Additional sharp dissection was performed bilaterally to expose the corpora.  Spot electrocautery was used for hemostasis.  2 separate 2-0 Vicryl sutures were placed on each side in the proximal corpora.  An incision was made in between each of these bilaterally to make our corporotomy.  First Metzenbaum scissors were used to carefully dilate proximally and distally on each side.  Brooks dilators were then used from a size of 11-12 to carefully dilate  proximally and distally.  There was minimal resistance with dilation.  We then proceeded with measurement with the measurements noted above.  We irrigated the corpora with antibiotic solution and there was no evidence of any urethral injury.  We inserted the [facility] dilators proximally to confirm there was no crossover and also did this distally as well.  There was no evidence of any crossover.  We thoroughly irrigated the wound.  We bluntly developed the space for a reservoir piercing the transversalis fascia on the right bluntly and gained access to the retropubic space. 0.25 percent Marcaine was instilled into the area bilaterally for anesthetic effect.  At this point in time all members of the surgical team changed their over gloves.  Sterile towel was placed inferior to the scrotum.  The device was prepared.  The sizing of the device is noted above.  First, the reservoir was placed in the right retropubic space and 75 cc of normal saline filled the reservoir.  We then used the furlough device to pierce the distal glands with the [facility] needle.  On each side we first inserted the proximal portion of the cylinder into the proximal corpora followed by situation of the distal portion and the distal corpora and it sat nicely within the corpora.  We then test inflated the device and noted it to be in excellent position and it was even on each side.  There was a slight ST deformity.  There was no significant curvature.  We then secured down the stay sutures to close the corporotomy.  To further close the corporotomy, additional 2-0 Vicryl sutures were carefully placed and secured down to close the  corporotomy.  Once bilateral corporotomies were closed, excess tubing was trimmed and properly connected together.  Copious antibiotic solution was used to irrigate the area.  A dartos pouch on the anterior scrotum was then created and the pump was placed within this.  The dartos tissue was closed with a pursestring stitch to  keep the pump in place.  A JP drain was placed with the tubing coming out the right side.  This was secured down with a drain stitch.  The dartos tissue was closed with running 2-0 Vicryl to cover the tubing.  A second layer of dartos was then closed with 2-0 Vicryl in a running fashion to close the incision.  The skin was then closed with running 4-0 Monocryl and Dermabond.  The prosthetic was inflated about 70%.  The scrotum and penis were then wrapped with a mummy wrap dressing.  This concluded the operation.  The patient tolerated the procedure well and was stable postoperatively.  Plan: The patient will be monitored overnight.  His Foley catheter will be removed tomorrow and the prosthesis will be deflated.  After the drain is removed he will be discharged with 7 days of p.o. antibiotic.  He will return in 6 weeks for a postoperative check and to be taught how to use the device.

## 2020-01-25 NOTE — Anesthesia Procedure Notes (Signed)
Procedure Name: Intubation Date/Time: 01/25/2020 7:40 AM Performed by: Justice Rocher, CRNA Pre-anesthesia Checklist: Patient identified, Emergency Drugs available, Suction available and Patient being monitored Patient Re-evaluated:Patient Re-evaluated prior to induction Oxygen Delivery Method: Circle system utilized Preoxygenation: Pre-oxygenation with 100% oxygen Induction Type: IV induction Ventilation: Mask ventilation without difficulty Laryngoscope Size: Mac, 4 and Glidescope Grade View: Grade [name] Tube type: Oral Tube size: 8.0 mm Number of attempts: 1 Airway Equipment and Method: Stylet and Oral airway Placement Confirmation: ETT inserted through vocal cords under direct vision,  positive ETCO2,  breath sounds checked- equal and bilateral and CO2 detector Secured at: 23 cm Tube secured with: Tape Dental Injury: Teeth and Oropharynx as per pre-operative assessment

## 2020-01-26 DIAGNOSIS — N529 Male erectile dysfunction, unspecified: Secondary | ICD-10-CM | POA: Diagnosis not present

## 2020-01-26 MED ORDER — OXYCODONE HCL 5 MG PO TABS
ORAL_TABLET | ORAL | Status: AC
  Filled 2020-01-26: qty 1

## 2020-01-26 MED ORDER — BACITRACIN-NEOMYCIN-POLYMYXIN OINTMENT TUBE
TOPICAL_OINTMENT | CUTANEOUS | Status: AC
  Filled 2020-01-26: qty 14.17

## 2020-01-26 NOTE — Anesthesia Postprocedure Evaluation (Signed)
Anesthesia Post Note  Patient: Hector Edwards  Procedure(s) Performed: PENILE PROTHESIS INFLATABLE COLOPLAST (N/A Perineum)     Patient location during evaluation: PACU Anesthesia Type: General Level of consciousness: awake and alert Pain management: pain level controlled Vital Signs Assessment: post-procedure vital signs reviewed and stable Respiratory status: spontaneous breathing, nonlabored ventilation, respiratory function stable and patient connected to nasal cannula oxygen Cardiovascular status: blood pressure returned to baseline and stable Postop Assessment: no apparent nausea or vomiting Anesthetic complications: no    Last Vitals:  Vitals:   01/26/20 0551 01/26/20 0834  BP: (!) 142/77 130/73  Pulse: 84 88  Resp: 18 18  Temp: 36.6 C 36.8 C  SpO2: 97% 94%    Last Pain:  Vitals:   01/26/20 0758  TempSrc:   PainSc: 3    Pain Goal: Patients Stated Pain Goal: 2 (01/26/20 0758)                 Akia Desroches

## 2020-01-26 NOTE — Discharge Summary (Signed)
Physician Discharge Summary  Patient ID: Hector Edwards MRN: [id number] DOB/AGE: 12-12-1945 74 y.o.  Admit date: 01/25/2020 Discharge date: 01/26/2020  Admission Diagnoses:  Discharge Diagnoses:  Active Problems:   Erectile dysfunction   Discharged Condition: good  Hospital Course: Patient underwent placement of inflatable penile prosthesis.  He remained overnight for observation.  JP drain was removed and he was able to void.  Consults: None  Significant Diagnostic Studies: None  Treatments: surgery: As above  Discharge Exam: Blood pressure (!) 142/77, pulse 84, temperature 97.9 F (36.6 C), resp. rate 18, height 6\' 4"  (1.93 m), weight (!) 141.9 kg, SpO2 97 %. General appearance: alert no acute distress Adequate perfusion of extremities Nonlabored respiration Obese, nontender Circumcised phallus.  Prosthesis was deflated at the bedside.  He had some bruising in the scrotum and small amount of hematoma.  JP drain with scant output  Disposition: Discharge disposition: 01-Home or Self Care        Allergies as of 01/26/2020      Reactions   Enalapril Maleate    REACTION: cramps   Hctz [hydrochlorothiazide]    SOB   Tribenzor [olmesartan-amlodipine-hctz] Swelling   Fatigue, CP, edema   Verapamil    REACTION: Fatigue      Medication List    TAKE these medications   benazepril 20 MG tablet Commonly known as: LOTENSIN Take 2 tablets (40 mg total) by mouth daily.   levofloxacin 750 MG tablet Commonly known as: Levaquin Take 1 tablet (750 mg total) by mouth daily for 7 days.   oxyCODONE 5 MG immediate release tablet Commonly known as: Oxy IR/ROXICODONE Take 1 tablet (5 mg total) by mouth every 4 (four) hours as needed for moderate pain.   pantoprazole 40 MG tablet Commonly known as: PROTONIX Take 1 tablet (40 mg total) by mouth daily. What changed: when to take this   [place] Name: vitamin b 1000 bid        Signed: [name],  [name] 01/26/2020, 8:33 AM

## 2020-01-28 DIAGNOSIS — N5201 Erectile dysfunction due to arterial insufficiency: Secondary | ICD-10-CM | POA: Diagnosis not present

## 2020-01-28 DIAGNOSIS — M7981 Nontraumatic hematoma of soft tissue: Secondary | ICD-10-CM | POA: Diagnosis not present

## 2020-02-08 DIAGNOSIS — R69 Illness, unspecified: Secondary | ICD-10-CM | POA: Diagnosis not present

## 2020-02-10 DIAGNOSIS — R69 Illness, unspecified: Secondary | ICD-10-CM | POA: Diagnosis not present

## 2020-03-11 DIAGNOSIS — M25561 Pain in right knee: Secondary | ICD-10-CM | POA: Diagnosis not present

## 2020-04-04 NOTE — Progress Notes (Signed)
Chronic Care Management Pharmacy  Name: Hector Edwards  MRN: [id number] DOB: September 16, 1946  Chief Complaint/ HPI  Hector Edwards,  74 y.o. , male presents for their Follow-Up CCM visit with the clinical pharmacist via telephone due to COVID-19 Pandemic. Reports osteoarthritis causing significant pain in left knee.   Osteoarthritis-related pain in knee has interfered with routine exercise. Since last visit has started going to the [facility] several times/week as tolerated and has been focusing on portion control for diet. Was given prescription for meloxicam and a steroid injection three weeks ago. Prior to injection, pain was 7-8/10 and is now 1-2/10 on avg.   PCP : Brunetta Jeans, PA-C  Their chronic conditions include: HTN, HLD, GERD, osteoarthritis   Office Visits: 01/25/2020 (PCP): Placement of penile prosthesis.  09/27/2020 (PCP): HTN, HLD follow-up; amb referral to urology for ED.  Consult Visits: 06/15/2020 (Dr. Leafy Ro, [facility] weight management): Kvion states his desired weight loss is 52 lbs.  Patient Active Problem List   Diagnosis Date Noted  . OSA (obstructive sleep apnea) 08/01/2018  . Benign prostatic hyperplasia with nocturia 11/26/2016  . Prostate cancer screening 11/26/2016  . Medicare annual wellness visit, subsequent 11/26/2016  . Hx of adenomatous colonic polyps 09/04/2015  . Annual physical exam 07/20/2015  . Visit for preventive health examination 05/04/2013  . Erectile dysfunction 05/04/2013  . Snoring 02/13/2012  . GERD (gastroesophageal reflux disease) 08/12/2011  . Osteoarthritis 08/08/2011  . Hyperlipidemia 10/13/2007  . Essential hypertension 10/13/2007   Past Surgical History:  Procedure Laterality Date  . COLONOSCOPY  2004, 2021   hyperplastic polyps x 2  . KNEE ARTHROSCOPY Right   . PENILE PROSTHESIS IMPLANT N/A 01/25/2020   Procedure: PENILE PROTHESIS INFLATABLE COLOPLAST;  Surgeon: Lucas Mallow, MD;  Location: [facility];   Service: Urology;  Laterality: N/A;  . SHOULDER ARTHROSCOPY W/ ROTATOR CUFF REPAIR Left   . TONSILLECTOMY  as child  . vocal cord growth removed  age 81   benign   Social History   Socioeconomic History  . Marital status: Divorced    Spouse name: Not on file  . Number of children: Not on file  . Years of education: Not on file  . Highest education level: Not on file  Occupational History  . Occupation: Retired  Tobacco Use  . Smoking status: Never Smoker  . Smokeless tobacco: Never Used  Vaping Use  . Vaping Use: Never used  Substance and Sexual Activity  . Alcohol use: Yes    Alcohol/week: 4.0 standard drinks    Types: 4 Cans of beer per week    Comment: 4 cans/week  . Drug use: No  . Sexual activity: Not Currently  Other Topics Concern  . Not on file  Social History Narrative   Regular exercise- yes, seldom; working physically all the time.   Social Determinants of Health   Financial Resource Strain:   . Difficulty of Paying Living Expenses:   Food Insecurity:   . Worried About Charity fundraiser in the Last Year:   . Arboriculturist in the Last Year:   Transportation Needs:   . Film/video editor (Medical):   Marland Kitchen Lack of Transportation (Non-Medical):   Physical Activity:   . Days of Exercise per Week:   . Minutes of Exercise per Session:   Stress:   . Feeling of Stress :   Social Connections:   . Frequency of Communication with Friends and Family:   .  Frequency of Social Gatherings with Friends and Family:   . Attends Religious Services:   . Active Member of Clubs or Organizations:   . Attends Archivist Meetings:   Marland Kitchen Marital Status:    Family History  Problem Relation Age of Onset  . Breast cancer Daughter   . Hypertension Father   . Heart disease Father   . Colon polyps Father   . Stroke Mother   . Depression Mother   . Hypertension Other   . Pancreatic cancer Brother   . Colon polyps Brother   . Throat cancer Brother   . Colon  cancer Neg Hx   . Esophageal cancer Neg Hx   . Stomach cancer Neg Hx   . Rectal cancer Neg Hx    Allergies  Allergen Reactions  . Enalapril Maleate     REACTION: cramps  . Hctz [Hydrochlorothiazide]     SOB  . Tribenzor [Olmesartan-Amlodipine-Hctz] Swelling    Fatigue, CP, edema  . Verapamil     REACTION: Fatigue   Outpatient Encounter Medications as of 04/06/2020  Medication Sig  . benazepril (LOTENSIN) 20 MG tablet Take 2 tablets (40 mg total) by mouth daily.  . meloxicam (MOBIC) 15 MG tablet Take 15 mg by mouth daily. Take one tablet by mouth once daily  . oxyCODONE (OXY IR/ROXICODONE) 5 MG immediate release tablet Take 1 tablet (5 mg total) by mouth every 4 (four) hours as needed for moderate pain.  . pantoprazole (PROTONIX) 40 MG tablet Take 1 tablet (40 mg total) by mouth daily. (Patient taking differently: Take 40 mg by mouth every evening. )  . UNABLE TO FIND Med Name: vitamin b 1000 bid   No facility-administered encounter medications on file as of 04/06/2020.   Patient Care Team    Relationship Specialty Notifications Start End  Brunetta Jeans, [place] PCP - General Physician Assistant  05/04/13   Gatha Mayer, MD Consulting Physician Gastroenterology  06/20/15   Druscilla Brownie, MD Consulting Physician Dermatology  06/20/15   Jettie Booze, MD Consulting Physician Cardiology  12/09/16   Madelin Rear, [facility] Pharmacist Pharmacist  01/04/20    Comment: phone number [phone]   Current Diagnosis/Assessment:  Goals Addressed            This Visit's Progress   . PharmD Care Plan       CARE PLAN ENTRY  Current Barriers:  . Chronic Disease Management support, education, and care coordination needs related to GERD, osteoarthritis, HTN and HLD  Pharmacist Clinical Goal(s):  . BP <140/90 . LDL <100 . Minimize GERD symptoms  . Minimize knee pain   Interventions: . Comprehensive medication review performed. . Recommend Voltaren 1% gel - apply 4 grams to  knee four times daily (max 16 grams daily)  Patient Self Care Activities:  . Continued exercise at [facility] at least 3x/week for 30 minutes . Reduce intake of fatty foods . Patient verbalizes understanding of plan to call pharmacist, Hector Edwards, with any questions.  Initial goal documentation.      Osteoarthritis    Current pain in knee is on avg is 1-2/10. Received steroid injection three weeks ago. Patient is currently controlled on the following medications:  Marland Kitchen Meloxicam 15 mg once daily   We discussed: Counseled on NSAID use, recommended topical voltaren gel prn  Plan  Continue current medications.  Recommend OTC voltaren 1% gel 4 grams four times daily as needed for knee pain.  Hypertension   Office blood pressures are  BP Readings from Last 3 Encounters:  01/26/20 130/73  10/19/19 (!) 146/76  09/28/19 132/82   BP has routinely been <140/90 w/ exception of 10/2019 visit. Denies chest pain or dizziness.Patient checks BP at home infrequently.    Patient is currently controlled on the following medications:  . Benazapril 40 mg daily   We discussed diet and exercise extensively.  Plan Continue current medications.  Hyperlipidemia / Weight loss   Lipid Panel     Component Value Date/Time   CHOL 142 09/28/2019 1035   CHOL 170 06/16/2019 1035   TRIG 90.0 09/28/2019 1035   HDL 23.30 (L) 09/28/2019 1035   HDL 32 (L) 06/16/2019 1035   CHOLHDL 6 09/28/2019 1035   VLDL 18.0 09/28/2019 1035   LDLCALC 101 (H) 09/28/2019 1035   LDLCALC 121 (H) 06/16/2019 1035   LDLDIRECT 158.5 10/19/2008 0858   LABVLDL 17 06/16/2019 1035    The 10-year ASCVD risk score Mikey Bussing DC Jr., et al., 2013) is: 26.5%   Values used to calculate the score:     Age: 75 years     Sex: Male     Is Non-Hispanic African American: No     Diabetic: No     Tobacco smoker: No     Systolic Blood Pressure: 811 mmHg     Is BP treated: Yes     HDL Cholesterol: 23.3 mg/dL     Total Cholesterol: 142 mg/dL    Patient has taken atorvastatin and pravastatin previously. Currently is not on any lipid-lowering agents. Previous LDL near goal of < 100.  Previous wt reported was 305 lb last OV. Today weight is 300 lbs per patient.  We discussed:  diet and exercise extensively  Plan Continue control with diet and exercise   Medication Management   Receives prescription medications from:  [facility] - [place], [place] - [address] [place] [address] Phone: [phone] Fax: [phone]  Reviewed patient's [facility] medication and Epic medication profile assuring there are no discrepancies or gaps in therapy. Confirmed all fill dates appropriate and verified with patient that there is a sufficient quantity of all prescribed medications at home. Informed patient to call me any time if needing medications before scheduled deliveries.   Plan  Continue current medication management strategy.  Follow up: 6 month phone visit.  Weight loss, pain, HTN, HLD __________________  Madelin Rear, Pharm.D. Clinical Pharmacist [facility] [phone]

## 2020-04-06 ENCOUNTER — Ambulatory Visit: Payer: Medicare HMO

## 2020-04-06 DIAGNOSIS — E785 Hyperlipidemia, unspecified: Secondary | ICD-10-CM

## 2020-04-06 DIAGNOSIS — M1990 Unspecified osteoarthritis, unspecified site: Secondary | ICD-10-CM

## 2020-04-08 DIAGNOSIS — N5201 Erectile dysfunction due to arterial insufficiency: Secondary | ICD-10-CM | POA: Diagnosis not present

## 2020-04-26 DIAGNOSIS — R69 Illness, unspecified: Secondary | ICD-10-CM | POA: Diagnosis not present

## 2020-05-04 ENCOUNTER — Other Ambulatory Visit: Payer: Self-pay | Admitting: Emergency Medicine

## 2020-05-04 DIAGNOSIS — K219 Gastro-esophageal reflux disease without esophagitis: Secondary | ICD-10-CM

## 2020-05-04 MED ORDER — PANTOPRAZOLE SODIUM 40 MG PO TBEC: 40.0000 mg | DELAYED_RELEASE_TABLET | Freq: Every evening | ORAL | 1 refills | Status: DC

## 2020-05-10 DIAGNOSIS — R69 Illness, unspecified: Secondary | ICD-10-CM | POA: Diagnosis not present

## 2020-06-30 DIAGNOSIS — N529 Male erectile dysfunction, unspecified: Secondary | ICD-10-CM | POA: Diagnosis not present

## 2020-06-30 DIAGNOSIS — K219 Gastro-esophageal reflux disease without esophagitis: Secondary | ICD-10-CM | POA: Diagnosis not present

## 2020-06-30 DIAGNOSIS — Z6838 Body mass index (BMI) 38.0-38.9, adult: Secondary | ICD-10-CM | POA: Diagnosis not present

## 2020-06-30 DIAGNOSIS — Z8249 Family history of ischemic heart disease and other diseases of the circulatory system: Secondary | ICD-10-CM | POA: Diagnosis not present

## 2020-06-30 DIAGNOSIS — M1990 Unspecified osteoarthritis, unspecified site: Secondary | ICD-10-CM | POA: Diagnosis not present

## 2020-06-30 DIAGNOSIS — I10 Essential (primary) hypertension: Secondary | ICD-10-CM | POA: Diagnosis not present

## 2020-06-30 DIAGNOSIS — Z791 Long term (current) use of non-steroidal anti-inflammatories (NSAID): Secondary | ICD-10-CM | POA: Diagnosis not present

## 2020-06-30 DIAGNOSIS — Z809 Family history of malignant neoplasm, unspecified: Secondary | ICD-10-CM | POA: Diagnosis not present

## 2020-06-30 DIAGNOSIS — G8929 Other chronic pain: Secondary | ICD-10-CM | POA: Diagnosis not present

## 2020-07-18 DIAGNOSIS — R69 Illness, unspecified: Secondary | ICD-10-CM | POA: Diagnosis not present

## 2020-07-27 ENCOUNTER — Telehealth: Payer: Self-pay

## 2020-07-27 NOTE — Progress Notes (Addendum)
    Chronic Care Management Pharmacy Assistant   Name: Hector Edwards  MRN: [id number] DOB: 1946/01/03  Reason for Encounter: Medication Review   PCP : Brunetta Jeans, PA-C  Allergies:   Allergies  Allergen Reactions   Enalapril Maleate     REACTION: cramps   Hctz [Hydrochlorothiazide]     SOB   Tribenzor [Olmesartan-Amlodipine-Hctz] Swelling    Fatigue, CP, edema   Verapamil     REACTION: Fatigue    Medications: Outpatient Encounter Medications as of 07/27/2020  Medication Sig   benazepril (LOTENSIN) 20 MG tablet Take 2 tablets (40 mg total) by mouth daily.   meloxicam (MOBIC) 15 MG tablet Take 15 mg by mouth daily. Take one tablet by mouth once daily   oxyCODONE (OXY IR/ROXICODONE) 5 MG immediate release tablet Take 1 tablet (5 mg total) by mouth every 4 (four) hours as needed for moderate pain.   pantoprazole (PROTONIX) 40 MG tablet Take 1 tablet (40 mg total) by mouth every evening.   UNABLE TO FIND Med Name: vitamin b 1000 bid   No facility-administered encounter medications on file as of 07/27/2020.    Current Diagnosis: Patient Active Problem List   Diagnosis Date Noted   OSA (obstructive sleep apnea) 08/01/2018   Benign prostatic hyperplasia with nocturia 11/26/2016   Prostate cancer screening 11/26/2016   Medicare annual wellness visit, subsequent 11/26/2016   Hx of adenomatous colonic polyps 09/04/2015   Annual physical exam 07/20/2015   Visit for preventive health examination 05/04/2013   Erectile dysfunction 05/04/2013   Snoring 02/13/2012   GERD (gastroesophageal reflux disease) 08/12/2011   Osteoarthritis 08/08/2011   Hyperlipidemia 10/13/2007   Essential hypertension 10/13/2007     Reviewed chart for medication changes ahead of medication coordination call.  No OVs, Consults, or hospital visits since last care coordination call/Pharmacist visit. (If appropriate, list visit date, provider name)  No medication changes indicated OR if recent  visit, treatment plan here.  BP Readings from Last 3 Encounters:  01/26/20 130/73  10/19/19 (!) 146/76  09/28/19 132/82    Lab Results  Component Value Date   HGBA1C 5.8 (H) 06/16/2019     Patient obtains medications through [facility]  90 Days   Patient is due for next adherence delivery on: 08-05-20 Called patient and reviewed medications and coordinated delivery.  This delivery to include: Pantoprazole 40mg  Tablet Take one tab by mouth every evening Benazepril 20mg  Tab Take two tablets by mouth every day at bedtime (CPP to Request) Vitamin B-12 1,000 Mcg Tab Take on Tablet by mouth every day at bedtime  (otc)     Patient needs refills for  Benazepril 20mg  Tab Take two tablets by mouth every day at bedtime (CPP to Request) Vitamin B-12 1,000 Mcg Tab Take on Tablet by mouth every day at bedtime  (otc)    Confirmed delivery date of 08-05-2020, advised patient that pharmacy will contact them the morning of delivery.    Georgiana Shore ,[place] Pharmacist Assistant [phone]   Follow-Up:  Pharmacist Review  Reviewed, refill requested

## 2020-08-03 DIAGNOSIS — R69 Illness, unspecified: Secondary | ICD-10-CM | POA: Diagnosis not present

## 2020-08-05 ENCOUNTER — Telehealth: Payer: Self-pay | Admitting: Emergency Medicine

## 2020-08-05 DIAGNOSIS — I10 Essential (primary) hypertension: Secondary | ICD-10-CM

## 2020-08-05 MED ORDER — BENAZEPRIL HCL 20 MG PO TABS: 40.0000 mg | ORAL_TABLET | Freq: Every day | ORAL | 1 refills | Status: DC

## 2020-08-05 NOTE — Telephone Encounter (Signed)
-----   Message from Davis Gourd, [place] sent at 08/05/2020 10:06 AM EDT ----- Regarding: FW: benazepril refill  ----- Message ----- From: Madelin Rear, [facility] Sent: 08/05/2020   9:56 AM EDT To: Davis Gourd, CMA Subject: benazepril refill                              Hi Jessica, could you please send refills [facility] when you have a chance?  Thank you, [name]

## 2020-08-05 NOTE — Telephone Encounter (Signed)
Benazapril refill sent to [facility]

## 2020-09-20 ENCOUNTER — Other Ambulatory Visit: Payer: Self-pay

## 2020-09-20 ENCOUNTER — Telehealth (INDEPENDENT_AMBULATORY_CARE_PROVIDER_SITE_OTHER): Payer: Medicare HMO | Admitting: Physician Assistant

## 2020-09-20 ENCOUNTER — Encounter: Payer: Self-pay | Admitting: Physician Assistant

## 2020-09-20 ENCOUNTER — Other Ambulatory Visit: Payer: Medicare HMO

## 2020-09-20 DIAGNOSIS — J069 Acute upper respiratory infection, unspecified: Secondary | ICD-10-CM | POA: Diagnosis not present

## 2020-09-20 DIAGNOSIS — Z20822 Contact with and (suspected) exposure to covid-19: Secondary | ICD-10-CM

## 2020-09-20 NOTE — Progress Notes (Signed)
   Virtual Visit via Video   I connected with patient on 09/20/20 at  2:30 PM EST by a video enabled telemedicine application and verified that I am speaking with the correct person using two identifiers.  Location patient: Home Location provider: [facility] Office Persons participating in the virtual visit: Patient, Provider, [facility] (Patina Moore)  I discussed the limitations of evaluation and management by telemedicine and the availability of in person appointments. The patient expressed understanding and agreed to proceed.  Subjective:   HPI:   Patient presents via [facility] today complaining of a couple days of chest congestion with nasal congestion, drainage and productive cough. Denies fever or chills. Denies loss of taste or smell. Denies chest pain. Very slight wheezing noted mainly at nighttime. Is taking Alka-Seltzer cold. Denies recent travel or sick contact.  ROS:   See pertinent positives and negatives per HPI.  Patient Active Problem List   Diagnosis Date Noted  . OSA (obstructive sleep apnea) 08/01/2018  . Benign prostatic hyperplasia with nocturia 11/26/2016  . Prostate cancer screening 11/26/2016  . Medicare annual wellness visit, subsequent 11/26/2016  . Hx of adenomatous colonic polyps 09/04/2015  . Annual physical exam 07/20/2015  . Visit for preventive health examination 05/04/2013  . Erectile dysfunction 05/04/2013  . Snoring 02/13/2012  . GERD (gastroesophageal reflux disease) 08/12/2011  . Osteoarthritis 08/08/2011  . Hyperlipidemia 10/13/2007  . Essential hypertension 10/13/2007    Social History   Tobacco Use  . Smoking status: Never Smoker  . Smokeless tobacco: Never Used  Substance Use Topics  . Alcohol use: Yes    Alcohol/week: 4.0 standard drinks    Types: 4 Cans of beer per week    Comment: 4 cans/week    Current Outpatient Medications:  .  benazepril (LOTENSIN) 20 MG tablet, Take 2 tablets (40 mg total) by mouth daily., Disp:  180 tablet, Rfl: 1 .  meloxicam (MOBIC) 15 MG tablet, Take 15 mg by mouth daily. Take one tablet by mouth once daily, Disp: , Rfl:  .  pantoprazole (PROTONIX) 40 MG tablet, Take 1 tablet (40 mg total) by mouth every evening., Disp: 90 tablet, Rfl: 1 .  UNABLE TO FIND, Med Name: vitamin b 1000 bid, Disp: , Rfl:   Allergies  Allergen Reactions  . Enalapril Maleate     REACTION: cramps  . Hctz [Hydrochlorothiazide]     SOB  . Tribenzor [Olmesartan-Amlodipine-Hctz] Swelling    Fatigue, CP, edema  . Verapamil     REACTION: Fatigue    Objective:   There were no vitals taken for this visit.  Patient is well-developed, well-nourished in no acute distress.  Resting comfortably at home.  Head is normocephalic, atraumatic.  No labored breathing.  Speech is clear and coherent with logical content.  Patient is alert and oriented at baseline.    Assessment and Plan:   1. Viral URI with cough Giving age and comorbid conditions, patient sent for Covid testing. He is to quarantine until results are in. Vitamin regimen reviewed. Recommend the take  Mucinex to help thin congestion. Warm liquids and tea recommended to help soothe throat. Saline nasal rinse for drainage. If Covid positive will set up for monoclonal antibody infusion. If negative will monitor for symptom improvement. If not improving may need to consider further evaluation or trial of antibiotic as he is at higher risk for bacterial bronchitis/pneumonia.    [name], PA-C 09/20/2020

## 2020-09-20 NOTE — Progress Notes (Signed)
I have discussed the procedure for the virtual visit with the patient who has given consent to proceed with assessment and treatment.   Hector Edwards Hector Edwards, CMA     

## 2020-09-21 LAB — SPECIMEN STATUS REPORT

## 2020-09-21 LAB — NOVEL CORONAVIRUS, NAA: SARS-CoV-2, NAA: NOT DETECTED

## 2020-09-21 LAB — SARS-COV-2, NAA 2 DAY TAT

## 2020-09-22 ENCOUNTER — Telehealth: Payer: Self-pay

## 2020-09-22 NOTE — Progress Notes (Signed)
    Chronic Care Management Pharmacy Assistant   Name: DIMITRIY CARRERAS  MRN: [id number] DOB: 07/05/1946  Reason for Encounter: Medication Review   PCP : Brunetta Jeans, PA-C  Allergies:   Allergies  Allergen Reactions  . Enalapril Maleate     REACTION: cramps  . Hctz [Hydrochlorothiazide]     SOB  . Tribenzor [Olmesartan-Amlodipine-Hctz] Swelling    Fatigue, CP, edema  . Verapamil     REACTION: Fatigue    Medications: Outpatient Encounter Medications as of 09/22/2020  Medication Sig  . benazepril (LOTENSIN) 20 MG tablet Take 2 tablets (40 mg total) by mouth daily.  . meloxicam (MOBIC) 15 MG tablet Take 15 mg by mouth daily. Take one tablet by mouth once daily  . pantoprazole (PROTONIX) 40 MG tablet Take 1 tablet (40 mg total) by mouth every evening.  Marland Kitchen UNABLE TO FIND Med Name: vitamin b 1000 bid   No facility-administered encounter medications on file as of 09/22/2020.    Current Diagnosis: Patient Active Problem List   Diagnosis Date Noted  . OSA (obstructive sleep apnea) 08/01/2018  . Benign prostatic hyperplasia with nocturia 11/26/2016  . Prostate cancer screening 11/26/2016  . Medicare annual wellness visit, subsequent 11/26/2016  . Hx of adenomatous colonic polyps 09/04/2015  . Annual physical exam 07/20/2015  . Visit for preventive health examination 05/04/2013  . Erectile dysfunction 05/04/2013  . Snoring 02/13/2012  . GERD (gastroesophageal reflux disease) 08/12/2011  . Osteoarthritis 08/08/2011  . Hyperlipidemia 10/13/2007  . Essential hypertension 10/13/2007   Reviewed chart for medication changes ahead of medication coordination call.  No OVs, Consults, or hospital visits since last care coordination call/Pharmacist visit. (If appropriate, list visit date, provider name)  No medication changes indicated OR if recent visit, treatment plan here.  BP Readings from Last 3 Encounters:  01/26/20 130/73  10/19/19 (!) 146/76  09/28/19 132/82     Lab Results  Component Value Date   HGBA1C 5.8 (H) 06/16/2019     Patient obtains medications through Adherence Packaging  90 Days   Last adherence delivery included:  Vitamin B-12 1,000 mcg Benzapril 20mg  Tab Pantaprozole 40mg     Patient is due for next adherence delivery on: 10-05-20 Called patient and reviewed medications. Patient stated he is up to date on his medications and not needing a delivery at this time.  Georgiana Shore ,[place] Pharmacist Assistant [phone]   Follow-Up:  Pharmacist Review

## 2020-10-17 ENCOUNTER — Telehealth: Payer: Medicare HMO

## 2020-10-20 ENCOUNTER — Ambulatory Visit: Payer: Medicare HMO

## 2020-10-20 DIAGNOSIS — E785 Hyperlipidemia, unspecified: Secondary | ICD-10-CM

## 2020-10-20 DIAGNOSIS — I10 Essential (primary) hypertension: Secondary | ICD-10-CM

## 2020-10-20 NOTE — Progress Notes (Signed)
Chronic Care Management Pharmacy Name: [facility]     MRN: [id number]     DOB: [birth year]  Chief Complaint/ HPI Hector Edwards, 75 y.o., male, presents for their follow-up CCM visit with the clinical pharmacist via telephone due to COVID-19 pandemic.  PCP: Hector Jeans, PA-C Encounter Diagnoses  Name Primary?  . Essential hypertension Yes  . Hyperlipidemia, unspecified hyperlipidemia type      Patient Active Problem List   Diagnosis Date Noted  . OSA (obstructive sleep apnea) 08/01/2018  . Benign prostatic hyperplasia with nocturia 11/26/2016  . Prostate cancer screening 11/26/2016  . Medicare annual wellness visit, subsequent 11/26/2016  . Hx of adenomatous colonic polyps 09/04/2015  . Annual physical exam 07/20/2015  . Visit for preventive health examination 05/04/2013  . Erectile dysfunction 05/04/2013  . Snoring 02/13/2012  . GERD (gastroesophageal reflux disease) 08/12/2011  . Osteoarthritis 08/08/2011  . Hyperlipidemia 10/13/2007  . Essential hypertension 10/13/2007   Past Surgical History:  Procedure Laterality Date  . COLONOSCOPY  2004, 2021   hyperplastic polyps x 2  . KNEE ARTHROSCOPY Right   . PENILE PROSTHESIS IMPLANT N/A 01/25/2020   Procedure: PENILE PROTHESIS INFLATABLE COLOPLAST;  Surgeon: Lucas Mallow, MD;  Location: [facility];  Service: Urology;  Laterality: N/A;  . SHOULDER ARTHROSCOPY W/ ROTATOR CUFF REPAIR Left   . TONSILLECTOMY  as child  . vocal cord growth removed  age 74   benign   Family History  Problem Relation Age of Onset  . Breast cancer Daughter   . Hypertension Father   . Heart disease Father   . Colon polyps Father   . Stroke Mother   . Depression Mother   . Hypertension Other   . Pancreatic cancer Brother   . Colon polyps Brother   . Throat cancer Brother   . Colon cancer Neg Hx   . Esophageal cancer Neg Hx   . Stomach cancer Neg Hx   . Rectal cancer Neg Hx    Social History   Social  History Narrative   Regular exercise- yes, seldom; working physically all the time.   Allergies  Allergen Reactions  . Enalapril Maleate     REACTION: cramps  . Hctz [Hydrochlorothiazide]     SOB  . Tribenzor [Olmesartan-Amlodipine-Hctz] Swelling    Fatigue, CP, edema  . Verapamil     REACTION: Fatigue   Outpatient Encounter Medications as of 10/20/2020  Medication Sig  . benazepril (LOTENSIN) 20 MG tablet Take 2 tablets (40 mg total) by mouth daily.  . meloxicam (MOBIC) 15 MG tablet Take 15 mg by mouth daily. Take one tablet by mouth once daily  . pantoprazole (PROTONIX) 40 MG tablet Take 1 tablet (40 mg total) by mouth every evening.  Marland Kitchen UNABLE TO FIND Med Name: vitamin b 1000 bid   No facility-administered encounter medications on file as of 10/20/2020.   Patient Care Team    Relationship Specialty Notifications Start End  Hector Edwards, [place] PCP - General Physician Assistant  05/04/13   Gatha Mayer, MD Consulting Physician Gastroenterology  06/20/15   Druscilla Brownie, MD Consulting Physician Dermatology  06/20/15   Jettie Booze, MD Consulting Physician Cardiology  12/09/16   Madelin Rear, [facility] Pharmacist Pharmacist  01/04/20    Comment: phone number [phone]   Current Diagnosis/Assessment: Goals Addressed            This Visit's Progress   . PharmD Care Plan   On track  CARE PLAN ENTRY  Current Barriers:  . Chronic Disease Management support, education, and care coordination needs related to GERD, osteoarthritis, HTN and HLD  Pharmacist Clinical Goal(s):  . BP <140/90 . LDL <100 . Minimize GERD symptoms  . Minimize knee pain   Interventions: . Comprehensive medication review performed. . Recommend Voltaren 1% gel - apply 4 grams to knee four times daily (max 16 grams daily)  Patient Self Care Activities:  . Continued exercise at [facility] at least 3x/week for 30 minutes . Reduce intake of fatty foods . Patient verbalizes understanding of plan to  call pharmacist, [name], with any questions.  Initial goal documentation.      Hypertension   BP goal <130/80  BP Readings from Last 3 Encounters:  01/26/20 130/73  10/19/19 (!) 146/76  09/28/19 132/82    BMP Latest Ref Rng & Units 01/25/2020 09/28/2019 06/16/2019  Glucose 70 - 99 mg/dL 137(H) 160(H) 123(H)  BUN 8 - 23 mg/dL 12 12 11   Creatinine 0.61 - 1.24 mg/dL 0.70 0.76 0.83  BUN/Creat Ratio 10 - 24 - - 13  Sodium 135 - 145 mmol/L 139 136 138  Potassium 3.5 - 5.1 mmol/L 4.2 4.0 4.2  Chloride 98 - 111 mmol/L 103 102 97  CO2 19 - 32 mEq/L - 23 22  Calcium 8.4 - 10.5 mg/dL - 8.9 9.6   Previous medications: amlodipine-benazepril 5-20, 10-20 mg once daily. Patient checks BP at home several times per month. Recent home readings: none provided today reports at goal <140/90 at home. Active with [employer] several days per week. No specific diet.  Denies dizziness. Patient is currently at goal on the following medications:  . Benazepril 20 mg tablet - two tablets once daily  Reviewed side effects, scheduled for CPE.  Plan  Continue current medications.  Hyperlipidemia   LDL goal < 100  Lipid Panel     Component Value Date/Time   CHOL 142 09/28/2019 1035   CHOL 170 06/16/2019 1035   CHOL 163 03/06/2018 1043   CHOL 102 06/24/2017 1029   TRIG 90.0 09/28/2019 1035   TRIG 88 06/16/2019 1035   TRIG 76.0 03/06/2018 1043   HDL 23.30 (L) 09/28/2019 1035   HDL 32 (L) 06/16/2019 1035   HDL 33.10 (L) 03/06/2018 1043   HDL 34.60 (L) 06/24/2017 1029   LDLCALC 101 (H) 09/28/2019 1035   LDLCALC 121 (H) 06/16/2019 1035   LDLCALC 115 (H) 03/06/2018 1043   LDLCALC 54 06/24/2017 1029   LDLDIRECT 158.5 10/19/2008 0858    Hepatic Function Latest Ref Rng & Units 09/28/2019 06/16/2019 06/02/2018  Total Protein 6.0 - 8.3 g/dL 7.0 7.5 7.3  Albumin 3.5 - 5.2 g/dL 4.1 4.4 4.5  AST 0 - 37 U/L 36 30 32  ALT 0 - 53 U/L 53 54(H) 52  Alk Phosphatase 39 - 117 U/L 53 66 54  Total Bilirubin 0.2 - 1.2  mg/dL 0.7 0.6 0.6  Bilirubin, Direct 0.0 - 0.3 mg/dL - - -    The 10-year ASCVD risk score Mikey Bussing DC Jr., et al., 2013) is: 28%   Values used to calculate the score:     Age: 52 years     Sex: Male     Is Non-Hispanic African American: No     Diabetic: No     Tobacco smoker: No     Systolic Blood Pressure: 397 mmHg     Is BP treated: Yes     HDL Cholesterol: 23.3 mg/dL     Total  Cholesterol: 142 mg/dL  Previous medications: atorvastatin 10, pravastatin 20 mg. Hx of muscle pain w/ statins.   Based on previous labs, not at goal LDL - CPE scheduled for labs/risk stratification. Current medications:  . n/a  We discussed previous labs, due for annual/updated labs.  Review diet/exercise - Maintain a healthy weight and exercise regularly, as directed by your health care provider. Eat healthy foods, such as: Lean proteins, complex carbohydrates, fresh fruits and vegetables, low-fat dairy products, healthy fats.  Plan  CPE scheduled next week. Consider alternative statin - lower dose rosuvastatin 5-10 mg as appropriate.   Medication Management / Care Coordination   Receives prescription medications from:  [facility] - [place], [place] - [address] [place] [address] Phone: [phone] Fax: [phone]   Denies any issues with pharmacy services at this time.  Plan  Continue current medication management strategy. ___________________________ SDOH (Social Determinants of Health) assessments performed: Yes. Future Appointments  Date Time Provider [place]  10/27/2020  9:30 AM Delorse Limber [facility]  04/20/2021 10:30 AM [facility]CM PHARMACIST [facility]   Visit follow-up:   . CPA follow-up: med synch calls. Marland Kitchen [place] follow-up: 6 month telephone f/u visit.  Madelin Rear, Pharm.D., BCGP Clinical Pharmacist [facility] [phone]

## 2020-10-20 NOTE — Patient Instructions (Signed)
Mr. Hector Edwards,  Thank you for taking the time to review your medications with me today.  I have included our care plan/goals in the following pages. Please review and call me at [phone] with any questions!  Thanks! Ellin Mayhew, Pharm.D., BCGP Clinical Pharmacist [facility] [phone]  Goals Addressed            This Visit's Progress   . PharmD Care Plan   On track    CARE PLAN ENTRY  Current Barriers:  . Chronic Disease Management support, education, and care coordination needs related to GERD, osteoarthritis, HTN and HLD  Pharmacist Clinical Goal(s):  . BP <140/90 . LDL <100 . Minimize GERD symptoms  . Minimize knee pain   Interventions: . Comprehensive medication review performed. . Recommend Voltaren 1% gel - apply 4 grams to knee four times daily (max 16 grams daily)  Patient Self Care Activities:  . Continued exercise at [facility] at least 3x/week for 30 minutes . Reduce intake of fatty foods . Patient verbalizes understanding of plan to call pharmacist, [name], with any questions.  Initial goal documentation.      The patient verbalized understanding of instructions provided today and agreed to receive a mailed copy of patient instruction and/or educational materials. Telephone follow up appointment with pharmacy team member scheduled for: See next appointment with "Care Management Staff" under "What's Next" below.   High Cholesterol  High cholesterol is a condition in which the blood has high levels of a white, waxy substance similar to fat (cholesterol). The liver makes all the cholesterol that the body needs. The human body needs small amounts of cholesterol to help build cells. A person gets extra or excess cholesterol from the food that he or she eats. The blood carries cholesterol from the liver to the rest of the body. If you have high cholesterol, deposits (plaques) may build up on the walls of your arteries.  Arteries are the blood vessels that carry blood away from your heart. These plaques make the arteries narrow and stiff. Cholesterol plaques increase your risk for heart attack and stroke. Work with your health care provider to keep your cholesterol levels in a healthy range. What increases the risk? The following factors may make you more likely to develop this condition:  Eating foods that are high in animal fat (saturated fat) or cholesterol.  Being overweight.  Not getting enough exercise.  A family history of high cholesterol (familial hypercholesterolemia).  Use of tobacco products.  Having diabetes. What are the signs or symptoms? There are no symptoms of this condition. How is this diagnosed? This condition may be diagnosed based on the results of a blood test.  If you are older than 73 years of age, your health care provider may check your cholesterol levels every 4-6 years.  You may be checked more often if you have high cholesterol or other risk factors for heart disease. The blood test for cholesterol measures:  "Bad" cholesterol, or LDL cholesterol. This is the main type of cholesterol that causes heart disease. The desired level is less than 100 mg/dL.  "Good" cholesterol, or HDL cholesterol. HDL helps protect against heart disease by cleaning the arteries and carrying the LDL to the liver for processing. The desired level for HDL is 60 mg/dL or higher.  Triglycerides. These are fats that your body can store or burn for energy. The desired level is less than 150 mg/dL.  Total cholesterol.  This measures the total amount of cholesterol in your blood and includes LDL, HDL, and triglycerides. The desired level is less than 200 mg/dL. How is this treated? This condition may be treated with:  Diet changes. You may be asked to eat foods that have more fiber and less saturated fats or added sugar.  Lifestyle changes. These may include regular exercise, maintaining a healthy  weight, and quitting use of tobacco products.  Medicines. These are given when diet and lifestyle changes have not worked. You may be prescribed a statin medicine to help lower your cholesterol levels. Follow these instructions at home: Eating and drinking  Eat a healthy, balanced diet. This diet includes: ? Daily servings of a variety of fresh, frozen, or canned fruits and vegetables. ? Daily servings of whole grain foods that are rich in fiber. ? Foods that are low in saturated fats and trans fats. These include poultry and fish without skin, lean cuts of meat, and low-fat dairy products. ? A variety of fish, especially oily fish that contain omega-3 fatty acids. Aim to eat fish at least 2 times a week.  Avoid foods and drinks that have added sugar.  Use healthy cooking methods, such as roasting, grilling, broiling, baking, poaching, steaming, and stir-frying. Do not fry your food except for stir-frying.   Lifestyle  Get regular exercise. Aim to exercise for a total of 150 minutes a week. Increase your activity level by doing activities such as gardening, walking, and taking the stairs.  Do not use any products that contain nicotine or tobacco, such as cigarettes, e-cigarettes, and chewing tobacco. If you need help quitting, ask your health care provider.   General instructions  Take over-the-counter and prescription medicines only as told by your health care provider.  Keep all follow-up visits as told by your health care provider. This is important. Where to find more information  American Heart Association: www.heart.org  National Heart, Lung, and Blood Institute: https://wilson-eaton.com/ Contact a health care provider if:  You have trouble achieving or maintaining a healthy diet or weight.  You are starting an exercise program.  You are unable to stop smoking. Get help right away if:  You have chest pain.  You have trouble breathing.  You have any symptoms of a stroke. "BE  FAST" is an easy way to remember the main warning signs of a stroke: ? B - Balance. Signs are dizziness, sudden trouble walking, or loss of balance. ? E - Eyes. Signs are trouble seeing or a sudden change in vision. ? F - Face. Signs are sudden weakness or numbness of the face, or the face or eyelid drooping on one side. ? A - Arms. Signs are weakness or numbness in an arm. This happens suddenly and usually on one side of the body. ? S - Speech. Signs are sudden trouble speaking, slurred speech, or trouble understanding what people say. ? T - Time. Time to call emergency services. Write down what time symptoms started.  You have other signs of a stroke, such as: ? A sudden, severe headache with no known cause. ? Nausea or vomiting. ? Seizure. These symptoms may represent a serious problem that is an emergency. Do not wait to see if the symptoms will go away. Get medical help right away. Call your local emergency services (911 in the [place] Do not drive yourself to the hospital. Summary  Cholesterol plaques increase your risk for heart attack and stroke. Work with your health care provider to keep  your cholesterol levels in a healthy range.  Eat a healthy, balanced diet, get regular exercise, and maintain a healthy weight.  Do not use any products that contain nicotine or tobacco, such as cigarettes, e-cigarettes, and chewing tobacco.  Get help right away if you have any symptoms of a stroke. This information is not intended to replace advice given to you by your health care provider. Make sure you discuss any questions you have with your health care provider. Document Revised: 08/24/2019 Document Reviewed: 08/24/2019 Elsevier Patient Education  2021 [employer].

## 2020-10-26 ENCOUNTER — Other Ambulatory Visit: Payer: Self-pay | Admitting: Physician Assistant

## 2020-10-26 DIAGNOSIS — K219 Gastro-esophageal reflux disease without esophagitis: Secondary | ICD-10-CM

## 2020-10-27 ENCOUNTER — Ambulatory Visit (INDEPENDENT_AMBULATORY_CARE_PROVIDER_SITE_OTHER): Payer: Medicare HMO | Admitting: Physician Assistant

## 2020-10-27 ENCOUNTER — Other Ambulatory Visit: Payer: Self-pay

## 2020-10-27 ENCOUNTER — Encounter: Payer: Self-pay | Admitting: Physician Assistant

## 2020-10-27 VITALS — BP 130/80 | HR 95 | Temp 98.2°F | Resp 16 | Ht 76.0 in | Wt 297.0 lb

## 2020-10-27 DIAGNOSIS — Z0000 Encounter for general adult medical examination without abnormal findings: Secondary | ICD-10-CM | POA: Diagnosis not present

## 2020-10-27 DIAGNOSIS — K219 Gastro-esophageal reflux disease without esophagitis: Secondary | ICD-10-CM

## 2020-10-27 DIAGNOSIS — Z6837 Body mass index (BMI) 37.0-37.9, adult: Secondary | ICD-10-CM | POA: Diagnosis not present

## 2020-10-27 DIAGNOSIS — E785 Hyperlipidemia, unspecified: Secondary | ICD-10-CM

## 2020-10-27 DIAGNOSIS — R432 Parageusia: Secondary | ICD-10-CM | POA: Diagnosis not present

## 2020-10-27 DIAGNOSIS — Z125 Encounter for screening for malignant neoplasm of prostate: Secondary | ICD-10-CM | POA: Diagnosis not present

## 2020-10-27 DIAGNOSIS — I10 Essential (primary) hypertension: Secondary | ICD-10-CM

## 2020-10-27 LAB — CBC WITH DIFFERENTIAL/PLATELET
Basophils Absolute: 0 10*3/uL (ref 0.0–0.1)
Basophils Relative: 0.6 % (ref 0.0–3.0)
Eosinophils Absolute: 0.1 10*3/uL (ref 0.0–0.7)
Eosinophils Relative: 1.1 % (ref 0.0–5.0)
HCT: 45.8 % (ref 39.0–52.0)
Hemoglobin: 16 g/dL (ref 13.0–17.0)
Lymphocytes Relative: 45.6 % (ref 12.0–46.0)
Lymphs Abs: 2.5 10*3/uL (ref 0.7–4.0)
MCHC: 34.9 g/dL (ref 30.0–36.0)
MCV: 94.2 fl (ref 78.0–100.0)
Monocytes Absolute: 0.7 10*3/uL (ref 0.1–1.0)
Monocytes Relative: 12.4 % — ABNORMAL HIGH (ref 3.0–12.0)
Neutro Abs: 2.3 10*3/uL (ref 1.4–7.7)
Neutrophils Relative %: 40.3 % — ABNORMAL LOW (ref 43.0–77.0)
Platelets: 208 10*3/uL (ref 150.0–400.0)
RBC: 4.85 Mil/uL (ref 4.22–5.81)
RDW: 13.9 % (ref 11.5–15.5)
WBC: 5.6 10*3/uL (ref 4.0–10.5)

## 2020-10-27 LAB — COMPREHENSIVE METABOLIC PANEL
ALT: 57 U/L — ABNORMAL HIGH (ref 0–53)
AST: 43 U/L — ABNORMAL HIGH (ref 0–37)
Albumin: 4.5 g/dL (ref 3.5–5.2)
Alkaline Phosphatase: 59 U/L (ref 39–117)
BUN: 11 mg/dL (ref 6–23)
CO2: 26 mEq/L (ref 19–32)
Calcium: 9.9 mg/dL (ref 8.4–10.5)
Chloride: 103 mEq/L (ref 96–112)
Creatinine, Ser: 0.93 mg/dL (ref 0.40–1.50)
GFR: 82.18 mL/min (ref >60.00)
Glucose, Bld: 115 mg/dL — ABNORMAL HIGH (ref 70–99)
Potassium: 4.4 mEq/L (ref 3.5–5.1)
Sodium: 136 mEq/L (ref 135–145)
Total Bilirubin: 1 mg/dL (ref 0.2–1.2)
Total Protein: 7.7 g/dL (ref 6.0–8.3)

## 2020-10-27 LAB — LIPID PANEL
Cholesterol: 183 mg/dL (ref 0–200)
HDL: 29.5 mg/dL — ABNORMAL LOW (ref >39.00)
LDL Cholesterol: 132 mg/dL — ABNORMAL HIGH (ref 0–99)
NonHDL: 153.75
Total CHOL/HDL Ratio: 6
Triglycerides: 111 mg/dL (ref 0.0–149.0)
VLDL: 22.2 mg/dL (ref 0.0–40.0)

## 2020-10-27 LAB — PSA, MEDICARE: PSA: 1.81 ng/ml (ref 0.10–4.00)

## 2020-10-27 NOTE — Progress Notes (Signed)
Patient presents to clinic today for annual exam.  Patient is fasting for labs.  Acute Concerns: Patient has noted 3+ months of decreased taste.  Denies any associated decrease in smell.  Does have chronic nasal congestion.  Denies any known history of COVID infection.  Denies sore throat, difficulty swallowing.  Denies any sinus pressure, sinus pain, ear pain or tooth pain.  Is wanting to see about a further assessment.  Health Maintenance: Immunizations --notes having flu shot and COVID booster.  Unable to obtain date of COVID booster.  He is to find his card and bring it in. Colon Cancer Screening --up-to-date   Past Medical History:  Diagnosis Date  . Depression   . GERD (gastroesophageal reflux disease)   . Hx of adenomatous colonic polyps 09/04/2015  . Hyperlipidemia   . Hypertension   . LBP (low back pain)   . OSA (obstructive sleep apnea) 08/01/2018  . Sleep apnea    wears cpap     Past Surgical History:  Procedure Laterality Date  . COLONOSCOPY  2004, 2021   hyperplastic polyps x 2  . KNEE ARTHROSCOPY Right   . PENILE PROSTHESIS IMPLANT N/A 01/25/2020   Procedure: PENILE PROTHESIS INFLATABLE COLOPLAST;  Surgeon: Lucas Mallow, MD;  Location: [facility];  Service: Urology;  Laterality: N/A;  . SHOULDER ARTHROSCOPY W/ ROTATOR CUFF REPAIR Left   . TONSILLECTOMY  as child  . vocal cord growth removed  age 37   benign  . WISDOM TOOTH EXTRACTION      Current Outpatient Medications on File Prior to Visit  Medication Sig Dispense Refill  . benazepril (LOTENSIN) 20 MG tablet Take 2 tablets (40 mg total) by mouth daily. 180 tablet 1  . meloxicam (MOBIC) 15 MG tablet Take 15 mg by mouth daily. Take one tablet by mouth once daily    . pantoprazole (PROTONIX) 40 MG tablet TAKE ONE TABLET BY MOUTH EVERY EVENING 90 tablet 1  . UNABLE TO FIND Med Name: vitamin b 1000 bid     No current facility-administered medications on file prior to visit.     Allergies  Allergen Reactions  . Enalapril Maleate     REACTION: cramps  . Hctz [Hydrochlorothiazide]     SOB  . Tribenzor [Olmesartan-Amlodipine-Hctz] Swelling    Fatigue, CP, edema  . Verapamil     REACTION: Fatigue    Family History  Problem Relation Age of Onset  . Breast cancer Daughter   . Hypertension Father   . Heart disease Father   . Colon polyps Father   . Stroke Mother   . Depression Mother   . Hypertension Other   . Pancreatic cancer Brother   . Colon polyps Brother   . Throat cancer Brother   . Colon cancer Neg Hx   . Esophageal cancer Neg Hx   . Stomach cancer Neg Hx   . Rectal cancer Neg Hx     Social History   Socioeconomic History  . Marital status: Divorced    Spouse name: Not on file  . Number of children: Not on file  . Years of education: Not on file  . Highest education level: Not on file  Occupational History  . Occupation: Retired  Tobacco Use  . Smoking status: Never Smoker  . Smokeless tobacco: Never Used  Vaping Use  . Vaping Use: Never used  Substance and Sexual Activity  . Alcohol use: Yes    Alcohol/week: 4.0 standard drinks  Types: 4 Cans of beer per week    Comment: 4 cans/week  . Drug use: No  . Sexual activity: Not Currently  Other Topics Concern  . Not on file  Social History Narrative   Regular exercise- yes, seldom; working physically all the time.   Social Determinants of Health   Financial Resource Strain: Not on file  Food Insecurity: Not on file  Transportation Needs: Not on file  Physical Activity: Not on file  Stress: Not on file  Social Connections: Not on file  Intimate Partner Violence: Not on file   Review of Systems  Constitutional: Negative for fever and weight loss.  HENT: Negative for ear discharge, ear pain, hearing loss and tinnitus.        Decreased taste (chronic)  Eyes: Negative for blurred vision, double vision, photophobia and pain.  Respiratory: Negative for cough and shortness  of breath.   Cardiovascular: Negative for chest pain and palpitations.  Gastrointestinal: Negative for abdominal pain, blood in stool, constipation, diarrhea, heartburn, melena, nausea and vomiting.  Genitourinary: Negative for dysuria, flank pain, frequency, hematuria and urgency.  Musculoskeletal: Negative for falls.  Neurological: Negative for dizziness, loss of consciousness and headaches.  Endo/Heme/Allergies: Negative for environmental allergies.  Psychiatric/Behavioral: Negative for depression, hallucinations, substance abuse and suicidal ideas. The patient is not nervous/anxious and does not have insomnia.      BP 130/80   Pulse 95   Temp 98.2 F (36.8 C) (Temporal)   Resp 16   Ht 6\' 4"  (1.93 m)   Wt 297 lb (134.7 kg)   SpO2 97%   BMI 36.15 kg/m   Physical Exam Vitals reviewed.  Constitutional:      Appearance: Normal appearance.  HENT:     Head: Normocephalic and atraumatic.     Right Ear: Tympanic membrane normal. There is no impacted cerumen.     Left Ear: Tympanic membrane normal. There is no impacted cerumen.     Nose: Nose normal.  Eyes:     Conjunctiva/sclera: Conjunctivae normal.     Pupils: Pupils are equal, round, and reactive to light.  Cardiovascular:     Rate and Rhythm: Normal rate and regular rhythm.     Pulses: Normal pulses.     Heart sounds: Normal heart sounds.  Pulmonary:     Effort: Pulmonary effort is normal.     Breath sounds: Normal breath sounds.  Abdominal:     General: Bowel sounds are normal. There is no distension.     Palpations: Abdomen is soft. There is no mass.     Tenderness: There is no abdominal tenderness.     Hernia: No hernia is present.  Musculoskeletal:     Cervical back: Neck supple.  Neurological:     General: No focal deficit present.     Mental Status: He is alert and oriented to person, place, and time.  Psychiatric:        Mood and Affect: Mood normal.     Recent Results (from the past 2160 hour(s))  Novel  Coronavirus, NAA ([facility])     Status: None   Collection Time: 09/20/20 10:33 AM   Specimen: Nasopharyngeal(NP) swabs in vial transport medium   Nasopharynge  Screenin  Result Value Ref Range   SARS-CoV-2, NAA Not Detected Not Detected    Comment: This nucleic acid amplification test was developed and its performance characteristics determined by [employer]. Nucleic acid amplification tests include RT-PCR and TMA. This test has not been FDA cleared or  approved. This test has been authorized by FDA under an Emergency Use Authorization (EUA). This test is only authorized for the duration of time the declaration that circumstances exist justifying the authorization of the emergency use of in vitro diagnostic tests for detection of SARS-CoV-2 virus and/or diagnosis of COVID-19 infection under section 564(b)(1) of the Act, 21 U.S.C. [id number]) (1), unless the authorization is terminated or revoked sooner. When diagnostic testing is negative, the possibility of a false negative result should be considered in the context of a patient's recent exposures and the presence of clinical signs and symptoms consistent with COVID-19. An individual without symptoms of COVID-19 and who is not shedding SARS-CoV-2 virus wo uld expect to have a negative (not detected) result in this assay.   SARS-COV-2, NAA 2 DAY TAT     Status: None   Collection Time: 09/20/20 10:33 AM   Nasopharynge  Screenin  Result Value Ref Range   SARS-CoV-2, NAA 2 DAY TAT Performed   Specimen status report     Status: None   Collection Time: 09/20/20 10:33 AM  Result Value Ref Range   specimen status report Comment     Comment: Please note Please note The date and/or time of collection was not indicated on the requisition as required by state and federal law.  The date of receipt of the specimen was used as the collection date if not supplied.    Assessment/Plan: 1. Visit for preventive health  examination Depression screen negative. Health Maintenance reviewed. Preventive schedule discussed and handout given in AVS. Will obtain fasting labs today.  - CBC with Differential/Platelet  2. Class 2 severe obesity with serious comorbidity and body mass index (BMI) of 37.0 to 37.9 in adult, unspecified obesity type ([place]) Repeat labs today including fasting lipid and glucose.  Dietary and exercise recommendations reviewed with patient. - Comprehensive metabolic panel - Lipid panel  3. Essential hypertension BP stable.  Asymptomatic.  Continue current medication regimen.  Repeat labs today. - Comprehensive metabolic panel  4. Hyperlipidemia, unspecified hyperlipidemia type Is taking medications as directed.  Dietary and exercise recommendations reviewed.  Repeat fasting lipids and LFTs today. - Comprehensive metabolic panel - Lipid panel  5. Gastroesophageal reflux disease without esophagitis Stable.  Continue current medication.  6. Prostate cancer screening Followed by urology for erectile dysfunction and BPH.  Has not been seen in some time.  Will obtain screening PSA level. - PSA, Medicare  7. Decreased sense of taste Ongoing.  Referral to ENT for further assessment. - Ambulatory referral to ENT    This visit occurred during the SARS-CoV-2 public health emergency.  Safety protocols were in place, including screening questions prior to the visit, additional usage of staff PPE, and extensive cleaning of exam room while observing appropriate contact time as indicated for disinfecting solutions.    [name], PA-C

## 2020-10-27 NOTE — Patient Instructions (Signed)
Please go to the lab for blood work.   Our office will call you with your results unless you have chosen to receive results via MyChart.  If your blood work is normal we will follow-up each year for physicals and as scheduled for chronic medical problems.  If anything is abnormal we will treat accordingly and get you in for a follow-up.  I am getting you set up with ENT for ongoing issue with taste.. You should be contacted within 10 days. If not, please let us know.    Preventive Care 19 Years and Older, Male Preventive care refers to lifestyle choices and visits with your health care provider that can promote health and wellness. This includes:  A yearly physical exam. This is also called an annual wellness visit.  Regular dental and eye exams.  Immunizations.  Screening for certain conditions.  Healthy lifestyle choices, such as: ? Eating a healthy diet. ? Getting regular exercise. ? Not using drugs or products that contain nicotine and tobacco. ? Limiting alcohol use. What can I expect for my preventive care visit? Physical exam Your health care provider will check your:  Height and weight. These may be used to calculate your BMI (body mass index). BMI is a measurement that tells if you are at a healthy weight.  Heart rate and blood pressure.  Body temperature.  Skin for abnormal spots. Counseling Your health care provider may ask you questions about your:  Past medical problems.  Family's medical history.  Alcohol, tobacco, and drug use.  Emotional well-being.  Home life and relationship well-being.  Sexual activity.  Diet, exercise, and sleep habits.  History of falls.  Memory and ability to understand (cognition).  Work and work Statistician.  Access to firearms. What immunizations do I need? Vaccines are usually given at various ages, according to a schedule. Your health care provider will recommend vaccines for you based on your age 73, medical  history, and lifestyle or other factors, such as travel or where you work.   What tests do I need? Blood tests  Lipid and cholesterol levels. These may be checked every 5 years, or more often depending on your overall health.  Hepatitis C test.  Hepatitis B test. Screening  Lung cancer screening. You may have this screening every year starting at age 73 if you have a 30-pack-year history of smoking and currently smoke or have quit within the past 15 years.  Colorectal cancer screening. ? All adults should have this screening starting at age 73 and continuing until age 73. ? Your health care provider may recommend screening at age 73 if you are at increased risk. ? You will have tests every 1-10 years, depending on your results and the type of screening test.  Prostate cancer screening. Recommendations will vary depending on your family history and other risks.  Genital exam to check for testicular cancer or hernias.  Diabetes screening. ? This is done by checking your blood sugar (glucose) after you have not eaten for a while (fasting). ? You may have this done every 1-3 years.  Abdominal aortic aneurysm (AAA) screening. You may need this if you are a current or former smoker.  STD (sexually transmitted disease) testing, if you are at risk. Follow these instructions at home: Eating and drinking  Eat a diet that includes fresh fruits and vegetables, whole grains, lean protein, and low-fat dairy products. Limit your intake of foods with high amounts of sugar, saturated fats, and salt.  Take vitamin  and mineral supplements as recommended by your health care provider.  Do not drink alcohol if your health care provider tells you not to drink.  If you drink alcohol: ? Limit how much you have to 0-2 drinks a day. ? Be aware of how much alcohol is in your drink. In the [place] one drink equals one 12 oz bottle of beer (355 mL), one 5 oz glass of wine (148 mL), or one 1 oz glass of hard  liquor (44 mL).   Lifestyle  Take daily care of your teeth and gums. Brush your teeth every morning and night with fluoride toothpaste. Floss one time each day.  Stay active. Exercise for at least 30 minutes 5 or more days each week.  Do not use any products that contain nicotine or tobacco, such as cigarettes, e-cigarettes, and chewing tobacco. If you need help quitting, ask your health care provider.  Do not use drugs.  If you are sexually active, practice safe sex. Use a condom or other form of protection to prevent STIs (sexually transmitted infections).  Talk with your health care provider about taking a low-dose aspirin or statin.  Find healthy ways to cope with stress, such as: ? Meditation, yoga, or listening to music. ? Journaling. ? Talking to a trusted person. ? Spending time with friends and family. Safety  Always wear your seat belt while driving or riding in a vehicle.  Do not drive: ? If you have been drinking alcohol. Do not ride with someone who has been drinking. ? When you are tired or distracted. ? While texting.  Wear a helmet and other protective equipment during sports activities.  If you have firearms in your house, make sure you follow all gun safety procedures. What's next?  Visit your health care provider once a year for an annual wellness visit.  Ask your health care provider how often you should have your eyes and teeth checked.  Stay up to date on all vaccines. This information is not intended to replace advice given to you by your health care provider. Make sure you discuss any questions you have with your health care provider. Document Revised: 06/23/2019 Document Reviewed: 09/18/2018 Elsevier Patient Education  2021 [employer].

## 2020-10-28 ENCOUNTER — Other Ambulatory Visit (INDEPENDENT_AMBULATORY_CARE_PROVIDER_SITE_OTHER): Payer: Medicare HMO

## 2020-10-28 DIAGNOSIS — R7309 Other abnormal glucose: Secondary | ICD-10-CM | POA: Diagnosis not present

## 2020-10-28 LAB — HEMOGLOBIN A1C: Hgb A1c MFr Bld: 6 % (ref 4.6–6.5)

## 2020-11-01 ENCOUNTER — Telehealth: Payer: Self-pay

## 2020-11-01 NOTE — Progress Notes (Signed)
    Chronic Care Management Pharmacy Assistant   Name: Hector Edwards  MRN: [id number] DOB: 12/09/1945  Reason for Encounter: Medication Review  PCP : Brunetta Jeans, PA-C  Allergies:   Allergies  Allergen Reactions  . Enalapril Maleate     REACTION: cramps  . Hctz [Hydrochlorothiazide]     SOB  . Tribenzor [Olmesartan-Amlodipine-Hctz] Swelling    Fatigue, CP, edema  . Verapamil     REACTION: Fatigue    Medications: Outpatient Encounter Medications as of 11/01/2020  Medication Sig  . benazepril (LOTENSIN) 20 MG tablet Take 2 tablets (40 mg total) by mouth daily.  . meloxicam (MOBIC) 15 MG tablet Take 15 mg by mouth daily. Take one tablet by mouth once daily  . pantoprazole (PROTONIX) 40 MG tablet TAKE ONE TABLET BY MOUTH EVERY EVENING  . UNABLE TO FIND Med Name: vitamin b 1000 bid   No facility-administered encounter medications on file as of 11/01/2020.    Current Diagnosis: Patient Active Problem List   Diagnosis Date Noted  . Class 2 severe obesity with serious comorbidity and body mass index (BMI) of 37.0 to 37.9 in adult, unspecified obesity type ([place]) 10/27/2020  . OSA (obstructive sleep apnea) 08/01/2018  . Benign prostatic hyperplasia with nocturia 11/26/2016  . Prostate cancer screening 11/26/2016  . Medicare annual wellness visit, subsequent 11/26/2016  . Hx of adenomatous colonic polyps 09/04/2015  . Annual physical exam 07/20/2015  . Visit for preventive health examination 05/04/2013  . Erectile dysfunction 05/04/2013  . Snoring 02/13/2012  . GERD (gastroesophageal reflux disease) 08/12/2011  . Osteoarthritis 08/08/2011  . Hyperlipidemia 10/13/2007  . Essential hypertension 10/13/2007    Reviewed chart for medication changes ahead of medication coordination call.  No OVs, Consults, or hospital visits since last care coordination call/Pharmacist visit. (If appropriate, list visit date, provider name)  No medication changes indicated OR if recent  visit, treatment plan here.  BP Readings from Last 3 Encounters:  10/27/20 130/80  01/26/20 130/73  10/19/19 (!) 146/76    Lab Results  Component Value Date   HGBA1C 6.0 10/28/2020     Patient obtains medications through Adherence Packaging  90 Days   Patient is due for next adherence delivery on: 11-04-20. Called patient and reviewed medications and coordinated delivery.  This delivery to include: Pantoprazole 40mg  Tablet Take one tab by mouth every evening Benazepril 20mg  Tab Take two tablets by mouth every day at bedtime  Vitamin B-12 1,000 Mcg Tab Take on Tablet by mouth every day at bedtime     Confirmed delivery date of 11-04-20, advised patient that pharmacy will contact them the morning of delivery.  Georgiana Shore ,[place] Pharmacist Assistant [phone]  Follow-Up:  Pharmacist Review

## 2020-11-14 ENCOUNTER — Ambulatory Visit (INDEPENDENT_AMBULATORY_CARE_PROVIDER_SITE_OTHER): Payer: Medicare HMO | Admitting: Otolaryngology

## 2020-11-14 ENCOUNTER — Other Ambulatory Visit: Payer: Self-pay

## 2020-11-14 ENCOUNTER — Encounter (INDEPENDENT_AMBULATORY_CARE_PROVIDER_SITE_OTHER): Payer: Self-pay | Admitting: Otolaryngology

## 2020-11-14 VITALS — Temp 96.6°F

## 2020-11-14 DIAGNOSIS — H6502 Acute serous otitis media, left ear: Secondary | ICD-10-CM

## 2020-11-14 DIAGNOSIS — J310 Chronic rhinitis: Secondary | ICD-10-CM

## 2020-11-14 DIAGNOSIS — R438 Other disturbances of smell and taste: Secondary | ICD-10-CM | POA: Diagnosis not present

## 2020-11-14 NOTE — Progress Notes (Signed)
HPI: Hector Edwards is a 75 y.o. male who presents is referred by his PCP for evaluation of loss of sense of taste.  He states that he had his wisdom teeth removed a couple months ago and since having his wisdom teeth removed he has loss some of his taste and states that everything tastes bland or "blahh".  He denies having experienced Covid. He has difficulty breathing through his nose.  He has more of a mouth breather. He has hearing loss and just ordered hearing aids..  Past Medical History:  Diagnosis Date  . Depression   . GERD (gastroesophageal reflux disease)   . Hx of adenomatous colonic polyps 09/04/2015  . Hyperlipidemia   . Hypertension   . LBP (low back pain)   . OSA (obstructive sleep apnea) 08/01/2018  . Sleep apnea    wears cpap    Past Surgical History:  Procedure Laterality Date  . COLONOSCOPY  2004, 2021   hyperplastic polyps x 2  . KNEE ARTHROSCOPY Right   . PENILE PROSTHESIS IMPLANT N/A 01/25/2020   Procedure: PENILE PROTHESIS INFLATABLE COLOPLAST;  Surgeon: Lucas Mallow, MD;  Location: [facility];  Service: Urology;  Laterality: N/A;  . SHOULDER ARTHROSCOPY W/ ROTATOR CUFF REPAIR Left   . TONSILLECTOMY  as child  . vocal cord growth removed  age 7   benign  . WISDOM TOOTH EXTRACTION     Social History   Socioeconomic History  . Marital status: Divorced    Spouse name: Not on file  . Number of children: Not on file  . Years of education: Not on file  . Highest education level: Not on file  Occupational History  . Occupation: Retired  Tobacco Use  . Smoking status: Never Smoker  . Smokeless tobacco: Never Used  Vaping Use  . Vaping Use: Never used  Substance and Sexual Activity  . Alcohol use: Yes    Alcohol/week: 4.0 standard drinks    Types: 4 Cans of beer per week    Comment: 4 cans/week  . Drug use: No  . Sexual activity: Not Currently  Other Topics Concern  . Not on file  Social History Narrative   Regular  exercise- yes, seldom; working physically all the time.   Social Determinants of Health   Financial Resource Strain: Not on file  Food Insecurity: Not on file  Transportation Needs: Not on file  Physical Activity: Not on file  Stress: Not on file  Social Connections: Not on file   Family History  Problem Relation Age of Onset  . Breast cancer Daughter   . Hypertension Father   . Heart disease Father   . Colon polyps Father   . Stroke Mother   . Depression Mother   . Hypertension Other   . Pancreatic cancer Brother   . Colon polyps Brother   . Throat cancer Brother   . Colon cancer Neg Hx   . Esophageal cancer Neg Hx   . Stomach cancer Neg Hx   . Rectal cancer Neg Hx    Allergies  Allergen Reactions  . Enalapril Maleate     REACTION: cramps  . Hctz [Hydrochlorothiazide]     SOB  . Tribenzor [Olmesartan-Amlodipine-Hctz] Swelling    Fatigue, CP, edema  . Verapamil     REACTION: Fatigue   Prior to Admission medications   Medication Sig Start Date End Date Taking? Authorizing Provider  benazepril (LOTENSIN) 20 MG tablet Take 2 tablets (40 mg total) by  mouth daily. 08/05/20   Brunetta Jeans, PA-C  meloxicam (MOBIC) 15 MG tablet Take 15 mg by mouth daily. Take one tablet by mouth once daily 03/11/20   [provider]  pantoprazole (PROTONIX) 40 MG tablet TAKE ONE TABLET BY MOUTH EVERY EVENING 10/26/20   Brunetta Jeans, PA-C  UNABLE TO FIND Med Name: vitamin b 1000 bid    [provider]     Positive ROS: Otherwise negative  All other systems have been reviewed and were otherwise negative with the exception of those mentioned in the HPI and as above.  Physical Exam: Constitutional: Alert, well-appearing, no acute distress Ears: External ears without lesions or tenderness. Ear canals are clear bilaterally.  Right TM is clear.  Left TM is slightly retracted with a left serous otitis media.  I was able to insufflate some air behind the left TM without  too much difficulty.  On tuning fork testing he had a mild left ear conductive loss and a moderate bilateral sensorineural hearing loss. Nasal: External nose without lesions. Septum is mildly deviated to the left.  He has moderate rhinitis with diffuse mucosal swelling of the mucous membranes.  After decongesting the nose nasopharyngoscopy was performed.  There are no polyps.  Both middle meatus regions were clear.  The nasopharynx was clear.  The left eustachian tube was unobstructed..  Oral: Lips and gums without lesions. Tongue and palate mucosa without lesions. Posterior oropharynx clear.  Oral cavity appeared clear with no evidence of infection.  Tongue is soft to palpation with no tongue lesions noted. Neck: No palpable adenopathy or masses Respiratory: Breathing comfortably  Skin: No facial/neck lesions or rash noted.  Procedures  Assessment: Hypogeusia Chronic rhinitis with septal deviation and nasal obstruction. Left serous otitis media with underlying sensorineural hearing loss in both ears  Plan: Placed him on Nasacort 2 sprays each nostril as this should help with eustachian tube function and help improve the left serous otitis.  Hopefully will also allow him to breathe a bit better through his nose. I discussed with him that there is not a great treatment for loss of taste.  Hopefully this will gradually improve with time.   Radene Journey, MD   CC:

## 2020-11-23 ENCOUNTER — Telehealth: Payer: Self-pay

## 2020-11-23 NOTE — Chronic Care Management (AMB) (Signed)
° ° °  Chronic Care Management Pharmacy Assistant   Name: Hector Edwards  MRN: [id number] DOB: 1946/07/14  Reason for Encounter: Medication Review  PCP : Brunetta Jeans, PA-C  Allergies:   Allergies  Allergen Reactions   Enalapril Maleate     REACTION: cramps   Hctz [Hydrochlorothiazide]     SOB   Tribenzor [Olmesartan-Amlodipine-Hctz] Swelling    Fatigue, CP, edema   Verapamil     REACTION: Fatigue    Medications: Outpatient Encounter Medications as of 11/23/2020  Medication Sig   benazepril (LOTENSIN) 20 MG tablet Take 2 tablets (40 mg total) by mouth daily.   meloxicam (MOBIC) 15 MG tablet Take 15 mg by mouth daily. Take one tablet by mouth once daily   pantoprazole (PROTONIX) 40 MG tablet TAKE ONE TABLET BY MOUTH EVERY EVENING   UNABLE TO FIND Med Name: vitamin b 1000 bid   No facility-administered encounter medications on file as of 11/23/2020.    Current Diagnosis: Patient Active Problem List   Diagnosis Date Noted   Class 2 severe obesity with serious comorbidity and body mass index (BMI) of 37.0 to 37.9 in adult, unspecified obesity type ([place]) 10/27/2020   OSA (obstructive sleep apnea) 08/01/2018   Benign prostatic hyperplasia with nocturia 11/26/2016   Prostate cancer screening 11/26/2016   Medicare annual wellness visit, subsequent 11/26/2016   Hx of adenomatous colonic polyps 09/04/2015   Annual physical exam 07/20/2015   Visit for preventive health examination 05/04/2013   Erectile dysfunction 05/04/2013   Snoring 02/13/2012   GERD (gastroesophageal reflux disease) 08/12/2011   Osteoarthritis 08/08/2011   Hyperlipidemia 10/13/2007   Essential hypertension 10/13/2007    Reviewed chart for medication changes ahead of medication coordination call.  11/14/2020 OV (otolaryngology) Dr. Lucia Gaskins; started nasacort 2 sprays in each nostril  BP Readings from Last 3 Encounters:  10/27/20 130/80  01/26/20 130/73  10/19/19 (!) 146/76     Lab Results  Component Value Date   HGBA1C 6.0 10/28/2020     Patient obtains medications through Adherence Packaging  90 Days   Last adherence delivery included: Pantoprazole 40 mg tablet - one tablet every evening Benazepril 20 mg tablet - two tablets at bedtime Vitamin B12 1,000 mcg tablet - one tablet at bedtime  Patient is due for next adherence delivery on: 12/02/2020. Called patient and reviewed medications..  Patient declined the following medications: Pantoprazole 40 mg tablet - 90 DS filled 11/02/2020 Benazepril 20 mg tablet - 90 DS filled 11/02/2020 Vitamin B12 1,000 mcg tablet - 90 DS filled 11/02/2020  Confirmed no filled needed at this time.  April D Calhoun, [place] Pharmacist Assistant [phone]   Follow-Up:  Pharmacist Review

## 2020-11-25 NOTE — Progress Notes (Signed)
Subjective:   Hector Edwards is a 75 y.o. male who presents for Medicare Annual/Subsequent preventive examination.  I connected with [name] today by telephone and verified that I am speaking with the correct person using two identifiers. Location patient: home Location provider: work Persons participating in the virtual visit: patient, Marine scientist.    I discussed the limitations, risks, security and privacy concerns of performing an evaluation and management service by telephone and the availability of in person appointments. I also discussed with the patient that there may be a patient responsible charge related to this service. The patient expressed understanding and verbally consented to this telephonic visit.    Interactive audio and video telecommunications were attempted between this provider and patient, however failed, due to patient having technical difficulties OR patient did not have access to video capability.  We continued and completed visit with audio only.  Some vital signs may be absent or patient reported.   Time Spent with patient on telephone encounter: 20 minutes   Review of Systems     Cardiac Risk Factors include: advanced age (>25men, >35 women);dyslipidemia;diabetes mellitus;hypertension;obesity (BMI >30kg/m2);sedentary lifestyle     Objective:    Today's Vitals   11/28/20 1239  Weight: 297 lb (134.7 kg)  Height: 6\' 4"  (1.93 m)   Body mass index is 36.15 kg/m.  Advanced Directives 11/28/2020 01/25/2020 03/06/2018 11/13/2016 08/29/2015 08/15/2015 06/20/2015  Does Patient Have a Medical Advance Directive? Yes Yes No No Yes Yes -  Type of Paramedic of [place];Living will - - - Special educational needs teacher of [place];Living will -  Does patient want to make changes to medical advance directive? - No - Patient declined - - - - -  Copy of [place] in Chart? No - copy requested - - - - - -  Would patient  like information on creating a medical advance directive? - - No - Patient declined - - - No - patient declined information    Current Medications (verified) Outpatient Encounter Medications as of 11/28/2020  Medication Sig  . benazepril (LOTENSIN) 20 MG tablet Take 2 tablets (40 mg total) by mouth daily.  . meloxicam (MOBIC) 15 MG tablet Take 15 mg by mouth daily. Take one tablet by mouth once daily  . pantoprazole (PROTONIX) 40 MG tablet TAKE ONE TABLET BY MOUTH EVERY EVENING  . UNABLE TO FIND Med Name: vitamin b 1000 bid   No facility-administered encounter medications on file as of 11/28/2020.    Allergies (verified) Enalapril maleate, Hctz [hydrochlorothiazide], Tribenzor [olmesartan-amlodipine-hctz], and Verapamil   History: Past Medical History:  Diagnosis Date  . Depression   . GERD (gastroesophageal reflux disease)   . Hx of adenomatous colonic polyps 09/04/2015  . Hyperlipidemia   . Hypertension   . LBP (low back pain)   . OSA (obstructive sleep apnea) 08/01/2018  . Sleep apnea    wears cpap    Past Surgical History:  Procedure Laterality Date  . COLONOSCOPY  2004, 2021   hyperplastic polyps x 2  . KNEE ARTHROSCOPY Right   . PENILE PROSTHESIS IMPLANT N/A 01/25/2020   Procedure: PENILE PROTHESIS INFLATABLE COLOPLAST;  Surgeon: Lucas Mallow, MD;  [facility];  Service: Urology;  Laterality: N/A;  . SHOULDER ARTHROSCOPY W/ ROTATOR CUFF REPAIR Left   . TONSILLECTOMY  as child  . vocal cord growth removed  age 35   benign  . WISDOM TOOTH EXTRACTION  Family History  Problem Relation Age of Onset  . Breast cancer Daughter   . Hypertension Father   . Heart disease Father   . Colon polyps Father   . Stroke Mother   . Depression Mother   . Hypertension Other   . Pancreatic cancer Brother   . Colon polyps Brother   . Throat cancer Brother   . Colon cancer Neg Hx   . Esophageal cancer Neg Hx   . Stomach cancer Neg Hx   . Rectal  cancer Neg Hx    Social History   Socioeconomic History  . Marital status: Divorced    Spouse name: Not on file  . Number of children: Not on file  . Years of education: Not on file  . Highest education level: Not on file  Occupational History  . Occupation: Retired  Tobacco Use  . Smoking status: Never Smoker  . Smokeless tobacco: Never Used  Vaping Use  . Vaping Use: Never used  Substance and Sexual Activity  . Alcohol use: Yes    Alcohol/week: 4.0 standard drinks    Types: 4 Cans of beer per week    Comment: 4 cans/week  . Drug use: No  . Sexual activity: Not Currently  Other Topics Concern  . Not on file  Social History Narrative   Regular exercise- yes, seldom; working physically all the time.   Social Determinants of Health   Financial Resource Strain: Low Risk   . Difficulty of Paying Living Expenses: Not hard at all  Food Insecurity: No Food Insecurity  . Worried About Charity fundraiser in the Last Year: Never true  . Ran Out of Food in the Last Year: Never true  Transportation Needs: No Transportation Needs  . Lack of Transportation (Medical): No  . Lack of Transportation (Non-Medical): No  Physical Activity: Inactive  . Days of Exercise per Week: 0 days  . Minutes of Exercise per Session: 0 min  Stress: No Stress Concern Present  . Feeling of Stress : Not at all  Social Connections: Socially Isolated  . Frequency of Communication with Friends and Family: More than three times a week  . Frequency of Social Gatherings with Friends and Family: More than three times a week  . Attends Religious Services: Never  . Active Member of Clubs or Organizations: No  . Attends Archivist Meetings: Never  . Marital Status: Divorced    Tobacco Counseling Counseling given: Not Answered   Clinical Intake:  Pre-visit preparation completed: Yes  Pain : No/denies pain     Nutritional Status: BMI > 30  Obese Nutritional Risks: None Diabetes: No  How  often do you need to have someone help you when you read instructions, pamphlets, or other written materials from your doctor or pharmacy?: 1 - Never  Diabetic?No  Interpreter Needed?: No  Information entered by :: Caroleen Hamman LPN   Activities of Daily Living In your present state of health, do you have any difficulty performing the following activities: 11/28/2020 10/27/2020  Hearing? Y N  Comment hearing loss -  Vision? N N  Difficulty concentrating or making decisions? N N  Walking or climbing stairs? N N  Dressing or bathing? N N  Doing errands, shopping? N N  Preparing Food and eating ? N -  Using the Toilet? N -  In the past six months, have you accidently leaked urine? N -  Do you have problems with loss of bowel control? N -  Managing your Medications? N -  Managing your Finances? N -  Housekeeping or managing your Housekeeping? N -  Some recent data might be hidden    Patient Care Team: Delorse Limber as PCP - General (Physician Assistant) Gatha Mayer, MD as Consulting Physician (Gastroenterology) Druscilla Brownie, MD as Consulting Physician (Dermatology) Jettie Booze, MD as Consulting Physician (Cardiology) Madelin Rear, [facility] as Pharmacist (Pharmacist)  Indicate any recent Medical Services you may have received from other than [facility] providers in the past year (date may be approximate).     Assessment:   This is a routine wellness examination for Ryleigh.  Hearing/Vision screen  Hearing Screening   125Hz  250Hz  500Hz  1000Hz  2000Hz  3000Hz  4000Hz  6000Hz  8000Hz   Right ear:           Left ear:           Comments: Hearing aids  Vision Screening Comments: Reading glasses Last eye exam-several years ago  Dietary issues and exercise activities discussed: Current Exercise Habits: The patient does not participate in regular exercise at present, Exercise limited by: None identified  Goals    . Increase physical activity     Increase walking.       . Patient Stated     Would like to lose some weight    . PharmD Care Plan     CARE PLAN ENTRY  Current Barriers:  . Chronic Disease Management support, education, and care coordination needs related to GERD, osteoarthritis, HTN and HLD  Pharmacist Clinical Goal(s):  . BP <140/90 . LDL <100 . Minimize GERD symptoms  . Minimize knee pain   Interventions: . Comprehensive medication review performed. . Recommend Voltaren 1% gel - apply 4 grams to knee four times daily (max 16 grams daily)  Patient Self Care Activities:  . Continued exercise at [facility] at least 3x/week for 30 minutes . Reduce intake of fatty foods . Patient verbalizes understanding of plan to call pharmacist, [name], with any questions.  Initial goal documentation.      Depression Screen PHQ 2/9 Scores 11/28/2020 10/27/2020 06/16/2019 03/06/2018 06/24/2017 11/13/2016 11/13/2016  PHQ - 2 Score 0 0 1 0 0 0 0  PHQ- 9 Score - 0 3 - 0 0 -    Fall Risk Fall Risk  11/28/2020 10/27/2020 09/28/2019 03/06/2018 11/13/2016  Falls in the past year? 0 0 0 No No  Number falls in past yr: 0 0 0 - -  Injury with Fall? 0 0 0 - -  Follow up Falls prevention discussed Falls evaluation completed Falls evaluation completed - -    FALL RISK PREVENTION PERTAINING TO THE HOME:  Any stairs in or around the home? Yes  If so, are there any without handrails? No  Home free of loose throw rugs in walkways, pet beds, electrical cords, etc? Yes  Adequate lighting in your home to reduce risk of falls? Yes   ASSISTIVE DEVICES UTILIZED TO PREVENT FALLS:  Life alert? No  Use of a cane, walker or w/c? No  Grab bars in the bathroom? Yes  Shower chair or bench in shower? No  Elevated toilet seat or a handicapped toilet? No   TIMED UP AND GO:  Was the test performed? No . Phone visit   Cognitive Function:Normal cognitive status assessed by  this Nurse Health Advisor. No abnormalities found.   MMSE - Mini Mental State Exam 03/06/2018 11/13/2016  06/20/2015  Orientation to time 5 5 5   Orientation to Place 5 5 5  Registration 3 3 3   Attention/ Calculation 5 5 5   Recall 3 3 3   Language- name 2 objects 2 2 2   Language- repeat 1 1 1   Language- follow 3 step command 3 3 3   Language- read & follow direction 1 1 1   Write a sentence 1 1 1   Copy design 1 1 1   Total score 30 30 30         Immunizations Immunization History  Administered Date(s) Administered  . Fluad Quad(high Dose 65+) 09/28/2019  . Influenza Split 08/08/2011  . Influenza, High Dose Seasonal PF 06/09/2018  . Influenza,inj,Quad PF,6+ Mos 06/20/2015, 11/13/2016, 06/24/2017  . PFIZER(Purple Top)SARS-COV-2 Vaccination 11/03/2019, 12/04/2019  . Pneumococcal Conjugate-13 11/02/2013  . Pneumococcal Polysaccharide-23 06/20/2015  . Tdap 11/02/2013  . Zoster 10/13/2009  . Zoster Recombinat (Shingrix) 03/06/2018, 06/09/2018    TDAP status: Up to date  Flu Vaccine status: Up to date  Per patient-date unknown  Pneumococcal vaccine status: Up to date  Covid-19 vaccine status: Completed vaccines-per patient-date of booster unknown  Qualifies for Shingles Vaccine? No   Zostavax completed Yes   Shingrix Completed?: Yes  Screening Tests Health Maintenance  Topic Date Due  . Hepatitis C Screening  Never done  . INFLUENZA VACCINE  05/08/2020  . COVID-19 Vaccine (3 - Booster for Pfizer series) 06/02/2020  . DTAP VACCINES (1) 04/12/2079 (Originally 09/28/1946)  . COLONOSCOPY (Pts 45-30yrs Insurance coverage will need to be confirmed)  10/18/2022  . DTaP/Tdap/Td (2 - Td or Tdap) 11/03/2023  . TETANUS/TDAP  11/03/2023  . PNA vac Low Risk Adult  Completed    Health Maintenance  Health Maintenance Due  Topic Date Due  . Hepatitis C Screening  Never done  . INFLUENZA VACCINE  05/08/2020  . COVID-19 Vaccine (3 - Booster for Pfizer series) 06/02/2020    Colorectal cancer screening: Type of screening: Colonoscopy. Completed 10/19/2019. Repeat every 3 years  Lung  Cancer Screening: (Low Dose CT Chest recommended if Age 3-80 years, 30 pack-year currently smoking OR have quit w/in 15years.) does not qualify.    Additional Screening:  Hepatitis C Screening: does qualify; Discuss with PCP  Vision Screening: Recommended annual ophthalmology exams for early detection of glaucoma and other disorders of the eye. Is the patient up to date with their annual eye exam?  No  Who is the provider or what is the name of the office in which the patient attends annual eye exams? unsure If pt is not established with a provider, would they like to be referred to a provider to establish care? No .   Dental Screening: Recommended annual dental exams for proper oral hygiene  Community Resource Referral / Chronic Care Management: CRR required this visit?  No   CCM required this visit?  No      Plan:     I have personally reviewed and noted the following in the patient's chart:   . Medical and social history . Use of alcohol, tobacco or illicit drugs  . Current medications and supplements . Functional ability and status . Nutritional status . Physical activity . Advanced directives . List of other physicians . Hospitalizations, surgeries, and ER visits in previous 12 months . Vitals . Screenings to include cognitive, depression, and falls . Referrals and appointments  In addition, I have reviewed and discussed with patient certain preventive protocols, quality metrics, and best practice recommendations. A written personalized care plan for preventive services as well as general preventive health recommendations were provided to patient.  Due to this being a telephonic visit, the after visit summary with patients personalized plan was offered to patient via mail or my-chart. / per request, patient was mailed a copy of [place], LPN   06/27/1006  Nurse Health Advisor  Nurse Notes: None

## 2020-11-28 ENCOUNTER — Ambulatory Visit (INDEPENDENT_AMBULATORY_CARE_PROVIDER_SITE_OTHER): Payer: Medicare HMO

## 2020-11-28 VITALS — Ht 76.0 in | Wt 297.0 lb

## 2020-11-28 DIAGNOSIS — Z0000 Encounter for general adult medical examination without abnormal findings: Secondary | ICD-10-CM | POA: Diagnosis not present

## 2020-11-28 NOTE — Patient Instructions (Signed)
Hector Edwards , Thank you for taking time to complete your Medicare Wellness Visit. I appreciate your ongoing commitment to your health goals. Please review the following plan we discussed and let me know if I can assist you in the future.   Screening recommendations/referrals: Colonoscopy: Completed 10/19/2019-Due 10/18/2022 Recommended yearly ophthalmology/optometry visit for glaucoma screening and checkup Recommended yearly dental visit for hygiene and checkup  Vaccinations: Influenza vaccine: Up to date-Please bring documentation to your next visit. Pneumococcal vaccine: Completed vacines Tdap vaccine: Up to date-Due-1/26/2-25 Shingles vaccine: Completed vaccines   Covid-19: Completed vaccines-Please bring documentation of your booster to your next visit.  Advanced directives: Please bring a copy for your chart.  Conditions/risks identified: See problem list  Next appointment: Follow up in one year for your annual wellness visit.   Preventive Care 73 Years and Older, Male Preventive care refers to lifestyle choices and visits with your health care provider that can promote health and wellness. What does preventive care include?  A yearly physical exam. This is also called an annual well check.  Dental exams once or twice a year.  Routine eye exams. Ask your health care provider how often you should have your eyes checked.  Personal lifestyle choices, including:  Daily care of your teeth and gums.  Regular physical activity.  Eating a healthy diet.  Avoiding tobacco and drug use.  Limiting alcohol use.  Practicing safe sex.  Taking low doses of aspirin every day.  Taking vitamin and mineral supplements as recommended by your health care provider. What happens during an annual well check? The services and screenings done by your health care provider during your annual well check will depend on your age, overall health, lifestyle risk factors, and family history of  disease. Counseling  Your health care provider may ask you questions about your:  Alcohol use.  Tobacco use.  Drug use.  Emotional well-being.  Home and relationship well-being.  Sexual activity.  Eating habits.  History of falls.  Memory and ability to understand (cognition).  Work and work Statistician. Screening  You may have the following tests or measurements:  Height, weight, and BMI.  Blood pressure.  Lipid and cholesterol levels. These may be checked every 5 years, or more frequently if you are over 67 years old.  Skin check.  Lung cancer screening. You may have this screening every year starting at age 44 if you have a 30-pack-year history of smoking and currently smoke or have quit within the past 15 years.  Fecal occult blood test (FOBT) of the stool. You may have this test every year starting at age 76.  Flexible sigmoidoscopy or colonoscopy. You may have a sigmoidoscopy every 5 years or a colonoscopy every 10 years starting at age 55.  Prostate cancer screening. Recommendations will vary depending on your family history and other risks.  Hepatitis C blood test.  Hepatitis B blood test.  Sexually transmitted disease (STD) testing.  Diabetes screening. This is done by checking your blood sugar (glucose) after you have not eaten for a while (fasting). You may have this done every 1-3 years.  Abdominal aortic aneurysm (AAA) screening. You may need this if you are a current or former smoker.  Osteoporosis. You may be screened starting at age 78 if you are at high risk. Talk with your health care provider about your test results, treatment options, and if necessary, the need for more tests. Vaccines  Your health care provider may recommend certain vaccines, such as:  Influenza vaccine. This is recommended every year.  Tetanus, diphtheria, and acellular pertussis (Tdap, Td) vaccine. You may need a Td booster every 10 years.  Zoster vaccine. You may  need this after age 50.  Pneumococcal 13-valent conjugate (PCV13) vaccine. One dose is recommended after age 40.  Pneumococcal polysaccharide (PPSV23) vaccine. One dose is recommended after age 69. Talk to your health care provider about which screenings and vaccines you need and how often you need them. This information is not intended to replace advice given to you by your health care provider. Make sure you discuss any questions you have with your health care provider. Document Released: 10/21/2015 Document Revised: 06/13/2016 Document Reviewed: 07/26/2015 Elsevier Interactive Patient Education  2017 [employer] Prevention in the Home Falls can cause injuries. They can happen to people of all ages. There are many things you can do to make your home safe and to help prevent falls. What can I do on the outside of my home?  Regularly fix the edges of walkways and driveways and fix any cracks.  Remove anything that might make you trip as you walk through a door, such as a raised step or threshold.  Trim any bushes or trees on the path to your home.  Use bright outdoor lighting.  Clear any walking paths of anything that might make someone trip, such as rocks or tools.  Regularly check to see if handrails are loose or broken. Make sure that both sides of any steps have handrails.  Any raised decks and porches should have guardrails on the edges.  Have any leaves, snow, or ice cleared regularly.  Use sand or salt on walking paths during winter.  Clean up any spills in your garage right away. This includes oil or grease spills. What can I do in the bathroom?  Use night lights.  Install grab bars by the toilet and in the tub and shower. Do not use towel bars as grab bars.  Use non-skid mats or decals in the tub or shower.  If you need to sit down in the shower, use a plastic, non-slip stool.  Keep the floor dry. Clean up any water that spills on the floor as soon as it  happens.  Remove soap buildup in the tub or shower regularly.  Attach bath mats securely with double-sided non-slip rug tape.  Do not have throw rugs and other things on the floor that can make you trip. What can I do in the bedroom?  Use night lights.  Make sure that you have a light by your bed that is easy to reach.  Do not use any sheets or blankets that are too big for your bed. They should not hang down onto the floor.  Have a firm chair that has side arms. You can use this for support while you get dressed.  Do not have throw rugs and other things on the floor that can make you trip. What can I do in the kitchen?  Clean up any spills right away.  Avoid walking on wet floors.  Keep items that you use a lot in easy-to-reach places.  If you need to reach something above you, use a strong step stool that has a grab bar.  Keep electrical cords out of the way.  Do not use floor polish or wax that makes floors slippery. If you must use wax, use non-skid floor wax.  Do not have throw rugs and other things on the floor that  can make you trip. What can I do with my stairs?  Do not leave any items on the stairs.  Make sure that there are handrails on both sides of the stairs and use them. Fix handrails that are broken or loose. Make sure that handrails are as long as the stairways.  Check any carpeting to make sure that it is firmly attached to the stairs. Fix any carpet that is loose or worn.  Avoid having throw rugs at the top or bottom of the stairs. If you do have throw rugs, attach them to the floor with carpet tape.  Make sure that you have a light switch at the top of the stairs and the bottom of the stairs. If you do not have them, ask someone to add them for you. What else can I do to help prevent falls?  Wear shoes that:  Do not have high heels.  Have rubber bottoms.  Are comfortable and fit you well.  Are closed at the toe. Do not wear sandals.  If you  use a stepladder:  Make sure that it is fully opened. Do not climb a closed stepladder.  Make sure that both sides of the stepladder are locked into place.  Ask someone to hold it for you, if possible.  Clearly mark and make sure that you can see:  Any grab bars or handrails.  First and last steps.  Where the edge of each step is.  Use tools that help you move around (mobility aids) if they are needed. These include:  Canes.  Walkers.  Scooters.  Crutches.  Turn on the lights when you go into a dark area. Replace any light bulbs as soon as they burn out.  Set up your furniture so you have a clear path. Avoid moving your furniture around.  If any of your floors are uneven, fix them.  If there are any pets around you, be aware of where they are.  Review your medicines with your doctor. Some medicines can make you feel dizzy. This can increase your chance of falling. Ask your doctor what other things that you can do to help prevent falls. This information is not intended to replace advice given to you by your health care provider. Make sure you discuss any questions you have with your health care provider. Document Released: 07/21/2009 Document Revised: 03/01/2016 Document Reviewed: 10/29/2014 Elsevier Interactive Patient Education  2017 [employer].

## 2020-12-02 ENCOUNTER — Telehealth: Payer: Self-pay

## 2020-12-02 NOTE — Chronic Care Management (AMB) (Signed)
    Chronic Care Management Pharmacy Assistant   Name: Hector Edwards  MRN: [id number] DOB: [birth year]  Reason for Encounter: Chart Review  PCP : Brunetta Jeans, PA-C  Allergies:   Allergies  Allergen Reactions  . Enalapril Maleate     REACTION: cramps  . Hctz [Hydrochlorothiazide]     SOB  . Tribenzor [Olmesartan-Amlodipine-Hctz] Swelling    Fatigue, CP, edema  . Verapamil     REACTION: Fatigue    Medications: Outpatient Encounter Medications as of 12/02/2020  Medication Sig  . benazepril (LOTENSIN) 20 MG tablet Take 2 tablets (40 mg total) by mouth daily.  . meloxicam (MOBIC) 15 MG tablet Take 15 mg by mouth daily. Take one tablet by mouth once daily  . pantoprazole (PROTONIX) 40 MG tablet TAKE ONE TABLET BY MOUTH EVERY EVENING  . UNABLE TO FIND Med Name: vitamin b 1000 bid   No facility-administered encounter medications on file as of 12/02/2020.    Current Diagnosis: Patient Active Problem List   Diagnosis Date Noted  . Class 2 severe obesity with serious comorbidity and body mass index (BMI) of 37.0 to 37.9 in adult, unspecified obesity type ([facility]) 10/27/2020  . OSA (obstructive sleep apnea) 08/01/2018  . Benign prostatic hyperplasia with nocturia 11/26/2016  . Prostate cancer screening 11/26/2016  . Medicare annual wellness visit, subsequent 11/26/2016  . Hx of adenomatous colonic polyps 09/04/2015  . Annual physical exam 07/20/2015  . Visit for preventive health examination 05/04/2013  . Erectile dysfunction 05/04/2013  . Snoring 02/13/2012  . GERD (gastroesophageal reflux disease) 08/12/2011  . Osteoarthritis 08/08/2011  . Hyperlipidemia 10/13/2007  . Essential hypertension 10/13/2007    Reviewed chart for medication changes. No OVs, Consults, or hospital visits since last care coordination call/Pharmacist visit.  No medication changes indicated.  Future Appointments  Date Time Provider [place]  04/20/2021 10:30 AM [facility] PHARMACIST  [facility]    April D Calhoun, [place] Pharmacist Assistant [phone]   Follow-Up:  Pharmacist Review

## 2020-12-27 ENCOUNTER — Telehealth: Payer: Self-pay

## 2020-12-27 NOTE — Progress Notes (Signed)
    Chronic Care Management Pharmacy Assistant   Name: BRUK TUMOLO  MRN: [id number] DOB: 06-30-1946  Reason for Encounter: Medication Coordination  Medications: Outpatient Encounter Medications as of 12/27/2020  Medication Sig  . benazepril (LOTENSIN) 20 MG tablet Take 2 tablets (40 mg total) by mouth daily.  . meloxicam (MOBIC) 15 MG tablet Take 15 mg by mouth daily. Take one tablet by mouth once daily  . pantoprazole (PROTONIX) 40 MG tablet TAKE ONE TABLET BY MOUTH EVERY EVENING  . UNABLE TO FIND Med Name: vitamin b 1000 bid   No facility-administered encounter medications on file as of 12/27/2020.   Reviewed chart for medication changes ahead of medication coordination call.  No OVs, Consults, or hospital visits since last care coordination call/Pharmacist visit. (If appropriate, list visit date, provider name)  No medication changes indicated OR if recent visit, treatment plan here.  BP Readings from Last 3 Encounters:  10/27/20 130/80  01/26/20 130/73  10/19/19 (!) 146/76    Lab Results  Component Value Date   HGBA1C 6.0 10/28/2020     Patient obtains medications through Adherence Packaging  90 Days   Last adherence delivery included:  Benazepril 20 Mg Tab Take two tablets everyday at bedtime Vitamin B-12 1,000 mcg Take one tab everyday at bedtime Pantoprazole 40 mg Take one tab by mouth every evening  Called patient and reviewed medications and coordinated delivery. Patient is not needing any medications at this time . Patient is not due for delivery of medications until April 2022.  Georgiana Shore ,[place] Pharmacist Assistant [phone]

## 2021-01-06 NOTE — Progress Notes (Deleted)
    Subjective:    CC: B hand pain, R>L  I, Laketha Leopard, LAT, ATC, am serving as scribe for Dr. Lynne Leader.  HPI: Pt is a 75 y/o male presenting w/ B hand pain, R>L, x .  Hand swelling: Radiating pain: Aggravating factors: Treatments tried:  Pertinent review of Systems: ***  Relevant historical information: ***   Objective:   There were no vitals filed for this visit. General: Well Developed, well nourished, and in no acute distress.   MSK: ***  Lab and Radiology Results No results found for this or any previous visit (from the past 72 hour(s)). No results found.    Impression and Recommendations:    Assessment and Plan: 75 y.o. male with ***.  PDMP not reviewed this encounter. No orders of the defined types were placed in this encounter.  No orders of the defined types were placed in this encounter.   Discussed warning signs or symptoms. Please see discharge instructions. Patient expresses understanding.   ***

## 2021-01-09 ENCOUNTER — Ambulatory Visit: Payer: Medicare HMO | Admitting: Family Medicine

## 2021-02-06 DIAGNOSIS — I10 Essential (primary) hypertension: Secondary | ICD-10-CM | POA: Diagnosis not present

## 2021-02-06 DIAGNOSIS — K219 Gastro-esophageal reflux disease without esophagitis: Secondary | ICD-10-CM | POA: Diagnosis not present

## 2021-02-06 DIAGNOSIS — M1990 Unspecified osteoarthritis, unspecified site: Secondary | ICD-10-CM | POA: Diagnosis not present

## 2021-02-06 DIAGNOSIS — Z008 Encounter for other general examination: Secondary | ICD-10-CM | POA: Diagnosis not present

## 2021-02-06 DIAGNOSIS — Z803 Family history of malignant neoplasm of breast: Secondary | ICD-10-CM | POA: Diagnosis not present

## 2021-02-06 DIAGNOSIS — G4733 Obstructive sleep apnea (adult) (pediatric): Secondary | ICD-10-CM | POA: Diagnosis not present

## 2021-02-06 DIAGNOSIS — Z6836 Body mass index (BMI) 36.0-36.9, adult: Secondary | ICD-10-CM | POA: Diagnosis not present

## 2021-02-24 ENCOUNTER — Telehealth: Payer: Self-pay

## 2021-02-24 NOTE — Progress Notes (Cosign Needed)
error 

## 2021-02-24 NOTE — Chronic Care Management (AMB) (Signed)
    Chronic Care Management Pharmacy Assistant   Name: Hector Edwards  MRN: [id number] DOB: Sep 23, 1946  Reason for Encounter: Medication Coordination call   Recent office visits:  None noted  Recent consult visits:  None noted  Hospital visits:  None in previous 6 months  Medications: Outpatient Encounter Medications as of 02/24/2021  Medication Sig  . benazepril (LOTENSIN) 20 MG tablet Take 2 tablets (40 mg total) by mouth daily.  . meloxicam (MOBIC) 15 MG tablet Take 15 mg by mouth daily. Take one tablet by mouth once daily  . pantoprazole (PROTONIX) 40 MG tablet TAKE ONE TABLET BY MOUTH EVERY EVENING  . UNABLE TO FIND Med Name: vitamin b 1000 bid   No facility-administered encounter medications on file as of 02/24/2021.    Reviewed chart for medication changes ahead of medication coordination call.  No OVs, Consults, or hospital visits since last care coordination call/Pharmacist visit. (If appropriate, list visit date, provider name)  No medication changes indicated OR if recent visit, treatment plan here.  BP Readings from Last 3 Encounters:  10/27/20 130/80  01/26/20 130/73  10/19/19 (!) 146/76    Lab Results  Component Value Date   HGBA1C 6.0 10/28/2020     Patient obtains medications through Adherence Packaging  90 Days   Last adherence delivery included:  Benazepril 20 Mg Tab Take two tablets everyday at bedtime Vitamin B-12 1,000 mcg Take one tab everyday at bedtime Pantoprazole 40 mg Take one tab by mouth every evening   Patient is due for next adherence delivery on: 03/06/2021 Called patient and reviewed medications and coordinated delivery.  This delivery to include: Benazepril 20 Mg Tab Take two tablets everyday at bedtime Vitamin B-12 1,000 mcg Take one tab everyday at bedtime Pantoprazole 40 mg Take one tab by mouth every evening  Confirmed delivery date of 03/06/2021, advised patient that pharmacy will contact them the morning of  delivery.  Wilford Sports CPA, CMA

## 2021-03-02 ENCOUNTER — Other Ambulatory Visit: Payer: Self-pay

## 2021-03-02 DIAGNOSIS — I10 Essential (primary) hypertension: Secondary | ICD-10-CM

## 2021-03-02 MED ORDER — BENAZEPRIL HCL 20 MG PO TABS: 40.0000 mg | ORAL_TABLET | Freq: Every day | ORAL | 0 refills | Status: DC

## 2021-03-28 ENCOUNTER — Encounter: Payer: Self-pay | Admitting: Family

## 2021-03-28 ENCOUNTER — Ambulatory Visit (INDEPENDENT_AMBULATORY_CARE_PROVIDER_SITE_OTHER): Payer: Medicare HMO | Admitting: Family

## 2021-03-28 ENCOUNTER — Other Ambulatory Visit: Payer: Self-pay

## 2021-03-28 VITALS — BP 130/72 | HR 89 | Temp 98.0°F | Ht 76.0 in | Wt 292.0 lb

## 2021-03-28 DIAGNOSIS — I10 Essential (primary) hypertension: Secondary | ICD-10-CM | POA: Diagnosis not present

## 2021-03-28 DIAGNOSIS — K219 Gastro-esophageal reflux disease without esophagitis: Secondary | ICD-10-CM | POA: Diagnosis not present

## 2021-03-28 DIAGNOSIS — R490 Dysphonia: Secondary | ICD-10-CM

## 2021-03-28 DIAGNOSIS — R7303 Prediabetes: Secondary | ICD-10-CM

## 2021-03-28 LAB — COMPREHENSIVE METABOLIC PANEL
ALT: 25 U/L (ref 0–53)
AST: 18 U/L (ref 0–37)
Albumin: 4.5 g/dL (ref 3.5–5.2)
Alkaline Phosphatase: 58 U/L (ref 39–117)
BUN: 10 mg/dL (ref 6–23)
CO2: 28 mEq/L (ref 19–32)
Calcium: 9.6 mg/dL (ref 8.4–10.5)
Chloride: 100 mEq/L (ref 96–112)
Creatinine, Ser: 0.9 mg/dL (ref 0.40–1.50)
GFR: 85.23 mL/min (ref >60.00)
Glucose, Bld: 118 mg/dL — ABNORMAL HIGH (ref 70–99)
Potassium: 4.3 mEq/L (ref 3.5–5.1)
Sodium: 136 mEq/L (ref 135–145)
Total Bilirubin: 0.7 mg/dL (ref 0.2–1.2)
Total Protein: 7.2 g/dL (ref 6.0–8.3)

## 2021-03-28 LAB — HEMOGLOBIN A1C: Hgb A1c MFr Bld: 5.8 % (ref 4.6–6.5)

## 2021-03-28 NOTE — Progress Notes (Signed)
NUR KRASINSKI is a 75 y.o. male with the following history as recorded in EpicCare:  Patient Active Problem List   Diagnosis Date Noted   Class 2 severe obesity with serious comorbidity and body mass index (BMI) of 37.0 to 37.9 in adult, unspecified obesity type ([facility]) 10/27/2020   OSA (obstructive sleep apnea) 08/01/2018   Benign prostatic hyperplasia with nocturia 11/26/2016   Prostate cancer screening 11/26/2016   Medicare annual wellness visit, subsequent 11/26/2016   Hx of adenomatous colonic polyps 09/04/2015   Annual physical exam 07/20/2015   Visit for preventive health examination 05/04/2013   Erectile dysfunction 05/04/2013   Snoring 02/13/2012   GERD (gastroesophageal reflux disease) 08/12/2011   Osteoarthritis 08/08/2011   Hyperlipidemia 10/13/2007   Essential hypertension 10/13/2007    Current Outpatient Medications  Medication Sig Dispense Refill   benazepril (LOTENSIN) 20 MG tablet Take 2 tablets (40 mg total) by mouth daily. 180 tablet 0   meloxicam (MOBIC) 15 MG tablet Take 15 mg by mouth daily. Take one tablet by mouth once daily     pantoprazole (PROTONIX) 40 MG tablet TAKE ONE TABLET BY MOUTH EVERY EVENING 90 tablet 1   UNABLE TO FIND Med Name: vitamin b 1000 bid     No current facility-administered medications for this visit.    Allergies: Enalapril maleate, Hctz [hydrochlorothiazide], Tribenzor [olmesartan-amlodipine-hctz], and Verapamil  Past Medical History:  Diagnosis Date   Depression    GERD (gastroesophageal reflux disease)    Hx of adenomatous colonic polyps 09/04/2015   Hyperlipidemia    Hypertension    LBP (low back pain)    OSA (obstructive sleep apnea) 08/01/2018   Sleep apnea    wears cpap     Past Surgical History:  Procedure Laterality Date   COLONOSCOPY  2004, 2021   hyperplastic polyps x 2   KNEE ARTHROSCOPY Right    PENILE PROSTHESIS IMPLANT N/A 01/25/2020   Procedure: [place];  Surgeon: Lucas Mallow, MD;  Location: [facility];  Service: Urology;  Laterality: N/A;   SHOULDER ARTHROSCOPY W/ ROTATOR CUFF REPAIR Left    TONSILLECTOMY  as child   vocal cord growth removed  age 31   benign   31 TOOTH EXTRACTION      Family History  Problem Relation Age of Onset   Breast cancer Daughter    Hypertension Father    Heart disease Father    Colon polyps Father    Stroke Mother    Depression Mother    Hypertension Other    Pancreatic cancer Brother    Colon polyps Brother    Throat cancer Brother    Colon cancer Neg Hx    Esophageal cancer Neg Hx    Stomach cancer Neg Hx    Rectal cancer Neg Hx     Social History   Tobacco Use   Smoking status: Never   Smokeless tobacco: Never  Substance Use Topics   Alcohol use: Yes    Alcohol/week: 4.0 standard drinks    Types: 4 Cans of beer per week    Comment: 4 cans/week    Subjective:   TOC from his former PCP who left the [place] location; Needs 6 month repeat labs to follow up on pre-diabetes; Notes he "suddenly lost his tastebuds" approximately 3 months ago; did not have COVID; saw ENT but told that very little that could be done; Mentions that he occasionally has episodes of hoarseness- vague history provided/ mentions having polyps "  in his throat" as a child; did not mention the hoarseness to the ENT; is on benazepril and has been on this for a number of years with no issues; previously took Lotrel as well. Takes Protonix daily and feels that his GERD is very well controlled;   Objective:  Vitals:   03/28/21 1016  BP: 130/72  Pulse: 89  Temp: 98 F (36.7 C)  TempSrc: Oral  SpO2: 97%  Weight: 292 lb (132.5 kg)  Height: 6' 4"  (1.93 m)    General: Well developed, well nourished, in no acute distress  Skin : Warm and dry.  Head: Normocephalic and atraumatic  Eyes: Sclera and conjunctiva clear; pupils round and reactive to light; extraocular movements intact  Ears: External normal; canals clear;  tympanic membranes normal  Oropharynx: Pink, supple. No suspicious lesions  Neck: Supple without thyromegaly, adenopathy  Lungs: Respirations unlabored; clear to auscultation bilaterally without wheeze, rales, rhonchi  CVS exam: normal rate and regular rhythm.  Neurologic: Alert and oriented; speech intact; face symmetrical; moves all extremities well; CNII-XII intact without focal deficit   Assessment:  1. Pre-diabetes   2. Essential hypertension   3. Gastroesophageal reflux disease without esophagitis   4. Hoarseness     Plan:  Update labs today; Will continue Benazepril for now; discussed that the hoarseness could be related to his medication; patient seems hesitant to change at this time; Stable on Protonix; ? Etiology; patient did not mention to ENT at appointment last month and does not want to go back at this time; patient hesitant to change his blood pressure medication at this time; will need to monitor.  Time spent 30 minutes reviewing records and discussing treatment  This visit occurred during the SARS-CoV-2 public health emergency.  Safety protocols were in place, including screening questions prior to the visit, additional usage of staff PPE, and extensive cleaning of exam room while observing appropriate contact time as indicated for disinfecting solutions.    No follow-ups on file.  Orders Placed This Encounter  Procedures   Comp Met (CMET)   Hemoglobin A1c    Requested Prescriptions    No prescriptions requested or ordered in this encounter

## 2021-04-20 ENCOUNTER — Telehealth: Payer: Medicare HMO

## 2021-04-26 ENCOUNTER — Telehealth: Payer: Self-pay

## 2021-05-01 NOTE — Chronic Care Management (AMB) (Signed)
    Chronic Care Management Pharmacy Assistant   Name: Hector Edwards  MRN: [id number] DOB: 11/21/1945   Reason for Encounter: General Adherence Disease State Call    Recent office visits:  03/28/2021 OV [name], FNP; acute visit, hoarseness, will continue Benazepril for now; discussed that the hoarseness could be related to his medication; patient seems hesitant to change at this time, stable on Protonix  Recent consult visits:  None  Hospital visits:  None in previous 6 months  Medications: Outpatient Encounter Medications as of 04/26/2021  Medication Sig   benazepril (LOTENSIN) 20 MG tablet Take 2 tablets (40 mg total) by mouth daily.   meloxicam (MOBIC) 15 MG tablet Take 15 mg by mouth daily. Take one tablet by mouth once daily   pantoprazole (PROTONIX) 40 MG tablet TAKE ONE TABLET BY MOUTH EVERY EVENING   UNABLE TO FIND Med Name: vitamin b 1000 bid   No facility-administered encounter medications on file as of 04/26/2021.   Patient Questions: Have you had any problems recently with your health? Patient states he has not had any problems recently with his health.  Have you had any problems with your pharmacy? Patient states he has not had any problems with his pharmacy.  What issues or side effects are you having with your medications? Patient states he has had some hoarseness that may be related to his medication Benazepril. Patient states he would rather stay on this medication and doesn't think it's causing his hoarseness.  What would you like me to pass along to Madelin Rear, CPP for him to help you with?  Patient states he does not have anything to pass along at this time.  What can we do to take care of you better? Patient did not have any suggestions at this time.  Patient states he currently has new PCP he TOC from his former PCP who left the [place] location. This information has been updated in our patient panel.   Star Rating Drugs: Benazepril  20 mg  last filled 03/03/2021 90 DS  April D Calhoun, [place] Pharmacist Assistant [phone]

## 2021-05-19 ENCOUNTER — Telehealth: Payer: Self-pay

## 2021-05-19 NOTE — Chronic Care Management (AMB) (Cosign Needed)
    Chronic Care Management Pharmacy Assistant   Name: HEROD CONNERTON  MRN: [id number] DOB: January 31, 1946   Reason for Encounter: Disease State Medication Coordination   Medications: Outpatient Encounter Medications as of 05/19/2021  Medication Sig   benazepril (LOTENSIN) 20 MG tablet Take 2 tablets (40 mg total) by mouth daily.   meloxicam (MOBIC) 15 MG tablet Take 15 mg by mouth daily. Take one tablet by mouth once daily   pantoprazole (PROTONIX) 40 MG tablet TAKE ONE TABLET BY MOUTH EVERY EVENING   UNABLE TO FIND Med Name: vitamin b 1000 bid   No facility-administered encounter medications on file as of 05/19/2021.    Reviewed chart for medication changes ahead of medication coordination call.  No OVs, Consults, or hospital visits since last care coordination call/Pharmacist visit. (If appropriate, list visit date, provider name)  No medication changes indicated OR if recent visit, treatment plan here.  BP Readings from Last 3 Encounters:  03/28/21 130/72  10/27/20 130/80  01/26/20 130/73    Lab Results  Component Value Date   HGBA1C 5.8 03/28/2021     Patient obtains medications through Adherence Packaging  90 Days   Unsuccessful reach to complete patient's medication coordination call. I have called him 2x and left 2 voicemail's to return phone call as well.  Corrie Mckusick, [place]

## 2021-05-26 ENCOUNTER — Other Ambulatory Visit: Payer: Self-pay | Admitting: Family

## 2021-05-26 DIAGNOSIS — I10 Essential (primary) hypertension: Secondary | ICD-10-CM

## 2021-05-31 ENCOUNTER — Other Ambulatory Visit: Payer: Self-pay

## 2021-05-31 DIAGNOSIS — K219 Gastro-esophageal reflux disease without esophagitis: Secondary | ICD-10-CM

## 2021-05-31 MED ORDER — PANTOPRAZOLE SODIUM 40 MG PO TBEC: 40.0000 mg | DELAYED_RELEASE_TABLET | Freq: Every evening | ORAL | 1 refills | Status: DC

## 2021-06-01 ENCOUNTER — Telehealth: Payer: Self-pay

## 2021-06-01 NOTE — Progress Notes (Signed)
[facility] contacted me to call patient to request a refill on benazepril, I will reach out to [facility] for refill request.

## 2021-06-29 ENCOUNTER — Telehealth: Payer: Self-pay

## 2021-06-29 NOTE — Progress Notes (Addendum)
    Chronic Care Management Pharmacy Assistant   Name: Hector Edwards  MRN: [id number] DOB: Aug 27, 1946   Reason for Encounter: Medication Coordination Call    Recent office visits:  None noted.   Recent consult visits:  None noted.   Hospital visits:  None in previous 6 months1949-04-14  Medications: Outpatient Encounter Medications as of 06/29/2021  Medication Sig   benazepril (LOTENSIN) 20 MG tablet Take 2 tablets (40 mg total) by mouth daily.   meloxicam (MOBIC) 15 MG tablet Take 15 mg by mouth daily. Take one tablet by mouth once daily   pantoprazole (PROTONIX) 40 MG tablet Take 1 tablet (40 mg total) by mouth every evening.   UNABLE TO FIND Med Name: vitamin b 1000 bid   No facility-administered encounter medications on file as of 06/29/2021.    Reviewed chart for medication changes ahead of medication coordination call.  No OVs, Consults, or hospital visits since last care coordination call/Pharmacist visit. (If appropriate, list visit date, provider name)  No medication changes indicated OR if recent visit, treatment plan here.  BP Readings from Last 3 Encounters:  03/28/21 130/72  10/27/20 130/80  01/26/20 130/73    Lab Results  Component Value Date   HGBA1C 5.8 03/28/2021     Patient obtains medications through Adherence Packaging  90 Days   Last adherence delivery included: (medication name and frequency) on 02/24/21 Unable to reach patient for delivery on 05/19/21 per chart review.   Benazepril 20 Mg Tab Take two tablets everyday at bedtime Vitamin B-12 1,000 mcg Take one tab everyday at bedtime Pantoprazole 40 mg Take one tab by mouth every evening  Patient was unable to be reached last month on 05/19/21 to orchestrate last delivery.  Patient is due for next adherence delivery on: 07/04/21. Called patient and reviewed medications and coordinated delivery.  This delivery to include:  Benazepril 20 Mg Tab Take two tablets everyday at bedtime Vitamin  B-12 1,000 mcg Take one tab everyday at bedtime Pantoprazole 40 mg Take one tab by mouth every evening  Patient needs refills for Pantoprazole 40 mg and Benazepril 20 mg from PCP  Confirmed delivery date of 07/04/21, advised patient that pharmacy will contact them the morning of delivery.   Care Gaps  AWV: done 11/28/20 Colonoscopy: due 10/18/22 DM Eye Exam: N/A DM Foot Exam: N/A Microalbumin: never HbgAIC: done 03/28/21 (5.8) DEXA: never Mammogram: N/A   Star Rating Drugs: Benazepril (LOTENSIN) 20 MG tablet - last filled 5/27/722 (pt reported he still has some on hand from a previous acute fill)  Future Appointments  Date Time Provider [place]  10/31/2021  1:00 PM [name], FNP [facility] [place], [place] Pharmacist Assistant  [phone]  Time Spent: 38 minutes

## 2021-07-05 ENCOUNTER — Other Ambulatory Visit: Payer: Self-pay

## 2021-07-05 ENCOUNTER — Telehealth: Payer: Self-pay | Admitting: Pharmacist

## 2021-07-05 NOTE — Telephone Encounter (Signed)
Message forwarded to Carolyne Fiscal CMA for refill

## 2021-07-05 NOTE — Telephone Encounter (Signed)
Rx refilled.

## 2021-07-05 NOTE — Telephone Encounter (Signed)
-----   Message from [place] sent at 07/05/2021  1:37 PM EDT ----- Regarding: Refill request Patient has transferred from being supported by Madelin Rear, to now being supported by you. [facility] has requested a refill for this on benazepril which he receives all meds in packaging, and is due for delivery. Can this request be sent or addresses with  the PCP, if you are unable to fill, at the moment, due to you have not seen this patient yet. As a delivery is due, per [facility], Thank you.

## 2021-08-30 ENCOUNTER — Other Ambulatory Visit: Payer: Self-pay

## 2021-08-30 ENCOUNTER — Telehealth (INDEPENDENT_AMBULATORY_CARE_PROVIDER_SITE_OTHER): Payer: Medicare HMO | Admitting: Family Medicine

## 2021-08-30 VITALS — Resp 18 | Ht 76.0 in | Wt 292.0 lb

## 2021-08-30 DIAGNOSIS — J40 Bronchitis, not specified as acute or chronic: Secondary | ICD-10-CM

## 2021-08-30 MED ORDER — CEFDINIR 300 MG PO CAPS: 300.0000 mg | ORAL_CAPSULE | Freq: Two times a day (BID) | ORAL | 0 refills | Status: DC

## 2021-08-30 NOTE — Progress Notes (Signed)
[place] at [facility] [address] [place], [place] [address] [phone]  Date:  08/30/2021   Name:  Hector Edwards   DOB:  December 23, 1945   MRN:  [id number]  PCP:  [name], FNP    Chief Complaint: No chief complaint on file.   History of Present Illness:  Hector Edwards is a 75 y.o. very pleasant male patient who presents with the following:  Virtual visit today for concern of illness Primary patient of my partner Jodi Mourning, I have not seen this patient myself in the past Phone visit only today per patient request.  Patient location is home, my location is home.  Patient identity is confirmed with 2 factors, he gives consent for virtual visit today.  The patient and myself are present on the call  History of hypertension, sleep apnea, hyperlipidemia Labs on chart from June  He is vaccinated against COVID-19 He got sick about one week ago- he is "better than I was" but notes continued symptoms Today pt notes he is coughing, sneezing, has a runny nose He has noted chills and sweats, poor appetite No vomiting or diarrhea He did take a covid test and was negative during this illness No ST  He did have a fever to about 100 for a couple of days The cough is somewhat productive   He is not checking any vitals at home  Patient Active Problem List   Diagnosis Date Noted   Class 2 severe obesity with serious comorbidity and body mass index (BMI) of 37.0 to 37.9 in adult, unspecified obesity type ([place]) 10/27/2020   OSA (obstructive sleep apnea) 08/01/2018   Benign prostatic hyperplasia with nocturia 11/26/2016   Prostate cancer screening 11/26/2016   Medicare annual wellness visit, subsequent 11/26/2016   Hx of adenomatous colonic polyps 09/04/2015   Annual physical exam 07/20/2015   Visit for preventive health examination 05/04/2013   Erectile dysfunction 05/04/2013   Snoring 02/13/2012   GERD  (gastroesophageal reflux disease) 08/12/2011   Osteoarthritis 08/08/2011   Hyperlipidemia 10/13/2007   Essential hypertension 10/13/2007    Past Medical History:  Diagnosis Date   Depression    GERD (gastroesophageal reflux disease)    Hx of adenomatous colonic polyps 09/04/2015   Hyperlipidemia    Hypertension    LBP (low back pain)    OSA (obstructive sleep apnea) 08/01/2018   Sleep apnea    wears cpap     Past Surgical History:  Procedure Laterality Date   COLONOSCOPY  2004, 2021   hyperplastic polyps x 2   KNEE ARTHROSCOPY Right    PENILE PROSTHESIS IMPLANT N/A 01/25/2020   Procedure: PENILE PROTHESIS INFLATABLE COLOPLAST;  Surgeon: Lucas Mallow, MD;  Location: [facility];  Service: Urology;  Laterality: N/A;   SHOULDER ARTHROSCOPY W/ ROTATOR CUFF REPAIR Left    TONSILLECTOMY  as child   vocal cord growth removed  age 24   benign   WISDOM TOOTH EXTRACTION      Social History   Tobacco Use   Smoking status: Never   Smokeless tobacco: Never  Vaping Use   Vaping Use: Never used  Substance Use Topics   Alcohol use: Yes    Alcohol/week: 4.0 standard drinks    Types: 4 Cans of beer per week    Comment: 4 cans/week   Drug use: No    Family History  Problem Relation Age of Onset  Breast cancer Daughter    Hypertension Father    Heart disease Father    Colon polyps Father    Stroke Mother    Depression Mother    Hypertension Other    Pancreatic cancer Brother    Colon polyps Brother    Throat cancer Brother    Colon cancer Neg Hx    Esophageal cancer Neg Hx    Stomach cancer Neg Hx    Rectal cancer Neg Hx     Allergies  Allergen Reactions   Enalapril Maleate     REACTION: cramps   Hctz [Hydrochlorothiazide]     SOB   Tribenzor [Olmesartan-Amlodipine-Hctz] Swelling    Fatigue, CP, edema   Verapamil     REACTION: Fatigue    Medication list has been reviewed and updated.  Current Outpatient Medications on File Prior to  Visit  Medication Sig Dispense Refill   benazepril (LOTENSIN) 20 MG tablet TAKE TWO TABLETS BY MOUTH EVERYDAY AT BEDTIME 180 tablet 0   meloxicam (MOBIC) 15 MG tablet Take 15 mg by mouth daily. Take one tablet by mouth once daily     pantoprazole (PROTONIX) 40 MG tablet Take 1 tablet (40 mg total) by mouth every evening. 90 tablet 1   UNABLE TO FIND Med Name: vitamin b 1000 bid     No current facility-administered medications on file prior to visit.    Review of Systems:  As per HPI- otherwise negative.   Physical Examination: There were no vitals filed for this visit. There were no vitals filed for this visit. There is no height or weight on file to calculate BMI. Ideal Body Weight:    Spoke with patient on the telephone.  He does not sound distressed, no particular cough was noted  Assessment and Plan: Bronchitis - Plan: cefdinir (OMNICEF) 300 MG capsule  Virtual visit today for concern of illness.  Patient got sick about a week ago, it sounds as though he may have had influenza.  He is getting somewhat better, but he is concerned that he is still sick. Given long holiday weekend and inability to test for flu virtually I will prescribe antibiotics  Called in [place] for 10 days  Spoke with patient for about 5 minutes on the telephone  Signed Lamar Blinks, MD

## 2021-09-24 ENCOUNTER — Other Ambulatory Visit: Payer: Self-pay | Admitting: Family

## 2021-09-24 DIAGNOSIS — I10 Essential (primary) hypertension: Secondary | ICD-10-CM

## 2021-10-31 ENCOUNTER — Encounter: Payer: Medicare HMO | Admitting: Family

## 2021-11-26 ENCOUNTER — Encounter: Payer: Self-pay | Admitting: Internal Medicine

## 2021-12-17 ENCOUNTER — Other Ambulatory Visit: Payer: Self-pay | Admitting: Family

## 2021-12-17 DIAGNOSIS — K219 Gastro-esophageal reflux disease without esophagitis: Secondary | ICD-10-CM

## 2021-12-21 ENCOUNTER — Telehealth: Payer: Self-pay | Admitting: Family

## 2021-12-21 NOTE — Telephone Encounter (Signed)
Called pt to schedule cpe - pt due, VM not set up- could not leave message ?

## 2021-12-26 ENCOUNTER — Other Ambulatory Visit: Payer: Self-pay | Admitting: Family

## 2021-12-26 DIAGNOSIS — K219 Gastro-esophageal reflux disease without esophagitis: Secondary | ICD-10-CM

## 2021-12-26 DIAGNOSIS — I10 Essential (primary) hypertension: Secondary | ICD-10-CM

## 2021-12-26 NOTE — Telephone Encounter (Signed)
30 day supplies sent, letter sent to Pt via Mychart informing to schedule appt  ?

## 2022-01-04 ENCOUNTER — Telehealth: Payer: Self-pay | Admitting: Family

## 2022-01-04 NOTE — Telephone Encounter (Signed)
Left message for patient to call back and schedule Medicare Annual Wellness Visit (AWV) in office.  ? ?If not able to come in office, please offer to do virtually or by telephone.  Left office number and my jabber [phone]. ? ?Last AWV:11/28/2020 ? ?Please schedule at anytime with Nurse Health Advisor. ?  ?

## 2022-01-24 ENCOUNTER — Other Ambulatory Visit: Payer: Self-pay | Admitting: Family

## 2022-01-24 DIAGNOSIS — K219 Gastro-esophageal reflux disease without esophagitis: Secondary | ICD-10-CM

## 2022-01-24 DIAGNOSIS — I10 Essential (primary) hypertension: Secondary | ICD-10-CM

## 2022-02-07 DIAGNOSIS — Z6834 Body mass index (BMI) 34.0-34.9, adult: Secondary | ICD-10-CM | POA: Diagnosis not present

## 2022-02-07 DIAGNOSIS — N529 Male erectile dysfunction, unspecified: Secondary | ICD-10-CM | POA: Diagnosis not present

## 2022-02-07 DIAGNOSIS — E669 Obesity, unspecified: Secondary | ICD-10-CM | POA: Diagnosis not present

## 2022-02-07 DIAGNOSIS — I10 Essential (primary) hypertension: Secondary | ICD-10-CM | POA: Diagnosis not present

## 2022-02-07 DIAGNOSIS — K219 Gastro-esophageal reflux disease without esophagitis: Secondary | ICD-10-CM | POA: Diagnosis not present

## 2022-02-20 NOTE — Progress Notes (Signed)
This encounter was created in error - please disregard. Pt declined visit wanted to reschedule ?

## 2022-02-27 ENCOUNTER — Ambulatory Visit: Payer: Medicare HMO

## 2022-03-06 ENCOUNTER — Ambulatory Visit (INDEPENDENT_AMBULATORY_CARE_PROVIDER_SITE_OTHER): Payer: Medicare HMO

## 2022-03-06 VITALS — Ht 76.0 in | Wt 280.0 lb

## 2022-03-06 DIAGNOSIS — Z0000 Encounter for general adult medical examination without abnormal findings: Secondary | ICD-10-CM

## 2022-03-06 NOTE — Progress Notes (Addendum)
Subjective:   Hector Edwards is a 76 y.o. male who presents for Medicare Annual/Subsequent preventive examination.  I connected with Toni today by telephone and verified that I am speaking with the correct person using two identifiers. Location patient: home Location provider: work Persons participating in the virtual visit: patient, Marine scientist.    I discussed the limitations, risks, security and privacy concerns of performing an evaluation and management service by telephone and the availability of in person appointments. I also discussed with the patient that there may be a patient responsible charge related to this service. The patient expressed understanding and verbally consented to this telephonic visit.    Interactive audio and video telecommunications were attempted between this provider and patient, however failed, due to patient having technical difficulties OR patient did not have access to video capability.  We continued and completed visit with audio only.  Some vital signs may be absent or patient reported.   Time Spent with patient on telephone encounter: 20 minutes   Review of Systems     Cardiac Risk Factors include: advanced age (>60mn, >>40women);hypertension;dyslipidemia;obesity (BMI >30kg/m2);sedentary lifestyle;male gender     Objective:    Today's Vitals   03/06/22 1330  Weight: 280 lb (127 kg)  Height: '6\' 4"'$  (1.93 m)   Body mass index is 34.08 kg/m.     03/06/2022    1:33 PM 11/28/2020   12:42 PM 01/25/2020    5:59 AM 03/06/2018    9:40 AM 11/13/2016    9:29 AM 08/29/2015    8:00 AM 08/15/2015    7:57 AM  Advanced Directives  Does Patient Have a Medical Advance Directive? Yes Yes Yes No No Yes Yes  Type of AParamed[place]of [place] will [place] will    Healthcare Power of [place] Wall[place] will  Does patient want to make changes to medical advance directive?   No -  Patient declined      Copy of [place] Chart? No - copy requested No - copy requested       Would patient like information on creating a medical advance directive?    No - Patient declined       Current Medications (verified) Outpatient Encounter Medications as of 03/06/2022  Medication Sig   benazepril (LOTENSIN) 20 MG tablet TAKE TWO TABLETS BY MOUTH EVERYDAY AT BEDTIME   UNABLE TO FIND Med Name: vitamin b 1000 bid   meloxicam (MOBIC) 15 MG tablet Take 15 mg by mouth daily. Take one tablet by mouth once daily (Patient not taking: Reported on 03/06/2022)   pantoprazole (PROTONIX) 40 MG tablet TAKE ONE TABLET BY MOUTH EVERY EVENING (Patient not taking: Reported on 03/06/2022)   [DISCONTINUED] cefdinir (OMNICEF) 300 MG capsule Take 1 capsule (300 mg total) by mouth 2 (two) times daily.   No facility-administered encounter medications on file as of 03/06/2022.    Allergies (verified) Enalapril maleate, Hctz [hydrochlorothiazide], Tribenzor [olmesartan-amlodipine-hctz], and Verapamil   History: Past Medical History:  Diagnosis Date   Depression    GERD (gastroesophageal reflux disease)    Hx of adenomatous colonic polyps 09/04/2015   Hyperlipidemia    Hypertension    LBP (low back pain)    OSA (obstructive sleep apnea) 08/01/2018   Sleep apnea    wears cpap    Past Surgical History:  Procedure Laterality Date   COLONOSCOPY  2004, 2021   hyperplastic polyps x 2   KNEE ARTHROSCOPY  Right    PENILE PROSTHESIS IMPLANT N/A 01/25/2020   Procedure: PENILE PROTHESIS INFLATABLE COLOPLAST;  Surgeon: Lucas Mallow, MD;  Location: [facility];  Service: Urology;  Laterality: N/A;   SHOULDER ARTHROSCOPY W/ ROTATOR CUFF REPAIR Left    TONSILLECTOMY  as child   vocal cord growth removed  age 13   benign   65 TOOTH EXTRACTION     Family History  Problem Relation Age of Onset   Breast cancer Daughter    Hypertension Father    Heart disease Father     Colon polyps Father    Stroke Mother    Depression Mother    Hypertension Other    Pancreatic cancer Brother    Colon polyps Brother    Throat cancer Brother    Colon cancer Neg Hx    Esophageal cancer Neg Hx    Stomach cancer Neg Hx    Rectal cancer Neg Hx    Social History   Socioeconomic History   Marital status: Divorced    Spouse name: Not on file   Number of children: Not on file   Years of education: Not on file   Highest education level: Not on file  Occupational History   Occupation: Retired  Tobacco Use   Smoking status: Never   Smokeless tobacco: Never  Vaping Use   Vaping Use: Never used  Substance and Sexual Activity   Alcohol use: Yes    Alcohol/week: 4.0 standard drinks    Types: 4 Cans of beer per week    Comment: 4 cans/week   Drug use: No   Sexual activity: Not Currently  Other Topics Concern   Not on file  Social History Narrative   Regular exercise- yes, seldom; working physically all the time.   Social Determinants of Health   Financial Resource Strain: Low Risk    Difficulty of Paying Living Expenses: Not hard at all  Food Insecurity: No Food Insecurity   Worried About Charity fundraiser in the Last Year: Never true   [place] in the Last Year: Never true  Transportation Needs: No Transportation Needs   Lack of Transportation (Medical): No   Lack of Transportation (Non-Medical): No  Physical Activity: Inactive   Days of Exercise per Week: 0 days   Minutes of Exercise per Session: 0 min  Stress: No Stress Concern Present   Feeling of Stress : Not at all  Social Connections: Moderately Integrated   Frequency of Communication with Friends and Family: More than three times a week   Frequency of Social Gatherings with Friends and Family: More than three times a week   Attends Religious Services: 1 to 4 times per year   Active Member of [employer] or Organizations: Yes   Attends Archivist Meetings: 1 to 4 times per year    Marital Status: Divorced    Tobacco Counseling Counseling given: Not Answered   Clinical Intake:  Pre-visit preparation completed: Yes  Pain : No/denies pain     BMI - recorded: 34.08 Nutritional Status: BMI > 30  Obese Nutritional Risks: None Diabetes: No  How often do you need to have someone help you when you read instructions, pamphlets, or other written materials from your doctor or pharmacy?: 1 - Never  Diabetic?No  Interpreter Needed?: No  Information entered by :: Caroleen Hamman LPN   Activities of Daily Living    03/06/2022    1:35 PM  In your present  state of health, do you have any difficulty performing the following activities:  Hearing? 1  Comment hearing aids  Vision? 0  Difficulty concentrating or making decisions? 0  Walking or climbing stairs? 0  Dressing or bathing? 0  Doing errands, shopping? 0  Preparing Food and eating ? N  Using the Toilet? N  In the past six months, have you accidently leaked urine? N  Do you have problems with loss of bowel control? N  Managing your Medications? N  Managing your Finances? N  Housekeeping or managing your Housekeeping? N    Patient Care Team: [name], [place] as PCP - General (Internal Medicine) Gatha Mayer, MD as Consulting Physician (Gastroenterology) Druscilla Brownie, MD as Consulting Physician (Dermatology) Jettie Booze, MD as Consulting Physician (Cardiology) Madelin Rear, [facility] as Pharmacist (Pharmacist)  Indicate any recent Medical Services you may have received from other than [facility] providers in the past year (date may be approximate).     Assessment:   This is a routine wellness examination for Jos[place]at.  Hearing/Vision screen Hearing Screening - Comments:: Right hearing aid Vision Screening - Comments:: Last eye exam-2-3 years ago  Dietary issues and exercise activities discussed: Current Exercise Habits: The patient does not participate in regular exercise at  present, Exercise limited by: None identified   Goals Addressed             This Visit's Progress    Increase physical activity   Not on track    Increase walking.        Patient Stated   On track    Would like to lose some weight       Depression Screen    03/06/2022    1:35 PM 03/28/2021   10:17 AM 11/28/2020   12:45 PM 10/27/2020    9:31 AM 06/16/2019    9:30 AM 03/06/2018    9:41 AM 06/24/2017    9:28 AM  PHQ 2/9 Scores  PHQ - 2 Score 0 0 0 0 1 0 0  PHQ- 9 Score    0 3  0    Fall Risk    03/06/2022    1:34 PM 03/28/2021   10:17 AM 11/28/2020   12:44 PM 10/27/2020    9:31 AM 09/28/2019   10:05 AM  [place] in the past year? 0 0 0 0 0  Number falls in past yr: 0 0 0 0 0  Injury with Fall? 0 0 0 0 0  Follow up Falls prevention discussed Falls evaluation completed Falls prevention discussed Falls evaluation completed Falls evaluation completed    FALL RISK PREVENTION PERTAINING TO THE HOME:  Any stairs in or around the home? Yes  If so, are there any without handrails? No  Home free of loose throw rugs in walkways, pet beds, electrical cords, etc? Yes  Adequate lighting in your home to reduce risk of falls? Yes   ASSISTIVE DEVICES UTILIZED TO PREVENT FALLS:  Life alert? No  Use of a cane, walker or w/c? No  Grab bars in the bathroom? Yes  Shower chair or bench in shower? No  Elevated toilet seat or a handicapped toilet? No   TIMED UP AND GO:  Was the test performed? No . Phone visit   Cognitive Function:Normal cognitive status assessed by this Nurse Health Advisor. No abnormalities found.      03/06/2018    9:42 AM 11/13/2016   12:03 PM 06/20/2015    9:10 AM  MMSE - Mini Mental State Exam  Orientation to time '5 5 5  '$ Orientation to Place '5 5 5  '$ Registration '3 3 3  '$ Attention/ Calculation '5 5 5  '$ Recall '3 3 3  '$ Language- name 2 objects '2 2 2  '$ Language- repeat '1 1 1  '$ Language- follow 3 step command '3 3 3  '$ Language- read & follow direction '1 1 1   '$ Write a sentence '1 1 1  '$ Copy design '1 1 1  '$ Total score '30 30 30        '$ Immunizations Immunization History  Administered Date(s) Administered   Fluad Quad(high Dose 65+) 09/28/2019   Influenza Split 08/08/2011   Influenza, High Dose Seasonal PF 06/09/2018   Influenza,inj,Quad PF,6+ Mos 06/20/2015, 11/13/2016, 06/24/2017   PFIZER(Purple Top)SARS-COV-2 Vaccination 11/03/2019, 12/04/2019, 10/17/2020   Pneumococcal Conjugate-13 11/02/2013   Pneumococcal Polysaccharide-23 06/20/2015   Tdap 11/02/2013   Zoster Recombinat (Shingrix) 03/06/2018, 06/09/2018   Zoster, Live 10/13/2009    TDAP status: Up to date  Flu Vaccine status: Up to date  Pneumococcal vaccine status: Up to date  Covid-19 vaccine status: Information provided on how to obtain vaccines.   Qualifies for Shingles Vaccine? No   Zostavax completed Yes   Shingrix Completed?: Yes  Screening Tests Health Maintenance  Topic Date Due   COVID-19 Vaccine (4 - Booster for Pfizer series) 12/12/2020   Hepatitis C Screening  03/28/2022 (Originally 07/29/1964)   INFLUENZA VACCINE  05/08/2022   COLONOSCOPY (Pts 45-8yr Insurance coverage will need to be confirmed)  10/18/2022   TETANUS/TDAP  11/03/2023   Pneumonia Vaccine 76 Years old  Completed   Zoster Vaccines- Shingrix  Completed   HPV VACCINES  Aged Out    Health Maintenance  Health Maintenance Due  Topic Date Due   COVID-19 Vaccine [place] - [place] 12/12/2020    Colorectal cancer screening: Type of screening: Colonoscopy. Completed 10/19/2019. Repeat every 3 years  Lung Cancer Screening: (Low Dose CT Chest recommended if Age 74-80years, 30 pack-year currently smoking OR have quit w/in 15years.) does not qualify.     Additional Screening:  Hepatitis C Screening: does qualify; Declined  Vision Screening: Recommended annual ophthalmology exams for early detection of glaucoma and other disorders of the eye. Is the patient up to date with  their annual eye exam?  No  Who is the provider or what is the name of the office in which the patient attends annual eye exams? unknown  Dental Screening: Recommended annual dental exams for proper oral hygiene  Community Resource Referral / Chronic Care Management: CRR required this visit?  No   CCM required this visit?  No      Plan:     I have personally reviewed and noted the following in the patient's chart:   Medical and social history Use of alcohol, tobacco or illicit drugs  Current medications and supplements including opioid prescriptions. Patient is not currently taking opioid prescriptions. Functional ability and status Nutritional status Physical activity Advanced directives List of other physicians Hospitalizations, surgeries, and ER visits in previous 12 months Vitals Screenings to include cognitive, depression, and falls Referrals and appointments  In addition, I have reviewed and discussed with patient certain preventive protocols, quality metrics, and best practice recommendations. A written personalized care plan for preventive services as well as general preventive health recommendations were provided to patient.   Due to this being a telephonic visit, the after visit summary with patients personalized plan was offered to patient via  mail or my-chart. Patient would like to access on my-chart.   Marta Antu, LPN   [id number]  Nurse Health Advisor  Nurse Notes: None  Medical screening examination/treatment/procedure(s) were performed by non-physician practitioner and as supervising provider I was immediately available for consultation/collaboration.  I agree with above. [name], FNP

## 2022-03-06 NOTE — Patient Instructions (Signed)
Hector Edwards , Thank you for taking time to complete your Medicare Wellness Visit. I appreciate your ongoing commitment to your health goals. Please review the following plan we discussed and let me know if I can assist you in the future.   Screening recommendations/referrals: Colonoscopy: Completed 10/19/2019-Due 10/18/2022 Recommended yearly ophthalmology/optometry visit for glaucoma screening and checkup Recommended yearly dental visit for hygiene and checkup  Vaccinations: Influenza vaccine: Up to date Pneumococcal vaccine: Up to date Tdap vaccine: Up to date Shingles vaccine: Completed vaccines   Covid-19: Booster available at your local pharmacy  Advanced directives: Please bring a copy of Living Will and/or Healthcare Power of Attorney for your chart.   Conditions/risks identified: See problem list  Next appointment: Follow up in one year for your annual wellness visit.   Preventive Care 9 Years and Older, Male Preventive care refers to lifestyle choices and visits with your health care provider that can promote health and wellness. What does preventive care include? A yearly physical exam. This is also called an annual well check. Dental exams once or twice a year. Routine eye exams. Ask your health care provider how often you should have your eyes checked. Personal lifestyle choices, including: Daily care of your teeth and gums. Regular physical activity. Eating a healthy diet. Avoiding tobacco and drug use. Limiting alcohol use. Practicing safe sex. Taking low doses of aspirin every day. Taking vitamin and mineral supplements as recommended by your health care provider. What happens during an annual well check? The services and screenings done by your health care provider during your annual well check will depend on your age, overall health, lifestyle risk factors, and family history of disease. Counseling  Your health care provider may ask you questions about  your: Alcohol use. Tobacco use. Drug use. Emotional well-being. Home and relationship well-being. Sexual activity. Eating habits. History of falls. Memory and ability to understand (cognition). Work and work Statistician. Screening  You may have the following tests or measurements: Height, weight, and BMI. Blood pressure. Lipid and cholesterol levels. These may be checked every 5 years, or more frequently if you are over 85 years old. Skin check. Lung cancer screening. You may have this screening every year starting at age 62 if you have a 30-pack-year history of smoking and currently smoke or have quit within the past 15 years. Fecal occult blood test (FOBT) of the stool. You may have this test every year starting at age 37. Flexible sigmoidoscopy or colonoscopy. You may have a sigmoidoscopy every 5 years or a colonoscopy every 10 years starting at age 20. Prostate cancer screening. Recommendations will vary depending on your family history and other risks. Hepatitis C blood test. Hepatitis B blood test. Sexually transmitted disease (STD) testing. Diabetes screening. This is done by checking your blood sugar (glucose) after you have not eaten for a while (fasting). You may have this done every 1-3 years. Abdominal aortic aneurysm (AAA) screening. You may need this if you are a current or former smoker. Osteoporosis. You may be screened starting at age 65 if you are at high risk. Talk with your health care provider about your test results, treatment options, and if necessary, the need for more tests. Vaccines  Your health care provider may recommend certain vaccines, such as: Influenza vaccine. This is recommended every year. Tetanus, diphtheria, and acellular pertussis (Tdap, Td) vaccine. You may need a Td booster every 10 years. Zoster vaccine. You may need this after age 99. Pneumococcal 13-valent conjugate (PCV13) vaccine.  One dose is recommended after age 10. Pneumococcal  polysaccharide (PPSV23) vaccine. One dose is recommended after age 59. Talk to your health care provider about which screenings and vaccines you need and how often you need them. This information is not intended to replace advice given to you by your health care provider. Make sure you discuss any questions you have with your health care provider. Document Released: 10/21/2015 Document Revised: 06/13/2016 Document Reviewed: 07/26/2015 Elsevier Interactive Patient Education  2017 [employer] Prevention in the Home Falls can cause injuries. They can happen to people of all ages. There are many things you can do to make your home safe and to help prevent falls. What can I do on the outside of my home? Regularly fix the edges of walkways and driveways and fix any cracks. Remove anything that might make you trip as you walk through a door, such as a raised step or threshold. Trim any bushes or trees on the path to your home. Use bright outdoor lighting. Clear any walking paths of anything that might make someone trip, such as rocks or tools. Regularly check to see if handrails are loose or broken. Make sure that both sides of any steps have handrails. Any raised decks and porches should have guardrails on the edges. Have any leaves, snow, or ice cleared regularly. Use sand or salt on walking paths during winter. Clean up any spills in your garage right away. This includes oil or grease spills. What can I do in the bathroom? Use night lights. Install grab bars by the toilet and in the tub and shower. Do not use towel bars as grab bars. Use non-skid mats or decals in the tub or shower. If you need to sit down in the shower, use a plastic, non-slip stool. Keep the floor dry. Clean up any water that spills on the floor as soon as it happens. Remove soap buildup in the tub or shower regularly. Attach bath mats securely with double-sided non-slip rug tape. Do not have throw rugs and other  things on the floor that can make you trip. What can I do in the bedroom? Use night lights. Make sure that you have a light by your bed that is easy to reach. Do not use any sheets or blankets that are too big for your bed. They should not hang down onto the floor. Have a firm chair that has side arms. You can use this for support while you get dressed. Do not have throw rugs and other things on the floor that can make you trip. What can I do in the kitchen? Clean up any spills right away. Avoid walking on wet floors. Keep items that you use a lot in easy-to-reach places. If you need to reach something above you, use a strong step stool that has a grab bar. Keep electrical cords out of the way. Do not use floor polish or wax that makes floors slippery. If you must use wax, use non-skid floor wax. Do not have throw rugs and other things on the floor that can make you trip. What can I do with my stairs? Do not leave any items on the stairs. Make sure that there are handrails on both sides of the stairs and use them. Fix handrails that are broken or loose. Make sure that handrails are as long as the stairways. Check any carpeting to make sure that it is firmly attached to the stairs. Fix any carpet that is loose or  worn. Avoid having throw rugs at the top or bottom of the stairs. If you do have throw rugs, attach them to the floor with carpet tape. Make sure that you have a light switch at the top of the stairs and the bottom of the stairs. If you do not have them, ask someone to add them for you. What else can I do to help prevent falls? Wear shoes that: Do not have high heels. Have rubber bottoms. Are comfortable and fit you well. Are closed at the toe. Do not wear sandals. If you use a stepladder: Make sure that it is fully opened. Do not climb a closed stepladder. Make sure that both sides of the stepladder are locked into place. Ask someone to hold it for you, if possible. Clearly  mark and make sure that you can see: Any grab bars or handrails. First and last steps. Where the edge of each step is. Use tools that help you move around (mobility aids) if they are needed. These include: Canes. Walkers. Scooters. Crutches. Turn on the lights when you go into a dark area. Replace any light bulbs as soon as they burn out. Set up your furniture so you have a clear path. Avoid moving your furniture around. If any of your floors are uneven, fix them. If there are any pets around you, be aware of where they are. Review your medicines with your doctor. Some medicines can make you feel dizzy. This can increase your chance of falling. Ask your doctor what other things that you can do to help prevent falls. This information is not intended to replace advice given to you by your health care provider. Make sure you discuss any questions you have with your health care provider. Document Released: 07/21/2009 Document Revised: 03/01/2016 Document Reviewed: 10/29/2014 Elsevier Interactive Patient Education  2017 [employer].

## 2022-05-16 ENCOUNTER — Encounter (INDEPENDENT_AMBULATORY_CARE_PROVIDER_SITE_OTHER): Payer: Self-pay

## 2022-06-04 DIAGNOSIS — M1711 Unilateral primary osteoarthritis, right knee: Secondary | ICD-10-CM | POA: Diagnosis not present

## 2022-06-05 ENCOUNTER — Ambulatory Visit (INDEPENDENT_AMBULATORY_CARE_PROVIDER_SITE_OTHER): Payer: Medicare HMO | Admitting: Family

## 2022-06-05 ENCOUNTER — Encounter: Payer: Self-pay | Admitting: Family

## 2022-06-05 VITALS — BP 140/70 | HR 78 | Temp 97.9°F | Ht 76.0 in | Wt 299.2 lb

## 2022-06-05 DIAGNOSIS — R5383 Other fatigue: Secondary | ICD-10-CM

## 2022-06-05 DIAGNOSIS — R202 Paresthesia of skin: Secondary | ICD-10-CM | POA: Diagnosis not present

## 2022-06-05 DIAGNOSIS — R0609 Other forms of dyspnea: Secondary | ICD-10-CM

## 2022-06-05 DIAGNOSIS — R200 Anesthesia of skin: Secondary | ICD-10-CM | POA: Diagnosis not present

## 2022-06-05 DIAGNOSIS — R7303 Prediabetes: Secondary | ICD-10-CM

## 2022-06-05 NOTE — Progress Notes (Signed)
Hector Edwards is a 76 y.o. male with the following history as recorded in EpicCare:  Patient Active Problem List   Diagnosis Date Noted   Class 2 severe obesity with serious comorbidity and body mass index (BMI) of 37.0 to 37.9 in adult, unspecified obesity type ([place]) 10/27/2020   OSA (obstructive sleep apnea) 08/01/2018   Benign prostatic hyperplasia with nocturia 11/26/2016   Prostate cancer screening 11/26/2016   Medicare annual wellness visit, subsequent 11/26/2016   Hx of adenomatous colonic polyps 09/04/2015   Annual physical exam 07/20/2015   Visit for preventive health examination 05/04/2013   Erectile dysfunction 05/04/2013   Snoring 02/13/2012   GERD (gastroesophageal reflux disease) 08/12/2011   Osteoarthritis 08/08/2011   Hyperlipidemia 10/13/2007   Essential hypertension 10/13/2007    Current Outpatient Medications  Medication Sig Dispense Refill   benazepril (LOTENSIN) 20 MG tablet TAKE TWO TABLETS BY MOUTH EVERYDAY AT BEDTIME 60 tablet 0   meloxicam (MOBIC) 15 MG tablet Take 15 mg by mouth daily. Take one tablet by mouth once daily     pantoprazole (PROTONIX) 40 MG tablet TAKE ONE TABLET BY MOUTH EVERY EVENING 30 tablet 0   UNABLE TO FIND Med Name: vitamin b 1000 bid     No current facility-administered medications for this visit.    Allergies: Enalapril maleate, Hctz [hydrochlorothiazide], Tribenzor [olmesartan-amlodipine-hctz], and Verapamil  Past Medical History:  Diagnosis Date   Depression    GERD (gastroesophageal reflux disease)    Hx of adenomatous colonic polyps 09/04/2015   Hyperlipidemia    Hypertension    LBP (low back pain)    OSA (obstructive sleep apnea) 08/01/2018   Sleep apnea    wears cpap     Past Surgical History:  Procedure Laterality Date   COLONOSCOPY  2004, 2021   hyperplastic polyps x 2   KNEE ARTHROSCOPY Right    PENILE PROSTHESIS IMPLANT N/A 01/25/2020   Procedure: [place];  Surgeon: Lucas Mallow, MD;  Location: [facility];  Service: Urology;  Laterality: N/A;   SHOULDER ARTHROSCOPY W/ ROTATOR CUFF REPAIR Left    TONSILLECTOMY  as child   vocal cord growth removed  age 53   benign   53 TOOTH EXTRACTION      Family History  Problem Relation Age of Onset   Breast cancer Daughter    Hypertension Father    Heart disease Father    Colon polyps Father    Stroke Mother    Depression Mother    Hypertension Other    Pancreatic cancer Brother    Colon polyps Brother    Throat cancer Brother    Colon cancer Neg Hx    Esophageal cancer Neg Hx    Stomach cancer Neg Hx    Rectal cancer Neg Hx     Social History   Tobacco Use   Smoking status: Never   Smokeless tobacco: Never  Substance Use Topics   Alcohol use: Yes    Alcohol/week: 4.0 standard drinks of alcohol    Types: 4 Cans of beer per week    Comment: 4 cans/week    Subjective:   Complaining of fatigue x 6 months; history of sleep apnea- does not wear machine- could not tolerate; is unsure of how long ago his last sleep study was done;  No chest pain on exertion; does feel that he gets winded easily with exertion; similar symptoms occurred approximately 5 years ago and patient was referred to cardiology- unclear  if he actually completed that work up; notes that he does try to take his blood pressure medication regularly but does occasionally miss doses;       Objective:  Vitals:   06/05/22 1455  BP: (!) 140/70  Pulse: 78  Temp: 97.9 F (36.6 C)  TempSrc: Oral  SpO2: 98%  Weight: 299 lb 3.2 oz (135.7 kg)  Height: _0  (1.93 m)    General: Well developed, well nourished, in no acute distress  Skin : Warm and dry.  Head: Normocephalic and atraumatic  Lungs: Respirations unlabored; clear to auscultation bilaterally without wheeze, rales, rhonchi  CVS exam: normal rate and regular rhythm.  Abdomen: Soft; nontender; nondistended; normoactive bowel sounds; no masses or  hepatosplenomegaly  Musculoskeletal: No deformities; no active joint inflammation  Extremities: No edema, cyanosis, clubbing  Vessels: Symmetric bilaterally  Neurologic: Alert and oriented; speech intact; face symmetrical; moves all extremities well; CNII-XII intact without focal deficit  Assessment:  1. Other fatigue   2. Pre-diabetes   3. Numbness and tingling   4. Dyspnea on exertion     Plan:  Concern for underlying cardiac source for symptoms- will refer back to cardiology for further evaluation; will need to consider getting patient set back up for sleep study since he is not currently wearing CPAP; Update labs today- follow up to be determined;   No follow-ups on file.  Orders Placed This Encounter  Procedures   CBC with Differential/Platelet   Comp Met (CMET)   Hemoglobin A1c   TSH   B12   Ambulatory referral to Cardiology    Referral Priority:   Routine    Referral Type:   Consultation    Referral Reason:   Specialty Services Required    Requested Specialty:   Cardiology    Number of Visits Requested:   1    Requested Prescriptions    No prescriptions requested or ordered in this encounter

## 2022-06-06 DIAGNOSIS — M1711 Unilateral primary osteoarthritis, right knee: Secondary | ICD-10-CM | POA: Diagnosis not present

## 2022-06-06 LAB — CBC WITH DIFFERENTIAL/PLATELET
Basophils Absolute: 0.1 10*3/uL (ref 0.0–0.1)
Basophils Relative: 0.9 % (ref 0.0–3.0)
Eosinophils Absolute: 0 10*3/uL (ref 0.0–0.7)
Eosinophils Relative: 0.3 % (ref 0.0–5.0)
HCT: 43.8 % (ref 39.0–52.0)
Hemoglobin: 15.1 g/dL (ref 13.0–17.0)
Lymphocytes Relative: 17.9 % (ref 12.0–46.0)
Lymphs Abs: 2 10*3/uL (ref 0.7–4.0)
MCHC: 34.5 g/dL (ref 30.0–36.0)
MCV: 97.2 fl (ref 78.0–100.0)
Monocytes Absolute: 1.3 10*3/uL — ABNORMAL HIGH (ref 0.1–1.0)
Monocytes Relative: 11.5 % (ref 3.0–12.0)
Neutro Abs: 7.9 10*3/uL — ABNORMAL HIGH (ref 1.4–7.7)
Neutrophils Relative %: 69.4 % (ref 43.0–77.0)
Platelets: 204 10*3/uL (ref 150.0–400.0)
RBC: 4.5 Mil/uL (ref 4.22–5.81)
RDW: 13.8 % (ref 11.5–15.5)
WBC: 11.4 10*3/uL — ABNORMAL HIGH (ref 4.0–10.5)

## 2022-06-06 LAB — COMPREHENSIVE METABOLIC PANEL
ALT: 35 U/L (ref 0–53)
AST: 20 U/L (ref 0–37)
Albumin: 4.3 g/dL (ref 3.5–5.2)
Alkaline Phosphatase: 57 U/L (ref 39–117)
BUN: 15 mg/dL (ref 6–23)
CO2: 23 mEq/L (ref 19–32)
Calcium: 9.6 mg/dL (ref 8.4–10.5)
Chloride: 103 mEq/L (ref 96–112)
Creatinine, Ser: 0.8 mg/dL (ref 0.40–1.50)
GFR: 87.58 mL/min (ref >60.00)
Glucose, Bld: 153 mg/dL — ABNORMAL HIGH (ref 70–99)
Potassium: 4.1 mEq/L (ref 3.5–5.1)
Sodium: 136 mEq/L (ref 135–145)
Total Bilirubin: 0.4 mg/dL (ref 0.2–1.2)
Total Protein: 7.3 g/dL (ref 6.0–8.3)

## 2022-06-06 LAB — HEMOGLOBIN A1C: Hgb A1c MFr Bld: 5.9 % (ref 4.6–6.5)

## 2022-06-06 LAB — VITAMIN B12: Vitamin B-12: 962 pg/mL — ABNORMAL HIGH (ref 211–911)

## 2022-06-06 LAB — TSH: TSH: 2.37 u[IU]/mL (ref 0.35–5.50)

## 2022-06-07 ENCOUNTER — Other Ambulatory Visit: Payer: Self-pay | Admitting: Family

## 2022-06-07 DIAGNOSIS — R899 Unspecified abnormal finding in specimens from other organs, systems and tissues: Secondary | ICD-10-CM

## 2022-06-13 ENCOUNTER — Ambulatory Visit: Payer: Medicare HMO | Attending: Cardiovascular Disease | Admitting: Cardiovascular Disease

## 2022-06-13 ENCOUNTER — Encounter: Payer: Self-pay | Admitting: Cardiovascular Disease

## 2022-06-13 VITALS — BP 136/75 | HR 77 | Ht 76.0 in | Wt 296.0 lb

## 2022-06-13 DIAGNOSIS — R5383 Other fatigue: Secondary | ICD-10-CM

## 2022-06-13 DIAGNOSIS — R0602 Shortness of breath: Secondary | ICD-10-CM | POA: Diagnosis not present

## 2022-06-13 MED ORDER — METOPROLOL TARTRATE 100 MG PO TABS: ORAL_TABLET | ORAL | 0 refills | Status: DC

## 2022-06-13 NOTE — Patient Instructions (Addendum)
Medication Instructions:  Your physician recommends that you continue on your current medications as directed. Please refer to the Current Medication list given to you today.  *If you need a refill on your cardiac medications before your next appointment, please call your pharmacy*   Lab Work: none If you have labs (blood work) drawn today and your tests are completely normal, you will receive your results only by: [facility] (if you have MyChart) OR A paper copy in the mail If you have any lab test that is abnormal or we need to change your treatment, we will call you to review the results.   Testing/Procedures: Your physician has requested that you have an echocardiogram. Echocardiography is a painless test that uses sound waves to create images of your heart. It provides your doctor with information about the size and shape of your heart and how well your heart's chambers and valves are working. This procedure takes approximately one hour. There are no restrictions for this procedure.  Your physician has requested that you have cardiac CT. Cardiac computed tomography (CT) is a painless test that uses an x-ray machine to take clear, detailed pictures of your heart. For further information please visit [email or web address]. Please follow instruction sheet as given.     Follow-Up: At [facility], you and your health needs are our priority.  As part of our continuing mission to provide you with exceptional heart care, we have created designated Provider Care Teams.  These Care Teams include your primary Cardiologist (physician) and Advanced Practice Providers (APPs -  Physician Assistants and Nurse Practitioners) who all work together to provide you with the care you need, when you need it.  We recommend signing up for the patient portal called "MyChart".  Sign up information is provided on this After Visit Summary.  MyChart is used to connect with patients for Virtual Visits  (Telemedicine).  Patients are able to view lab/test results, encounter notes, upcoming appointments, etc.  Non-urgent messages can be sent to your provider as well.   To learn more about what you can do with MyChart, go to [email or web address].    Your next appointment:   3-4  week(s)  The format for your next appointment:   In Person  Provider:   Robbie Lis, PA-C, Nicholes Rough, PA-C, Melina Copa, PA-C, Ambrose Pancoast, NP, Cecilie Kicks, NP, Ermalinda Barrios, PA-C, Christen Bame, NP, or Richardson Dopp, PA-C         Other Instructions   Your cardiac CT will be scheduled at one of the below locations:   [facility] [address] [place], [place] [address] [phone]  [place] [address] [place], [place] [address] [phone]  If scheduled at [facility], please arrive at the [facility] and Children's Entrance (Entrance C2) of [facility] 30 minutes prior to test start time. You can use the FREE valet parking offered at entrance C (encouraged to control the heart rate for the test)  Proceed to the [facility] Radiology Department (first floor) to check-in and test prep.  All radiology patients and guests should use entrance C2 at [facility], accessed from [facility], even though the hospital's physical address listed is [address].    If scheduled at [facility], please arrive 15 mins early for check-in and test prep.  Please follow these instructions carefully (unless otherwise directed):  Hold all erectile dysfunction medications at  least 3 days (72 hrs) prior to test.  On the Night Before the Test: Be sure to Drink plenty of water. Do not consume any caffeinated/decaffeinated beverages or chocolate 12 hours prior to your test. Do not take any antihistamines 12 hours prior to your test.   On the Day of the Test: Drink plenty of  water until 1 hour prior to the test. Do not eat any food 4 hours prior to the test. You may take your regular medications prior to the test.  Take metoprolol (Lopressor) two hours prior to test. HOLD Furosemide/Hydrochlorothiazide morning of the test.             After the Test: Drink plenty of water. After receiving IV contrast, you may experience a mild flushed feeling. This is normal. On occasion, you may experience a mild rash up to 24 hours after the test. This is not dangerous. If this occurs, you can take Benadryl 25 mg and increase your fluid intake. If you experience trouble breathing, this can be serious. If it is severe call 911 IMMEDIATELY. If it is mild, please call our office. If you take any of these medications: Glipizide/Metformin, Avandament, Glucavance, please do not take 48 hours after completing test unless otherwise instructed.  We will call to schedule your test 2-4 weeks out understanding that some insurance companies will need an authorization prior to the service being performed.   For non-scheduling related questions, please contact the cardiac imaging nurse navigator should you have any questions/concerns: Marchia Bond, Cardiac Imaging Nurse Navigator Gordy Clement, Cardiac Imaging Nurse Navigator [facility] Direct Office Dial: [phone]   For scheduling needs, including cancellations and rescheduling, please call [place], [phone].   Important Information About Sugar

## 2022-06-13 NOTE — Progress Notes (Signed)
Chief Complaint  Patient presents with   New Patient (Initial Visit)    dyspnea   History of Present Illness: 76 yo male with history of depression, GERD, obesity, HTN, HLD and sleep apnea who is here today as a new consult for the evaluation of dyspnea on exertion. He was seen in 2017 by Dr. Irish Lack. Echo July 2017 with LV function, no valve disease. Nuclear stress test in 2017 with no ischemia. Echo September 2020 with normal LV function and no valvular disease. He tells me today that he has had progressive dyspnea and fatigue. No chest pain. Rare LE edema. He has sleep apnea but does not tolerate CPAP.   Primary Care Physician: [name], FNP   Past Medical History:  Diagnosis Date   Depression    GERD (gastroesophageal reflux disease)    Hx of adenomatous colonic polyps 09/04/2015   Hyperlipidemia    Hypertension    LBP (low back pain)    OSA (obstructive sleep apnea) 08/01/2018   Sleep apnea    wears cpap     Past Surgical History:  Procedure Laterality Date   COLONOSCOPY  2004, 2021   hyperplastic polyps x 2   KNEE ARTHROSCOPY Right    PENILE PROSTHESIS IMPLANT N/A 01/25/2020   Procedure: [place];  Surgeon: Lucas Mallow, MD;  Location: [facility];  Service: Urology;  Laterality: N/A;   SHOULDER ARTHROSCOPY W/ ROTATOR CUFF REPAIR Left    TONSILLECTOMY  as child   vocal cord growth removed  age 61   benign   WISDOM TOOTH EXTRACTION      Current Outpatient Medications  Medication Sig Dispense Refill   metoprolol tartrate (LOPRESSOR) 100 MG tablet Take one tablet by mouth 2 hours prior to CT scan 1 tablet 0   benazepril (LOTENSIN) 20 MG tablet TAKE TWO TABLETS BY MOUTH EVERYDAY AT BEDTIME 60 tablet 0   meloxicam (MOBIC) 15 MG tablet Take 15 mg by mouth daily. Take one tablet by mouth once daily     pantoprazole (PROTONIX) 40 MG tablet TAKE ONE TABLET BY MOUTH EVERY EVENING 30 tablet 0   UNABLE TO FIND  Med Name: vitamin b 1000 bid     No current facility-administered medications for this visit.    Allergies  Allergen Reactions   Enalapril Maleate     REACTION: cramps   Hctz [Hydrochlorothiazide]     SOB   Tribenzor [Olmesartan-Amlodipine-Hctz] Swelling    Fatigue, CP, edema   Verapamil     REACTION: Fatigue    Social History   Socioeconomic History   Marital status: Divorced    Spouse name: Not on file   Number of children: 1   Years of education: Not on file   Highest education level: Not on file  Occupational History   Occupation: Retired   Occupation: Retired-mailman  Tobacco Use   Smoking status: Never   Smokeless tobacco: Never  Vaping Use   Vaping Use: Never used  Substance and Sexual Activity   Alcohol use: Yes    Alcohol/week: 4.0 standard drinks of alcohol    Types: 4 Cans of beer per week    Comment: 4 cans/week   Drug use: No   Sexual activity: Not Currently  Other Topics Concern   Not on file  Social History Narrative   Regular exercise- yes, seldom; working physically all the time.   Social Determinants of Health   Financial Resource Strain: Low Risk  (  03/06/2022)   Overall Financial Resource Strain (CARDIA)    Difficulty of Paying Living Expenses: Not hard at all  Food Insecurity: No Food Insecurity (03/06/2022)   Hunger Vital Sign    Worried About Running Out of Food in the Last Year: Never true    Ran Out of Food in the Last Year: Never true  Transportation Needs: No Transportation Needs (03/06/2022)   PRAPARE - Hydrologist (Medical): No    Lack of Transportation (Non-Medical): No  Physical Activity: Inactive (03/06/2022)   Exercise Vital Sign    Days of Exercise per Week: 0 days    Minutes of Exercise per Session: 0 min  Stress: No Stress Concern Present (03/06/2022)   [place]    Feeling of Stress : Not at all  Social Connections: Moderately  Integrated (03/06/2022)   Social Connection and Isolation Panel [NHANES]    Frequency of Communication with Friends and Family: More than three times a week    Frequency of Social Gatherings with Friends and Family: More than three times a week    Attends Religious Services: 1 to 4 times per year    Active Member of [employer] or Organizations: Yes    Attends Archivist Meetings: 1 to 4 times per year    Marital Status: Divorced  Human resources officer Violence: Not At Risk (03/06/2022)   Humiliation, Afraid, Rape, and Kick questionnaire    Fear of Current or Ex-Partner: No    Emotionally Abused: No    Physically Abused: No    Sexually Abused: No    Family History  Problem Relation Age of Onset   Stroke Mother 51   Depression Mother    Hypertension Father    Heart disease Father    Colon polyps Father    Pancreatic cancer Brother    Colon polyps Brother    Throat cancer Brother    Breast cancer Daughter    Hypertension Other    Colon cancer Neg Hx    Esophageal cancer Neg Hx    Stomach cancer Neg Hx    Rectal cancer Neg Hx     Review of Systems:  As stated in the HPI and otherwise negative.   BP 136/75   Pulse 77   Ht '6\' 4"'$  (1.93 m)   Wt 296 lb (134.3 kg)   SpO2 96%   BMI 36.03 kg/m   Physical Examination: General: Well developed, well nourished, NAD  HEENT: OP clear, mucus membranes moist  SKIN: warm, dry. No rashes. Neuro: No focal deficits  Musculoskeletal: Muscle strength 5/5 all ext  Psychiatric: Mood and affect normal  Neck: No JVD, no carotid bruits, no thyromegaly, no lymphadenopathy.  Lungs:Clear bilaterally, no wheezes, rhonci, crackles Cardiovascular: Regular rate and rhythm. No murmurs, gallops or rubs. Abdomen:Soft. Bowel sounds present. Non-tender.  Extremities: No lower extremity edema. Pulses are 2 + in the bilateral DP/PT.  EKG:  EKG is ordered today. The ekg ordered today demonstrates NSR, LAFB, LVH  Recent Labs: 06/05/2022: ALT 35; BUN 15;  Creatinine, Ser 0.80; Hemoglobin 15.1; Platelets 204.0; Potassium 4.1; Sodium 136; TSH 2.37   Lipid Panel    Component Value Date/Time   CHOL 183 10/27/2020 0957   CHOL 170 06/16/2019 1035   TRIG 111.0 10/27/2020 0957   HDL 29.50 (L) 10/27/2020 0957   HDL 32 (L) 06/16/2019 1035   CHOLHDL 6 10/27/2020 0957   VLDL 22.2 10/27/2020 0957  LDLCALC 132 (H) 10/27/2020 0957   LDLCALC 121 (H) 06/16/2019 1035   LDLDIRECT 158.5 10/19/2008 0858     Wt Readings from Last 3 Encounters:  06/13/22 296 lb (134.3 kg)  06/05/22 299 lb 3.2 oz (135.7 kg)  03/06/22 280 lb (127 kg)      Assessment and Plan:   1. Dyspnea/Fatigue: Will arrange an echo to assess LVEF and exclude structural heart disease. Will arrange a coronary CTA to exclude CAD.   2. HTN: BP better on recheck today. Continue Lotensin.   Labs/ tests ordered today include:   Orders Placed This Encounter  Procedures   CT CORONARY MORPH W/CTA COR W/SCORE W/CA W/CM &/OR WO/CM   EKG 12-Lead   ECHOCARDIOGRAM COMPLETE     Disposition:   F/U with me or office APP in 3-4 weeks.    Signed, Lauree Chandler, MD 06/13/2022 9:45 AM    [facility] [address], [place], [place]  [address] Phone: [phone]; Fax: [phone]

## 2022-06-26 ENCOUNTER — Other Ambulatory Visit (HOSPITAL_COMMUNITY): Payer: Medicare HMO

## 2022-07-05 ENCOUNTER — Ambulatory Visit (HOSPITAL_COMMUNITY): Payer: Medicare HMO | Attending: Cardiovascular Disease

## 2022-07-05 DIAGNOSIS — R0602 Shortness of breath: Secondary | ICD-10-CM

## 2022-07-05 DIAGNOSIS — R5383 Other fatigue: Secondary | ICD-10-CM | POA: Insufficient documentation

## 2022-07-05 LAB — ECHOCARDIOGRAM COMPLETE
AR max vel: 2.6 cm2
AV Area VTI: 2.56 cm2
AV Area mean vel: 2.39 cm2
AV Mean grad: 12 mmHg
AV Peak grad: 18.8 mmHg
Ao pk vel: 2.17 m/s
Area-P 1/2: 4.68 cm2

## 2022-07-09 ENCOUNTER — Telehealth (HOSPITAL_COMMUNITY): Payer: Self-pay | Admitting: Emergency Medicine

## 2022-07-09 NOTE — Progress Notes (Signed)
Error. Patient was a no show to his appointment.

## 2022-07-09 NOTE — Telephone Encounter (Signed)
Attempted to call patient regarding upcoming cardiac CT appointment. °Left message on voicemail with name and callback number °Lasheena Frieze RN Navigator Cardiac Imaging °[facility] °[phone] Office °[phone] Cell ° °

## 2022-07-10 ENCOUNTER — Ambulatory Visit (HOSPITAL_COMMUNITY): Payer: Medicare HMO

## 2022-07-10 ENCOUNTER — Encounter: Payer: Medicare HMO | Admitting: Physician Assistant

## 2022-07-10 DIAGNOSIS — R0600 Dyspnea, unspecified: Secondary | ICD-10-CM

## 2022-07-10 DIAGNOSIS — I10 Essential (primary) hypertension: Secondary | ICD-10-CM

## 2022-07-23 ENCOUNTER — Telehealth (HOSPITAL_COMMUNITY): Payer: Self-pay | Admitting: Emergency Medicine

## 2022-07-23 NOTE — Telephone Encounter (Signed)
Reaching out to patient to offer assistance regarding upcoming cardiac imaging study; pt verbalizes understanding of appt date/time, parking situation and where to check in, pre-test NPO status and medications ordered, and verified current allergies; name and call back number provided for further questions should they arise Marchia Bond RN Navigator Cardiac Imaging [facility] [phone] office [phone] cell  Arrival [address] Denies iv issues Taking metop 2 hr prior Aware nitro

## 2022-07-24 ENCOUNTER — Ambulatory Visit (HOSPITAL_COMMUNITY)
Admission: RE | Admit: 2022-07-24 | Discharge: 2022-07-24 | Disposition: A | Discharge status: Home / Self Care | Payer: Medicare HMO | Source: Ambulatory Visit | Attending: Cardiology | Admitting: Cardiology

## 2022-07-24 ENCOUNTER — Ambulatory Visit (HOSPITAL_COMMUNITY)
Admission: RE | Admit: 2022-07-24 | Discharge: 2022-07-24 | Disposition: A | Discharge status: Home / Self Care | Payer: Medicare HMO | Source: Ambulatory Visit | Attending: Cardiovascular Disease | Admitting: Cardiovascular Disease

## 2022-07-24 ENCOUNTER — Other Ambulatory Visit: Payer: Self-pay | Admitting: Cardiology

## 2022-07-24 ENCOUNTER — Ambulatory Visit (HOSPITAL_BASED_OUTPATIENT_CLINIC_OR_DEPARTMENT_OTHER)
Admission: RE | Admit: 2022-07-24 | Discharge: 2022-07-24 | Disposition: A | Discharge status: Home / Self Care | Payer: Medicare HMO | Source: Ambulatory Visit | Attending: Cardiology | Admitting: Cardiology

## 2022-07-24 DIAGNOSIS — R5383 Other fatigue: Secondary | ICD-10-CM | POA: Insufficient documentation

## 2022-07-24 DIAGNOSIS — I2510 Atherosclerotic heart disease of native coronary artery without angina pectoris: Secondary | ICD-10-CM | POA: Diagnosis not present

## 2022-07-24 DIAGNOSIS — R0602 Shortness of breath: Secondary | ICD-10-CM | POA: Diagnosis present

## 2022-07-24 DIAGNOSIS — R931 Abnormal findings on diagnostic imaging of heart and coronary circulation: Secondary | ICD-10-CM | POA: Insufficient documentation

## 2022-07-24 MED ORDER — NITROGLYCERIN 0.4 MG SL SUBL
0.8000 mg | SUBLINGUAL_TABLET | Freq: Once | SUBLINGUAL | Status: AC
  Administered 2022-07-24: 0.8 mg via SUBLINGUAL

## 2022-07-24 MED ORDER — NITROGLYCERIN 0.4 MG SL SUBL
SUBLINGUAL_TABLET | SUBLINGUAL | Status: AC
  Filled 2022-07-24: qty 2

## 2022-07-24 MED ORDER — IOHEXOL 350 MG/ML SOLN
100.0000 mL | Freq: Once | INTRAVENOUS | Status: AC | PRN
  Administered 2022-07-24: 100 mL via INTRAVENOUS

## 2022-07-26 NOTE — Progress Notes (Signed)
Chief Complaint  Patient presents with   Follow-up    dyspnea   History of Present Illness: 76 yo male with history of depression, GERD, obesity, HTN, HLD and sleep apnea who is here today for follow up. I saw him as a new consult for the evaluation of dyspnea on exertion. He was seen in 2017 by Dr. Irish Lack. Echo July 2017 with LV function, no valve disease. Nuclear stress test in 2017 with no ischemia. Echo September 2020 with normal LV function and no valvular disease. At his visit here in September 2023 he described progressive dyspnea and fatigue but no chest pain. Rare LE edema. He has sleep apnea but does not tolerate CPAP. Echo 07/05/22 with LVEF=60-65%, moderate LVH. Mild aortic stenosis with mean gradient of 12 mmHg. Coronary CTA 07/24/22 with severe disease in the LAD and RCA that appears to be flow limiting by CT FFR.   He is here today for follow up. The patient denies any chest pain, palpitations, lower extremity edema, orthopnea, PND, dizziness, near syncope or syncope. He continues to have dyspnea and fatigue.  Primary Care Physician: [name], FNP   Past Medical History:  Diagnosis Date   Depression    GERD (gastroesophageal reflux disease)    Hx of adenomatous colonic polyps 09/04/2015   Hyperlipidemia    Hypertension    LBP (low back pain)    OSA (obstructive sleep apnea) 08/01/2018   Sleep apnea    wears cpap     Past Surgical History:  Procedure Laterality Date   COLONOSCOPY  2004, 2021   hyperplastic polyps x 2   KNEE ARTHROSCOPY Right    PENILE PROSTHESIS IMPLANT N/A 01/25/2020   Procedure: [place];  Surgeon: Lucas Mallow, MD;  Location: [facility];  Service: Urology;  Laterality: N/A;   SHOULDER ARTHROSCOPY W/ ROTATOR CUFF REPAIR Left    TONSILLECTOMY  as child   vocal cord growth removed  age 89   benign   WISDOM TOOTH EXTRACTION      Current Outpatient Medications  Medication Sig  Dispense Refill   benazepril (LOTENSIN) 20 MG tablet TAKE TWO TABLETS BY MOUTH EVERYDAY AT BEDTIME 60 tablet 0   meloxicam (MOBIC) 15 MG tablet Take 15 mg by mouth daily. Take one tablet by mouth once daily     pantoprazole (PROTONIX) 40 MG tablet TAKE ONE TABLET BY MOUTH EVERY EVENING 30 tablet 0   UNABLE TO FIND Med Name: vitamin b 1000 bid     No current facility-administered medications for this visit.    Allergies  Allergen Reactions   Enalapril Maleate     REACTION: cramps   Hctz [Hydrochlorothiazide]     SOB   Tribenzor [Olmesartan-Amlodipine-Hctz] Swelling    Fatigue, CP, edema   Verapamil     REACTION: Fatigue    Social History   Socioeconomic History   Marital status: Divorced    Spouse name: Not on file   Number of children: 1   Years of education: Not on file   Highest education level: Not on file  Occupational History   Occupation: Retired   Occupation: Retired-mailman  Tobacco Use   Smoking status: Never   Smokeless tobacco: Never  Vaping Use   Vaping Use: Never used  Substance and Sexual Activity   Alcohol use: Yes    Alcohol/week: 4.0 standard drinks of alcohol    Types: 4 Cans of beer per week    Comment: 4  cans/week   Drug use: No   Sexual activity: Not Currently  Other Topics Concern   Not on file  Social History Narrative   Regular exercise- yes, seldom; working physically all the time.   Social Determinants of Health   Financial Resource Strain: Low Risk  (03/06/2022)   Overall Financial Resource Strain (CARDIA)    Difficulty of Paying Living Expenses: Not hard at all  Food Insecurity: No Food Insecurity (03/06/2022)   Hunger Vital Sign    Worried About Running Out of Food in the Last Year: Never true    Ran Out of Food in the Last Year: Never true  Transportation Needs: No Transportation Needs (03/06/2022)   PRAPARE - Hydrologist (Medical): No    Lack of Transportation (Non-Medical): No  Physical Activity:  Inactive (03/06/2022)   Exercise Vital Sign    Days of Exercise per Week: 0 days    Minutes of Exercise per Session: 0 min  Stress: No Stress Concern Present (03/06/2022)   [place]    Feeling of Stress : Not at all  Social Connections: Moderately Integrated (03/06/2022)   Social Connection and Isolation Panel [NHANES]    Frequency of Communication with Friends and Family: More than three times a week    Frequency of Social Gatherings with Friends and Family: More than three times a week    Attends Religious Services: 1 to 4 times per year    Active Member of [employer] or Organizations: Yes    Attends Archivist Meetings: 1 to 4 times per year    Marital Status: Divorced  Human resources officer Violence: Not At Risk (03/06/2022)   Humiliation, Afraid, Rape, and Kick questionnaire    Fear of Current or Ex-Partner: No    Emotionally Abused: No    Physically Abused: No    Sexually Abused: No    Family History  Problem Relation Age of Onset   Stroke Mother 64   Depression Mother    Hypertension Father    Heart disease Father    Colon polyps Father    Pancreatic cancer Brother    Colon polyps Brother    Throat cancer Brother    Breast cancer Daughter    Hypertension Other    Colon cancer Neg Hx    Esophageal cancer Neg Hx    Stomach cancer Neg Hx    Rectal cancer Neg Hx     Review of Systems:  As stated in the HPI and otherwise negative.   BP (!) 140/82   Pulse 91   Ht '6\' 4"'$  (1.93 m)   Wt (!) 304 lb 9.6 oz (138.2 kg)   SpO2 97%   BMI 37.08 kg/m   Physical Examination: General: Well developed, well nourished, NAD  HEENT: OP clear, mucus membranes moist  SKIN: warm, dry. No rashes. Neuro: No focal deficits  Musculoskeletal: Muscle strength 5/5 all ext  Psychiatric: Mood and affect normal  Neck: No JVD, no carotid bruits, no thyromegaly, no lymphadenopathy.  Lungs:Clear bilaterally, no wheezes, rhonci,  crackles Cardiovascular: Regular rate and rhythm. No murmurs, gallops or rubs. Abdomen:Soft. Bowel sounds present. Non-tender.  Extremities: No lower extremity edema. Pulses are 2 + in the bilateral DP/PT.  EKG:  EKG is ordered today. The ekg ordered today demonstrates NSR, LVH  Recent Labs: 06/05/2022: ALT 35; BUN 15; Creatinine, Ser 0.80; Hemoglobin 15.1; Platelets 204.0; Potassium 4.1; Sodium 136; TSH 2.37   Lipid  Panel    Component Value Date/Time   CHOL 183 10/27/2020 0957   CHOL 170 06/16/2019 1035   TRIG 111.0 10/27/2020 0957   HDL 29.50 (L) 10/27/2020 0957   HDL 32 (L) 06/16/2019 1035   CHOLHDL 6 10/27/2020 0957   VLDL 22.2 10/27/2020 0957   LDLCALC 132 (H) 10/27/2020 0957   LDLCALC 121 (H) 06/16/2019 1035   LDLDIRECT 158.5 10/19/2008 0858     Wt Readings from Last 3 Encounters:  07/27/22 (!) 304 lb 9.6 oz (138.2 kg)  06/13/22 296 lb (134.3 kg)  06/05/22 299 lb 3.2 oz (135.7 kg)    Assessment and Plan:   1. CAD with angina: He has ongoing dyspnea and fatigue. Recent Coronary CTA with severe disease in the LAD and RCA. Cardiac cath is indicated.  I have reviewed the risks, indications, and alternatives to cardiac catheterization, possible angioplasty, and stenting with the patient. Risks include but are not limited to bleeding, infection, vascular injury, stroke, myocardial infection, arrhythmia, kidney injury, radiation-related injury in the case of prolonged fluoroscopy use, emergency cardiac surgery, and death. The patient understands the risks of serious complication is 1-2 in 0349 with diagnostic cardiac cath and 1-2% or less with angioplasty/stenting.  -Will plan cardiac cath at [facility] on 08/01/22 at 8:30 am. -BMET and CBC today   2. HTN: BP is stable. No changes  Labs/ tests ordered today include:   Orders Placed This Encounter  Procedures   CBC   Basic metabolic panel   EKG 17-HXTA   Disposition:   F/U with me after his cath  Signed, Lauree Chandler,  MD 07/27/2022 10:14 AM    [facility] [place], [place], [place]  [address] Phone: [phone]; Fax: [phone]

## 2022-07-26 NOTE — H&P (View-Only) (Signed)
Chief Complaint  Patient presents with   Follow-up    dyspnea   History of Present Illness: 76 yo male with history of depression, GERD, obesity, HTN, HLD and sleep apnea who is here today for follow up. I saw him as a new consult for the evaluation of dyspnea on exertion. He was seen in 2017 by Dr. Irish Lack. Echo July 2017 with LV function, no valve disease. Nuclear stress test in 2017 with no ischemia. Echo September 2020 with normal LV function and no valvular disease. At his visit here in September 2023 he described progressive dyspnea and fatigue but no chest pain. Rare LE edema. He has sleep apnea but does not tolerate CPAP. Echo 07/05/22 with LVEF=60-65%, moderate LVH. Mild aortic stenosis with mean gradient of 12 mmHg. Coronary CTA 07/24/22 with severe disease in the LAD and RCA that appears to be flow limiting by CT FFR.   He is here today for follow up. The patient denies any chest pain, palpitations, lower extremity edema, orthopnea, PND, dizziness, near syncope or syncope. He continues to have dyspnea and fatigue.  Primary Care Physician: [name], FNP   Past Medical History:  Diagnosis Date   Depression    GERD (gastroesophageal reflux disease)    Hx of adenomatous colonic polyps 09/04/2015   Hyperlipidemia    Hypertension    LBP (low back pain)    OSA (obstructive sleep apnea) 08/01/2018   Sleep apnea    wears cpap     Past Surgical History:  Procedure Laterality Date   COLONOSCOPY  2004, 2021   hyperplastic polyps x 2   KNEE ARTHROSCOPY Right    PENILE PROSTHESIS IMPLANT N/A 01/25/2020   Procedure: [place];  Surgeon: Lucas Mallow, MD;  Location: [facility];  Service: Urology;  Laterality: N/A;   SHOULDER ARTHROSCOPY W/ ROTATOR CUFF REPAIR Left    TONSILLECTOMY  as child   vocal cord growth removed  age 72   benign   WISDOM TOOTH EXTRACTION      Current Outpatient Medications  Medication Sig  Dispense Refill   benazepril (LOTENSIN) 20 MG tablet TAKE TWO TABLETS BY MOUTH EVERYDAY AT BEDTIME 60 tablet 0   meloxicam (MOBIC) 15 MG tablet Take 15 mg by mouth daily. Take one tablet by mouth once daily     pantoprazole (PROTONIX) 40 MG tablet TAKE ONE TABLET BY MOUTH EVERY EVENING 30 tablet 0   UNABLE TO FIND Med Name: vitamin b 1000 bid     No current facility-administered medications for this visit.    Allergies  Allergen Reactions   Enalapril Maleate     REACTION: cramps   Hctz [Hydrochlorothiazide]     SOB   Tribenzor [Olmesartan-Amlodipine-Hctz] Swelling    Fatigue, CP, edema   Verapamil     REACTION: Fatigue    Social History   Socioeconomic History   Marital status: Divorced    Spouse name: Not on file   Number of children: 1   Years of education: Not on file   Highest education level: Not on file  Occupational History   Occupation: Retired   Occupation: Retired-mailman  Tobacco Use   Smoking status: Never   Smokeless tobacco: Never  Vaping Use   Vaping Use: Never used  Substance and Sexual Activity   Alcohol use: Yes    Alcohol/week: 4.0 standard drinks of alcohol    Types: 4 Cans of beer per week    Comment: 4  cans/week   Drug use: No   Sexual activity: Not Currently  Other Topics Concern   Not on file  Social History Narrative   Regular exercise- yes, seldom; working physically all the time.   Social Determinants of Health   Financial Resource Strain: Low Risk  (03/06/2022)   Overall Financial Resource Strain (CARDIA)    Difficulty of Paying Living Expenses: Not hard at all  Food Insecurity: No Food Insecurity (03/06/2022)   Hunger Vital Sign    Worried About Running Out of Food in the Last Year: Never true    Ran Out of Food in the Last Year: Never true  Transportation Needs: No Transportation Needs (03/06/2022)   PRAPARE - Hydrologist (Medical): No    Lack of Transportation (Non-Medical): No  Physical Activity:  Inactive (03/06/2022)   Exercise Vital Sign    Days of Exercise per Week: 0 days    Minutes of Exercise per Session: 0 min  Stress: No Stress Concern Present (03/06/2022)   [place]    Feeling of Stress : Not at all  Social Connections: Moderately Integrated (03/06/2022)   Social Connection and Isolation Panel [NHANES]    Frequency of Communication with Friends and Family: More than three times a week    Frequency of Social Gatherings with Friends and Family: More than three times a week    Attends Religious Services: 1 to 4 times per year    Active Member of [employer] or Organizations: Yes    Attends Archivist Meetings: 1 to 4 times per year    Marital Status: Divorced  Human resources officer Violence: Not At Risk (03/06/2022)   Humiliation, Afraid, Rape, and Kick questionnaire    Fear of Current or Ex-Partner: No    Emotionally Abused: No    Physically Abused: No    Sexually Abused: No    Family History  Problem Relation Age of Onset   Stroke Mother 80   Depression Mother    Hypertension Father    Heart disease Father    Colon polyps Father    Pancreatic cancer Brother    Colon polyps Brother    Throat cancer Brother    Breast cancer Daughter    Hypertension Other    Colon cancer Neg Hx    Esophageal cancer Neg Hx    Stomach cancer Neg Hx    Rectal cancer Neg Hx     Review of Systems:  As stated in the HPI and otherwise negative.   BP (!) 140/82   Pulse 91   Ht '6\' 4"'$  (1.93 m)   Wt (!) 304 lb 9.6 oz (138.2 kg)   SpO2 97%   BMI 37.08 kg/m   Physical Examination: General: Well developed, well nourished, NAD  HEENT: OP clear, mucus membranes moist  SKIN: warm, dry. No rashes. Neuro: No focal deficits  Musculoskeletal: Muscle strength 5/5 all ext  Psychiatric: Mood and affect normal  Neck: No JVD, no carotid bruits, no thyromegaly, no lymphadenopathy.  Lungs:Clear bilaterally, no wheezes, rhonci,  crackles Cardiovascular: Regular rate and rhythm. No murmurs, gallops or rubs. Abdomen:Soft. Bowel sounds present. Non-tender.  Extremities: No lower extremity edema. Pulses are 2 + in the bilateral DP/PT.  EKG:  EKG is ordered today. The ekg ordered today demonstrates NSR, LVH  Recent Labs: 06/05/2022: ALT 35; BUN 15; Creatinine, Ser 0.80; Hemoglobin 15.1; Platelets 204.0; Potassium 4.1; Sodium 136; TSH 2.37   Lipid  Panel    Component Value Date/Time   CHOL 183 10/27/2020 0957   CHOL 170 06/16/2019 1035   TRIG 111.0 10/27/2020 0957   HDL 29.50 (L) 10/27/2020 0957   HDL 32 (L) 06/16/2019 1035   CHOLHDL 6 10/27/2020 0957   VLDL 22.2 10/27/2020 0957   LDLCALC 132 (H) 10/27/2020 0957   LDLCALC 121 (H) 06/16/2019 1035   LDLDIRECT 158.5 10/19/2008 0858     Wt Readings from Last 3 Encounters:  07/27/22 (!) 304 lb 9.6 oz (138.2 kg)  06/13/22 296 lb (134.3 kg)  06/05/22 299 lb 3.2 oz (135.7 kg)    Assessment and Plan:   1. CAD with angina: He has ongoing dyspnea and fatigue. Recent Coronary CTA with severe disease in the LAD and RCA. Cardiac cath is indicated.  I have reviewed the risks, indications, and alternatives to cardiac catheterization, possible angioplasty, and stenting with the patient. Risks include but are not limited to bleeding, infection, vascular injury, stroke, myocardial infection, arrhythmia, kidney injury, radiation-related injury in the case of prolonged fluoroscopy use, emergency cardiac surgery, and death. The patient understands the risks of serious complication is 1-2 in 4628 with diagnostic cardiac cath and 1-2% or less with angioplasty/stenting.  -Will plan cardiac cath at [facility] on 08/01/22 at 8:30 am. -BMET and CBC today   2. HTN: BP is stable. No changes  Labs/ tests ordered today include:   Orders Placed This Encounter  Procedures   CBC   Basic metabolic panel   EKG 63-OTRR   Disposition:   F/U with me after his cath  Signed, Lauree Chandler,  MD 07/27/2022 10:14 AM    [facility] [address], [place], [place]  [address] Phone: [phone]; Fax: [phone]

## 2022-07-27 ENCOUNTER — Encounter: Payer: Self-pay | Admitting: Cardiovascular Disease

## 2022-07-27 ENCOUNTER — Ambulatory Visit: Payer: Medicare HMO | Attending: Cardiovascular Disease | Admitting: Cardiovascular Disease

## 2022-07-27 VITALS — BP 140/82 | HR 91 | Ht 76.0 in | Wt 304.6 lb

## 2022-07-27 DIAGNOSIS — I25118 Atherosclerotic heart disease of native coronary artery with other forms of angina pectoris: Secondary | ICD-10-CM

## 2022-07-27 DIAGNOSIS — I10 Essential (primary) hypertension: Secondary | ICD-10-CM | POA: Diagnosis not present

## 2022-07-27 DIAGNOSIS — Z01812 Encounter for preprocedural laboratory examination: Secondary | ICD-10-CM | POA: Diagnosis not present

## 2022-07-27 NOTE — Patient Instructions (Addendum)
Medication Instructions:  No changes *If you need a refill on your cardiac medications before your next appointment, please call your pharmacy*   Lab Work: Today: bmet, cbc  Testing/Procedures: Your physician has requested that you have a cardiac catheterization. Cardiac catheterization is used to diagnose and/or treat various heart conditions. Doctors may recommend this procedure for a number of different reasons. The most common reason is to evaluate chest pain. Chest pain can be a symptom of coronary artery disease (CAD), and cardiac catheterization can show whether plaque is narrowing or blocking your heart's arteries. This procedure is also used to evaluate the valves, as well as measure the blood flow and oxygen levels in different parts of your heart. For further information please visit [email or web address]. Please follow instruction sheet, as given.   Follow-Up: At [facility], you and your health needs are our priority.  As part of our continuing mission to provide you with exceptional heart care, we have created designated Provider Care Teams.  These Care Teams include your primary Cardiologist (physician) and Advanced Practice Providers (APPs -  Physician Assistants and Nurse Practitioners) who all work together to provide you with the care you need, when you need it.   Your next appointment:   2-3 week(s)---Nov 6 10:40 am  The format for your next appointment:   In Person  Provider:   Lauree Chandler, MD   or Advanced Practice Practitioner (PA or NP)         Cardiac/Peripheral Catheterization   You are scheduled for a Cardiac Catheterization on Wednesday, October 25 with Dr. Lauree Chandler.  1. Please arrive at the Main Entrance A at [facility]: [place],  [address] on October 25 at 6:30 AM (This time is two hours before your procedure to ensure your preparation). Free valet parking service is available. You will check  in at ADMITTING. The support person will be asked to wait in the waiting room.  It is OK to have someone drop you off and come back when you are ready to be discharged.        Special note: Every effort is made to have your procedure done on time. Please understand that emergencies sometimes delay scheduled procedures.   . 2. Diet: Do not eat solid foods after midnight.  You may have clear liquids until 5 AM the day of the procedure.  3. Labs: You will need to have blood drawn today.  You do not need to be fasting.  4. Medication instructions in preparation for your procedure:   Contrast Allergy: No   On the morning of your procedure, take Aspirin 81 mg and any morning medicines NOT listed above.  You may use sips of water.  5. Plan to go home the same day, you will only stay overnight if medically necessary. 6. You MUST have a responsible adult to drive you home. 7. An adult MUST be with you the first 24 hours after you arrive home. 8. Bring a current list of your medications, and the last time and date medication taken. 9. Bring ID and current insurance cards. 10.Please wear clothes that are easy to get on and off and wear slip-on shoes.  Thank you for allowing [place] to care for you!   -- [facility]

## 2022-07-28 LAB — BASIC METABOLIC PANEL
BUN/Creatinine Ratio: 13 (ref 10–24)
BUN: 12 mg/dL (ref 8–27)
CO2: 25 mmol/L (ref 20–29)
Calcium: 9.9 mg/dL (ref 8.6–10.2)
Chloride: 101 mmol/L (ref 96–106)
Creatinine, Ser: 0.94 mg/dL (ref 0.76–1.27)
Glucose: 134 mg/dL — ABNORMAL HIGH (ref 70–99)
Potassium: 3.9 mmol/L (ref 3.5–5.2)
Sodium: 139 mmol/L (ref 134–144)
eGFR: 86 mL/min/{1.73_m2} (ref >59)

## 2022-07-28 LAB — CBC
Hematocrit: 43.5 % (ref 37.5–51.0)
Hemoglobin: 15.1 g/dL (ref 13.0–17.7)
MCH: 32.9 pg (ref 26.6–33.0)
MCHC: 34.7 g/dL (ref 31.5–35.7)
MCV: 95 fL (ref 79–97)
Platelets: 165 10*3/uL (ref 150–450)
RBC: 4.59 x10E6/uL (ref 4.14–5.80)
RDW: 13.2 % (ref 11.6–15.4)
WBC: 5.9 10*3/uL (ref 3.4–10.8)

## 2022-07-30 ENCOUNTER — Telehealth: Payer: Self-pay | Admitting: *Deleted

## 2022-07-30 NOTE — Telephone Encounter (Signed)
Cardiac Catheterization scheduled at [facility] for: Wednesday August 01, 2022 8:30 AM Arrival time and place: [place] Entrance A at: 6:30 AM  Nothing to eat after midnight prior to procedure, clear liquids until 5 AM day of procedure.  Medication instructions: -Usual morning medications can be taken with sips of water including aspirin 81 mg.  Confirmed patient has responsible adult to drive home post procedure and be with patient first 24 hours after arriving home.  Patient reports no new symptoms concerning for COVID-19 in the past 10 days.  Reviewed procedure instructions with patient.

## 2022-08-01 ENCOUNTER — Encounter (HOSPITAL_COMMUNITY): Payer: Self-pay | Admitting: Cardiovascular Disease

## 2022-08-01 ENCOUNTER — Inpatient Hospital Stay (HOSPITAL_COMMUNITY)
Admission: AD | Admit: 2022-08-01 | Discharge: 2022-08-18 | DRG: 233 | Disposition: A | Discharge status: Home Health | Payer: Medicare HMO | Source: Ambulatory Visit | Attending: Surgery | Admitting: Surgery

## 2022-08-01 ENCOUNTER — Encounter (HOSPITAL_COMMUNITY)
Admission: AD | Disposition: A | Discharge status: Home Health | Payer: Self-pay | Source: Ambulatory Visit | Attending: Surgery

## 2022-08-01 DIAGNOSIS — J811 Chronic pulmonary edema: Secondary | ICD-10-CM | POA: Diagnosis not present

## 2022-08-01 DIAGNOSIS — I4892 Unspecified atrial flutter: Secondary | ICD-10-CM | POA: Diagnosis present

## 2022-08-01 DIAGNOSIS — E7800 Pure hypercholesterolemia, unspecified: Secondary | ICD-10-CM | POA: Diagnosis not present

## 2022-08-01 DIAGNOSIS — I4891 Unspecified atrial fibrillation: Secondary | ICD-10-CM | POA: Diagnosis not present

## 2022-08-01 DIAGNOSIS — I25118 Atherosclerotic heart disease of native coronary artery with other forms of angina pectoris: Secondary | ICD-10-CM

## 2022-08-01 DIAGNOSIS — D62 Acute posthemorrhagic anemia: Secondary | ICD-10-CM | POA: Diagnosis not present

## 2022-08-01 DIAGNOSIS — Q676 Pectus excavatum: Secondary | ICD-10-CM

## 2022-08-01 DIAGNOSIS — Z803 Family history of malignant neoplasm of breast: Secondary | ICD-10-CM

## 2022-08-01 DIAGNOSIS — Z602 Problems related to living alone: Secondary | ICD-10-CM | POA: Diagnosis present

## 2022-08-01 DIAGNOSIS — I4819 Other persistent atrial fibrillation: Secondary | ICD-10-CM | POA: Diagnosis present

## 2022-08-01 DIAGNOSIS — I082 Rheumatic disorders of both aortic and tricuspid valves: Secondary | ICD-10-CM | POA: Diagnosis not present

## 2022-08-01 DIAGNOSIS — T461X5A Adverse effect of calcium-channel blockers, initial encounter: Secondary | ICD-10-CM | POA: Diagnosis not present

## 2022-08-01 DIAGNOSIS — Z79899 Other long term (current) drug therapy: Secondary | ICD-10-CM | POA: Diagnosis not present

## 2022-08-01 DIAGNOSIS — M1990 Unspecified osteoarthritis, unspecified site: Secondary | ICD-10-CM | POA: Diagnosis present

## 2022-08-01 DIAGNOSIS — J9811 Atelectasis: Secondary | ICD-10-CM | POA: Diagnosis not present

## 2022-08-01 DIAGNOSIS — E785 Hyperlipidemia, unspecified: Secondary | ICD-10-CM | POA: Diagnosis present

## 2022-08-01 DIAGNOSIS — T462X5A Adverse effect of other antidysrhythmic drugs, initial encounter: Secondary | ICD-10-CM | POA: Diagnosis not present

## 2022-08-01 DIAGNOSIS — J9691 Respiratory failure, unspecified with hypoxia: Secondary | ICD-10-CM | POA: Diagnosis not present

## 2022-08-01 DIAGNOSIS — N400 Enlarged prostate without lower urinary tract symptoms: Secondary | ICD-10-CM | POA: Diagnosis present

## 2022-08-01 DIAGNOSIS — H9190 Unspecified hearing loss, unspecified ear: Secondary | ICD-10-CM | POA: Diagnosis present

## 2022-08-01 DIAGNOSIS — Z01810 Encounter for preprocedural cardiovascular examination: Secondary | ICD-10-CM | POA: Diagnosis not present

## 2022-08-01 DIAGNOSIS — I440 Atrioventricular block, first degree: Secondary | ICD-10-CM | POA: Diagnosis present

## 2022-08-01 DIAGNOSIS — Z1152 Encounter for screening for COVID-19: Secondary | ICD-10-CM

## 2022-08-01 DIAGNOSIS — Z888 Allergy status to other drugs, medicaments and biological substances status: Secondary | ICD-10-CM

## 2022-08-01 DIAGNOSIS — I25110 Atherosclerotic heart disease of native coronary artery with unstable angina pectoris: Secondary | ICD-10-CM | POA: Diagnosis not present

## 2022-08-01 DIAGNOSIS — Z951 Presence of aortocoronary bypass graft: Principal | ICD-10-CM

## 2022-08-01 DIAGNOSIS — I10 Essential (primary) hypertension: Secondary | ICD-10-CM | POA: Diagnosis not present

## 2022-08-01 DIAGNOSIS — I200 Unstable angina: Secondary | ICD-10-CM | POA: Diagnosis not present

## 2022-08-01 DIAGNOSIS — Z808 Family history of malignant neoplasm of other organs or systems: Secondary | ICD-10-CM | POA: Diagnosis not present

## 2022-08-01 DIAGNOSIS — G4733 Obstructive sleep apnea (adult) (pediatric): Secondary | ICD-10-CM | POA: Diagnosis present

## 2022-08-01 DIAGNOSIS — I25119 Atherosclerotic heart disease of native coronary artery with unspecified angina pectoris: Secondary | ICD-10-CM | POA: Diagnosis not present

## 2022-08-01 DIAGNOSIS — E669 Obesity, unspecified: Secondary | ICD-10-CM | POA: Diagnosis not present

## 2022-08-01 DIAGNOSIS — Z960 Presence of urogenital implants: Secondary | ICD-10-CM | POA: Diagnosis present

## 2022-08-01 DIAGNOSIS — R42 Dizziness and giddiness: Secondary | ICD-10-CM | POA: Diagnosis not present

## 2022-08-01 DIAGNOSIS — F32A Depression, unspecified: Secondary | ICD-10-CM | POA: Diagnosis present

## 2022-08-01 DIAGNOSIS — Z791 Long term (current) use of non-steroidal anti-inflammatories (NSAID): Secondary | ICD-10-CM

## 2022-08-01 DIAGNOSIS — Y92239 Unspecified place in hospital as the place of occurrence of the external cause: Secondary | ICD-10-CM | POA: Diagnosis not present

## 2022-08-01 DIAGNOSIS — E871 Hypo-osmolality and hyponatremia: Secondary | ICD-10-CM | POA: Diagnosis not present

## 2022-08-01 DIAGNOSIS — Z83719 Family history of colon polyps, unspecified: Secondary | ICD-10-CM

## 2022-08-01 DIAGNOSIS — I700 Atherosclerosis of aorta: Secondary | ICD-10-CM | POA: Diagnosis not present

## 2022-08-01 DIAGNOSIS — I808 Phlebitis and thrombophlebitis of other sites: Secondary | ICD-10-CM | POA: Diagnosis not present

## 2022-08-01 DIAGNOSIS — I447 Left bundle-branch block, unspecified: Secondary | ICD-10-CM | POA: Diagnosis not present

## 2022-08-01 DIAGNOSIS — G4730 Sleep apnea, unspecified: Secondary | ICD-10-CM | POA: Diagnosis not present

## 2022-08-01 DIAGNOSIS — J90 Pleural effusion, not elsewhere classified: Secondary | ICD-10-CM | POA: Diagnosis not present

## 2022-08-01 DIAGNOSIS — Z9989 Dependence on other enabling machines and devices: Secondary | ICD-10-CM | POA: Diagnosis not present

## 2022-08-01 DIAGNOSIS — Z823 Family history of stroke: Secondary | ICD-10-CM

## 2022-08-01 DIAGNOSIS — M545 Low back pain, unspecified: Secondary | ICD-10-CM | POA: Diagnosis present

## 2022-08-01 DIAGNOSIS — Z6837 Body mass index (BMI) 37.0-37.9, adult: Secondary | ICD-10-CM

## 2022-08-01 DIAGNOSIS — Z818 Family history of other mental and behavioral disorders: Secondary | ICD-10-CM

## 2022-08-01 DIAGNOSIS — I480 Paroxysmal atrial fibrillation: Secondary | ICD-10-CM

## 2022-08-01 DIAGNOSIS — I350 Nonrheumatic aortic (valve) stenosis: Secondary | ICD-10-CM | POA: Diagnosis not present

## 2022-08-01 DIAGNOSIS — R69 Illness, unspecified: Secondary | ICD-10-CM | POA: Diagnosis not present

## 2022-08-01 DIAGNOSIS — R0602 Shortness of breath: Secondary | ICD-10-CM | POA: Diagnosis not present

## 2022-08-01 DIAGNOSIS — K219 Gastro-esophageal reflux disease without esophagitis: Secondary | ICD-10-CM | POA: Diagnosis present

## 2022-08-01 DIAGNOSIS — Z800 Family history of malignant neoplasm of digestive organs: Secondary | ICD-10-CM

## 2022-08-01 DIAGNOSIS — Z86010 Personal history of colonic polyps: Secondary | ICD-10-CM

## 2022-08-01 DIAGNOSIS — I2510 Atherosclerotic heart disease of native coronary artery without angina pectoris: Secondary | ICD-10-CM | POA: Diagnosis not present

## 2022-08-01 DIAGNOSIS — E8770 Fluid overload, unspecified: Secondary | ICD-10-CM | POA: Diagnosis not present

## 2022-08-01 DIAGNOSIS — Z8249 Family history of ischemic heart disease and other diseases of the circulatory system: Secondary | ICD-10-CM

## 2022-08-01 HISTORY — PX: LEFT HEART CATH AND CORONARY ANGIOGRAPHY: CATH118249

## 2022-08-01 LAB — CREATININE, SERUM
Creatinine, Ser: 0.74 mg/dL (ref 0.61–1.24)
GFR, Estimated: >60 mL/min (ref >60)

## 2022-08-01 LAB — CBC
HCT: 40.7 % (ref 39.0–52.0)
Hemoglobin: 13.7 g/dL (ref 13.0–17.0)
MCH: 32.8 pg (ref 26.0–34.0)
MCHC: 33.7 g/dL (ref 30.0–36.0)
MCV: 97.4 fL (ref 80.0–100.0)
Platelets: 140 10*3/uL — ABNORMAL LOW (ref 150–400)
RBC: 4.18 MIL/uL — ABNORMAL LOW (ref 4.22–5.81)
RDW: 13.6 % (ref 11.5–15.5)
WBC: 4.4 10*3/uL (ref 4.0–10.5)
nRBC: 0 % (ref 0.0–0.2)

## 2022-08-01 SURGERY — LEFT HEART CATH AND CORONARY ANGIOGRAPHY
Anesthesia: LOCAL

## 2022-08-01 MED ORDER — ASPIRIN 81 MG PO CHEW: 81.0000 mg | CHEWABLE_TABLET | ORAL | Status: DC

## 2022-08-01 MED ORDER — METOPROLOL TARTRATE 12.5 MG HALF TABLET
25.0000 mg | ORAL_TABLET | Freq: Two times a day (BID) | ORAL | Status: DC
  Administered 2022-08-01 – 2022-08-02 (×3): 25 mg via ORAL
  Filled 2022-08-01 (×3): qty 2

## 2022-08-01 MED ORDER — HYDRALAZINE HCL 20 MG/ML IJ SOLN
INTRAMUSCULAR | Status: AC
  Filled 2022-08-01: qty 1

## 2022-08-01 MED ORDER — LABETALOL HCL 5 MG/ML IV SOLN
INTRAVENOUS | Status: AC
  Filled 2022-08-01: qty 4

## 2022-08-01 MED ORDER — OXYCODONE HCL 5 MG PO TABS: 5.0000 mg | ORAL_TABLET | ORAL | Status: DC | PRN

## 2022-08-01 MED ORDER — SODIUM CHLORIDE 0.9% FLUSH
3.0000 mL | Freq: Two times a day (BID) | INTRAVENOUS | Status: DC
  Administered 2022-08-01 – 2022-08-02 (×3): 3 mL via INTRAVENOUS

## 2022-08-01 MED ORDER — ATORVASTATIN CALCIUM 80 MG PO TABS
80.0000 mg | ORAL_TABLET | Freq: Every day | ORAL | Status: DC
  Administered 2022-08-01 – 2022-08-18 (×17): 80 mg via ORAL
  Filled 2022-08-01 (×17): qty 1

## 2022-08-01 MED ORDER — MIDAZOLAM HCL 2 MG/2ML IJ SOLN
INTRAMUSCULAR | Status: AC
  Filled 2022-08-01: qty 2

## 2022-08-01 MED ORDER — PANTOPRAZOLE SODIUM 40 MG PO TBEC
40.0000 mg | DELAYED_RELEASE_TABLET | Freq: Every evening | ORAL | Status: DC
  Administered 2022-08-01 – 2022-08-02 (×2): 40 mg via ORAL
  Filled 2022-08-01 (×3): qty 1

## 2022-08-01 MED ORDER — ONDANSETRON HCL 4 MG/2ML IJ SOLN: 4.0000 mg | Freq: Four times a day (QID) | INTRAMUSCULAR | Status: DC | PRN

## 2022-08-01 MED ORDER — LIDOCAINE HCL (PF) 1 % IJ SOLN
INTRAMUSCULAR | Status: DC | PRN
  Administered 2022-08-01: 2 mL

## 2022-08-01 MED ORDER — ENOXAPARIN SODIUM 40 MG/0.4ML IJ SOSY
40.0000 mg | PREFILLED_SYRINGE | INTRAMUSCULAR | Status: DC
  Administered 2022-08-02: 40 mg via SUBCUTANEOUS
  Filled 2022-08-01: qty 0.4

## 2022-08-01 MED ORDER — VERAPAMIL HCL 2.5 MG/ML IV SOLN
INTRAVENOUS | Status: AC
  Filled 2022-08-01: qty 2

## 2022-08-01 MED ORDER — MIDAZOLAM HCL 2 MG/2ML IJ SOLN
INTRAMUSCULAR | Status: DC | PRN
  Administered 2022-08-01: 1 mg via INTRAVENOUS

## 2022-08-01 MED ORDER — SODIUM CHLORIDE 0.9 % IV SOLN: 250.0000 mL | INTRAVENOUS | Status: DC | PRN

## 2022-08-01 MED ORDER — ACETAMINOPHEN 325 MG PO TABS: 650.0000 mg | ORAL_TABLET | ORAL | Status: DC | PRN

## 2022-08-01 MED ORDER — SODIUM CHLORIDE 0.9% FLUSH: 3.0000 mL | INTRAVENOUS | Status: DC | PRN

## 2022-08-01 MED ORDER — MORPHINE SULFATE (PF) 2 MG/ML IV SOLN: 2.0000 mg | INTRAVENOUS | Status: DC | PRN

## 2022-08-01 MED ORDER — SODIUM CHLORIDE 0.9 % WEIGHT BASED INFUSION
3.0000 mL/kg/h | INTRAVENOUS | Status: DC
  Administered 2022-08-01: 3 mL/kg/h via INTRAVENOUS

## 2022-08-01 MED ORDER — LABETALOL HCL 5 MG/ML IV SOLN
10.0000 mg | INTRAVENOUS | Status: AC | PRN
  Administered 2022-08-01 (×2): 10 mg via INTRAVENOUS

## 2022-08-01 MED ORDER — FENTANYL CITRATE (PF) 100 MCG/2ML IJ SOLN
INTRAMUSCULAR | Status: DC | PRN
  Administered 2022-08-01: 25 ug via INTRAVENOUS

## 2022-08-01 MED ORDER — SODIUM CHLORIDE 0.9 % IV SOLN: INTRAVENOUS | Status: AC

## 2022-08-01 MED ORDER — HEPARIN (PORCINE) IN NACL 1000-0.9 UT/500ML-% IV SOLN
INTRAVENOUS | Status: AC
  Filled 2022-08-01: qty 1000

## 2022-08-01 MED ORDER — VERAPAMIL HCL 2.5 MG/ML IV SOLN
INTRAVENOUS | Status: DC | PRN
  Administered 2022-08-01: 10 mL via INTRA_ARTERIAL

## 2022-08-01 MED ORDER — FENTANYL CITRATE (PF) 100 MCG/2ML IJ SOLN
INTRAMUSCULAR | Status: AC
  Filled 2022-08-01: qty 2

## 2022-08-01 MED ORDER — MELATONIN 3 MG PO TABS
3.0000 mg | ORAL_TABLET | Freq: Every evening | ORAL | Status: DC | PRN
  Administered 2022-08-01 – 2022-08-16 (×10): 3 mg via ORAL
  Filled 2022-08-01 (×10): qty 1

## 2022-08-01 MED ORDER — HYDRALAZINE HCL 20 MG/ML IJ SOLN: 10.0000 mg | INTRAMUSCULAR | Status: AC | PRN

## 2022-08-01 MED ORDER — SODIUM CHLORIDE 0.9 % WEIGHT BASED INFUSION: 1.0000 mL/kg/h | INTRAVENOUS | Status: DC

## 2022-08-01 MED ORDER — LIDOCAINE HCL (PF) 1 % IJ SOLN
INTRAMUSCULAR | Status: AC
  Filled 2022-08-01: qty 30

## 2022-08-01 MED ORDER — SODIUM CHLORIDE 0.9% FLUSH: 3.0000 mL | Freq: Two times a day (BID) | INTRAVENOUS | Status: DC

## 2022-08-01 MED ORDER — HEPARIN SODIUM (PORCINE) 1000 UNIT/ML IJ SOLN
INTRAMUSCULAR | Status: DC | PRN
  Administered 2022-08-01: 6000 [IU] via INTRAVENOUS

## 2022-08-01 MED ORDER — HYDRALAZINE HCL 20 MG/ML IJ SOLN
INTRAMUSCULAR | Status: DC | PRN
  Administered 2022-08-01: 10 mg via INTRAVENOUS

## 2022-08-01 MED ORDER — HEPARIN SODIUM (PORCINE) 1000 UNIT/ML IJ SOLN
INTRAMUSCULAR | Status: AC
  Filled 2022-08-01: qty 10

## 2022-08-01 MED ORDER — ASPIRIN 81 MG PO CHEW
81.0000 mg | CHEWABLE_TABLET | Freq: Every day | ORAL | Status: DC
  Administered 2022-08-02: 81 mg via ORAL
  Filled 2022-08-01: qty 1

## 2022-08-01 MED ORDER — BENAZEPRIL HCL 20 MG PO TABS
40.0000 mg | ORAL_TABLET | Freq: Every day | ORAL | Status: DC
  Administered 2022-08-01 – 2022-08-02 (×2): 40 mg via ORAL
  Filled 2022-08-01 (×3): qty 2

## 2022-08-01 SURGICAL SUPPLY — 10 items
BAND ZEPHYR COMPRESS 30 LONG (HEMOSTASIS) IMPLANT
CATH 5FR JL3.5 JR4 ANG PIG MP (CATHETERS) IMPLANT
CATH INFINITI 5FR JR4 125CM (CATHETERS) IMPLANT
GLIDESHEATH SLEND SS 6F .021 (SHEATH) IMPLANT
GUIDEWIRE INQWIRE 1.5J.035X260 (WIRE) IMPLANT
INQWIRE 1.5J .035X260CM (WIRE) ×1
KIT HEART LEFT (KITS) ×1 IMPLANT
PACK CARDIAC CATHETERIZATION (CUSTOM PROCEDURE TRAY) ×1 IMPLANT
TRANSDUCER W/STOPCOCK (MISCELLANEOUS) ×1 IMPLANT
TUBING CIL FLEX 10 FLL-RA (TUBING) ×1 IMPLANT

## 2022-08-01 NOTE — Consult Note (Addendum)
[address]       [place] [address]             [phone]        Hector Edwards [place] Medical Record #[id number] Date of Birth: [birth year]  Referring: Lauree Chandler, MD Primary Care: [name], FNP Primary Cardiologist:Christopher Angelena Form, MD  Reason for consult:  Left main and 3 vessel coronary artery disease.   History of Present Illness:     Hector Edwards is a 76 year old male with a  past medical history of HTN, HLD, obesity, GERD, depression, sleep apnea (does not tolerate CPAP), CAD and mild aortic stenosis. He presented to the cardiologist on 06/13/22 complaining of worsening dyspnea on exertion and fatigue for about 1 year as well as occasional LE swelling. He was seen by Dr. Irish Lack and underwent an echo in July 2017 which showed mild LVH and no valvular disease. Nuclear stress test was also done in 2017 and showed no ischemia. Echocardiogram on 06/25/19 showed LVEF 55-60% with mild aortic valve sclerosis but no stenosis at this time. Echocardiogram on 07/05/22 showed LVEF 60-65% and mild aortic stenosis with a mean gradient of 12. Coronary CTA on 07/24/22 showed severe disease in the LAD and RCA. He underwent a cardiac catheterization on 08/01/22 which revealed severe distal left main/three vessel CAD. Including severe, heavily calcified eccentric distal left main stenosis 80%, severe mid LAD stenosis 80%, severe ostial Circumflex stenosis 70%, and large dominant RCA with severe ostial stenosis 80%, severe mid to distal RCA stenosis 90%, RPAV 99% stenosis, RPDA 80% stenosis. The patient states overall for the past year he has been increasingly fatigued and short of breath with exertion but is still able to mow the lawn. He denies chest pain, orthopnea, and dizziness. He is currently resting comfortably in bed and denies dyspnea and chest pain. He has  family history of heart disease and MI in his father and brother. He has no history  of varicose veins. 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   Current Activity/ Functional Status: Patient is independent with mobility/ambulation, transfers, ADL's, IADL's.   Zubrod Score: At the time of surgery this patient's most appropriate activity status/level should be described as: '[]'$     0    Normal activity, no symptoms '[x]'$     1    Restricted in physical strenuous activity but ambulatory, able to do out light work '[]'$     2    Ambulatory and capable of self care, unable to do work activities, up and about                 more than 50%  Of the time                            '[]'$     3    Only limited self care, in bed greater than 50% of waking hours '[]'$     4    Completely disabled, no self care, confined to bed or chair '[]'$     5    Moribund  Past Medical History:  Diagnosis Date   Depression    GERD (gastroesophageal reflux disease)    Hx of adenomatous colonic polyps 09/04/2015   Hyperlipidemia    Hypertension    LBP (low back pain)    OSA (obstructive sleep apnea) 08/01/2018   Sleep apnea    wears  cpap     Past Surgical History:  Procedure Laterality Date   COLONOSCOPY  2004, 2021   hyperplastic polyps x 2   KNEE ARTHROSCOPY Right    PENILE PROSTHESIS IMPLANT N/A 01/25/2020   Procedure: PENILE PROTHESIS INFLATABLE COLOPLAST;  Surgeon: Lucas Mallow, MD;  Location: [facility];  Service: Urology;  Laterality: N/A;   SHOULDER ARTHROSCOPY W/ ROTATOR CUFF REPAIR Left    TONSILLECTOMY  as child   vocal cord growth removed  age 68   benign   WISDOM TOOTH EXTRACTION      Social History   Tobacco Use  Smoking Status Never  Smokeless Tobacco Never    Social History   Substance and Sexual Activity  Alcohol Use Yes   Alcohol/week: 4.0 standard drinks of alcohol   Types: 4 Cans of beer per week   Comment: 4 cans/week     Allergies  Allergen Reactions   Hctz [Hydrochlorothiazide] Shortness Of Breath   Enalapril Maleate      cramps   Tribenzor [Olmesartan-Amlodipine-Hctz] Swelling    Fatigue, CP, edema   Verapamil     Fatigue    Current Facility-Administered Medications  Medication Dose Route Frequency Provider Last Rate Last Admin   0.9 %  sodium chloride infusion  250 mL Intravenous PRN Burnell Blanks, MD       0.9 %  sodium chloride infusion   Intravenous Continuous Burnell Blanks, MD 75 mL/hr at 08/01/22 0912 New Bag at 08/01/22 0912   0.9% sodium chloride infusion  1 mL/kg/hr Intravenous Continuous Burnell Blanks, MD 138.2 mL/hr at 08/01/22 0808 1 mL/kg/hr at 08/01/22 6734   acetaminophen (TYLENOL) tablet 650 mg  650 mg Oral Q4H PRN Burnell Blanks, MD       aspirin chewable tablet 81 mg  81 mg Oral Pre-Cath Burnell Blanks, MD       [START ON 08/02/2022] aspirin chewable tablet 81 mg  81 mg Oral Daily Burnell Blanks, MD       atorvastatin (LIPITOR) tablet 80 mg  80 mg Oral Daily Burnell Blanks, MD   80 mg at 08/01/22 1041   benazepril (LOTENSIN) tablet 40 mg  40 mg Oral Daily Burnell Blanks, MD   40 mg at 08/01/22 1042   fentaNYL (SUBLIMAZE) injection    PRN Burnell Blanks, MD   25 mcg at 08/01/22 0830   heparin sodium (porcine) injection    PRN Burnell Blanks, MD   6,000 Units at 08/01/22 0840   hydrALAZINE (APRESOLINE) injection 10 mg  10 mg Intravenous Q20 Min PRN Burnell Blanks, MD       hydrALAZINE (APRESOLINE) injection    PRN Burnell Blanks, MD   10 mg at 08/01/22 0853   labetalol (NORMODYNE) injection 10 mg  10 mg Intravenous Q10 min PRN Burnell Blanks, MD   10 mg at 08/01/22 1039   lidocaine (PF) (XYLOCAINE) 1 % injection    PRN Burnell Blanks, MD   2 mL at 08/01/22 0837   metoprolol tartrate (LOPRESSOR) tablet 25 mg  25 mg Oral BID Burnell Blanks, MD       midazolam (VERSED) injection    PRN Burnell Blanks, MD   1 mg at 08/01/22 0830   morphine (PF) 2  MG/ML injection 2 mg  2 mg Intravenous Q1H PRN Burnell Blanks, MD       ondansetron (ZOFRAN) injection 4 mg  4 mg  Intravenous Q6H PRN Burnell Blanks, MD       oxyCODONE (Oxy IR/ROXICODONE) immediate release tablet 5-10 mg  5-10 mg Oral Q4H PRN Burnell Blanks, MD       pantoprazole (PROTONIX) EC tablet 40 mg  40 mg Oral QPM Burnell Blanks, MD       Radial Cocktail/Verapamil only    PRN Burnell Blanks, MD   10 mL at 08/01/22 4166   sodium chloride flush (NS) 0.9 % injection 3 mL  3 mL Intravenous Q12H Burnell Blanks, MD       sodium chloride flush (NS) 0.9 % injection 3 mL  3 mL Intravenous PRN Burnell Blanks, MD        Medications Prior to Admission  Medication Sig Dispense Refill Last Dose   benazepril (LOTENSIN) 20 MG tablet TAKE TWO TABLETS BY MOUTH EVERYDAY AT BEDTIME 60 tablet 0 07/31/2022   pantoprazole (PROTONIX) 40 MG tablet TAKE ONE TABLET BY MOUTH EVERY EVENING 30 tablet 0 07/31/2022    Family History  Problem Relation Age of Onset   Stroke Mother 8   Depression Mother    Hypertension Father    Heart disease Father    Colon polyps Father    Pancreatic cancer Brother    Colon polyps Brother    Throat cancer Brother    Breast cancer Daughter    Hypertension Other    Colon cancer Neg Hx    Esophageal cancer Neg Hx    Stomach cancer Neg Hx    Rectal cancer Neg Hx      Review of Systems:   Review of Systems  Constitutional:  Positive for malaise/fatigue. Negative for chills, diaphoresis, fever and weight loss.  HENT:  Positive for hearing loss. Negative for tinnitus.        Hearing aids   Eyes:  Negative for blurred vision and double vision.  Respiratory:  Positive for cough, shortness of breath and wheezing. Negative for sputum production.        Sleep apnea, does not use CPAP  Cardiovascular:  Positive for leg swelling. Negative for chest pain, palpitations and orthopnea.  Gastrointestinal:  Positive  for heartburn. Negative for abdominal pain.  Genitourinary:  Negative for dysuria.  Musculoskeletal:  Positive for back pain.  Skin:  Negative for rash.  Neurological:  Negative for dizziness, loss of consciousness and weakness.  Endo/Heme/Allergies:  Does not bruise/bleed easily.  Psychiatric/Behavioral:  Negative for depression. The patient is not nervous/anxious.         Physical Exam: BP (!) 177/84   Pulse 76   Temp 97.6 F (36.4 C) (Temporal)   Resp 15   Ht '6\' 4"'$  (1.93 m)   Wt 136.1 kg   SpO2 98%   BMI 36.52 kg/m    General appearance: alert, cooperative, and no distress Head: Normocephalic, without obvious abnormality, atraumatic Neck: no adenopathy, no carotid bruit, no JVD, supple, symmetrical, trachea midline, and thyroid not enlarged, symmetric, no tenderness/mass/nodules Lymph nodes: Cervical, supraclavicular, and axillary nodes normal. Resp: clear to auscultation bilaterally Cardio: regular rate and rhythm, no murmur, rub or gallop GI: soft, non-tender; bowel sounds normal; no masses,  no organomegaly Extremities: extremities normal, atraumatic, no cyanosis or edema. DP/PT pulses 2+ bilaterally, radial pulses 2+ bilaterally Neurologic: Grossly normal   Diagnostic Studies & Laboratory data:     Recent Radiology Findings:  LEFT HEART CATH AND CORONARY ANGIOGRAPHY     Mid RCA to Dist RCA lesion is 90% stenosed.  Ost RCA to Prox RCA lesion is 80% stenosed.   RPAV lesion is 99% stenosed.   RPDA lesion is 80% stenosed.   Mid LM to Dist LM lesion is 80% stenosed.   Ost Cx to Prox Cx lesion is 70% stenosed.   Dist LM to Ost LAD lesion is 80% stenosed.   Prox LAD to Mid LAD lesion is 60% stenosed.   Mid LAD lesion is 80% stenosed.   Severe distal left main/three vessel CAD Severe, heavily calcified eccentric distal left main stenosis Severe ostial LAD stenosis. Severe mid LAD stenosis Severe ostial Circumflex stenosis.  Large dominant RCA with severe ostial  stenosis. Severe mid to distal vessel stenosis. Severe stenosis in the proximal posterolateral artery. Severe ostial PDA stenosis.  LVEDP 18 mmHg   Dominance: Right   ECHOCARDIOGRAM REPORT         Patient Name:   Hector Edwards Date of Exam: 07/05/2022  Medical Rec #:  [id number]        Height:       76.0 in  Accession #:    [id number]       Weight:       296.0 lb  Date of Birth:  1946/03/03       BSA:          2.617 m  Patient Age:    13 years         BP:           155/110 mmHg  Patient Gender: M                HR:           90 bpm.  Exam Location:  [place]   Procedure: 2D Echo, Cardiac Doppler, Color Doppler and Strain Analysis   Indications:    R06.00 Dyspnea     History:        Patient has prior history of Echocardiogram examinations,  most                  recent 06/25/2019. Signs/Symptoms:Fatigue; Risk                  Factors:Hypertension, Sleep Apnea and HLD.     Sonographer:    Marygrace Drought RCS  Referring Phys: [place]     1. Mild concentric LVH with proximal septal thickening. Thickened aortic  valve with mild AS (mean gradient 12 mmHg).   2. Left ventricular ejection fraction, by estimation, is 60 to 65%. The  left ventricle has normal function. The left ventricle has no regional  wall motion abnormalities. There is moderate left ventricular hypertrophy  of the basal-septal segment. Left  ventricular diastolic parameters are consistent with Grade I diastolic  dysfunction (impaired relaxation).   3. Right ventricular systolic function is normal. The right ventricular  size is normal.   4. The mitral valve is normal in structure. No evidence of mitral valve  regurgitation. No evidence of mitral stenosis.   5. The aortic valve is tricuspid. Aortic valve regurgitation is not  visualized. Mild aortic valve stenosis.   6. Aortic dilatation noted. There is mild dilatation of the aortic root,  measuring 42 mm.   7. The  inferior vena cava is normal in size with greater than 50%  respiratory variability, suggesting right atrial pressure of 3 mmHg.   FINDINGS   Left Ventricle: Left ventricular ejection fraction, by estimation, is 60  to 65%. The left  ventricle has normal function. The left ventricle has no  regional wall motion abnormalities. The left ventricular internal cavity  size was normal in size. There is   moderate left ventricular hypertrophy of the basal-septal segment. Left  ventricular diastolic parameters are consistent with Grade I diastolic  dysfunction (impaired relaxation).   Right Ventricle: The right ventricular size is normal. Right ventricular  systolic function is normal.   Left Atrium: Left atrial size was normal in size.   Right Atrium: Right atrial size was normal in size.   Pericardium: There is no evidence of pericardial effusion.   Mitral Valve: The mitral valve is normal in structure. No evidence of  mitral valve regurgitation. No evidence of mitral valve stenosis.   Tricuspid Valve: The tricuspid valve is normal in structure. Tricuspid  valve regurgitation is not demonstrated. No evidence of tricuspid  stenosis.   Aortic Valve: The aortic valve is tricuspid. Aortic valve regurgitation is  not visualized. Mild aortic stenosis is present. Aortic valve mean  gradient measures 12.0 mmHg. Aortic valve peak gradient measures 18.8  mmHg. Aortic valve area, by VTI measures  2.56 cm.   Pulmonic Valve: The pulmonic valve was normal in structure. Pulmonic valve  regurgitation is trivial. No evidence of pulmonic stenosis.   Aorta: Aortic dilatation noted. There is mild dilatation of the aortic  root, measuring 42 mm.   Venous: The inferior vena cava is normal in size with greater than 50%  respiratory variability, suggesting right atrial pressure of 3 mmHg.   IAS/Shunts: No atrial level shunt detected by color flow Doppler.   Additional Comments: Mild concentric LVH  with proximal septal thickening.  Thickened aortic valve with mild AS (mean gradient 12 mmHg).      LEFT VENTRICLE  PLAX 2D  LVIDd:         3.80 cm   Diastology  LV PW:         1.30 cm   LV e' medial:    5.33 cm/s  LV IVS:        1.60 cm   LV E/e' medial:  11.1  LVOT diam:     2.30 cm   LV e' lateral:   7.18 cm/s  LV SV:         101       LV E/e' lateral: 8.2  LV SV Index:   38  LVOT Area:     4.15 cm      RIGHT VENTRICLE  RV Basal diam:  3.00 cm  RV S prime:     14.30 cm/s   LEFT ATRIUM             Index        RIGHT ATRIUM           Index  LA Vol (A2C):   54.2 ml 20.71 ml/m  RA Area:     11.30 cm  LA Vol (A4C):   33.3 ml 12.73 ml/m  RA Volume:   20.10 ml  7.68 ml/m  LA Biplane Vol: 43.5 ml 16.62 ml/m   AORTIC VALVE  AV Area (Vmax):    2.60 cm  AV Area (Vmean):   2.39 cm  AV Area (VTI):     2.56 cm  AV Vmax:           217.00 cm/s  AV Vmean:          165.000 cm/s  AV VTI:  0.393 m  AV Peak Grad:      18.8 mmHg  AV Mean Grad:      12.0 mmHg  LVOT Vmax:         136.00 cm/s  LVOT Vmean:        94.800 cm/s  LVOT VTI:          0.242 m  LVOT/AV VTI ratio: 0.62     AORTA  Ao Root diam: 4.20 cm  Ao Asc diam:  3.50 cm   MITRAL VALVE  MV Area (PHT):              SHUNTS  MV Decel Time:              Systemic VTI:  0.24 m  MV E velocity: 59.20 cm/s   Systemic Diam: 2.30 cm  MV A velocity: 113.00 cm/s  MV E/A ratio:  0.52   Kirk Ruths MD  Electronically signed by Kirk Ruths MD  Signature Date/Time: 07/05/2022/12:13:11 PM         Final    Impression/Plan:  CAD and aortic stenosis: Patient has severe multivessel disease including severe left main disease as well as mild aortic stenosis. The patient is willing to undergo CABG surgery. Currently on Beta blocker, ASA and statin therapy. Patient is not on antiplatelet or anticoagulation therapy. Dr. Cyndia Bent will review the patient's chart, labs and diagnostic studies to determine if he is a candidate for  surgical revascularization.   [name], PA-C   Chart reviewed, patient examined, agree with above. He has severe LM and 3 vessel CAD. Echo shows normal LVEF and mild AS. I think he LAD, diagonal, PD and PL are graftable. I am not sure about his LCX marginal. It may be too small where it will be visible. I agree that CABG is the best treatment for him. I discussed the operative procedure with the patient  including alternatives, benefits and risks; including but not limited to bleeding, blood transfusion, infection, stroke, myocardial infarction, graft failure, heart block requiring a permanent pacemaker, organ dysfunction, and death.  Hector Edwards understands and agrees to proceed.  We will schedule surgery for Friday am.

## 2022-08-01 NOTE — Progress Notes (Signed)
LOCATION: right RADIAL  DEFLATED PER PROTOCOL: YES  TIME BAND OFF/DRESSING APPLIED: 1142  SITE UPON ARRIVAL: LEVEL 0  SITE AFTER BAND REMOVAL: LEVEL 0  CIRCULATION SENSATION AND  MOVEMENT: bilateral radial pulse at +2  COMMENTS:

## 2022-08-01 NOTE — H&P (Signed)
History of Present Illness: 76 yo male with history of depression, GERD, obesity, HTN, HLD, sleep apnea and recently diagnosed CAD and mild aortic stenosis who is being admitted post cardiac cath today. I saw him as a new consult for the evaluation of dyspnea on exertion on 06/13/22. He was seen in 2017 by Dr. Irish Lack. Echo July 2017 with LV function, no valve disease. Nuclear stress test in 2017 with no ischemia. Echo September 2020 with normal LV function and no valvular disease. At his visit here in September 2023 he described progressive dyspnea and fatigue but no chest pain. Rare LE edema. He has sleep apnea but does not tolerate CPAP. Echo 07/05/22 with LVEF=60-65%, moderate LVH. Mild aortic stenosis with mean gradient of 12 mmHg. Coronary CTA 07/24/22 with severe disease in the LAD and RCA that appears to be flow limiting by CT FFR. Cardiac cath today with severe distal left main stenosis, severe disease in the ostial LAD and mid LAD, ostial Circumflex and in the proximal/distal RCA, PLA and PDA.   He continues to have fatigue and dyspnea on exertion. He denies chest pain.        Past Medical History:  Diagnosis Date   Depression     GERD (gastroesophageal reflux disease)     Hx of adenomatous colonic polyps 09/04/2015   Hyperlipidemia     Hypertension     LBP (low back pain)     OSA (obstructive sleep apnea) 08/01/2018   Sleep apnea      wears cpap            Past Surgical History:  Procedure Laterality Date   COLONOSCOPY   2004, 2021    hyperplastic polyps x 2   KNEE ARTHROSCOPY Right     PENILE PROSTHESIS IMPLANT N/A 01/25/2020    Procedure: [place];  Surgeon: Lucas Mallow, MD;  Location: [facility];  Service: Urology;  Laterality: N/A;   SHOULDER ARTHROSCOPY W/ ROTATOR CUFF REPAIR Left     TONSILLECTOMY   as child   vocal cord growth removed   age 54    benign   WISDOM TOOTH EXTRACTION                Current  Outpatient Medications  Medication Sig Dispense Refill   benazepril (LOTENSIN) 20 MG tablet TAKE TWO TABLETS BY MOUTH EVERYDAY AT BEDTIME 60 tablet 0   meloxicam (MOBIC) 15 MG tablet Take 15 mg by mouth daily. Take one tablet by mouth once daily       pantoprazole (PROTONIX) 40 MG tablet TAKE ONE TABLET BY MOUTH EVERY EVENING 30 tablet 0   UNABLE TO FIND Med Name: vitamin b 1000 bid        No current facility-administered medications for this visit.           Allergies  Allergen Reactions   Enalapril Maleate        REACTION: cramps   Hctz [Hydrochlorothiazide]        SOB   Tribenzor [Olmesartan-Amlodipine-Hctz] Swelling      Fatigue, CP, edema   Verapamil        REACTION: Fatigue      Social History         Socioeconomic History   Marital status: Divorced      Spouse name: Not on file   Number of children: 1   Years of education: Not on file   Highest education level: Not on file  Occupational History   Occupation: Retired   Occupation: Nurse, mental health  Tobacco Use   Smoking status: Never   Smokeless tobacco: Never  Vaping Use   Vaping Use: Never used  Substance and Sexual Activity   Alcohol use: Yes      Alcohol/week: 4.0 standard drinks of alcohol      Types: 4 Cans of beer per week      Comment: 4 cans/week   Drug use: No   Sexual activity: Not Currently  Other Topics Concern   Not on file  Social History Narrative    Regular exercise- yes, seldom; working physically all the time.    Social Determinants of Health        Financial Resource Strain: Low Risk  (03/06/2022)    Overall Financial Resource Strain (CARDIA)     Difficulty of Paying Living Expenses: Not hard at all  Food Insecurity: No Food Insecurity (03/06/2022)    Hunger Vital Sign     Worried About Running Out of Food in the Last Year: Never true     Ran Out of Food in the Last Year: Never true  Transportation Needs: No Transportation Needs (03/06/2022)    PRAPARE - Visual merchandiser (Medical): No     Lack of Transportation (Non-Medical): No  Physical Activity: Inactive (03/06/2022)    Exercise Vital Sign     Days of Exercise per Week: 0 days     Minutes of Exercise per Session: 0 min  Stress: No Stress Concern Present (03/06/2022)    [place]     Feeling of Stress : Not at all  Social Connections: Moderately Integrated (03/06/2022)    Social Connection and Isolation Panel [NHANES]     Frequency of Communication with Friends and Family: More than three times a week     Frequency of Social Gatherings with Friends and Family: More than three times a week     Attends Religious Services: 1 to 4 times per year     Active Member of [employer] or Organizations: Yes     Attends Archivist Meetings: 1 to 4 times per year     Marital Status: Divorced  Human resources officer Violence: Not At Risk (03/06/2022)    Humiliation, Afraid, Rape, and Kick questionnaire     Fear of Current or Ex-Partner: No     Emotionally Abused: No     Physically Abused: No     Sexually Abused: No           Family History  Problem Relation Age of Onset   Stroke Mother 74   Depression Mother     Hypertension Father     Heart disease Father     Colon polyps Father     Pancreatic cancer Brother     Colon polyps Brother     Throat cancer Brother     Breast cancer Daughter     Hypertension Other     Colon cancer Neg Hx     Esophageal cancer Neg Hx     Stomach cancer Neg Hx     Rectal cancer Neg Hx        Review of Systems:  As stated in the HPI and otherwise negative.    BP (!) 140/82   Pulse 91   Ht '6\' 4"'$  (1.93 m)   Wt (!) 304 lb 9.6 oz (138.2 kg)   SpO2 97%   BMI 37.08  kg/m    Physical Examination: General: Well developed, well nourished, NAD  HEENT: OP clear, mucus membranes moist  SKIN: warm, dry. No rashes. Neuro: No focal deficits  Musculoskeletal: Muscle strength 5/5 all ext  Psychiatric:  Mood and affect normal  Neck: No JVD, no carotid bruits, no thyromegaly, no lymphadenopathy.  Lungs:Clear bilaterally, no wheezes, rhonci, crackles Cardiovascular: Regular rate and rhythm. No murmurs, gallops or rubs. Abdomen:Soft. Bowel sounds present. Non-tender.  Extremities: No lower extremity edema. Pulses are 2 + in the bilateral DP/PT.  Echo 07/05/22:  1. Mild concentric LVH with proximal septal thickening. Thickened aortic  valve with mild AS (mean gradient 12 mmHg).   2. Left ventricular ejection fraction, by estimation, is 60 to 65%. The  left ventricle has normal function. The left ventricle has no regional  wall motion abnormalities. There is moderate left ventricular hypertrophy  of the basal-septal segment. Left  ventricular diastolic parameters are consistent with Grade I diastolic  dysfunction (impaired relaxation).   3. Right ventricular systolic function is normal. The right ventricular  size is normal.   4. The mitral valve is normal in structure. No evidence of mitral valve  regurgitation. No evidence of mitral stenosis.   5. The aortic valve is tricuspid. Aortic valve regurgitation is not  visualized. Mild aortic valve stenosis.   6. Aortic dilatation noted. There is mild dilatation of the aortic root,  measuring 42 mm.   7. The inferior vena cava is normal in size with greater than 50%  respiratory variability, suggesting right atrial pressure of 3 mmHg.    Recent Labs: 06/05/2022: ALT 35; BUN 15; Creatinine, Ser 0.80; Hemoglobin 15.1; Platelets 204.0; Potassium 4.1; Sodium 136; TSH 2.37      Assessment and Plan:    1. CAD with unstable angina: Severe left main and three vessel CAD by cath today. Will admit to telemetry and ask CT surgery to see him today to review options for CABG. Will start high intensity statin, beta blocker and ASA.   Lauree Chandler, MD, [facility] 08/01/2022 9:23 AM

## 2022-08-01 NOTE — Progress Notes (Signed)
Pt admitted to [place] [place], VS as per flow. Pt oriented to 6E processes. All questions and concerns addressed. Awaiting on CTSX MD. Call bell placed within reach, will continue to monitor and maintain safety.

## 2022-08-01 NOTE — Interval H&P Note (Signed)
History and Physical Interval Note:  08/01/2022 8:02 AM  Hector Edwards  has presented today for surgery, with the diagnosis of cad with angina.  The various methods of treatment have been discussed with the patient and family. After consideration of risks, benefits and other options for treatment, the patient has consented to  Procedure(s): LEFT HEART CATH AND CORONARY ANGIOGRAPHY (N/A) as a surgical intervention.  The patient's history has been reviewed, patient examined, no change in status, stable for surgery.  I have reviewed the patient's chart and labs.  Questions were answered to the patient's satisfaction.    Cath Lab Visit (complete for each Cath Lab visit)  Clinical Evaluation Leading to the Procedure:   ACS: No.  Non-ACS:    Anginal Classification: CCS [name]  Anti-ischemic medical therapy: No Therapy  Non-Invasive Test Results: High-risk stress test findings: cardiac mortality >3%/year (Coronary CTA with severe two vessel CAD)  Prior CABG: No previous CABG        Lauree Chandler

## 2022-08-02 ENCOUNTER — Encounter (HOSPITAL_COMMUNITY): Payer: Self-pay | Admitting: Cardiovascular Disease

## 2022-08-02 ENCOUNTER — Inpatient Hospital Stay (HOSPITAL_COMMUNITY): Payer: Medicare HMO

## 2022-08-02 DIAGNOSIS — I10 Essential (primary) hypertension: Secondary | ICD-10-CM

## 2022-08-02 DIAGNOSIS — I25110 Atherosclerotic heart disease of native coronary artery with unstable angina pectoris: Principal | ICD-10-CM

## 2022-08-02 DIAGNOSIS — I350 Nonrheumatic aortic (valve) stenosis: Secondary | ICD-10-CM | POA: Diagnosis not present

## 2022-08-02 DIAGNOSIS — Z01810 Encounter for preprocedural cardiovascular examination: Secondary | ICD-10-CM

## 2022-08-02 DIAGNOSIS — E7800 Pure hypercholesterolemia, unspecified: Secondary | ICD-10-CM | POA: Diagnosis not present

## 2022-08-02 DIAGNOSIS — I2510 Atherosclerotic heart disease of native coronary artery without angina pectoris: Secondary | ICD-10-CM | POA: Diagnosis not present

## 2022-08-02 LAB — URINALYSIS, ROUTINE W REFLEX MICROSCOPIC
Bilirubin Urine: NEGATIVE
Glucose, UA: NEGATIVE mg/dL
Hgb urine dipstick: NEGATIVE
Ketones, ur: NEGATIVE mg/dL
Leukocytes,Ua: NEGATIVE
Nitrite: NEGATIVE
Protein, ur: NEGATIVE mg/dL
Specific Gravity, Urine: 1.016 (ref 1.005–1.030)
pH: 7 (ref 5.0–8.0)

## 2022-08-02 LAB — BLOOD GAS, ARTERIAL
Acid-Base Excess: 2.4 mmol/L — ABNORMAL HIGH (ref 0.0–2.0)
Bicarbonate: 27.2 mmol/L (ref 20.0–28.0)
O2 Saturation: 97.2 %
Patient temperature: 36.5
pCO2 arterial: 41 mmHg (ref 32–48)
pH, Arterial: 7.43 (ref 7.35–7.45)
pO2, Arterial: 83 mmHg (ref 83–108)

## 2022-08-02 LAB — BASIC METABOLIC PANEL
Anion gap: 9 (ref 5–15)
BUN: 8 mg/dL (ref 8–23)
CO2: 24 mmol/L (ref 22–32)
Calcium: 9.2 mg/dL (ref 8.9–10.3)
Chloride: 104 mmol/L (ref 98–111)
Creatinine, Ser: 0.81 mg/dL (ref 0.61–1.24)
GFR, Estimated: >60 mL/min (ref >60)
Glucose, Bld: 119 mg/dL — ABNORMAL HIGH (ref 70–99)
Potassium: 4 mmol/L (ref 3.5–5.1)
Sodium: 137 mmol/L (ref 135–145)

## 2022-08-02 LAB — TYPE AND SCREEN
ABO/RH(D): A POS
Antibody Screen: NEGATIVE

## 2022-08-02 LAB — CBC
HCT: 41.8 % (ref 39.0–52.0)
Hemoglobin: 14.8 g/dL (ref 13.0–17.0)
MCH: 33.9 pg (ref 26.0–34.0)
MCHC: 35.4 g/dL (ref 30.0–36.0)
MCV: 95.9 fL (ref 80.0–100.0)
Platelets: 147 10*3/uL — ABNORMAL LOW (ref 150–400)
RBC: 4.36 MIL/uL (ref 4.22–5.81)
RDW: 13.7 % (ref 11.5–15.5)
WBC: 5.1 10*3/uL (ref 4.0–10.5)
nRBC: 0 % (ref 0.0–0.2)

## 2022-08-02 LAB — PROTIME-INR
INR: 1 (ref 0.8–1.2)
Prothrombin Time: 13.5 seconds (ref 11.4–15.2)

## 2022-08-02 LAB — SURGICAL PCR SCREEN
MRSA, PCR: NEGATIVE
Staphylococcus aureus: NEGATIVE

## 2022-08-02 LAB — APTT: aPTT: 37 seconds — ABNORMAL HIGH (ref 24–36)

## 2022-08-02 LAB — ABO/RH: ABO/RH(D): A POS

## 2022-08-02 LAB — HEMOGLOBIN A1C
Hgb A1c MFr Bld: 5.4 % (ref 4.8–5.6)
Mean Plasma Glucose: 108.28 mg/dL

## 2022-08-02 MED ORDER — TRANEXAMIC ACID (OHS) PUMP PRIME SOLUTION
2.0000 mg/kg | INTRAVENOUS | Status: DC
  Filled 2022-08-02: qty 2.7

## 2022-08-02 MED ORDER — POTASSIUM CHLORIDE 2 MEQ/ML IV SOLN
80.0000 meq | INTRAVENOUS | Status: DC
  Filled 2022-08-02: qty 40

## 2022-08-02 MED ORDER — BISACODYL 5 MG PO TBEC: 5.0000 mg | DELAYED_RELEASE_TABLET | Freq: Once | ORAL | Status: DC

## 2022-08-02 MED ORDER — METOPROLOL TARTRATE 12.5 MG HALF TABLET
12.5000 mg | ORAL_TABLET | Freq: Once | ORAL | Status: AC
  Administered 2022-08-03: 12.5 mg via ORAL
  Filled 2022-08-02: qty 1

## 2022-08-02 MED ORDER — MILRINONE LACTATE IN DEXTROSE 20-5 MG/100ML-% IV SOLN
0.3000 ug/kg/min | INTRAVENOUS | Status: DC
  Filled 2022-08-02: qty 100

## 2022-08-02 MED ORDER — NOREPINEPHRINE 4 MG/250ML-% IV SOLN
0.0000 ug/min | INTRAVENOUS | Status: DC
  Filled 2022-08-02: qty 250

## 2022-08-02 MED ORDER — TRANEXAMIC ACID (OHS) BOLUS VIA INFUSION
15.0000 mg/kg | INTRAVENOUS | Status: DC
  Filled 2022-08-02: qty 2025

## 2022-08-02 MED ORDER — PLASMA-LYTE A IV SOLN
INTRAVENOUS | Status: DC
  Filled 2022-08-02 (×2): qty 2.5

## 2022-08-02 MED ORDER — PHENYLEPHRINE HCL-NACL 20-0.9 MG/250ML-% IV SOLN
30.0000 ug/min | INTRAVENOUS | Status: AC
  Administered 2022-08-03: 30 ug/min via INTRAVENOUS
  Filled 2022-08-02: qty 250

## 2022-08-02 MED ORDER — DEXMEDETOMIDINE HCL IN NACL 400 MCG/100ML IV SOLN
0.1000 ug/kg/h | INTRAVENOUS | Status: AC
  Administered 2022-08-03: .5 ug/kg/h via INTRAVENOUS
  Filled 2022-08-02: qty 100

## 2022-08-02 MED ORDER — CEFAZOLIN IN SODIUM CHLORIDE 3-0.9 GM/100ML-% IV SOLN
3.0000 g | INTRAVENOUS | Status: DC
  Filled 2022-08-02: qty 100

## 2022-08-02 MED ORDER — INSULIN REGULAR(HUMAN) IN NACL 100-0.9 UT/100ML-% IV SOLN
INTRAVENOUS | Status: AC
  Administered 2022-08-03: 1.1 [IU]/h via INTRAVENOUS
  Filled 2022-08-02: qty 100

## 2022-08-02 MED ORDER — MAGNESIUM SULFATE 50 % IJ SOLN
40.0000 meq | INTRAMUSCULAR | Status: DC
  Filled 2022-08-02: qty 9.85

## 2022-08-02 MED ORDER — CHLORHEXIDINE GLUCONATE CLOTH 2 % EX PADS
6.0000 | MEDICATED_PAD | Freq: Once | CUTANEOUS | Status: AC
  Administered 2022-08-02: 6 via TOPICAL

## 2022-08-02 MED ORDER — EPINEPHRINE HCL 5 MG/250ML IV SOLN IN NS
0.0000 ug/min | INTRAVENOUS | Status: DC
  Filled 2022-08-02: qty 250

## 2022-08-02 MED ORDER — DIAZEPAM 5 MG PO TABS
5.0000 mg | ORAL_TABLET | Freq: Once | ORAL | Status: AC
  Administered 2022-08-03: 5 mg via ORAL
  Filled 2022-08-02: qty 1

## 2022-08-02 MED ORDER — NAPHAZOLINE-GLYCERIN 0.012-0.25 % OP SOLN
1.0000 [drp] | Freq: Four times a day (QID) | OPHTHALMIC | Status: DC | PRN
  Administered 2022-08-02: 2 [drp] via OPHTHALMIC
  Filled 2022-08-02: qty 15

## 2022-08-02 MED ORDER — VANCOMYCIN HCL 1500 MG/300ML IV SOLN
1500.0000 mg | INTRAVENOUS | Status: AC
  Administered 2022-08-03: 1500 mg via INTRAVENOUS
  Filled 2022-08-02: qty 300

## 2022-08-02 MED ORDER — TRANEXAMIC ACID 1000 MG/10ML IV SOLN
1.5000 mg/kg/h | INTRAVENOUS | Status: AC
  Administered 2022-08-03 (×2): 1.5 mg/kg/h via INTRAVENOUS
  Filled 2022-08-02 (×3): qty 25

## 2022-08-02 MED ORDER — HEPARIN 30,000 UNITS/1000 ML (OHS) CELLSAVER SOLUTION
Status: DC
  Filled 2022-08-02: qty 1000

## 2022-08-02 MED ORDER — CHLORHEXIDINE GLUCONATE CLOTH 2 % EX PADS
6.0000 | MEDICATED_PAD | Freq: Once | CUTANEOUS | Status: AC
  Administered 2022-08-03: 6 via TOPICAL

## 2022-08-02 MED ORDER — TEMAZEPAM 7.5 MG PO CAPS: 15.0000 mg | ORAL_CAPSULE | Freq: Once | ORAL | Status: DC | PRN

## 2022-08-02 MED ORDER — CHLORHEXIDINE GLUCONATE 0.12 % MT SOLN
15.0000 mL | Freq: Once | OROMUCOSAL | Status: AC
  Administered 2022-08-03: 15 mL via OROMUCOSAL
  Filled 2022-08-02: qty 15

## 2022-08-02 MED ORDER — CEFAZOLIN IN SODIUM CHLORIDE 3-0.9 GM/100ML-% IV SOLN
3.0000 g | INTRAVENOUS | Status: AC
  Administered 2022-08-03 (×2): 3 g via INTRAVENOUS
  Filled 2022-08-02: qty 100

## 2022-08-02 NOTE — Progress Notes (Signed)
CARDIAC REHAB PHASE I       OHS pre-op education including move in the tub, sternal precautions, OHS handout, OHS booklet, IS use, early ambulation, home needs at discharge and pain management reviewed. All questions and concerns addressed. Able to reach [phone] with IS today. Will continue to follow.    [phone] Vanessa Barbara, RN BSN 08/02/2022 11:47 AM

## 2022-08-02 NOTE — Progress Notes (Signed)
Rounding Note    Patient Name: Hector Edwards Date of Encounter: 08/02/2022  [place] Cardiologist: Lauree Chandler, MD   Subjective   No problems over night, no chest pain or SOB  Inpatient Medications    Scheduled Meds:  aspirin  81 mg Oral Daily   atorvastatin  80 mg Oral Daily   benazepril  40 mg Oral Daily   enoxaparin (LOVENOX) injection  40 mg Subcutaneous Q24H   metoprolol tartrate  25 mg Oral BID   pantoprazole  40 mg Oral QPM   sodium chloride flush  3 mL Intravenous Q12H   Continuous Infusions:  sodium chloride     PRN Meds: sodium chloride, acetaminophen, melatonin, morphine injection, ondansetron (ZOFRAN) IV, oxyCODONE, sodium chloride flush   Vital Signs    Vitals:   08/01/22 1303 08/01/22 2108 08/02/22 0010 08/02/22 0426  BP: (!) 159/81 (!) 160/77 139/89 (!) 157/95  Pulse: 75 76 83 66  Resp: '15 16 15 14  '$ Temp: 97.6 F (36.4 C) 98.2 F (36.8 C) 97.8 F (36.6 C) 97.7 F (36.5 C)  TempSrc: Oral Oral Oral Oral  SpO2: 95% 97% 98% 97%  Weight: 135 kg     Height: '6\' 4"'$  (1.93 m)       Intake/Output Summary (Last 24 hours) at 08/02/2022 0754 Last data filed at 08/02/2022 0431 Gross per 24 hour  Intake 240 ml  Output 1925 ml  Net -1685 ml      08/01/2022    1:03 PM 08/01/2022    6:48 AM 07/27/2022    8:31 AM  Last 3 Weights  Weight (lbs) 297 lb 9.6 oz 300 lb 304 lb 9.6 oz  Weight (kg) 134.99 kg 136.079 kg 138.166 kg      Telemetry    SR - Personally Reviewed  ECG    SR 1st degree AV block - LBBB, LAFB, LVH - Personally Reviewed  Physical Exam   GEN: No acute distress.   Neck: No JVD Cardiac: RRR, no murmurs, rubs, or gallops. Rt wrist without hematoma Respiratory: Clear to auscultation bilaterally. GI: Soft, nontender, non-distended  MS: No edema; No deformity. Neuro:  Nonfocal  Psych: Normal affect   Labs    High Sensitivity Troponin:  No results for input(s): "TROPONINIHS" in the last 720 hours.    Chemistry Recent Labs  Lab 07/27/22 0904 08/01/22 1441 08/02/22 0255  NA 139  --  137  K 3.9  --  4.0  CL 101  --  104  CO2 25  --  24  GLUCOSE 134*  --  119*  BUN 12  --  8  CREATININE 0.94 0.74 0.81  CALCIUM 9.9  --  9.2  GFRNONAA  --  >60 >60  ANIONGAP  --   --  9    Lipids No results for input(s): "CHOL", "TRIG", "HDL", "LABVLDL", "LDLCALC", "CHOLHDL" in the last 168 hours.  Hematology Recent Labs  Lab 07/27/22 0904 08/01/22 1441 08/02/22 0255  WBC 5.9 4.4 5.1  RBC 4.59 4.18* 4.36  HGB 15.1 13.7 14.8  HCT 43.5 40.7 41.8  MCV 95 97.4 95.9  MCH 32.9 32.8 33.9  MCHC 34.7 33.7 35.4  RDW 13.2 13.6 13.7  PLT 165 140* 147*   Thyroid No results for input(s): "TSH", "FREET4" in the last 168 hours.  BNPNo results for input(s): "BNP", "PROBNP" in the last 168 hours.  DDimer No results for input(s): "DDIMER" in the last 168 hours.   Radiology    CARDIAC  CATHETERIZATION  Result Date: 08/01/2022   Mid RCA to Dist RCA lesion is 90% stenosed.   Ost RCA to Prox RCA lesion is 80% stenosed.   RPAV lesion is 99% stenosed.   RPDA lesion is 80% stenosed.   Mid LM to Dist LM lesion is 80% stenosed.   Ost Cx to Prox Cx lesion is 70% stenosed.   Dist LM to Ost LAD lesion is 80% stenosed.   Prox LAD to Mid LAD lesion is 60% stenosed.   Mid LAD lesion is 80% stenosed. Severe distal left main/three vessel CAD Severe, heavily calcified eccentric distal left main stenosis Severe ostial LAD stenosis. Severe mid LAD stenosis Severe ostial Circumflex stenosis. Large dominant RCA with severe ostial stenosis. Severe mid to distal vessel stenosis. Severe stenosis in the proximal posterolateral artery. Severe ostial PDA stenosis. LVEDP 18 mmHg Recommendations: Severe left main and three vessel. Will admit to telemetry and consult CT surgery to review candidacy for bypass surgery. Will add statin, beta blocker and ASA.    Cardiac Studies   LEFT HEART CATH AND CORONARY ANGIOGRAPHY      Mid RCA to  Dist RCA lesion is 90% stenosed.   Ost RCA to Prox RCA lesion is 80% stenosed.   RPAV lesion is 99% stenosed.   RPDA lesion is 80% stenosed.   Mid LM to Dist LM lesion is 80% stenosed.   Ost Cx to Prox Cx lesion is 70% stenosed.   Dist LM to Ost LAD lesion is 80% stenosed.   Prox LAD to Mid LAD lesion is 60% stenosed.   Mid LAD lesion is 80% stenosed.   Severe distal left main/three vessel CAD Severe, heavily calcified eccentric distal left main stenosis Severe ostial LAD stenosis. Severe mid LAD stenosis Severe ostial Circumflex stenosis.  Large dominant RCA with severe ostial stenosis. Severe mid to distal vessel stenosis. Severe stenosis in the proximal posterolateral artery. Severe ostial PDA stenosis.  LVEDP 18 mmHg   Dominance: Right    ECHOCARDIOGRAM REPORT         Patient Name:   Hector Edwards Date of Exam: 07/05/2022  Medical Rec #:  [id number]        Height:       76.0 in  Accession #:    [id number]       Weight:       296.0 lb  Date of Birth:  16-Jan-1946       BSA:          2.617 m  Patient Age:    75 years         BP:           155/110 mmHg  Patient Gender: M                HR:           90 bpm.  Exam Location:  [place]   Procedure: 2D Echo, Cardiac Doppler, Color Doppler and Strain Analysis   Indications:    R06.00 Dyspnea     History:        Patient has prior history of Echocardiogram examinations,  most                  recent 06/25/2019. Signs/Symptoms:Fatigue; Risk                  Factors:Hypertension, Sleep Apnea and HLD.     Sonographer:    Marygrace Drought RCS  Referring Phys: Carroll  MCALHANY   IMPRESSIONS     1. Mild concentric LVH with proximal septal thickening. Thickened aortic  valve with mild AS (mean gradient 12 mmHg).   2. Left ventricular ejection fraction, by estimation, is 60 to 65%. The  left ventricle has normal function. The left ventricle has no regional  wall motion abnormalities. There is moderate left  ventricular hypertrophy  of the basal-septal segment. Left  ventricular diastolic parameters are consistent with Grade I diastolic  dysfunction (impaired relaxation).   3. Right ventricular systolic function is normal. The right ventricular  size is normal.   4. The mitral valve is normal in structure. No evidence of mitral valve  regurgitation. No evidence of mitral stenosis.   5. The aortic valve is tricuspid. Aortic valve regurgitation is not  visualized. Mild aortic valve stenosis.   6. Aortic dilatation noted. There is mild dilatation of the aortic root,  measuring 42 mm.   7. The inferior vena cava is normal in size with greater than 50%  respiratory variability, suggesting right atrial pressure of 3 mmHg.   FINDINGS   Left Ventricle: Left ventricular ejection fraction, by estimation, is 60  to 65%. The left ventricle has normal function. The left ventricle has no  regional wall motion abnormalities. The left ventricular internal cavity  size was normal in size. There is   moderate left ventricular hypertrophy of the basal-septal segment. Left  ventricular diastolic parameters are consistent with Grade I diastolic  dysfunction (impaired relaxation).   Right Ventricle: The right ventricular size is normal. Right ventricular  systolic function is normal.   Left Atrium: Left atrial size was normal in size.   Right Atrium: Right atrial size was normal in size.   Pericardium: There is no evidence of pericardial effusion.   Mitral Valve: The mitral valve is normal in structure. No evidence of  mitral valve regurgitation. No evidence of mitral valve stenosis.   Tricuspid Valve: The tricuspid valve is normal in structure. Tricuspid  valve regurgitation is not demonstrated. No evidence of tricuspid  stenosis.   Aortic Valve: The aortic valve is tricuspid. Aortic valve regurgitation is  not visualized. Mild aortic stenosis is present. Aortic valve mean  gradient measures 12.0  mmHg. Aortic valve peak gradient measures 18.8  mmHg. Aortic valve area, by VTI measures  2.56 cm.   Pulmonic Valve: The pulmonic valve was normal in structure. Pulmonic valve  regurgitation is trivial. No evidence of pulmonic stenosis.   Aorta: Aortic dilatation noted. There is mild dilatation of the aortic  root, measuring 42 mm.   Venous: The inferior vena cava is normal in size with greater than 50%  respiratory variability, suggesting right atrial pressure of 3 mmHg.   IAS/Shunts: No atrial level shunt detected by color flow Doppler.   Additional Comments: Mild concentric LVH with proximal septal thickening.  Thickened aortic valve with mild AS (mean gradient 12 mmHg).      LEFT VENTRICLE  PLAX 2D  LVIDd:         3.80 cm   Diastology  LV PW:         1.30 cm   LV e' medial:    5.33 cm/s  LV IVS:        1.60 cm   LV E/e' medial:  11.1  LVOT diam:     2.30 cm   LV e' lateral:   7.18 cm/s  LV SV:         101  LV E/e' lateral: 8.2  LV SV Index:   38  LVOT Area:     4.15 cm      RIGHT VENTRICLE  RV Basal diam:  3.00 cm  RV S prime:     14.30 cm/s   LEFT ATRIUM             Index        RIGHT ATRIUM           Index  LA Vol (A2C):   54.2 ml 20.71 ml/m  RA Area:     11.30 cm  LA Vol (A4C):   33.3 ml 12.73 ml/m  RA Volume:   20.10 ml  7.68 ml/m  LA Biplane Vol: 43.5 ml 16.62 ml/m   AORTIC VALVE  AV Area (Vmax):    2.60 cm  AV Area (Vmean):   2.39 cm  AV Area (VTI):     2.56 cm  AV Vmax:           217.00 cm/s  AV Vmean:          165.000 cm/s  AV VTI:            0.393 m  AV Peak Grad:      18.8 mmHg  AV Mean Grad:      12.0 mmHg  LVOT Vmax:         136.00 cm/s  LVOT Vmean:        94.800 cm/s  LVOT VTI:          0.242 m  LVOT/AV VTI ratio: 0.62     AORTA  Ao Root diam: 4.20 cm  Ao Asc diam:  3.50 cm   MITRAL VALVE  MV Area (PHT):              SHUNTS  MV Decel Time:              Systemic VTI:  0.24 m  MV E velocity: 59.20 cm/s   Systemic Diam: 2.30 cm   MV A velocity: 113.00 cm/s  MV E/A ratio:  0.52     Patient Profile     76 y.o. male history of depression, GERD, obesity, HTN, HLD and sleep apnea now presented for cath due to DOE.  In 2017 stable echo and stress test and normal echo in 2020.  He has sleep apnea but does not tolerate CPAP. Echo 07/05/22 with LVEF=60-65%, moderate LVH. Mild aortic stenosis with mean gradient of 12 mmHg. Coronary CTA 07/24/22 with severe disease in the LAD and RCA that appears to be flow limiting by CT FFR.  Presented for cardiac cath with severe CAD.    Assessment & Plan    CAD with DOE and abnormal coronary CTA -presented for cardiac cath. - severe  LM and 3 Vessel disease.   - statin , BB and ASA added.  - Dr. Cyndia Bent has seen and plan for CABG on Friday.  - pre-cabg dopplers ordered  HLD - statin added and LPa in process - no recent lipid panel   Sleep apnea -intolerant to CPAP  HTN -lotensin and lopressor - BP 157/95 prior to meds  For questions or updates, please contact [facility] Please consult [email or web address] for contact info under        Signed, Cecilie Kicks, NP  08/02/2022, 7:54 AM

## 2022-08-02 NOTE — Progress Notes (Signed)
1 Day Post-Op Procedure(s) (LRB): LEFT HEART CATH AND CORONARY ANGIOGRAPHY (N/A) Subjective: No complaints. No shortness of breath.  Objective: Vital signs in last 24 hours: Temp:  [97.7 F (36.5 C)-98.2 F (36.8 C)] 98.2 F (36.8 C) (10/26 1257) Pulse Rate:  [66-83] 78 (10/26 1257) Cardiac Rhythm: Normal sinus rhythm (10/26 0808) Resp:  [14-18] 18 (10/26 1257) BP: (139-187)/(77-97) 164/87 (10/26 1257) SpO2:  [92 %-98 %] 97 % (10/26 1257)  Hemodynamic parameters for last 24 hours:    Intake/Output from previous day: 10/25 0701 - 10/26 0700 In: 240 [P.O.:240] Out: 1925 [Urine:1925] Intake/Output this shift: Total I/O In: -  Out: 565 [Urine:565]  General appearance: alert and cooperative Neurologic: intact Heart: regular rate and rhythm Lungs: clear to auscultation bilaterally  Lab Results: Recent Labs    08/01/22 1441 08/02/22 0255  WBC 4.4 5.1  HGB 13.7 14.8  HCT 40.7 41.8  PLT 140* 147*   BMET:  Recent Labs    08/01/22 1441 08/02/22 0255  NA  --  137  K  --  4.0  CL  --  104  CO2  --  24  GLUCOSE  --  119*  BUN  --  8  CREATININE 0.74 0.81  CALCIUM  --  9.2    PT/INR:  Recent Labs    08/02/22 0621  LABPROT 13.5  INR 1.0   ABG    Component Value Date/Time   PHART 7.43 08/02/2022 0625   HCO3 27.2 08/02/2022 0625   TCO2 26 01/25/2020 0606   O2SAT 97.2 08/02/2022 0625   CBG (last 3)  No results for input(s): "GLUCAP" in the last 72 hours.  Assessment/Plan:  Severe left main and 3 vessel CAD. Plan CABG in am. Pt has no further questions. Preop orders written.   LOS: 1 day    Gaye Pollack 08/02/2022

## 2022-08-02 NOTE — Progress Notes (Signed)
Pre-CABG study completed.  ° °Please see CV Proc for preliminary results.  ° °Demetrius Barrell, RDMS, RVT ° °

## 2022-08-02 NOTE — H&P (View-Only) (Signed)
1 Day Post-Op Procedure(s) (LRB): LEFT HEART CATH AND CORONARY ANGIOGRAPHY (N/A) Subjective: No complaints. No shortness of breath.  Objective: Vital signs in last 24 hours: Temp:  [97.7 F (36.5 C)-98.2 F (36.8 C)] 98.2 F (36.8 C) (10/26 1257) Pulse Rate:  [66-83] 78 (10/26 1257) Cardiac Rhythm: Normal sinus rhythm (10/26 0808) Resp:  [14-18] 18 (10/26 1257) BP: (139-187)/(77-97) 164/87 (10/26 1257) SpO2:  [92 %-98 %] 97 % (10/26 1257)  Hemodynamic parameters for last 24 hours:    Intake/Output from previous day: 10/25 0701 - 10/26 0700 In: 240 [P.O.:240] Out: 1925 [Urine:1925] Intake/Output this shift: Total I/O In: -  Out: 565 [Urine:565]  General appearance: alert and cooperative Neurologic: intact Heart: regular rate and rhythm Lungs: clear to auscultation bilaterally  Lab Results: Recent Labs    08/01/22 1441 08/02/22 0255  WBC 4.4 5.1  HGB 13.7 14.8  HCT 40.7 41.8  PLT 140* 147*   BMET:  Recent Labs    08/01/22 1441 08/02/22 0255  NA  --  137  K  --  4.0  CL  --  104  CO2  --  24  GLUCOSE  --  119*  BUN  --  8  CREATININE 0.74 0.81  CALCIUM  --  9.2    PT/INR:  Recent Labs    08/02/22 0621  LABPROT 13.5  INR 1.0   ABG    Component Value Date/Time   PHART 7.43 08/02/2022 0625   HCO3 27.2 08/02/2022 0625   TCO2 26 01/25/2020 0606   O2SAT 97.2 08/02/2022 0625   CBG (last 3)  No results for input(s): "GLUCAP" in the last 72 hours.  Assessment/Plan:  Severe left main and 3 vessel CAD. Plan CABG in am. Pt has no further questions. Preop orders written.   LOS: 1 day    Gaye Pollack 08/02/2022

## 2022-08-03 ENCOUNTER — Inpatient Hospital Stay (HOSPITAL_COMMUNITY): Payer: Medicare HMO | Admitting: Certified Registered Nurse Anesthetist

## 2022-08-03 ENCOUNTER — Inpatient Hospital Stay (HOSPITAL_COMMUNITY): Payer: Medicare HMO

## 2022-08-03 ENCOUNTER — Other Ambulatory Visit: Payer: Self-pay

## 2022-08-03 ENCOUNTER — Inpatient Hospital Stay (HOSPITAL_COMMUNITY)
Admission: AD | Disposition: A | Discharge status: Home Health | Payer: Self-pay | Source: Ambulatory Visit | Attending: Surgery

## 2022-08-03 DIAGNOSIS — M1990 Unspecified osteoarthritis, unspecified site: Secondary | ICD-10-CM

## 2022-08-03 DIAGNOSIS — I10 Essential (primary) hypertension: Secondary | ICD-10-CM | POA: Diagnosis not present

## 2022-08-03 DIAGNOSIS — G4730 Sleep apnea, unspecified: Secondary | ICD-10-CM | POA: Diagnosis not present

## 2022-08-03 DIAGNOSIS — Z951 Presence of aortocoronary bypass graft: Principal | ICD-10-CM

## 2022-08-03 DIAGNOSIS — I25119 Atherosclerotic heart disease of native coronary artery with unspecified angina pectoris: Secondary | ICD-10-CM | POA: Diagnosis not present

## 2022-08-03 DIAGNOSIS — I2510 Atherosclerotic heart disease of native coronary artery without angina pectoris: Secondary | ICD-10-CM | POA: Diagnosis not present

## 2022-08-03 HISTORY — PX: CORONARY ARTERY BYPASS GRAFT: SHX141

## 2022-08-03 HISTORY — PX: TEE WITHOUT CARDIOVERSION: SHX5443

## 2022-08-03 LAB — POCT I-STAT 7, (LYTES, BLD GAS, ICA,H+H)
Acid-Base Excess: 0 mmol/L (ref 0.0–2.0)
Acid-Base Excess: 2 mmol/L (ref 0.0–2.0)
Acid-Base Excess: 1 mmol/L (ref 0.0–2.0)
Acid-base deficit: 3 mmol/L — ABNORMAL HIGH (ref 0.0–2.0)
Acid-base deficit: 3 mmol/L — ABNORMAL HIGH (ref 0.0–2.0)
Acid-base deficit: 5 mmol/L — ABNORMAL HIGH (ref 0.0–2.0)
Bicarbonate: 26.5 mmol/L (ref 20.0–28.0)
Bicarbonate: 27.4 mmol/L (ref 20.0–28.0)
Bicarbonate: 25.5 mmol/L (ref 20.0–28.0)
Bicarbonate: 22 mmol/L (ref 20.0–28.0)
Bicarbonate: 22.2 mmol/L (ref 20.0–28.0)
Bicarbonate: 19.6 mmol/L — ABNORMAL LOW (ref 20.0–28.0)
Calcium, Ion: 1.26 mmol/L (ref 1.15–1.40)
Calcium, Ion: 1.05 mmol/L — ABNORMAL LOW (ref 1.15–1.40)
Calcium, Ion: 1.09 mmol/L — ABNORMAL LOW (ref 1.15–1.40)
Calcium, Ion: 1.03 mmol/L — ABNORMAL LOW (ref 1.15–1.40)
Calcium, Ion: 1.11 mmol/L — ABNORMAL LOW (ref 1.15–1.40)
Calcium, Ion: 1.09 mmol/L — ABNORMAL LOW (ref 1.15–1.40)
HCT: 40 % (ref 39.0–52.0)
HCT: 33 % — ABNORMAL LOW (ref 39.0–52.0)
HCT: 34 % — ABNORMAL LOW (ref 39.0–52.0)
HCT: 34 % — ABNORMAL LOW (ref 39.0–52.0)
HCT: 38 % — ABNORMAL LOW (ref 39.0–52.0)
HCT: 33 % — ABNORMAL LOW (ref 39.0–52.0)
Hemoglobin: 13.6 g/dL (ref 13.0–17.0)
Hemoglobin: 11.2 g/dL — ABNORMAL LOW (ref 13.0–17.0)
Hemoglobin: 11.6 g/dL — ABNORMAL LOW (ref 13.0–17.0)
Hemoglobin: 11.6 g/dL — ABNORMAL LOW (ref 13.0–17.0)
Hemoglobin: 12.9 g/dL — ABNORMAL LOW (ref 13.0–17.0)
Hemoglobin: 11.2 g/dL — ABNORMAL LOW (ref 13.0–17.0)
O2 Saturation: 100 %
O2 Saturation: 100 %
O2 Saturation: 100 %
O2 Saturation: 100 %
O2 Saturation: 98 %
O2 Saturation: 97 %
Patient temperature: 37.2
Patient temperature: 38
Potassium: 4 mmol/L (ref 3.5–5.1)
Potassium: 4.5 mmol/L (ref 3.5–5.1)
Potassium: 5.2 mmol/L — ABNORMAL HIGH (ref 3.5–5.1)
Potassium: 4.4 mmol/L (ref 3.5–5.1)
Potassium: 4.2 mmol/L (ref 3.5–5.1)
Potassium: 4.4 mmol/L (ref 3.5–5.1)
Sodium: 137 mmol/L (ref 135–145)
Sodium: 137 mmol/L (ref 135–145)
Sodium: 134 mmol/L — ABNORMAL LOW (ref 135–145)
Sodium: 137 mmol/L (ref 135–145)
Sodium: 137 mmol/L (ref 135–145)
Sodium: 137 mmol/L (ref 135–145)
TCO2: 28 mmol/L (ref 22–32)
TCO2: 29 mmol/L (ref 22–32)
TCO2: 27 mmol/L (ref 22–32)
TCO2: 23 mmol/L (ref 22–32)
TCO2: 23 mmol/L (ref 22–32)
TCO2: 21 mmol/L — ABNORMAL LOW (ref 22–32)
pCO2 arterial: 49.6 mmHg — ABNORMAL HIGH (ref 32–48)
pCO2 arterial: 46.9 mmHg (ref 32–48)
pCO2 arterial: 39.2 mmHg (ref 32–48)
pCO2 arterial: 38.3 mmHg (ref 32–48)
pCO2 arterial: 40.8 mmHg (ref 32–48)
pCO2 arterial: 36.6 mmHg (ref 32–48)
pH, Arterial: 7.337 — ABNORMAL LOW (ref 7.35–7.45)
pH, Arterial: 7.374 (ref 7.35–7.45)
pH, Arterial: 7.422 (ref 7.35–7.45)
pH, Arterial: 7.368 (ref 7.35–7.45)
pH, Arterial: 7.345 — ABNORMAL LOW (ref 7.35–7.45)
pH, Arterial: 7.342 — ABNORMAL LOW (ref 7.35–7.45)
pO2, Arterial: 363 mmHg — ABNORMAL HIGH (ref 83–108)
pO2, Arterial: 385 mmHg — ABNORMAL HIGH (ref 83–108)
pO2, Arterial: 327 mmHg — ABNORMAL HIGH (ref 83–108)
pO2, Arterial: 203 mmHg — ABNORMAL HIGH (ref 83–108)
pO2, Arterial: 118 mmHg — ABNORMAL HIGH (ref 83–108)
pO2, Arterial: 96 mmHg (ref 83–108)

## 2022-08-03 LAB — POCT I-STAT, CHEM 8
BUN: 13 mg/dL (ref 8–23)
BUN: 13 mg/dL (ref 8–23)
BUN: 13 mg/dL (ref 8–23)
BUN: 13 mg/dL (ref 8–23)
BUN: 12 mg/dL (ref 8–23)
Calcium, Ion: 1.23 mmol/L (ref 1.15–1.40)
Calcium, Ion: 1.21 mmol/L (ref 1.15–1.40)
Calcium, Ion: 1.08 mmol/L — ABNORMAL LOW (ref 1.15–1.40)
Calcium, Ion: 1.11 mmol/L — ABNORMAL LOW (ref 1.15–1.40)
Calcium, Ion: 1.02 mmol/L — ABNORMAL LOW (ref 1.15–1.40)
Chloride: 100 mmol/L (ref 98–111)
Chloride: 100 mmol/L (ref 98–111)
Chloride: 100 mmol/L (ref 98–111)
Chloride: 98 mmol/L (ref 98–111)
Chloride: 101 mmol/L (ref 98–111)
Creatinine, Ser: 0.8 mg/dL (ref 0.61–1.24)
Creatinine, Ser: 0.7 mg/dL (ref 0.61–1.24)
Creatinine, Ser: 0.8 mg/dL (ref 0.61–1.24)
Creatinine, Ser: 0.8 mg/dL (ref 0.61–1.24)
Creatinine, Ser: 0.7 mg/dL (ref 0.61–1.24)
Glucose, Bld: 118 mg/dL — ABNORMAL HIGH (ref 70–99)
Glucose, Bld: 116 mg/dL — ABNORMAL HIGH (ref 70–99)
Glucose, Bld: 121 mg/dL — ABNORMAL HIGH (ref 70–99)
Glucose, Bld: 141 mg/dL — ABNORMAL HIGH (ref 70–99)
Glucose, Bld: 136 mg/dL — ABNORMAL HIGH (ref 70–99)
HCT: 41 % (ref 39.0–52.0)
HCT: 40 % (ref 39.0–52.0)
HCT: 34 % — ABNORMAL LOW (ref 39.0–52.0)
HCT: 35 % — ABNORMAL LOW (ref 39.0–52.0)
HCT: 33 % — ABNORMAL LOW (ref 39.0–52.0)
Hemoglobin: 13.9 g/dL (ref 13.0–17.0)
Hemoglobin: 13.6 g/dL (ref 13.0–17.0)
Hemoglobin: 11.6 g/dL — ABNORMAL LOW (ref 13.0–17.0)
Hemoglobin: 11.9 g/dL — ABNORMAL LOW (ref 13.0–17.0)
Hemoglobin: 11.2 g/dL — ABNORMAL LOW (ref 13.0–17.0)
Potassium: 3.9 mmol/L (ref 3.5–5.1)
Potassium: 4.4 mmol/L (ref 3.5–5.1)
Potassium: 4.9 mmol/L (ref 3.5–5.1)
Potassium: 5.3 mmol/L — ABNORMAL HIGH (ref 3.5–5.1)
Potassium: 4.4 mmol/L (ref 3.5–5.1)
Sodium: 138 mmol/L (ref 135–145)
Sodium: 136 mmol/L (ref 135–145)
Sodium: 135 mmol/L (ref 135–145)
Sodium: 138 mmol/L (ref 135–145)
Sodium: 138 mmol/L (ref 135–145)
TCO2: 28 mmol/L (ref 22–32)
TCO2: 27 mmol/L (ref 22–32)
TCO2: 25 mmol/L (ref 22–32)
TCO2: 24 mmol/L (ref 22–32)
TCO2: 22 mmol/L (ref 22–32)

## 2022-08-03 LAB — CBC
HCT: 43.9 % (ref 39.0–52.0)
HCT: 38.2 % — ABNORMAL LOW (ref 39.0–52.0)
HCT: 33.9 % — ABNORMAL LOW (ref 39.0–52.0)
Hemoglobin: 15.1 g/dL (ref 13.0–17.0)
Hemoglobin: 13.2 g/dL (ref 13.0–17.0)
Hemoglobin: 11.3 g/dL — ABNORMAL LOW (ref 13.0–17.0)
MCH: 33.3 pg (ref 26.0–34.0)
MCH: 33.8 pg (ref 26.0–34.0)
MCH: 33.4 pg (ref 26.0–34.0)
MCHC: 34.4 g/dL (ref 30.0–36.0)
MCHC: 34.6 g/dL (ref 30.0–36.0)
MCHC: 33.3 g/dL (ref 30.0–36.0)
MCV: 96.7 fL (ref 80.0–100.0)
MCV: 97.7 fL (ref 80.0–100.0)
MCV: 100.3 fL — ABNORMAL HIGH (ref 80.0–100.0)
Platelets: 171 10*3/uL (ref 150–400)
Platelets: 207 10*3/uL (ref 150–400)
Platelets: 203 10*3/uL (ref 150–400)
RBC: 4.54 MIL/uL (ref 4.22–5.81)
RBC: 3.91 MIL/uL — ABNORMAL LOW (ref 4.22–5.81)
RBC: 3.38 MIL/uL — ABNORMAL LOW (ref 4.22–5.81)
RDW: 13.7 % (ref 11.5–15.5)
RDW: 13.7 % (ref 11.5–15.5)
RDW: 13.7 % (ref 11.5–15.5)
WBC: 6.1 10*3/uL (ref 4.0–10.5)
WBC: 18.7 10*3/uL — ABNORMAL HIGH (ref 4.0–10.5)
WBC: 22.4 10*3/uL — ABNORMAL HIGH (ref 4.0–10.5)
nRBC: 0 % (ref 0.0–0.2)
nRBC: 0 % (ref 0.0–0.2)
nRBC: 0 % (ref 0.0–0.2)

## 2022-08-03 LAB — BASIC METABOLIC PANEL
Anion gap: 8 (ref 5–15)
Anion gap: 7 (ref 5–15)
BUN: 12 mg/dL (ref 8–23)
BUN: 14 mg/dL (ref 8–23)
CO2: 25 mmol/L (ref 22–32)
CO2: 22 mmol/L (ref 22–32)
Calcium: 9.3 mg/dL (ref 8.9–10.3)
Calcium: 7.6 mg/dL — ABNORMAL LOW (ref 8.9–10.3)
Chloride: 103 mmol/L (ref 98–111)
Chloride: 107 mmol/L (ref 98–111)
Creatinine, Ser: 0.9 mg/dL (ref 0.61–1.24)
Creatinine, Ser: 0.84 mg/dL (ref 0.61–1.24)
GFR, Estimated: >60 mL/min (ref >60)
GFR, Estimated: >60 mL/min (ref >60)
Glucose, Bld: 115 mg/dL — ABNORMAL HIGH (ref 70–99)
Glucose, Bld: 172 mg/dL — ABNORMAL HIGH (ref 70–99)
Potassium: 3.9 mmol/L (ref 3.5–5.1)
Potassium: 4.3 mmol/L (ref 3.5–5.1)
Sodium: 136 mmol/L (ref 135–145)
Sodium: 136 mmol/L (ref 135–145)

## 2022-08-03 LAB — GLUCOSE, CAPILLARY
Glucose-Capillary: 146 mg/dL — ABNORMAL HIGH (ref 70–99)
Glucose-Capillary: 127 mg/dL — ABNORMAL HIGH (ref 70–99)
Glucose-Capillary: 134 mg/dL — ABNORMAL HIGH (ref 70–99)
Glucose-Capillary: 132 mg/dL — ABNORMAL HIGH (ref 70–99)
Glucose-Capillary: 120 mg/dL — ABNORMAL HIGH (ref 70–99)
Glucose-Capillary: 140 mg/dL — ABNORMAL HIGH (ref 70–99)

## 2022-08-03 LAB — POCT I-STAT EG7
Acid-Base Excess: 1 mmol/L (ref 0.0–2.0)
Bicarbonate: 26.5 mmol/L (ref 20.0–28.0)
Calcium, Ion: 1.07 mmol/L — ABNORMAL LOW (ref 1.15–1.40)
HCT: 32 % — ABNORMAL LOW (ref 39.0–52.0)
Hemoglobin: 10.9 g/dL — ABNORMAL LOW (ref 13.0–17.0)
O2 Saturation: 86 %
Potassium: 4.4 mmol/L (ref 3.5–5.1)
Sodium: 135 mmol/L (ref 135–145)
TCO2: 28 mmol/L (ref 22–32)
pCO2, Ven: 47.4 mmHg (ref 44–60)
pH, Ven: 7.355 (ref 7.25–7.43)
pO2, Ven: 54 mmHg — ABNORMAL HIGH (ref 32–45)

## 2022-08-03 LAB — HEMOGLOBIN AND HEMATOCRIT, BLOOD
HCT: 35.8 % — ABNORMAL LOW (ref 39.0–52.0)
Hemoglobin: 12.1 g/dL — ABNORMAL LOW (ref 13.0–17.0)

## 2022-08-03 LAB — PROTIME-INR
INR: 1.3 — ABNORMAL HIGH (ref 0.8–1.2)
Prothrombin Time: 15.7 seconds — ABNORMAL HIGH (ref 11.4–15.2)

## 2022-08-03 LAB — APTT: aPTT: 37 seconds — ABNORMAL HIGH (ref 24–36)

## 2022-08-03 LAB — PLATELET COUNT: Platelets: 171 10*3/uL (ref 150–400)

## 2022-08-03 LAB — MAGNESIUM: Magnesium: 2.8 mg/dL — ABNORMAL HIGH (ref 1.7–2.4)

## 2022-08-03 LAB — LIPOPROTEIN A (LPA): Lipoprotein (a): 25.3 nmol/L (ref <75.0)

## 2022-08-03 LAB — SARS CORONAVIRUS 2 BY RT PCR: SARS Coronavirus 2 by RT PCR: NEGATIVE

## 2022-08-03 SURGERY — CORONARY ARTERY BYPASS GRAFTING (CABG)
Anesthesia: General | Site: Chest

## 2022-08-03 MED ORDER — ROCURONIUM BROMIDE 10 MG/ML (PF) SYRINGE
PREFILLED_SYRINGE | INTRAVENOUS | Status: AC
  Filled 2022-08-03: qty 30

## 2022-08-03 MED ORDER — ASPIRIN 81 MG PO CHEW: 324.0000 mg | CHEWABLE_TABLET | Freq: Every day | ORAL | Status: DC

## 2022-08-03 MED ORDER — SODIUM CHLORIDE (PF) 0.9 % IJ SOLN
INTRAMUSCULAR | Status: AC
  Filled 2022-08-03: qty 10

## 2022-08-03 MED ORDER — METOPROLOL TARTRATE 5 MG/5ML IV SOLN
2.5000 mg | INTRAVENOUS | Status: DC | PRN
  Administered 2022-08-06 (×3): 5 mg via INTRAVENOUS
  Filled 2022-08-03 (×3): qty 5

## 2022-08-03 MED ORDER — SODIUM CHLORIDE 0.9% FLUSH: 3.0000 mL | INTRAVENOUS | Status: DC | PRN

## 2022-08-03 MED ORDER — MIDAZOLAM HCL 2 MG/2ML IJ SOLN
2.0000 mg | INTRAMUSCULAR | Status: DC | PRN
  Filled 2022-08-03: qty 2

## 2022-08-03 MED ORDER — NITROGLYCERIN IN D5W 200-5 MCG/ML-% IV SOLN: 0.0000 ug/min | INTRAVENOUS | Status: DC

## 2022-08-03 MED ORDER — CHLORHEXIDINE GLUCONATE 0.12 % MT SOLN
15.0000 mL | OROMUCOSAL | Status: AC
  Administered 2022-08-03: 15 mL via OROMUCOSAL

## 2022-08-03 MED ORDER — HEPARIN SODIUM (PORCINE) 1000 UNIT/ML IJ SOLN
INTRAMUSCULAR | Status: AC
  Filled 2022-08-03: qty 2

## 2022-08-03 MED ORDER — LACTATED RINGERS IV SOLN: INTRAVENOUS | Status: DC

## 2022-08-03 MED ORDER — FENTANYL CITRATE (PF) 250 MCG/5ML IJ SOLN
INTRAMUSCULAR | Status: AC
  Filled 2022-08-03: qty 5

## 2022-08-03 MED ORDER — CHLORHEXIDINE GLUCONATE CLOTH 2 % EX PADS
6.0000 | MEDICATED_PAD | Freq: Every day | CUTANEOUS | Status: DC
  Administered 2022-08-04 – 2022-08-09 (×4): 6 via TOPICAL

## 2022-08-03 MED ORDER — DEXTROSE 50 % IV SOLN: 0.0000 mL | INTRAVENOUS | Status: DC | PRN

## 2022-08-03 MED ORDER — METOPROLOL TARTRATE 25 MG/10 ML ORAL SUSPENSION: 12.5000 mg | Freq: Two times a day (BID) | ORAL | Status: DC

## 2022-08-03 MED ORDER — ACETAMINOPHEN 160 MG/5ML PO SOLN: 1000.0000 mg | Freq: Four times a day (QID) | ORAL | Status: AC

## 2022-08-03 MED ORDER — LACTATED RINGERS IV SOLN: INTRAVENOUS | Status: DC | PRN

## 2022-08-03 MED ORDER — THROMBIN 20000 UNITS EX SOLR
CUTANEOUS | Status: DC | PRN
  Administered 2022-08-03: 20000 [IU] via TOPICAL

## 2022-08-03 MED ORDER — ROCURONIUM BROMIDE 10 MG/ML (PF) SYRINGE
PREFILLED_SYRINGE | INTRAVENOUS | Status: DC | PRN
  Administered 2022-08-03 (×4): 100 mg via INTRAVENOUS

## 2022-08-03 MED ORDER — FAMOTIDINE IN NACL 20-0.9 MG/50ML-% IV SOLN
20.0000 mg | Freq: Two times a day (BID) | INTRAVENOUS | Status: AC
  Administered 2022-08-03: 20 mg via INTRAVENOUS
  Filled 2022-08-03: qty 50

## 2022-08-03 MED ORDER — MIDAZOLAM HCL (PF) 5 MG/ML IJ SOLN
INTRAMUSCULAR | Status: DC | PRN
  Administered 2022-08-03: 4 mg via INTRAVENOUS
  Administered 2022-08-03: 1 mg via INTRAVENOUS
  Administered 2022-08-03: 2 mg via INTRAVENOUS

## 2022-08-03 MED ORDER — THROMBIN (RECOMBINANT) 20000 UNITS EX SOLR
CUTANEOUS | Status: AC
  Filled 2022-08-03: qty 20000

## 2022-08-03 MED ORDER — INSULIN REGULAR(HUMAN) IN NACL 100-0.9 UT/100ML-% IV SOLN: INTRAVENOUS | Status: DC

## 2022-08-03 MED ORDER — PROTAMINE SULFATE 10 MG/ML IV SOLN
INTRAVENOUS | Status: AC
  Filled 2022-08-03: qty 50

## 2022-08-03 MED ORDER — MIDAZOLAM HCL (PF) 10 MG/2ML IJ SOLN
INTRAMUSCULAR | Status: AC
  Filled 2022-08-03: qty 2

## 2022-08-03 MED ORDER — ROCURONIUM BROMIDE 10 MG/ML (PF) SYRINGE
PREFILLED_SYRINGE | INTRAVENOUS | Status: AC
  Filled 2022-08-03: qty 10

## 2022-08-03 MED ORDER — TRANEXAMIC ACID (OHS) BOLUS VIA INFUSION
15.0000 mg/kg | INTRAVENOUS | Status: DC
  Filled 2022-08-03: qty 1302

## 2022-08-03 MED ORDER — BISACODYL 5 MG PO TBEC
10.0000 mg | DELAYED_RELEASE_TABLET | Freq: Every day | ORAL | Status: DC
  Administered 2022-08-04 – 2022-08-09 (×6): 10 mg via ORAL
  Filled 2022-08-03 (×6): qty 2

## 2022-08-03 MED ORDER — PROPOFOL 10 MG/ML IV BOLUS
INTRAVENOUS | Status: AC
  Filled 2022-08-03: qty 20

## 2022-08-03 MED ORDER — PROTAMINE SULFATE 10 MG/ML IV SOLN
INTRAVENOUS | Status: DC | PRN
  Administered 2022-08-03: 400 mg via INTRAVENOUS

## 2022-08-03 MED ORDER — ACETAMINOPHEN 500 MG PO TABS
1000.0000 mg | ORAL_TABLET | Freq: Four times a day (QID) | ORAL | Status: AC
  Administered 2022-08-04 – 2022-08-08 (×18): 1000 mg via ORAL
  Filled 2022-08-03 (×18): qty 2

## 2022-08-03 MED ORDER — PLASMA-LYTE A IV SOLN: INTRAVENOUS | Status: DC | PRN

## 2022-08-03 MED ORDER — ACETAMINOPHEN 650 MG RE SUPP: 650.0000 mg | Freq: Once | RECTAL | Status: DC

## 2022-08-03 MED ORDER — PROPOFOL 10 MG/ML IV BOLUS
INTRAVENOUS | Status: DC | PRN
  Administered 2022-08-03: 20 mg via INTRAVENOUS

## 2022-08-03 MED ORDER — PHENYLEPHRINE HCL-NACL 20-0.9 MG/250ML-% IV SOLN
0.0000 ug/min | INTRAVENOUS | Status: DC
  Filled 2022-08-03 (×2): qty 250

## 2022-08-03 MED ORDER — PHENYLEPHRINE 80 MCG/ML (10ML) SYRINGE FOR IV PUSH (FOR BLOOD PRESSURE SUPPORT)
PREFILLED_SYRINGE | INTRAVENOUS | Status: DC | PRN
  Administered 2022-08-03 (×2): 40 ug via INTRAVENOUS
  Administered 2022-08-03: 80 ug via INTRAVENOUS
  Administered 2022-08-03: 40 ug via INTRAVENOUS
  Administered 2022-08-03: 160 ug via INTRAVENOUS
  Administered 2022-08-03: 80 ug via INTRAVENOUS
  Administered 2022-08-03 (×2): 40 ug via INTRAVENOUS
  Administered 2022-08-03 (×7): 80 ug via INTRAVENOUS

## 2022-08-03 MED ORDER — ONDANSETRON HCL 4 MG/2ML IJ SOLN
4.0000 mg | Freq: Four times a day (QID) | INTRAMUSCULAR | Status: DC | PRN
  Administered 2022-08-04: 4 mg via INTRAVENOUS
  Filled 2022-08-03: qty 2

## 2022-08-03 MED ORDER — ALBUMIN HUMAN 5 % IV SOLN
250.0000 mL | INTRAVENOUS | Status: AC | PRN
  Administered 2022-08-03 (×4): 12.5 g via INTRAVENOUS
  Filled 2022-08-03 (×3): qty 250

## 2022-08-03 MED ORDER — FENTANYL CITRATE (PF) 250 MCG/5ML IJ SOLN
INTRAMUSCULAR | Status: DC | PRN
  Administered 2022-08-03 (×4): 100 ug via INTRAVENOUS
  Administered 2022-08-03: 50 ug via INTRAVENOUS
  Administered 2022-08-03: 100 ug via INTRAVENOUS
  Administered 2022-08-03 (×3): 50 ug via INTRAVENOUS
  Administered 2022-08-03 (×3): 100 ug via INTRAVENOUS
  Administered 2022-08-03: 200 ug via INTRAVENOUS
  Administered 2022-08-03 (×2): 100 ug via INTRAVENOUS

## 2022-08-03 MED ORDER — THROMBIN 20000 UNITS EX SOLR: OROMUCOSAL | Status: DC | PRN

## 2022-08-03 MED ORDER — VANCOMYCIN HCL IN DEXTROSE 1-5 GM/200ML-% IV SOLN
1000.0000 mg | Freq: Once | INTRAVENOUS | Status: AC
  Administered 2022-08-03: 1000 mg via INTRAVENOUS
  Filled 2022-08-03: qty 200

## 2022-08-03 MED ORDER — ALBUMIN HUMAN 5 % IV SOLN: INTRAVENOUS | Status: DC | PRN

## 2022-08-03 MED ORDER — HEMOSTATIC AGENTS (NO CHARGE) OPTIME
TOPICAL | Status: DC | PRN
  Administered 2022-08-03: 1 via TOPICAL

## 2022-08-03 MED ORDER — OXYCODONE HCL 5 MG PO TABS
5.0000 mg | ORAL_TABLET | ORAL | Status: DC | PRN
  Administered 2022-08-04 (×2): 5 mg via ORAL
  Administered 2022-08-06: 10 mg via ORAL
  Administered 2022-08-07 (×2): 5 mg via ORAL
  Administered 2022-08-07 – 2022-08-13 (×7): 10 mg via ORAL
  Filled 2022-08-03 (×4): qty 2
  Filled 2022-08-03: qty 1
  Filled 2022-08-03 (×3): qty 2
  Filled 2022-08-03: qty 1
  Filled 2022-08-03 (×2): qty 2
  Filled 2022-08-03 (×2): qty 1

## 2022-08-03 MED ORDER — SODIUM CHLORIDE 0.9 % IV SOLN: INTRAVENOUS | Status: DC | PRN

## 2022-08-03 MED ORDER — SODIUM CHLORIDE 0.45 % IV SOLN: INTRAVENOUS | Status: DC | PRN

## 2022-08-03 MED ORDER — POTASSIUM CHLORIDE 10 MEQ/50ML IV SOLN: 10.0000 meq | INTRAVENOUS | Status: AC

## 2022-08-03 MED ORDER — BISACODYL 10 MG RE SUPP: 10.0000 mg | Freq: Every day | RECTAL | Status: DC

## 2022-08-03 MED ORDER — MORPHINE SULFATE (PF) 2 MG/ML IV SOLN
1.0000 mg | INTRAVENOUS | Status: DC | PRN
  Administered 2022-08-03 (×3): 2 mg via INTRAVENOUS
  Administered 2022-08-04 (×3): 4 mg via INTRAVENOUS
  Filled 2022-08-03: qty 2
  Filled 2022-08-03: qty 1
  Filled 2022-08-03 (×3): qty 2

## 2022-08-03 MED ORDER — TRAMADOL HCL 50 MG PO TABS
50.0000 mg | ORAL_TABLET | ORAL | Status: DC | PRN
  Administered 2022-08-07: 50 mg via ORAL
  Administered 2022-08-08 – 2022-08-09 (×2): 100 mg via ORAL
  Administered 2022-08-10: 50 mg via ORAL
  Administered 2022-08-11: 100 mg via ORAL
  Filled 2022-08-03 (×3): qty 2
  Filled 2022-08-03 (×2): qty 1

## 2022-08-03 MED ORDER — LACTATED RINGERS IV SOLN: 500.0000 mL | Freq: Once | INTRAVENOUS | Status: DC | PRN

## 2022-08-03 MED ORDER — SODIUM CHLORIDE 0.9% FLUSH
3.0000 mL | Freq: Two times a day (BID) | INTRAVENOUS | Status: DC
  Administered 2022-08-04 – 2022-08-09 (×10): 3 mL via INTRAVENOUS

## 2022-08-03 MED ORDER — HEPARIN SODIUM (PORCINE) 1000 UNIT/ML IJ SOLN
INTRAMUSCULAR | Status: DC | PRN
  Administered 2022-08-03: 50000 [IU] via INTRAVENOUS

## 2022-08-03 MED ORDER — SODIUM CHLORIDE 0.9 % IV SOLN
250.0000 mL | INTRAVENOUS | Status: DC
  Administered 2022-08-04: 250 mL via INTRAVENOUS

## 2022-08-03 MED ORDER — 0.9 % SODIUM CHLORIDE (POUR BTL) OPTIME
TOPICAL | Status: DC | PRN
  Administered 2022-08-03: 5000 mL

## 2022-08-03 MED ORDER — LIDOCAINE 2% (20 MG/ML) 5 ML SYRINGE
INTRAMUSCULAR | Status: AC
  Filled 2022-08-03: qty 5

## 2022-08-03 MED ORDER — PANTOPRAZOLE SODIUM 40 MG PO TBEC
40.0000 mg | DELAYED_RELEASE_TABLET | Freq: Every day | ORAL | Status: DC
  Administered 2022-08-05 – 2022-08-18 (×14): 40 mg via ORAL
  Filled 2022-08-03 (×14): qty 1

## 2022-08-03 MED ORDER — SODIUM CHLORIDE 0.9 % IV SOLN: INTRAVENOUS | Status: DC

## 2022-08-03 MED ORDER — METOPROLOL TARTRATE 12.5 MG HALF TABLET
12.5000 mg | ORAL_TABLET | Freq: Two times a day (BID) | ORAL | Status: DC
  Administered 2022-08-04 (×2): 12.5 mg via ORAL
  Filled 2022-08-03 (×2): qty 1

## 2022-08-03 MED ORDER — ACETAMINOPHEN 160 MG/5ML PO SOLN: 650.0000 mg | Freq: Once | ORAL | Status: DC

## 2022-08-03 MED ORDER — DOCUSATE SODIUM 100 MG PO CAPS
200.0000 mg | ORAL_CAPSULE | Freq: Every day | ORAL | Status: DC
  Administered 2022-08-04 – 2022-08-09 (×6): 200 mg via ORAL
  Filled 2022-08-03 (×6): qty 2

## 2022-08-03 MED ORDER — ASPIRIN 325 MG PO TBEC
325.0000 mg | DELAYED_RELEASE_TABLET | Freq: Every day | ORAL | Status: DC
  Administered 2022-08-04 – 2022-08-06 (×3): 325 mg via ORAL
  Filled 2022-08-03 (×3): qty 1

## 2022-08-03 MED ORDER — LIDOCAINE 2% (20 MG/ML) 5 ML SYRINGE
INTRAMUSCULAR | Status: DC | PRN
  Administered 2022-08-03: 60 mg via INTRAVENOUS

## 2022-08-03 MED ORDER — CEFAZOLIN SODIUM-DEXTROSE 2-4 GM/100ML-% IV SOLN
2.0000 g | Freq: Three times a day (TID) | INTRAVENOUS | Status: AC
  Administered 2022-08-03 – 2022-08-05 (×6): 2 g via INTRAVENOUS
  Filled 2022-08-03 (×6): qty 100

## 2022-08-03 MED ORDER — TRANEXAMIC ACID (OHS) BOLUS VIA INFUSION
15.0000 mg/kg | INTRAVENOUS | Status: AC
  Administered 2022-08-03: 2025 mg via INTRAVENOUS
  Filled 2022-08-03: qty 2025

## 2022-08-03 MED ORDER — MAGNESIUM SULFATE 4 GM/100ML IV SOLN
4.0000 g | Freq: Once | INTRAVENOUS | Status: AC
  Administered 2022-08-03: 4 g via INTRAVENOUS
  Filled 2022-08-03: qty 100

## 2022-08-03 MED ORDER — DEXMEDETOMIDINE HCL IN NACL 400 MCG/100ML IV SOLN
0.0000 ug/kg/h | INTRAVENOUS | Status: DC
  Administered 2022-08-03: 0.7 ug/kg/h via INTRAVENOUS
  Filled 2022-08-03 (×2): qty 100

## 2022-08-03 MED ORDER — PHENYLEPHRINE 80 MCG/ML (10ML) SYRINGE FOR IV PUSH (FOR BLOOD PRESSURE SUPPORT)
PREFILLED_SYRINGE | INTRAVENOUS | Status: AC
  Filled 2022-08-03: qty 20

## 2022-08-03 SURGICAL SUPPLY — 110 items
ADAPTER MULTI PERFUSION 15 (ADAPTER) IMPLANT
BAG DECANTER FOR FLEXI CONT (MISCELLANEOUS) ×2 IMPLANT
BATTERY MAXDRIVER (MISCELLANEOUS) IMPLANT
BLADE CLIPPER SURG (BLADE) ×2 IMPLANT
BLADE STERNUM SYSTEM 6 (BLADE) ×2 IMPLANT
BNDG ELASTIC 4X5.8 VLCR STR LF (GAUZE/BANDAGES/DRESSINGS) ×2 IMPLANT
BNDG ELASTIC 6X5.8 VLCR STR LF (GAUZE/BANDAGES/DRESSINGS) ×2 IMPLANT
BNDG GAUZE DERMACEA FLUFF 4 (GAUZE/BANDAGES/DRESSINGS) ×2 IMPLANT
CANISTER SUCT 3000ML PPV (MISCELLANEOUS) ×2 IMPLANT
CANNULA MC2 2 STG 36/46 NON-V (CANNULA) IMPLANT
CANNULA VENOUS 2 STG 34/46 (CANNULA) ×2
CATH CPB KIT GERHARDT (MISCELLANEOUS) IMPLANT
CATH ROBINSON RED A/P 18FR (CATHETERS) ×4 IMPLANT
CATH THORACIC 28FR (CATHETERS) ×2 IMPLANT
CATH THORACIC 36FR (CATHETERS) ×2 IMPLANT
CATH THORACIC 36FR RT ANG (CATHETERS) ×2 IMPLANT
CLIP TI WIDE RED SMALL 24 (CLIP) IMPLANT
CLIP VESOCCLUDE MED 24/CT (CLIP) IMPLANT
CLIP VESOCCLUDE SM WIDE 24/CT (CLIP) IMPLANT
CONTAINER PROTECT SURGISLUSH (MISCELLANEOUS) ×4 IMPLANT
DERMABOND ADVANCED .7 DNX12 (GAUZE/BANDAGES/DRESSINGS) IMPLANT
DRAPE CARDIOVASCULAR INCISE (DRAPES) ×2
DRAPE SRG 135X102X78XABS (DRAPES) ×2 IMPLANT
DRAPE WARM FLUID 44X44 (DRAPES) ×2 IMPLANT
DRSG COVADERM 4X14 (GAUZE/BANDAGES/DRESSINGS) ×2 IMPLANT
ELECT BLADE 4.0 EZ CLEAN MEGAD (MISCELLANEOUS) ×2
ELECT CAUTERY BLADE 6.4 (BLADE) ×2 IMPLANT
ELECT REM PT RETURN 9FT ADLT (ELECTROSURGICAL) ×4
ELECTRODE BLDE 4.0 EZ CLN MEGD (MISCELLANEOUS) IMPLANT
ELECTRODE REM PT RTRN 9FT ADLT (ELECTROSURGICAL) ×4 IMPLANT
FELT TEFLON 1X6 (MISCELLANEOUS) ×4 IMPLANT
GAUZE 4X4 16PLY ~~LOC~~+RFID DBL (SPONGE) ×2 IMPLANT
GAUZE SPONGE 4X4 12PLY STRL (GAUZE/BANDAGES/DRESSINGS) ×4 IMPLANT
GLOVE BIO SURGEON STRL SZ 6 (GLOVE) IMPLANT
GLOVE BIO SURGEON STRL SZ 6.5 (GLOVE) IMPLANT
GLOVE BIO SURGEON STRL SZ7 (GLOVE) IMPLANT
GLOVE BIO SURGEON STRL SZ7.5 (GLOVE) IMPLANT
GLOVE BIOGEL PI IND STRL 6 (GLOVE) IMPLANT
GLOVE BIOGEL PI IND STRL 6.5 (GLOVE) IMPLANT
GLOVE BIOGEL PI IND STRL 7.0 (GLOVE) IMPLANT
GLOVE ORTHO TXT STRL SZ7.5 (GLOVE) IMPLANT
GLOVE SURG MICRO LTX SZ7 (GLOVE) ×4 IMPLANT
GLOVE SURG SS PI 7.5 STRL IVOR (GLOVE) IMPLANT
GOWN STRL REUS W/ TWL LRG LVL3 (GOWN DISPOSABLE) ×8 IMPLANT
GOWN STRL REUS W/ TWL XL LVL3 (GOWN DISPOSABLE) ×2 IMPLANT
GOWN STRL REUS W/TWL LRG LVL3 (GOWN DISPOSABLE) ×8
GOWN STRL REUS W/TWL XL LVL3 (GOWN DISPOSABLE) ×2
HEMOSTAT POWDER SURGIFOAM 1G (HEMOSTASIS) ×6 IMPLANT
HEMOSTAT SURGICEL 2X14 (HEMOSTASIS) ×2 IMPLANT
INSERT FOGARTY 61MM (MISCELLANEOUS) IMPLANT
INSERT FOGARTY XLG (MISCELLANEOUS) IMPLANT
KIT BASIN OR (CUSTOM PROCEDURE TRAY) ×2 IMPLANT
KIT CATH CPB BARTLE (MISCELLANEOUS) ×2 IMPLANT
KIT SUCTION CATH 14FR (SUCTIONS) ×2 IMPLANT
KIT TURNOVER KIT B (KITS) ×2 IMPLANT
KIT VASOVIEW HEMOPRO 2 VH 4000 (KITS) ×2 IMPLANT
NS IRRIG 1000ML POUR BTL (IV SOLUTION) ×10 IMPLANT
PACK E OPEN HEART (SUTURE) ×2 IMPLANT
PACK OPEN HEART (CUSTOM PROCEDURE TRAY) ×2 IMPLANT
PAD ARMBOARD 7.5X6 YLW CONV (MISCELLANEOUS) ×4 IMPLANT
PAD ELECT DEFIB RADIOL ZOLL (MISCELLANEOUS) ×2 IMPLANT
PENCIL BUTTON HOLSTER BLD 10FT (ELECTRODE) ×2 IMPLANT
PLATE BONE LOCK TI 1.8 XSH 8H (Plate) IMPLANT
POSITIONER HEAD DONUT 9IN (MISCELLANEOUS) ×2 IMPLANT
POUCH SELF SEAL 5X15 (STERILIZATION PRODUCTS) IMPLANT
PUNCH AORTIC ROTATE 4.0MM (MISCELLANEOUS) IMPLANT
PUNCH AORTIC ROTATE 4.5MM 8IN (MISCELLANEOUS) ×2 IMPLANT
PUNCH AORTIC ROTATE 5MM 8IN (MISCELLANEOUS) IMPLANT
SCREW LOCKING TI 2.3X13MM (Screw) IMPLANT
SCREW STERNAL 2.3X17MM (Screw) IMPLANT
SET MPS 3-ND DEL (MISCELLANEOUS) IMPLANT
SPONGE INTESTINAL PEANUT (DISPOSABLE) IMPLANT
SPONGE T-LAP 18X18 ~~LOC~~+RFID (SPONGE) ×8 IMPLANT
SPONGE T-LAP 4X18 ~~LOC~~+RFID (SPONGE) ×4 IMPLANT
SUPPORT HEART JANKE-BARRON (MISCELLANEOUS) ×2 IMPLANT
SUT BONE WAX W31G (SUTURE) ×2 IMPLANT
SUT MNCRL AB 4-0 PS2 18 (SUTURE) IMPLANT
SUT PROLENE 3 0 SH DA (SUTURE) IMPLANT
SUT PROLENE 3 0 SH1 36 (SUTURE) ×2 IMPLANT
SUT PROLENE 4 0 RB 1 (SUTURE) ×10
SUT PROLENE 4 0 SH DA (SUTURE) IMPLANT
SUT PROLENE 4-0 RB1 .5 CRCL 36 (SUTURE) IMPLANT
SUT PROLENE 5 0 C 1 36 (SUTURE) IMPLANT
SUT PROLENE 6 0 C 1 30 (SUTURE) IMPLANT
SUT PROLENE 7 0 BV 1 (SUTURE) IMPLANT
SUT PROLENE 7 0 BV1 MDA (SUTURE) ×2 IMPLANT
SUT PROLENE 8 0 BV175 6 (SUTURE) IMPLANT
SUT SILK  1 MH (SUTURE)
SUT SILK 1 MH (SUTURE) IMPLANT
SUT SILK 2 0 SH (SUTURE) IMPLANT
SUT STEEL STERNAL CCS#1 18IN (SUTURE) IMPLANT
SUT STEEL SZ 6 DBL 3X14 BALL (SUTURE) IMPLANT
SUT VIC AB 1 CTX 36 (SUTURE) ×4
SUT VIC AB 1 CTX36XBRD ANBCTR (SUTURE) ×4 IMPLANT
SUT VIC AB 2-0 CT1 27 (SUTURE) ×2
SUT VIC AB 2-0 CT1 TAPERPNT 27 (SUTURE) IMPLANT
SUT VIC AB 2-0 CTX 27 (SUTURE) IMPLANT
SUT VIC AB 3-0 SH 27 (SUTURE)
SUT VIC AB 3-0 SH 27X BRD (SUTURE) IMPLANT
SUT VIC AB 3-0 X1 27 (SUTURE) IMPLANT
SUT VICRYL 4-0 PS2 18IN ABS (SUTURE) IMPLANT
SYSTEM SAHARA CHEST DRAIN ATS (WOUND CARE) ×2 IMPLANT
TAPE CLOTH 4X10 WHT NS (GAUZE/BANDAGES/DRESSINGS) IMPLANT
TAPE PAPER 3X10 WHT MICROPORE (GAUZE/BANDAGES/DRESSINGS) IMPLANT
TOWEL GREEN STERILE (TOWEL DISPOSABLE) ×2 IMPLANT
TOWEL GREEN STERILE FF (TOWEL DISPOSABLE) ×2 IMPLANT
TRAY FOLEY SLVR 16FR TEMP STAT (SET/KITS/TRAYS/PACK) ×2 IMPLANT
TUBING LAP HI FLOW INSUFFLATIO (TUBING) ×2 IMPLANT
UNDERPAD 30X36 HEAVY ABSORB (UNDERPADS AND DIAPERS) ×2 IMPLANT
WATER STERILE IRR 1000ML POUR (IV SOLUTION) ×4 IMPLANT

## 2022-08-03 NOTE — Brief Op Note (Signed)
08/01/2022 - 08/03/2022  2:24 PM  PATIENT:  Hector Edwards  76 y.o. male  PRE-OPERATIVE DIAGNOSIS:  CORONARY ARTERY DISEASE  POST-OPERATIVE DIAGNOSIS:  CORONARY ARTERY DISEASE  PROCEDURE:   CORONARY ARTERY BYPASS GRAFTING (CABG) X 3, USING RIGHT GREATER SAPHENOUS VEIN AND LEFT INTERNAL MAMMARY ARTERY. (N/A) -LIMA to LAD -RSVG to PDA -RSVG to PLA Vein harvest time: 38mn Vein prep time: 146m TRANSESOPHAGEAL ECHOCARDIOGRAM (TEE) (N/A)  SURGEON:  Surgeon(s) and Role:  BaGaye PollackMD - Primary  PHYSICIAN ASSISTANT: BaWynelle BeckmannA-C, MyEnid CutterA-C  ASSISTANTS: MaAra KussmaulNFA   ANESTHESIA:   general  EBL:  315 mL   BLOOD ADMINISTERED:none  DRAINS:  Mediastinal and pleural chest tubes    LOCAL MEDICATIONS USED:  NONE  SPECIMEN:  No Specimen  DISPOSITION OF SPECIMEN:  N/A  COUNTS CORRECT:  YES  DICTATION: .Dragon Dictation  PLAN OF CARE: Admit to inpatient   PATIENT DISPOSITION:  ICU - intubated and hemodynamically stable.   Delay start of Pharmacological VTE agent (>24hrs) due to surgical blood loss or risk of bleeding: yes

## 2022-08-03 NOTE — Progress Notes (Signed)
     [id number]       [place] [address]             [phone]       EVENING ROUNDS   POD #0 SP CABG x 3 Awaiting extubation Not bleeding BP and filling pressures ok

## 2022-08-03 NOTE — Hospital Course (Addendum)
History of Present Illness:     Mr. Hector Edwards is a 76 year old male with a  past medical history of HTN, HLD, obesity, GERD, depression, sleep apnea (does not tolerate CPAP), CAD and mild aortic stenosis. He presented to the cardiologist on 06/13/22 complaining of worsening dyspnea on exertion and fatigue for about 1 year as well as occasional LE swelling. He was seen by Dr. Irish Lack and underwent an echo in July 2017 which showed mild LVH and no valvular disease. Nuclear stress test was also done in 2017 and showed no ischemia. Echocardiogram on 06/25/19 showed LVEF 55-60% with mild aortic valve sclerosis but no stenosis at this time. Echocardiogram on 07/05/22 showed LVEF 60-65% and mild aortic stenosis with a mean gradient of 12. Coronary CTA on 07/24/22 showed severe disease in the LAD and RCA. He underwent a cardiac catheterization on 08/01/22 which revealed severe distal left main/three vessel CAD. Including severe, heavily calcified eccentric distal left main stenosis 80%, severe mid LAD stenosis 80%, severe ostial Circumflex stenosis 70%, and large dominant RCA with severe ostial stenosis 80%, severe mid to distal RCA stenosis 90%, RPAV 99% stenosis, RPDA 80% stenosis. The patient states overall for the past year he has been increasingly fatigued and short of breath with exertion but is still able to mow the lawn. He denies chest pain, orthopnea, and dizziness. He is currently resting comfortably in bed and denies dyspnea and chest pain. He has  family history of heart disease and MI in his father and brother. He has no history of varicose veins. 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  Dr. Cyndia Bent reviewed the patient's chart, labs and diagnostic studies and determined surgical revascularization would provide the patient the best long term treatment. Dr. Caffie Pinto reviewed Hector Edwards treatment options as well as the risks and benefits of surgery. Hector Edwards was agreeable to  proceed with coronary artery bypass grafting surgery.   Hospital Course: The patient was an inpatient at [facility] and was brought to the operating room on 08/03/22. He underwent a CABG x 3 utilizing LIMA to LAD, RSVG to PDA and RSVG to PLA. He tolerated the procedure well and was transferred to the SICU in stable condition. He was extubated later the evening of surgery. He was weaned off the Neo Synephrine drip. He was volume overloaded. He was started on Lopressor. He was transitioned off the Insulin drip. His pre op HGA1C was 5.4. Accu checks and SS will be stopped after transfer. EPW were removed on 10/29.  He developed Atrial Fibrillation with RVR.  He was treated with IV Amiodarone bolus and drip protocol.  He responded well to diuretics with weight returning to baseline.  Due to this diuretics were discontinued.  He did not convert to NSR despite IV Amiodarone.  He was started on oral cardizem and digoxin to assist with rate control.  EP consult was obtained.  He was started on Eliquis. Despite medication regimen patient's Atrial fibrillation/flutter persisted.  He underwent cardioversion by EP on 08/08/2022.  He has maintained NSR since cardioversion.  He was transitioned off IV amiodarone to an oral regimen. He was stable for transfer to the progressive care unit on 08/09/2022.  He had a first degree heart block but remained in NSR. He remained on cardizem and beta blocker was held. CXR showed trivial bilateral pleural effusions, left greater than right this was monitored. He was not volume overloaded and was saturating well on room air. He  remained hypertensive so his home Benazepril was restarted at '20mg'$ . His incisions were healing well. He had minimal ambulation since surgery so a PT/OT consult was obtained and recommended home health PT/OT and [facility] which was ordered accordingly. He had a few short runs of rate controlled atrial fibrillation but converted to normal sinus rhythm without  difficulty. CXR showed prominent central pulmonary vessels suggesting CHF and increased small to moderate bilateral pleural effusions which were treated with one dose of IV diuretic and he was restarted on a daily oral diuretic. The patient developed LUE superficial phlebitis around his peripheral IV site likely due to amiodarone infusion and was started on 1 week of oral cefdinir as well as 3x daily warm compresses. Patient became diaphoretic and presyncopal during stair training with PT/OT. He admits that he will only have assistance at home for one night and lives alone in a 3 story home. PT/OT reported a decline in functional status and updated their discharge recommendation to SNF. TOC was consulted for assistance with SNF placement. Cardiology ordered an echocardiogram due to the findings on CXR. Echocardiogram on 11/06 was suboptimal due to the patient's obesity, rapid atrial fibrillation and recent surgery but showed LVEF 60-65%. He continued to convert in and out of atrial fibrillation. Per cardiology he was started on metoprolol tartrate '25mg'$  BID for better rate control. CXR on 11/07 showed improved left pleural effusion after aggressive diuresis. He remained fatigued and lethargic so his diltiazem and pain medication was discontinued. Patient's weakness and dizziness improved after discontinuing diltiazem. He converted to atrial fibrillation with RVR. Low dose diltiazem was restarted. He converted to normal sinus rhythm and tolerated diltiazem '180mg'$ . He progressed well and deconditioning improved. His incisions were healing well. He was felt medically stable for discharge.

## 2022-08-03 NOTE — Anesthesia Procedure Notes (Signed)
Procedure Name: Intubation Date/Time: 08/03/2022 7:48 AM  Performed by: Janace Litten, CRNAPre-anesthesia Checklist: Patient identified, Emergency Drugs available, Suction available and Patient being monitored Patient Re-evaluated:Patient Re-evaluated prior to induction Oxygen Delivery Method: Circle System Utilized Preoxygenation: Pre-oxygenation with 100% oxygen Induction Type: IV induction Ventilation: Oral airway inserted - appropriate to patient size and Two handed mask ventilation required Laryngoscope Size: Glidescope and 4 Grade View: Grade I Tube type: Oral Tube size: 8.0 mm Number of attempts: 1 Airway Equipment and Method: Stylet, Oral airway and Video-laryngoscopy Placement Confirmation: ETT inserted through vocal cords under direct vision, positive ETCO2 and breath sounds checked- equal and bilateral Secured at: 24 cm Tube secured with: Tape Dental Injury: Teeth and Oropharynx as per pre-operative assessment  Difficulty Due To: Difficulty was anticipated, Difficult Airway- due to anterior larynx, Difficult Airway- due to large tongue, Difficult Airway- due to limited oral opening and Difficult Airway- due to reduced neck mobility Comments: Elective glidescope d/t airway indices.

## 2022-08-03 NOTE — Transfer of Care (Signed)
Immediate Anesthesia Transfer of Care Note  Patient: Hector Edwards  Procedure(s) Performed: CORONARY ARTERY BYPASS GRAFTING (CABG) 3X, USING RIGHT GREATER SAPHENOUS VEIN AND LEFT INTERNAL MAMMARY ARTERY. (Chest) TRANSESOPHAGEAL ECHOCARDIOGRAM (TEE)  Patient Location: ICU  Anesthesia Type:General  Level of Consciousness: Patient remains intubated per anesthesia plan  Airway & Oxygen Therapy: Patient remains intubated per anesthesia plan and Patient placed on Ventilator (see vital sign flow sheet for setting)  Post-op Assessment: Report given to RN and Post -op Vital signs reviewed and stable  Post vital signs: Reviewed and stable  Last Vitals:  Vitals Value Taken Time  BP    Temp    Pulse 84 08/03/22 1427  Resp 7 08/03/22 1427  SpO2 97 % 08/03/22 1427  Vitals shown include unvalidated device data.  Last Pain:  Vitals:   08/02/22 2300  TempSrc:   PainSc: 0-No pain      Patients Stated Pain Goal: 0 (54/27/06 2376)  Complications:  Encounter Notable Events  Notable Event Outcome Phase Comment  Difficult to intubate - expected  Intraprocedure Filed from anesthesia note documentation.

## 2022-08-03 NOTE — Anesthesia Procedure Notes (Signed)
Central Venous Catheter Insertion Performed by: Suzette Battiest, MD, anesthesiologist Start/End10/27/2023 7:05 AM, 08/03/2022 7:15 AM Patient location: Pre-op. Preanesthetic checklist: patient identified, IV checked, site marked, risks and benefits discussed, surgical consent, monitors and equipment checked, pre-op evaluation, timeout performed and anesthesia consent Position: Trendelenburg Lidocaine 1% used for infiltration and patient sedated Hand hygiene performed , maximum sterile barriers used  and Seldinger technique used Catheter size: 8.5 Fr Total catheter length 10. Central line was placed.Sheath introducer Swan type:thermodilution PA Cath depth:55 Procedure performed using ultrasound guided technique. Ultrasound Notes:anatomy identified, needle tip was noted to be adjacent to the nerve/plexus identified, no ultrasound evidence of intravascular and/or intraneural injection and image(s) printed for medical record Attempts: 2 Following insertion, line sutured, dressing applied and Biopatch. Post procedure assessment: blood return through all ports, free fluid flow and no air  Patient tolerated the procedure well with no immediate complications.

## 2022-08-03 NOTE — Op Note (Signed)
CARDIOVASCULAR SURGERY OPERATIVE NOTE  08/03/2022  Surgeon:  Gaye Pollack, MD  First Assistant: Wynelle Beckmann PA-C and Enid Cutter PA-C:   An experienced assistant was required given the complexity of this surgery and the standard of surgical care. The assistant was needed for saphenous vein harvesting, exposure, dissection, suctioning, retraction of delicate tissues and sutures, instrument exchange and for overall help during this procedure.   Preoperative Diagnosis:  Severe left main and multi-vessel coronary artery disease   Postoperative Diagnosis:  Same   Procedure:  Median Sternotomy Extracorporeal circulation 3.   Coronary artery bypass grafting x 3  Left internal mammary artery graft to the LAD SVG to PDA SVG to PL (RCA)  4.   Endoscopic vein harvest from the right leg   Anesthesia:  General Endotracheal   Clinical History/Surgical Indication:  He has severe LM and 3 vessel CAD. Echo shows normal LVEF and mild AS. I think he LAD, diagonal, PD and PL are graftable. I am not sure about his LCX marginal. It may be too small where it will be visible. I agree that CABG is the best treatment for him. I discussed the operative procedure with the patient  including alternatives, benefits and risks; including but not limited to bleeding, blood transfusion, infection, stroke, myocardial infarction, graft failure, heart block requiring a permanent pacemaker, organ dysfunction, and death.  Sagan J Portal understands and agrees to proceed.  Preparation:  The patient was seen in the preoperative holding area and the correct patient, correct operation were confirmed with the patient after reviewing the medical record and catheterization. The consent was signed by me. Preoperative antibiotics were given. A pulmonary arterial line and radial arterial line were placed by the anesthesia  team. The patient was taken back to the operating room and positioned supine on the operating room table. After being placed under general endotracheal anesthesia by the anesthesia team a foley catheter was placed. The neck, chest, abdomen, and both legs were prepped with betadine soap and solution and draped in the usual sterile manner. A surgical time-out was taken and the correct patient and operative procedure were confirmed with the nursing and anesthesia staff.  TEE performed by Dr. Suzette Battiest. This showed normal LV systolic function. The aortic valve was mildly calcified with mild restriction of leaflet mobility. The mean gradient was only 10 mm Hg. I did not feel that AVR was indicated at this time.   Cardiopulmonary Bypass:  A median sternotomy was performed. Exposure was more difficult due to his pectus excavatum sternal deformity. The pericardium was opened in the midline. Right ventricular function appeared normal. The ascending aorta was of normal size and had no palpable plaque. There were no contraindications to aortic cannulation or cross-clamping. The patient was fully systemically heparinized and the ACT was maintained > 400 sec. The proximal aortic arch was cannulated with a 54 F aortic cannula for arterial inflow. Venous cannulation was performed via the right atrial appendage using a two-staged venous cannula. An antegrade cardioplegia/vent cannula was inserted into the mid-ascending aorta. Aortic occlusion was performed with a single cross-clamp. Systemic cooling to 32 degrees Centigrade and topical cooling of the heart with iced saline were used. Hyperkalemic antegrade cold blood cardioplegia was used to induce diastolic arrest and was then given at about 20 minute intervals throughout the period of arrest to maintain myocardial temperature at or below 10 degrees centigrade. A temperature probe was inserted into the interventricular septum and an insulating pad was placed in the  pericardium.   Left internal mammary artery harvest:  The left side of the sternum was retracted using the Rultract retractor. The left internal mammary artery was harvested as a pedicle graft. All side branches were clipped. It was a large-sized vessel of good quality with excellent blood flow. It was ligated distally and divided. It was sprayed with topical papaverine solution to prevent vasospasm.   Endoscopic vein harvest:  The right greater saphenous vein was harvested endoscopically through a 2 cm incision medial to the right knee. It was harvested from the upper thigh to below the knee. It was a medium-sized vein of good quality. The side branches were all ligated with 4-0 silk ties.    Coronary arteries:  The coronary arteries were examined.  LAD:  diffusely diseased out into the distal vessel. The diagonal branches were small and diffusely diseased with calcific plaque and not graftable. LCX:  OM was small and diffusely diseased, not graftable. RCA:  PDA was a medium caliber vessel that was heavily diseased with calcific plaque proximally but the mid to distal vessel was graftable. The PL was diffusely diseased with calcific plaque and I had to go very distally just before the final bifurcation to be able to open the vessel.   Grafts:  LIMA to the LAD: 1.75 mm. It was sewn end to side using 8-0 prolene continuous suture. SVG to PDA:  1.6 mm. It was sewn end to side using 7-0 prolene continuous suture. SVG to PL:  1.6 mm. It was sewn end to side using 7-0 prolene continuous suture.  The proximal vein graft anastomoses were performed to the mid-ascending aorta using continuous 6-0 prolene suture. Graft markers were placed around the proximal anastomoses.   Completion:  The patient was rewarmed to 37 degrees Centigrade. The clamp was removed from the LIMA pedicle and there was rapid warming of the septum and return of ventricular fibrillation. The crossclamp was removed with a  time of 85 minutes. There was spontaneous return of sinus rhythm. The distal and proximal anastomoses were checked for hemostasis. The position of the grafts was satisfactory. Two temporary epicardial pacing wires were placed on the right atrium and two on the right ventricle. The patient was weaned from CPB without difficulty on no inotropes. CPB time was 106 minutes. Cardiac output was 5 LPM. Heparin was fully reversed with protamine and the aortic and venous cannulas removed. Hemostasis was achieved. Mediastinal and left pleural drainage tubes were placed. The sternum was closed with double four #6 stainless steel wires. I could not get the sternal wires around or through the distal sternum and it was very hard when I opened it with the saw. Therefore a KLS Martin sternal plate was used distally with screws.  The fascia was closed with continuous # 1 vicryl suture. The subcutaneous tissue was closed with 2-0 vicryl continuous suture. The skin was closed with 3-0 vicryl subcuticular suture. All sponge, needle, and instrument counts were reported correct at the end of the case. Dry sterile dressings were placed over the incisions and around the chest tubes which were connected to pleurevac suction. The patient was then transported to the surgical intensive care unit in stable condition.

## 2022-08-03 NOTE — Anesthesia Postprocedure Evaluation (Signed)
Anesthesia Post Note  Patient: Hector Edwards  Procedure(s) Performed: CORONARY ARTERY BYPASS GRAFTING (CABG) 3X, USING RIGHT GREATER SAPHENOUS VEIN AND LEFT INTERNAL MAMMARY ARTERY. (Chest) TRANSESOPHAGEAL ECHOCARDIOGRAM (TEE)     Patient location during evaluation: SICU Anesthesia Type: General Level of consciousness: sedated Pain management: pain level controlled Vital Signs Assessment: post-procedure vital signs reviewed and stable Respiratory status: patient remains intubated per anesthesia plan Cardiovascular status: stable Postop Assessment: no apparent nausea or vomiting Anesthetic complications: yes   Encounter Notable Events  Notable Event Outcome Phase Comment  Difficult to intubate - expected  Intraprocedure Filed from anesthesia note documentation.    Last Vitals:  Vitals:   08/03/22 1515 08/03/22 1530  BP:    Pulse: 82 82  Resp: 12 14  Temp: 37.3 C 37.4 C  SpO2: 97% 98%    Last Pain:  Vitals:   08/02/22 2300  TempSrc:   PainSc: 0-No pain                 [name]

## 2022-08-03 NOTE — Interval H&P Note (Signed)
History and Physical Interval Note:  08/03/2022 6:39 AM  Hector Edwards  has presented today for surgery, with the diagnosis of CAD.  The various methods of treatment have been discussed with the patient and family. After consideration of risks, benefits and other options for treatment, the patient has consented to  Procedure(s): CORONARY ARTERY BYPASS GRAFTING (CABG) (N/A) TRANSESOPHAGEAL ECHOCARDIOGRAM (TEE) (N/A) as a surgical intervention.  The patient's history has been reviewed, patient examined, no change in status, stable for surgery.  I have reviewed the patient's chart and labs.  Questions were answered to the patient's satisfaction.     Gaye Pollack

## 2022-08-03 NOTE — Anesthesia Preprocedure Evaluation (Signed)
Anesthesia Evaluation  Patient identified by MRN, date of birth, ID band Patient awake    Reviewed: Allergy & Precautions, NPO status , Patient's Chart, lab work & pertinent test results  Airway Mallampati: IV  TM Distance: <3 FB Neck ROM: Limited    Dental  (+) Dental Advisory Given   Pulmonary sleep apnea ,    breath sounds clear to auscultation       Cardiovascular hypertension, Pt. on medications + angina + CAD   Rhythm:Regular Rate:Normal     Neuro/Psych negative neurological ROS     GI/Hepatic Neg liver ROS, GERD  ,  Endo/Other  negative endocrine ROS  Renal/GU negative Renal ROS     Musculoskeletal  (+) Arthritis ,   Abdominal   Peds  Hematology negative hematology ROS (+)   Anesthesia Other Findings   Reproductive/Obstetrics                             Lab Results  Component Value Date   WBC 6.1 08/03/2022   HGB 13.6 08/03/2022   HCT 40.0 08/03/2022   MCV 96.7 08/03/2022   PLT 171 08/03/2022   Lab Results  Component Value Date   CREATININE 0.80 08/03/2022   BUN 13 08/03/2022   NA 137 08/03/2022   K 4.0 08/03/2022   CL 100 08/03/2022   CO2 25 08/03/2022    Anesthesia Physical Anesthesia Plan  ASA: 4  Anesthesia Plan: General   Post-op Pain Management:    Induction: Intravenous  PONV Risk Score and Plan: 2 and Dexamethasone, Ondansetron and Treatment may vary due to age or medical condition  Airway Management Planned: Oral ETT and Video Laryngoscope Planned  Additional Equipment:   Intra-op Plan:   Post-operative Plan: Extubation in OR  Informed Consent: I have reviewed the patients History and Physical, chart, labs and discussed the procedure including the risks, benefits and alternatives for the proposed anesthesia with the patient or authorized representative who has indicated his/her understanding and acceptance.     Dental advisory given  Plan  Discussed with: CRNA  Anesthesia Plan Comments:         Anesthesia Quick Evaluation

## 2022-08-03 NOTE — Anesthesia Procedure Notes (Signed)
Central Venous Catheter Insertion Performed by: Suzette Battiest, MD, anesthesiologist Start/End10/27/2023 7:05 AM, 08/03/2022 7:15 AM Patient location: Pre-op. Preanesthetic checklist: patient identified, IV checked, site marked, risks and benefits discussed, surgical consent, monitors and equipment checked, pre-op evaluation, timeout performed and anesthesia consent Hand hygiene performed  and maximum sterile barriers used  PA cath was placed.Swan type:thermodilution PA Cath depth:55 Procedure performed without using ultrasound guided technique. Attempts: 1 Patient tolerated the procedure well with no immediate complications.

## 2022-08-03 NOTE — Procedures (Signed)
Extubation Procedure Note  Patient Details:   Name: Hector Edwards DOB: Dec 31, 1945 MRN: [id number]   Airway Documentation:  Airway 8 mm (Active)  Secured at (cm) 26 cm 08/03/22 2013  Measured From Lips 08/03/22 2013  Secured Location Right 08/03/22 2013  Secured By Pink Tape 08/03/22 2013  Prone position No 08/03/22 2013  Site Condition Dry 08/03/22 2013   Vent end date: (not recorded) Vent end time: (not recorded)   Evaluation  O2 sats: stable throughout Complications: No apparent complications Patient did tolerate procedure well. Bilateral Breath Sounds: Clear, Diminished   Yes Pt extubated to 4L [place] w/o complications Positive Cuff Leak FVC: 1.06 L NIF: -25 Audra Bellard V 08/03/2022, 9:01 PM

## 2022-08-04 ENCOUNTER — Inpatient Hospital Stay (HOSPITAL_COMMUNITY): Payer: Medicare HMO

## 2022-08-04 LAB — CBC
HCT: 28.7 % — ABNORMAL LOW (ref 39.0–52.0)
HCT: 29.2 % — ABNORMAL LOW (ref 39.0–52.0)
Hemoglobin: 9.8 g/dL — ABNORMAL LOW (ref 13.0–17.0)
Hemoglobin: 10 g/dL — ABNORMAL LOW (ref 13.0–17.0)
MCH: 33.6 pg (ref 26.0–34.0)
MCH: 34.2 pg — ABNORMAL HIGH (ref 26.0–34.0)
MCHC: 34.1 g/dL (ref 30.0–36.0)
MCHC: 34.2 g/dL (ref 30.0–36.0)
MCV: 98.3 fL (ref 80.0–100.0)
MCV: 100 fL (ref 80.0–100.0)
Platelets: 164 10*3/uL (ref 150–400)
Platelets: 158 10*3/uL (ref 150–400)
RBC: 2.92 MIL/uL — ABNORMAL LOW (ref 4.22–5.81)
RBC: 2.92 MIL/uL — ABNORMAL LOW (ref 4.22–5.81)
RDW: 13.9 % (ref 11.5–15.5)
RDW: 14.2 % (ref 11.5–15.5)
WBC: 23.1 10*3/uL — ABNORMAL HIGH (ref 4.0–10.5)
WBC: 27.2 10*3/uL — ABNORMAL HIGH (ref 4.0–10.5)
nRBC: 0 % (ref 0.0–0.2)
nRBC: 0 % (ref 0.0–0.2)

## 2022-08-04 LAB — BASIC METABOLIC PANEL
Anion gap: 9 (ref 5–15)
Anion gap: 9 (ref 5–15)
BUN: 15 mg/dL (ref 8–23)
BUN: 19 mg/dL (ref 8–23)
CO2: 21 mmol/L — ABNORMAL LOW (ref 22–32)
CO2: 21 mmol/L — ABNORMAL LOW (ref 22–32)
Calcium: 7.6 mg/dL — ABNORMAL LOW (ref 8.9–10.3)
Calcium: 8 mg/dL — ABNORMAL LOW (ref 8.9–10.3)
Chloride: 105 mmol/L (ref 98–111)
Chloride: 104 mmol/L (ref 98–111)
Creatinine, Ser: 1 mg/dL (ref 0.61–1.24)
Creatinine, Ser: 1.06 mg/dL (ref 0.61–1.24)
GFR, Estimated: >60 mL/min (ref >60)
GFR, Estimated: >60 mL/min (ref >60)
Glucose, Bld: 145 mg/dL — ABNORMAL HIGH (ref 70–99)
Glucose, Bld: 168 mg/dL — ABNORMAL HIGH (ref 70–99)
Potassium: 3.7 mmol/L (ref 3.5–5.1)
Potassium: 4.1 mmol/L (ref 3.5–5.1)
Sodium: 135 mmol/L (ref 135–145)
Sodium: 134 mmol/L — ABNORMAL LOW (ref 135–145)

## 2022-08-04 LAB — GLUCOSE, CAPILLARY
Glucose-Capillary: 136 mg/dL — ABNORMAL HIGH (ref 70–99)
Glucose-Capillary: 138 mg/dL — ABNORMAL HIGH (ref 70–99)
Glucose-Capillary: 139 mg/dL — ABNORMAL HIGH (ref 70–99)
Glucose-Capillary: 139 mg/dL — ABNORMAL HIGH (ref 70–99)
Glucose-Capillary: 140 mg/dL — ABNORMAL HIGH (ref 70–99)
Glucose-Capillary: 142 mg/dL — ABNORMAL HIGH (ref 70–99)
Glucose-Capillary: 143 mg/dL — ABNORMAL HIGH (ref 70–99)
Glucose-Capillary: 148 mg/dL — ABNORMAL HIGH (ref 70–99)

## 2022-08-04 LAB — MAGNESIUM
Magnesium: 2.6 mg/dL — ABNORMAL HIGH (ref 1.7–2.4)
Magnesium: 2.6 mg/dL — ABNORMAL HIGH (ref 1.7–2.4)

## 2022-08-04 MED ORDER — INSULIN ASPART 100 UNIT/ML IJ SOLN
0.0000 [IU] | INTRAMUSCULAR | Status: DC
  Administered 2022-08-04: 3 [IU] via SUBCUTANEOUS
  Administered 2022-08-04 – 2022-08-05 (×2): 2 [IU] via SUBCUTANEOUS
  Administered 2022-08-05: 4 [IU] via SUBCUTANEOUS
  Administered 2022-08-05 – 2022-08-06 (×6): 2 [IU] via SUBCUTANEOUS

## 2022-08-04 MED ORDER — POTASSIUM CHLORIDE 10 MEQ/50ML IV SOLN
10.0000 meq | INTRAVENOUS | Status: AC
  Administered 2022-08-04 (×3): 10 meq via INTRAVENOUS
  Filled 2022-08-04 (×3): qty 50

## 2022-08-04 NOTE — Progress Notes (Signed)
[address]       [place] [address]             [phone]      1 Day Post-Op  Procedure(s) (LRB): CORONARY ARTERY BYPASS GRAFTING (CABG) 3X, USING RIGHT GREATER SAPHENOUS VEIN AND LEFT INTERNAL MAMMARY ARTERY. (N/A) TRANSESOPHAGEAL ECHOCARDIOGRAM (TEE) (N/A)   Total Length of Stay:  LOS: 3 days    SUBJECTIVE: Feels like run over by a bus but overall ok Not SOB Not been out of bed yet Weaning Neo Vitals:   08/04/22 0800 08/04/22 0911  BP: (!) 124/56 115/62  Pulse: 89 90  Resp: (!) 26   Temp: 99.7 F (37.6 C)   SpO2: 98%     Intake/Output      10/27 0701 10/28 0700 10/28 0701 10/29 0700   P.O.     I.V. (mL/kg) 3692.7 (27.4)    Blood 215    IV Piggyback 2581.5    Total Intake(mL/kg) 6489.2 (48.1)    Urine (mL/kg/hr) 2610 (0.8) 125 (0.4)   Blood 315    Chest Tube 1030 20   Total Output 3955 145   Net +2534.2 -145            sodium chloride     sodium chloride     sodium chloride 10 mL/hr at 08/04/22 0700    ceFAZolin (ANCEF) IV Stopped (08/04/22 0552)   dexmedetomidine (PRECEDEX) IV infusion 0.1 mcg/kg/hr (08/04/22 0700)   famotidine (PEPCID) IV Stopped (08/03/22 1538)   insulin 1.9 Units/hr (08/04/22 0700)   lactated ringers     lactated ringers 20 mL/hr at 08/04/22 0700   lactated ringers 20 mL/hr at 08/04/22 0700   nitroGLYCERIN     phenylephrine (NEO-SYNEPHRINE) Adult infusion 30 mcg/min (08/04/22 0700)    CBC    Component Value Date/Time   WBC 23.1 (H) 08/04/2022 0351   RBC 2.92 (L) 08/04/2022 0351   HGB 9.8 (L) 08/04/2022 0351   HGB 15.1 07/27/2022 0904   HCT 28.7 (L) 08/04/2022 0351   HCT 43.5 07/27/2022 0904   PLT 164 08/04/2022 0351   PLT 165 07/27/2022 0904   MCV 98.3 08/04/2022 0351   MCV 95 07/27/2022 0904   MCH 33.6 08/04/2022 0351   MCHC 34.1 08/04/2022 0351   RDW 13.9 08/04/2022 0351   RDW 13.2 07/27/2022 0904   LYMPHSABS 2.0 06/05/2022 1518   LYMPHSABS 1.9 06/16/2019 1035   MONOABS 1.3 (H)  06/05/2022 1518   EOSABS 0.0 06/05/2022 1518   EOSABS 0.1 06/16/2019 1035   BASOSABS 0.1 06/05/2022 1518   BASOSABS 0.0 06/16/2019 1035   CMP     Component Value Date/Time   NA 135 08/04/2022 0351   NA 139 07/27/2022 0904   K 3.7 08/04/2022 0351   CL 105 08/04/2022 0351   CO2 21 (L) 08/04/2022 0351   GLUCOSE 145 (H) 08/04/2022 0351   BUN 15 08/04/2022 0351   BUN 12 07/27/2022 0904   CREATININE 1.00 08/04/2022 0351   CREATININE 0.87 05/05/2014 0948   CALCIUM 7.6 (L) 08/04/2022 0351   PROT 7.3 06/05/2022 1518   PROT 7.5 06/16/2019 1035   ALBUMIN 4.3 06/05/2022 1518   ALBUMIN 4.4 06/16/2019 1035   AST 20 06/05/2022 1518   ALT 35 06/05/2022 1518   ALKPHOS 57 06/05/2022 1518   BILITOT 0.4 06/05/2022 1518   BILITOT 0.6 06/16/2019 1035   GFRNONAA >60 08/04/2022 0351   GFRNONAA >89 05/05/2014 0948   GFRAA 103 06/16/2019  [place] 05/05/2014 0948   ABG    Component Value Date/Time   PHART 7.342 (L) 08/03/2022 2048   PCO2ART 36.6 08/03/2022 2048   PO2ART 96 08/03/2022 2048   HCO3 19.6 (L) 08/03/2022 2048   TCO2 21 (L) 08/03/2022 2048   ACIDBASEDEF 5.0 (H) 08/03/2022 2048   O2SAT 97 08/03/2022 2048   CBG (last 3)  Recent Labs    08/04/22 0357 08/04/22 0611 08/04/22 0806  GLUCAP 143* 138* 140*   EXAM Lungs: decreased bilaterally Card: RR  Ext: cool   ASSESSMENT: POD# 1 SP CABG Hemodynamics good. Will remove swan and get neo off Hold on diuretic today Start BB Keep CT in today OOB to chair   Coralie Common, MD '@DATE'$ @

## 2022-08-04 NOTE — Progress Notes (Signed)
     [address]       [place] [address]             [phone]       EVENING ROUNDS  Comfortable sitting in chair Cts 170 for the shift Slightly tachy BP good

## 2022-08-05 LAB — BASIC METABOLIC PANEL
Anion gap: 8 (ref 5–15)
BUN: 20 mg/dL (ref 8–23)
CO2: 25 mmol/L (ref 22–32)
Calcium: 8.3 mg/dL — ABNORMAL LOW (ref 8.9–10.3)
Chloride: 101 mmol/L (ref 98–111)
Creatinine, Ser: 0.94 mg/dL (ref 0.61–1.24)
GFR, Estimated: >60 mL/min (ref >60)
Glucose, Bld: 139 mg/dL — ABNORMAL HIGH (ref 70–99)
Potassium: 4.3 mmol/L (ref 3.5–5.1)
Sodium: 134 mmol/L — ABNORMAL LOW (ref 135–145)

## 2022-08-05 LAB — GLUCOSE, CAPILLARY
Glucose-Capillary: 147 mg/dL — ABNORMAL HIGH (ref 70–99)
Glucose-Capillary: 184 mg/dL — ABNORMAL HIGH (ref 70–99)
Glucose-Capillary: 139 mg/dL — ABNORMAL HIGH (ref 70–99)
Glucose-Capillary: 149 mg/dL — ABNORMAL HIGH (ref 70–99)
Glucose-Capillary: 127 mg/dL — ABNORMAL HIGH (ref 70–99)
Glucose-Capillary: 126 mg/dL — ABNORMAL HIGH (ref 70–99)

## 2022-08-05 LAB — CBC
HCT: 27.2 % — ABNORMAL LOW (ref 39.0–52.0)
Hemoglobin: 8.9 g/dL — ABNORMAL LOW (ref 13.0–17.0)
MCH: 33.3 pg (ref 26.0–34.0)
MCHC: 32.7 g/dL (ref 30.0–36.0)
MCV: 101.9 fL — ABNORMAL HIGH (ref 80.0–100.0)
Platelets: 124 10*3/uL — ABNORMAL LOW (ref 150–400)
RBC: 2.67 MIL/uL — ABNORMAL LOW (ref 4.22–5.81)
RDW: 14 % (ref 11.5–15.5)
WBC: 20.5 10*3/uL — ABNORMAL HIGH (ref 4.0–10.5)
nRBC: 0 % (ref 0.0–0.2)

## 2022-08-05 MED ORDER — POTASSIUM CHLORIDE CRYS ER 20 MEQ PO TBCR
20.0000 meq | EXTENDED_RELEASE_TABLET | Freq: Two times a day (BID) | ORAL | Status: AC
  Administered 2022-08-05 – 2022-08-06 (×4): 20 meq via ORAL
  Filled 2022-08-05 (×4): qty 1

## 2022-08-05 MED ORDER — METOPROLOL TARTRATE 25 MG PO TABS
37.5000 mg | ORAL_TABLET | Freq: Two times a day (BID) | ORAL | Status: DC
  Administered 2022-08-05 – 2022-08-06 (×2): 37.5 mg via ORAL
  Filled 2022-08-05 (×2): qty 1

## 2022-08-05 MED ORDER — SODIUM CHLORIDE 0.9% FLUSH: 3.0000 mL | INTRAVENOUS | Status: DC | PRN

## 2022-08-05 MED ORDER — SODIUM CHLORIDE 0.9 % IV SOLN: 250.0000 mL | INTRAVENOUS | Status: DC | PRN

## 2022-08-05 MED ORDER — SODIUM CHLORIDE 0.9% FLUSH
3.0000 mL | Freq: Two times a day (BID) | INTRAVENOUS | Status: DC
  Administered 2022-08-05 (×2): 3 mL via INTRAVENOUS

## 2022-08-05 MED ORDER — ~~LOC~~ CARDIAC SURGERY, PATIENT & FAMILY EDUCATION: Freq: Once | Status: AC

## 2022-08-05 MED ORDER — METOPROLOL TARTRATE 25 MG PO TABS
25.0000 mg | ORAL_TABLET | Freq: Two times a day (BID) | ORAL | Status: DC
  Administered 2022-08-05: 25 mg via ORAL
  Filled 2022-08-05: qty 1

## 2022-08-05 MED ORDER — FUROSEMIDE 10 MG/ML IJ SOLN
40.0000 mg | Freq: Two times a day (BID) | INTRAMUSCULAR | Status: AC
  Administered 2022-08-05 (×2): 40 mg via INTRAVENOUS
  Filled 2022-08-05 (×2): qty 4

## 2022-08-05 NOTE — Progress Notes (Signed)
     [address]       [place] [address]             [phone]       EVENING ROUNDS  Feels better Only walked once Will increase BB

## 2022-08-05 NOTE — Progress Notes (Signed)
[address]       ,[place] [address]             [phone]      2 Days Post-Op  Procedure(s) (LRB): CORONARY ARTERY BYPASS GRAFTING (CABG) 3X, USING RIGHT GREATER SAPHENOUS VEIN AND LEFT INTERNAL MAMMARY ARTERY. (N/A) TRANSESOPHAGEAL ECHOCARDIOGRAM (TEE) (N/A)   Total Length of Stay:  LOS: 4 days    SUBJECTIVE: Ambulated this am without issue No complaints Vitals:   08/05/22 0757 08/05/22 0800  BP:  139/79  Pulse:  (!) 102  Resp:  19  Temp: 97.6 F (36.4 C)   SpO2:  93%    Intake/Output      10/28 0701 10/29 0700 10/29 0701 10/30 0700   I.V. (mL/kg) 832.6 (6) 209.5 (1.5)   Blood     IV Piggyback 381.7 18.4   Total Intake(mL/kg) 1214.4 (8.7) 227.9 (1.6)   Urine (mL/kg/hr) 930 (0.3) 125 (0.5)   Blood     Chest Tube 210    Total Output 1140 125   Net +74.4 +102.9            sodium chloride     sodium chloride Stopped (08/04/22 1533)   sodium chloride Stopped (08/05/22 0758)   dexmedetomidine (PRECEDEX) IV infusion Stopped (08/04/22 0715)   lactated ringers     lactated ringers Stopped (08/04/22 1535)   lactated ringers 20 mL/hr at 08/05/22 0800   nitroGLYCERIN     phenylephrine (NEO-SYNEPHRINE) Adult infusion Stopped (08/04/22 1041)    CBC    Component Value Date/Time   WBC 20.5 (H) 08/05/2022 0327   RBC 2.67 (L) 08/05/2022 0327   HGB 8.9 (L) 08/05/2022 0327   HGB 15.1 07/27/2022 0904   HCT 27.2 (L) 08/05/2022 0327   HCT 43.5 07/27/2022 0904   PLT 124 (L) 08/05/2022 0327   PLT 165 07/27/2022 0904   MCV 101.9 (H) 08/05/2022 0327   MCV 95 07/27/2022 0904   MCH 33.3 08/05/2022 0327   MCHC 32.7 08/05/2022 0327   RDW 14.0 08/05/2022 0327   RDW 13.2 07/27/2022 0904   LYMPHSABS 2.0 06/05/2022 1518   LYMPHSABS 1.9 06/16/2019 1035   MONOABS 1.3 (H) 06/05/2022 1518   EOSABS 0.0 06/05/2022 1518   EOSABS 0.1 06/16/2019 1035   BASOSABS 0.1 06/05/2022 1518   BASOSABS 0.0 06/16/2019 1035   CMP     Component Value Date/Time    NA 134 (L) 08/05/2022 0327   NA 139 07/27/2022 0904   K 4.3 08/05/2022 0327   CL 101 08/05/2022 0327   CO2 25 08/05/2022 0327   GLUCOSE 139 (H) 08/05/2022 0327   BUN 20 08/05/2022 0327   BUN 12 07/27/2022 0904   CREATININE 0.94 08/05/2022 0327   CREATININE 0.87 05/05/2014 0948   CALCIUM 8.3 (L) 08/05/2022 0327   PROT 7.3 06/05/2022 1518   PROT 7.5 06/16/2019 1035   ALBUMIN 4.3 06/05/2022 1518   ALBUMIN 4.4 06/16/2019 1035   AST 20 06/05/2022 1518   ALT 35 06/05/2022 1518   ALKPHOS 57 06/05/2022 1518   BILITOT 0.4 06/05/2022 1518   BILITOT 0.6 06/16/2019 1035   GFRNONAA >60 08/05/2022 0327   GFRNONAA >89 05/05/2014 0948   GFRAA 103 06/16/2019 1035   GFRAA >89 05/05/2014 0948   ABG    Component Value Date/Time   PHART 7.342 (L) 08/03/2022 2048   PCO2ART 36.6 08/03/2022 2048   PO2ART 96 08/03/2022 2048   HCO3 19.6 (L) 08/03/2022 2048  TCO2 21 (L) 08/03/2022 2048   ACIDBASEDEF 5.0 (H) 08/03/2022 2048   O2SAT 97 08/03/2022 2048   CBG (last 3)  Recent Labs    08/04/22 1127 08/05/22 0333 08/05/22 0755  GLUCAP 136* 147* 184*   EXAM Lungs: clear but decreased at bases Card: RR  Ext: warm   ASSESSMENT: POD #2 SP Cabg Doing well Remove CT and Foley and Pws this am Diuresis BB Transfer to floor   Coralie Common, MD '@DATE'$ @

## 2022-08-06 LAB — CBC
HCT: 25.5 % — ABNORMAL LOW (ref 39.0–52.0)
Hemoglobin: 8.7 g/dL — ABNORMAL LOW (ref 13.0–17.0)
MCH: 33.6 pg (ref 26.0–34.0)
MCHC: 34.1 g/dL (ref 30.0–36.0)
MCV: 98.5 fL (ref 80.0–100.0)
Platelets: 146 10*3/uL — ABNORMAL LOW (ref 150–400)
RBC: 2.59 MIL/uL — ABNORMAL LOW (ref 4.22–5.81)
RDW: 14.1 % (ref 11.5–15.5)
WBC: 14.4 10*3/uL — ABNORMAL HIGH (ref 4.0–10.5)
nRBC: 0 % (ref 0.0–0.2)

## 2022-08-06 LAB — BASIC METABOLIC PANEL
Anion gap: 7 (ref 5–15)
BUN: 19 mg/dL (ref 8–23)
CO2: 25 mmol/L (ref 22–32)
Calcium: 8.4 mg/dL — ABNORMAL LOW (ref 8.9–10.3)
Chloride: 104 mmol/L (ref 98–111)
Creatinine, Ser: 0.86 mg/dL (ref 0.61–1.24)
GFR, Estimated: >60 mL/min (ref >60)
Glucose, Bld: 123 mg/dL — ABNORMAL HIGH (ref 70–99)
Potassium: 3.8 mmol/L (ref 3.5–5.1)
Sodium: 136 mmol/L (ref 135–145)

## 2022-08-06 LAB — GLUCOSE, CAPILLARY
Glucose-Capillary: 123 mg/dL — ABNORMAL HIGH (ref 70–99)
Glucose-Capillary: 144 mg/dL — ABNORMAL HIGH (ref 70–99)
Glucose-Capillary: 127 mg/dL — ABNORMAL HIGH (ref 70–99)
Glucose-Capillary: 114 mg/dL — ABNORMAL HIGH (ref 70–99)
Glucose-Capillary: 126 mg/dL — ABNORMAL HIGH (ref 70–99)
Glucose-Capillary: 113 mg/dL — ABNORMAL HIGH (ref 70–99)

## 2022-08-06 LAB — ECHO INTRAOPERATIVE TEE
AV Mean grad: 10 mmHg
AV Peak grad: 15.2 mmHg
Ao pk vel: 1.95 m/s
Height: 76 in
S' Lateral: 2.3 cm
Weight: 4761.6 oz

## 2022-08-06 MED ORDER — FE FUMARATE-B12-VIT C-FA-IFC PO CAPS: 1.0000 | ORAL_CAPSULE | Freq: Two times a day (BID) | ORAL | Status: DC

## 2022-08-06 MED ORDER — DIGOXIN 0.25 MG/ML IJ SOLN
0.2500 mg | Freq: Once | INTRAMUSCULAR | Status: AC
  Administered 2022-08-06: 0.25 mg via INTRAVENOUS
  Filled 2022-08-06: qty 2

## 2022-08-06 MED ORDER — AMIODARONE HCL IN DEXTROSE 360-4.14 MG/200ML-% IV SOLN
INTRAVENOUS | Status: AC
  Filled 2022-08-06: qty 200

## 2022-08-06 MED ORDER — FA-PYRIDOXINE-CYANOCOBALAMIN 2.5-25-2 MG PO TABS
1.0000 | ORAL_TABLET | Freq: Every day | ORAL | Status: DC
  Administered 2022-08-06 – 2022-08-18 (×13): 1 via ORAL
  Filled 2022-08-06 (×14): qty 1

## 2022-08-06 MED ORDER — AMIODARONE HCL IN DEXTROSE 360-4.14 MG/200ML-% IV SOLN
30.0000 mg/h | INTRAVENOUS | Status: DC
  Administered 2022-08-06 – 2022-08-09 (×5): 30 mg/h via INTRAVENOUS
  Filled 2022-08-06 (×7): qty 200

## 2022-08-06 MED ORDER — FERROUS FUMARATE 324 (106 FE) MG PO TABS
1.0000 | ORAL_TABLET | Freq: Every day | ORAL | Status: DC
  Administered 2022-08-06 – 2022-08-18 (×13): 106 mg via ORAL
  Filled 2022-08-06 (×15): qty 1

## 2022-08-06 MED ORDER — DIGOXIN 0.25 MG/ML IJ SOLN
0.5000 mg | Freq: Once | INTRAMUSCULAR | Status: AC
  Administered 2022-08-06: 0.5 mg via INTRAVENOUS
  Filled 2022-08-06: qty 2

## 2022-08-06 MED ORDER — DILTIAZEM HCL 60 MG PO TABS
60.0000 mg | ORAL_TABLET | Freq: Four times a day (QID) | ORAL | Status: DC
  Administered 2022-08-06 – 2022-08-07 (×3): 60 mg via ORAL
  Filled 2022-08-06 (×3): qty 1

## 2022-08-06 MED ORDER — AMIODARONE IV BOLUS ONLY 150 MG/100ML
150.0000 mg | Freq: Once | INTRAVENOUS | Status: AC
  Administered 2022-08-06: 150 mg via INTRAVENOUS

## 2022-08-06 MED ORDER — AMIODARONE HCL IN DEXTROSE 360-4.14 MG/200ML-% IV SOLN
60.0000 mg/h | INTRAVENOUS | Status: AC
  Administered 2022-08-06 (×2): 60 mg/h via INTRAVENOUS

## 2022-08-06 MED ORDER — AMIODARONE LOAD VIA INFUSION
150.0000 mg | Freq: Once | INTRAVENOUS | Status: AC
  Filled 2022-08-06: qty 83.34

## 2022-08-06 MED FILL — Thrombin (Recombinant) For Soln 20000 Unit: CUTANEOUS | Qty: 1 | Status: AC

## 2022-08-06 NOTE — Discharge Summary (Signed)
[address]       [place] [address]             [phone]    Physician Discharge Summary  Patient ID: Hector Edwards MRN: [id number] DOB/AGE: 01/16/1946 76 y.o.  Admit date: 08/01/2022 Discharge date: 08/12/2022  Admission Diagnoses:  Patient Active Problem List   Diagnosis Date Noted   Coronary artery disease involving native coronary artery of native heart with unstable angina pectoris ([place])    Class 2 severe obesity with serious comorbidity and body mass index (BMI) of 37.0 to 37.9 in adult, unspecified obesity type ([place]) 10/27/2020   OSA (obstructive sleep apnea) 08/01/2018   Benign prostatic hyperplasia with nocturia 11/26/2016   Prostate cancer screening 11/26/2016   Medicare annual wellness visit, subsequent 11/26/2016   Hx of adenomatous colonic polyps 09/04/2015   Annual physical exam 07/20/2015   Visit for preventive health examination 05/04/2013   Erectile dysfunction 05/04/2013   Snoring 02/13/2012   GERD (gastroesophageal reflux disease) 08/12/2011   Osteoarthritis 08/08/2011   Hyperlipidemia 10/13/2007   Essential hypertension 10/13/2007   Discharge Diagnoses:  Patient Active Problem List   Diagnosis Date Noted   Persistent atrial fibrillation ([place])    S/P CABG x 3 08/03/2022   Coronary artery disease involving native coronary artery of native heart with unstable angina pectoris ([facility])    Class 2 severe obesity with serious comorbidity and body mass index (BMI) of 37.0 to 37.9 in adult, unspecified obesity type ([place]) 10/27/2020   OSA (obstructive sleep apnea) 08/01/2018   Benign prostatic hyperplasia with nocturia 11/26/2016   Prostate cancer screening 11/26/2016   Medicare annual wellness visit, subsequent 11/26/2016   Hx of adenomatous colonic polyps 09/04/2015   Annual physical exam 07/20/2015   Visit for preventive health examination 05/04/2013   Erectile dysfunction 05/04/2013   Snoring 02/13/2012   GERD  (gastroesophageal reflux disease) 08/12/2011   Osteoarthritis 08/08/2011   Hyperlipidemia 10/13/2007   Essential hypertension 10/13/2007  -Superficial phlebitis left arm  Discharged Condition: stable  History of Present Illness:     Hector Edwards is a 76 year old male with a  past medical history of HTN, HLD, obesity, GERD, depression, sleep apnea (does not tolerate CPAP), CAD and mild aortic stenosis. He presented to the cardiologist on 06/13/22 complaining of worsening dyspnea on exertion and fatigue for about 1 year as well as occasional LE swelling. He was seen by Hector Edwards and underwent an echo in July 2017 which showed mild LVH and no valvular disease. Nuclear stress test was also done in 2017 and showed no ischemia. Echocardiogram on 06/25/19 showed LVEF 55-60% with mild aortic valve sclerosis but no stenosis at this time. Echocardiogram on 07/05/22 showed LVEF 60-65% and mild aortic stenosis with a mean gradient of 12. Coronary CTA on 07/24/22 showed severe disease in the LAD and RCA. He underwent a cardiac catheterization on 08/01/22 which revealed severe distal left main/three vessel CAD. Including severe, heavily calcified eccentric distal left main stenosis 80%, severe mid LAD stenosis 80%, severe ostial Circumflex stenosis 70%, and large dominant RCA with severe ostial stenosis 80%, severe mid to distal RCA stenosis 90%, RPAV 99% stenosis, RPDA 80% stenosis. The patient states overall for the past year he has been increasingly fatigued and short of breath with exertion but is still able to mow the lawn. He denies chest pain, orthopnea, and dizziness. He is currently resting comfortably in bed and denies dyspnea and chest pain. He  has  family history of heart disease and MI in his father and brother. He has no history of varicose veins. 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  Hector Edwards reviewed the patient's chart, labs and diagnostic studies and determined  surgical revascularization would provide the patient the best long term treatment. Hector Edwards reviewed Hector Edwards treatment options as well as the risks and benefits of surgery. Hector Edwards was agreeable to proceed with coronary artery bypass grafting surgery.   Hospital Course: The patient was an inpatient at [facility] and was brought to the operating room on 08/03/22. He underwent a CABG x 3 utilizing LIMA to LAD, RSVG to PDA and RSVG to PLA. He tolerated the procedure well and was transferred to the SICU in stable condition. He was extubated later the evening of surgery. He was weaned off the Neo Synephrine drip. He was volume overloaded. He was started on Lopressor. He was transitioned off the Insulin drip. His pre op HGA1C was 5.4. Accu checks and SS will be stopped after transfer. EPW were removed on 10/29.  He developed Atrial Fibrillation with RVR.  He was treated with IV Amiodarone bolus and drip protocol.  He responded well to diuretics with weight returning to baseline.  Due to this diuretics were discontinued.  He did not convert to NSR despite IV Amiodarone.  He was started on oral cardizem and digoxin to assist with rate control.  EP consult was obtained.   Despite medication regimen patient's Atrial fibrillation/flutter persisted.  He underwent cardioversion by EP on 08/08/2022.  He has maintained NSR since cardioversion.  He was transitioned off IV amiodarone to an oral regimen. He was stable for transfer to the progressive care unit on 08/09/2022.  He had a first degree heart block but remained in NSR. He remained on cardizem and beta blocker was held. CXR showed bilateral pleural effusions, left greater than right. He had minimal ambulation since surgery so a PT/OT consult was obtained and home health PT and OT were recommended and arranged We also requested a bedside commode as suggested. Hector Edwards developed erythema and induration at the IV site in his left arm.   This was likely  related to amiodarone infusion and was not causing him any significant discomfort.  This will be treated with oral cefdinir 336m BID for [redacted] week along with warm compresses for the next 5 days. He is instructed to call our office if the site is not improving over the next few days.    Consults: cardiology and EP  Significant Diagnostic Studies: angiography:     Mid RCA to Dist RCA lesion is 90% stenosed.   Ost RCA to Prox RCA lesion is 80% stenosed.   RPAV lesion is 99% stenosed.   RPDA lesion is 80% stenosed.   Mid LM to Dist LM lesion is 80% stenosed.   Ost Cx to Prox Cx lesion is 70% stenosed.   Dist LM to Ost LAD lesion is 80% stenosed.   Prox LAD to Mid LAD lesion is 60% stenosed.   Mid LAD lesion is 80% stenosed.   Severe distal left main/three vessel CAD Severe, heavily calcified eccentric distal left main stenosis Severe ostial LAD stenosis. Severe mid LAD stenosis Severe ostial Circumflex stenosis.  Large dominant RCA with severe ostial stenosis. Severe mid to distal vessel stenosis. Severe stenosis in the proximal posterolateral artery. Severe ostial PDA stenosis.  LVEDP 18 mmHg   Recommendations: Severe left main and  three vessel. Will admit to telemetry and consult CT surgery to review candidacy for bypass surgery. Will add statin, beta blocker and ASA.    Treatments: surgery:   08/03/2022   Surgeon:  Gaye Pollack, MD   First Assistant: Wynelle Beckmann PA-C and Enid Cutter PA-C:   An experienced assistant was required given the complexity of this surgery and the standard of surgical care. The assistant was needed for saphenous vein harvesting, exposure, dissection, suctioning, retraction of delicate tissues and sutures, instrument exchange and for overall help during this procedure.     Preoperative Diagnosis:  Severe left main and multi-vessel coronary artery disease     Postoperative Diagnosis:  Same     Procedure:   Median Sternotomy Extracorporeal  circulation 3.   Coronary artery bypass grafting x 3   Left internal mammary artery graft to the LAD SVG to PDA SVG to PL (RCA)   4.   Endoscopic vein harvest from the right leg     Discharge Exam: Blood pressure (!) 144/66, pulse 88, temperature 98.8 F (37.1 C), temperature source Oral, resp. rate 20, height 6' 4" (1.93 m), weight 134.1 kg, SpO2 95 %.  General appearance: alert, cooperative, and no distress Neurologic: intact Heart: regular rate and rhythm, a-fib earlier this morning, back in SR. Lungs: Breath sounds are clear, no dyspnea on RA Abdomen: soft, non-tender Extremities:  no LE edema Wound: Clean, dry, intact   Discharge Medications:  The patient has been discharged on:   1.Beta Blocker:  Yes [ x  ]                              No   [   ]                              If No, reason:  2.Ace Inhibitor/ARB: Yes [  x ]                                     No  [    ]                                     If No, reason:  3.Statin:   Yes [ X  ]                  No  [   ]                  If No, reason:  4.Ecasa:  Yes  Valu.Nieves   ]                  No   [   ]                  If No, reason:  Patient had ACS upon admission: No  Plavix/P2Y12 inhibitor: Yes [   ]                                      No  [ x ]     Discharge Instructions     Amb Referral to Cardiac  Rehabilitation   Complete by: As directed    [place] [facility] referral   Diagnosis: CABG   CABG X ___: 3   After initial evaluation and assessments completed: Virtual Based Care may be provided alone or in conjunction with Phase 2 Cardiac Rehab based on patient barriers.: Yes   Intensive Cardiac Rehabilitation (ICR) [place] location only OR Traditional Cardiac Rehabilitation (TCR) *If criteria for ICR are not met will enroll in TCR [facility] only): Yes      Allergies as of 08/12/2022       Reactions   Hctz [hydrochlorothiazide] Shortness Of Breath   Enalapril Maleate    cramps   Tribenzor  [olmesartan-amlodipine-hctz] Swelling   Fatigue, CP, edema   Verapamil    Fatigue        Medication List     TAKE these medications    amiodarone 400 MG tablet Commonly known as: PACERONE Take 1 tablet (400 mg total) by mouth 2 (two) times daily. For 10 days the the dose will be reduced to 232m by mouth once  daily.   amiodarone 200 MG tablet Commonly known as: PACERONE Take 1 tablet (200 mg total) by mouth daily. Start taking on: August 23, 2022   apixaban 5 MG Tabs tablet Commonly known as: ELIQUIS Take 1 tablet (5 mg total) by mouth 2 (two) times daily.   aspirin EC 81 MG tablet Take 1 tablet (81 mg total) by mouth daily. Swallow whole.   atorvastatin 80 MG tablet Commonly known as: LIPITOR Take 1 tablet (80 mg total) by mouth daily.   benazepril 20 MG tablet Commonly known as: LOTENSIN Take 1 tablet (20 mg total) by mouth at bedtime. What changed: See the new instructions.   cefdinir 300 MG capsule Commonly known as: OMNICEF Take 1 capsule (300 mg total) by mouth 2 (two) times daily.   diltiazem 360 MG 24 hr capsule Commonly known as: CARDIZEM CD Take 1 capsule (360 mg total) by mouth daily.   pantoprazole 40 MG tablet Commonly known as: PROTONIX TAKE ONE TABLET BY MOUTH EVERY EVENING   traMADol 50 MG tablet Commonly known as: ULTRAM Take 1 tablet (50 mg total) by mouth every 6 (six) hours as needed for up to 7 days for moderate pain.               Durable Medical Equipment  (From admission, onward)           Start     Ordered   08/11/22 2054  For home use only DME 3 n 1  Once        08/11/22 2053            Follow-up Information     [facility] Follow up on 09/05/2022.   Why: Appointment is at 9:30 for chest x ray Contact information: [address]       BGaye Pollack MD Follow up on 09/05/2022.   Specialty: Cardiothoracic Surgery Why: Appointment is at  10:30 Contact information: [id number] up on 08/22/2022.   Specialty: Cardiology Why: AFIB clinic appointment at 1:30PM Contact information: [phone]        SEmmaline Life NP Follow up on 08/23/2022.   Specialty: Nurse Practitioner Why: Cardiology appointment at 8:25AM Contact information: [address]  [address] [place] [address] [phone]                 Signed:  Antony Odea, PA-C  08/12/2022, 9:01 AM

## 2022-08-06 NOTE — Progress Notes (Signed)
Ambulation deferred at this time due to HR 130s a-fib.

## 2022-08-06 NOTE — Progress Notes (Signed)
  Amiodarone Drug - Drug Interaction Consult Note  Recommendations: Monitor HR and signs of myopathy.  Amiodarone is metabolized by the cytochrome P450 system and therefore has the potential to cause many drug interactions. Amiodarone has an average plasma half-life of 50 days (range 20 to 100 days).   There is potential for drug interactions to occur several weeks or months after stopping treatment and the onset of drug interactions may be slow after initiating amiodarone.   '[x]'$  Statins: Increased risk of myopathy. Simvastatin- restrict dose to '20mg'$  daily. Other statins: counsel patients to report any muscle pain or weakness immediately.  '[]'$  Anticoagulants: Amiodarone can increase anticoagulant effect. Consider warfarin dose reduction. Patients should be monitored closely and the dose of anticoagulant altered accordingly, remembering that amiodarone levels take several weeks to stabilize.  '[]'$  Antiepileptics: Amiodarone can increase plasma concentration of phenytoin, the dose should be reduced. Note that small changes in phenytoin dose can result in large changes in levels. Monitor patient and counsel on signs of toxicity.  '[x]'$  Beta blockers: increased risk of bradycardia, AV block and myocardial depression. Sotalol - avoid concomitant use.  '[]'$   Calcium channel blockers (diltiazem and verapamil): increased risk of bradycardia, AV block and myocardial depression.  '[]'$   Cyclosporine: Amiodarone increases levels of cyclosporine. Reduced dose of cyclosporine is recommended.  '[]'$  Digoxin dose should be halved when amiodarone is started.  '[]'$  Diuretics: increased risk of cardiotoxicity if hypokalemia occurs.  '[]'$  Oral hypoglycemic agents (glyburide, glipizide, glimepiride): increased risk of hypoglycemia. Patient's glucose levels should be monitored closely when initiating amiodarone therapy.   '[]'$  Drugs that prolong the QT interval:  Torsades de pointes risk may be increased with concurrent use -  avoid if possible.  Monitor QTc, also keep magnesium/potassium WNL if concurrent therapy can't be avoided.  Antibiotics: e.g. fluoroquinolones, erythromycin.  Antiarrhythmics: e.g. quinidine, procainamide, disopyramide, sotalol.  Antipsychotics: e.g. phenothiazines, haloperidol.   Lithium, tricyclic antidepressants, and methadone.   Wynona Neat, PharmD, BCPS 08/06/2022 7:13 AM

## 2022-08-06 NOTE — Progress Notes (Signed)
3 Days Post-Op Procedure(s) (LRB): CORONARY ARTERY BYPASS GRAFTING (CABG) 3X, USING RIGHT GREATER SAPHENOUS VEIN AND LEFT INTERNAL MAMMARY ARTERY. (N/A) TRANSESOPHAGEAL ECHOCARDIOGRAM (TEE) (N/A) Subjective: Uncomfortable in the chair due to his size. Ambulated well yesterday.  Went into atrial fib with RVR this am. Started on amio IV. Still 130's.  Nurse reports that he is not following sternal precautions.  Objective: Vital signs in last 24 hours: Temp:  [97.6 F (36.4 C)-98.4 F (36.9 C)] 97.8 F (36.6 C) (10/30 0300) Pulse Rate:  [44-146] 105 (10/30 0645) Cardiac Rhythm: Atrial fibrillation (10/30 0550) Resp:  [10-33] 24 (10/30 0645) BP: (101-148)/(55-114) 146/70 (10/30 0645) SpO2:  [91 %-99 %] 98 % (10/30 0645) Weight:  [637 kg] 136 kg (10/30 0500)  Hemodynamic parameters for last 24 hours:    Intake/Output from previous day: 10/29 0701 - 10/30 0700 In: 285.4 [I.V.:267.1; IV Piggyback:18.4] Out: 2175 [Urine:2175] Intake/Output this shift: No intake/output data recorded.  General appearance: alert and cooperative Neurologic: intact Heart: irregularly irregular rhythm Lungs: clear to auscultation bilaterally Extremities: extremities normal, atraumatic, no cyanosis or edema Wound: incision ok  Lab Results: Recent Labs    08/05/22 0327 08/06/22 0447  WBC 20.5* 14.4*  HGB 8.9* 8.7*  HCT 27.2* 25.5*  PLT 124* 146*   BMET:  Recent Labs    08/05/22 0327 08/06/22 0447  NA 134* 136  K 4.3 3.8  CL 101 104  CO2 25 25  GLUCOSE 139* 123*  BUN 20 19  CREATININE 0.94 0.86  CALCIUM 8.3* 8.4*    PT/INR:  Recent Labs    08/03/22 1439  LABPROT 15.7*  INR 1.3*   ABG    Component Value Date/Time   PHART 7.342 (L) 08/03/2022 2048   HCO3 19.6 (L) 08/03/2022 2048   TCO2 21 (L) 08/03/2022 2048   ACIDBASEDEF 5.0 (H) 08/03/2022 2048   O2SAT 97 08/03/2022 2048   CBG (last 3)  Recent Labs    08/05/22 1952 08/05/22 2311 08/06/22 0322  GLUCAP 127* 126* 123*     Assessment/Plan: S/P Procedure(s) (LRB): CORONARY ARTERY BYPASS GRAFTING (CABG) 3X, USING RIGHT GREATER SAPHENOUS VEIN AND LEFT INTERNAL MAMMARY ARTERY. (N/A) TRANSESOPHAGEAL ECHOCARDIOGRAM (TEE) (N/A)  POD 3 Hemodynamically stable.  Postop atrial fibrillation. Started on IV amio this am. Will continue for now with intermittent boluses as needed and Lopressor.  Glucose under good control. Normal Hgb A1c on no meds. DC CBG's  Wt back to baseline. Stop diuresis.  Expected acute postop blood loss anemia: start iron.  IS, OOB.   LOS: 5 days    Gaye Pollack 08/06/2022

## 2022-08-06 NOTE — Discharge Instructions (Addendum)
Information on my medicine - ELIQUIS (apixaban)  This medication education was reviewed with me or my healthcare representative as part of my discharge preparation.   Why was Eliquis prescribed for you? Eliquis was prescribed for you to reduce the risk of a blood clot forming that can cause a stroke if you have a medical condition called atrial fibrillation (a type of irregular heartbeat).  What do You need to know about Eliquis ? Take your Eliquis TWICE DAILY - one tablet in the morning and one tablet in the evening with or without food. If you have difficulty swallowing the tablet whole please discuss with your pharmacist how to take the medication safely.  Take Eliquis exactly as prescribed by your doctor and DO NOT stop taking Eliquis without talking to the doctor who prescribed the medication.  Stopping may increase your risk of developing a stroke.  Refill your prescription before you run out.  After discharge, you should have regular check-up appointments with your healthcare provider that is prescribing your Eliquis.  In the future your dose may need to be changed if your kidney function or weight changes by a significant amount or as you get older.  What do you do if you miss a dose? If you miss a dose, take it as soon as you remember on the same day and resume taking twice daily.  Do not take more than one dose of ELIQUIS at the same time to make up a missed dose.  Important Safety Information A possible side effect of Eliquis is bleeding. You should call your healthcare provider right away if you experience any of the following: Bleeding from an injury or your nose that does not stop. Unusual colored urine (red or dark brown) or unusual colored stools (red or black). Unusual bruising for unknown reasons. A serious fall or if you hit your head (even if there is no bleeding).  Some medicines may interact with Eliquis and might increase your risk of bleeding or clotting  while on Eliquis. To help avoid this, consult your healthcare provider or pharmacist prior to using any new prescription or non-prescription medications, including herbals, vitamins, non-steroidal anti-inflammatory drugs (NSAIDs) and supplements.  This website has more information on Eliquis (apixaban): http://www.eliquis.com/eliquis/home    Discharge Instructions:  1. You may shower, please wash incisions daily with soap and water and keep dry.  If you wish to cover wounds with dressing you may do so but please keep clean and change daily.  No tub baths or swimming until incisions have completely healed.  If your incisions become red or develop any drainage please call our office at [phone]  2. No Driving until cleared by Dr. Vivi Martens office and you are no longer using narcotic pain medications  3. Monitor your weight daily.. Please use the same scale and weigh at same time... If you gain 5-10 lbs in 48 hours with associated lower extremity swelling, please contact our office at [phone]  4. Fever of 101.5 for at least 24 hours with no source, please contact our office at [phone]  5. Activity- up as tolerated, please walk at least 3 times per day.  Avoid strenuous activity, no lifting, pushing, or pulling with your arms over 8-10 lbs for a minimum of 6 weeks  6. If any questions or concerns arise, please do not hesitate to contact our office at [phone]  7. Apply a warm compress to the left arm IV site 3 times daily for 5-7 days. Please call our office  for an appointment if redness and irritation are not improving over the next 3 days.

## 2022-08-06 NOTE — Progress Notes (Signed)
0545  Pt HR >154, PRN lopressor given. DR. Lavonna Monarch updated, see new orders for updates.  0550  Amio bolus given; protocol started.

## 2022-08-06 NOTE — Progress Notes (Signed)
EVENING ROUNDS NOTE :     [address]       [place] [address]             [phone]                 3 Days Post-Op Procedure(s) (LRB): CORONARY ARTERY BYPASS GRAFTING (CABG) 3X, USING RIGHT GREATER SAPHENOUS VEIN AND LEFT INTERNAL MAMMARY ARTERY. (N/A) TRANSESOPHAGEAL ECHOCARDIOGRAM (TEE) (N/A)   Total Length of Stay:  LOS: 5 days  Events:   Afib in 120s Asympt On amio gtt.  Will continue    BP (!) 128/54   Pulse 62   Temp 98.2 F (36.8 C) (Oral)   Resp (!) 29   Ht '6\' 4"'$  (1.93 m)   Wt 136 kg   SpO2 96%   BMI 36.50 kg/m          sodium chloride Stopped (08/04/22 1533)   amiodarone 30 mg/hr (08/06/22 1259)    I/O last 3 completed shifts: In: 736.1 [I.V.:536.1; IV Piggyback:200.1] Out: 2675 [Urine:2675]      Latest Ref Rng & Units 08/06/2022    4:47 AM 08/05/2022    3:27 AM 08/04/2022    4:53 PM  CBC  WBC 4.0 - 10.5 K/uL 14.4  20.5  27.2   Hemoglobin 13.0 - 17.0 g/dL 8.7  8.9  10.0   Hematocrit 39.0 - 52.0 % 25.5  27.2  29.2   Platelets 150 - 400 K/uL 146  124  158        Latest Ref Rng & Units 08/06/2022    4:47 AM 08/05/2022    3:27 AM 08/04/2022    4:53 PM  BMP  Glucose 70 - 99 mg/dL 123  139  168   BUN 8 - 23 mg/dL '19  20  19   '$ Creatinine 0.61 - 1.24 mg/dL 0.86  0.94  1.06   Sodium 135 - 145 mmol/L 136  134  134   Potassium 3.5 - 5.1 mmol/L 3.8  4.3  4.1   Chloride 98 - 111 mmol/L 104  101  104   CO2 22 - 32 mmol/L '25  25  21   '$ Calcium 8.9 - 10.3 mg/dL 8.4  8.3  8.0     ABG    Component Value Date/Time   PHART 7.342 (L) 08/03/2022 2048   PCO2ART 36.6 08/03/2022 2048   PO2ART 96 08/03/2022 2048   HCO3 19.6 (L) 08/03/2022 2048   TCO2 21 (L) 08/03/2022 2048   ACIDBASEDEF 5.0 (H) 08/03/2022 2048   O2SAT 97 08/03/2022 2048       Melodie Bouillon, MD 08/06/2022 4:19 PM

## 2022-08-06 NOTE — Progress Notes (Signed)
Patient ID: Hector Edwards, male   DOB: Mar 22, 1946, 76 y.o.   MRN: [id number] TCTS  He has remained in atrial flutter I think all day in the 120's-130. He has been on IV amio all day with no response to boluses, no response to IV Lopressor. Will DC Lopressor and start po cardizem and load with digoxin for better rate control. Continue IV amio for now.

## 2022-08-07 ENCOUNTER — Other Ambulatory Visit: Payer: Self-pay

## 2022-08-07 ENCOUNTER — Telehealth (HOSPITAL_COMMUNITY): Payer: Self-pay

## 2022-08-07 ENCOUNTER — Other Ambulatory Visit (HOSPITAL_COMMUNITY): Payer: Self-pay

## 2022-08-07 ENCOUNTER — Encounter (HOSPITAL_COMMUNITY): Payer: Self-pay | Admitting: Surgery

## 2022-08-07 DIAGNOSIS — Z951 Presence of aortocoronary bypass graft: Secondary | ICD-10-CM | POA: Diagnosis not present

## 2022-08-07 DIAGNOSIS — I4891 Unspecified atrial fibrillation: Secondary | ICD-10-CM

## 2022-08-07 DIAGNOSIS — I2510 Atherosclerotic heart disease of native coronary artery without angina pectoris: Secondary | ICD-10-CM

## 2022-08-07 LAB — CBC
HCT: 28 % — ABNORMAL LOW (ref 39.0–52.0)
Hemoglobin: 9.6 g/dL — ABNORMAL LOW (ref 13.0–17.0)
MCH: 33.8 pg (ref 26.0–34.0)
MCHC: 34.3 g/dL (ref 30.0–36.0)
MCV: 98.6 fL (ref 80.0–100.0)
Platelets: 245 10*3/uL (ref 150–400)
RBC: 2.84 MIL/uL — ABNORMAL LOW (ref 4.22–5.81)
RDW: 14.4 % (ref 11.5–15.5)
WBC: 14.5 10*3/uL — ABNORMAL HIGH (ref 4.0–10.5)
nRBC: 0 % (ref 0.0–0.2)

## 2022-08-07 LAB — MAGNESIUM: Magnesium: 2.2 mg/dL (ref 1.7–2.4)

## 2022-08-07 LAB — LIPID PANEL
Cholesterol: 88 mg/dL (ref 0–200)
HDL: 16 mg/dL — ABNORMAL LOW (ref >40)
LDL Cholesterol: 51 mg/dL (ref 0–99)
Total CHOL/HDL Ratio: 5.5 RATIO
Triglycerides: 107 mg/dL (ref <150)
VLDL: 21 mg/dL (ref 0–40)

## 2022-08-07 LAB — GLUCOSE, CAPILLARY
Glucose-Capillary: 187 mg/dL — ABNORMAL HIGH (ref 70–99)
Glucose-Capillary: 113 mg/dL — ABNORMAL HIGH (ref 70–99)

## 2022-08-07 LAB — BASIC METABOLIC PANEL
Anion gap: 12 (ref 5–15)
BUN: 23 mg/dL (ref 8–23)
CO2: 23 mmol/L (ref 22–32)
Calcium: 8.7 mg/dL — ABNORMAL LOW (ref 8.9–10.3)
Chloride: 102 mmol/L (ref 98–111)
Creatinine, Ser: 0.82 mg/dL (ref 0.61–1.24)
GFR, Estimated: >60 mL/min (ref >60)
Glucose, Bld: 125 mg/dL — ABNORMAL HIGH (ref 70–99)
Potassium: 3.9 mmol/L (ref 3.5–5.1)
Sodium: 137 mmol/L (ref 135–145)

## 2022-08-07 MED ORDER — ASPIRIN 81 MG PO CHEW: 81.0000 mg | CHEWABLE_TABLET | Freq: Every day | ORAL | Status: DC

## 2022-08-07 MED ORDER — ORAL CARE MOUTH RINSE: 15.0000 mL | OROMUCOSAL | Status: DC | PRN

## 2022-08-07 MED ORDER — DIGOXIN 0.25 MG/ML IJ SOLN
0.2500 mg | Freq: Once | INTRAMUSCULAR | Status: AC
  Administered 2022-08-07: 0.25 mg via INTRAVENOUS
  Filled 2022-08-07: qty 2

## 2022-08-07 MED ORDER — APIXABAN 5 MG PO TABS
5.0000 mg | ORAL_TABLET | Freq: Two times a day (BID) | ORAL | Status: DC
  Administered 2022-08-07 – 2022-08-18 (×23): 5 mg via ORAL
  Filled 2022-08-07 (×23): qty 1

## 2022-08-07 MED ORDER — ASPIRIN 81 MG PO TBEC
81.0000 mg | DELAYED_RELEASE_TABLET | Freq: Every day | ORAL | Status: DC
  Administered 2022-08-07 – 2022-08-09 (×3): 81 mg via ORAL
  Filled 2022-08-07 (×3): qty 1

## 2022-08-07 MED ORDER — POTASSIUM CHLORIDE CRYS ER 20 MEQ PO TBCR
20.0000 meq | EXTENDED_RELEASE_TABLET | Freq: Once | ORAL | Status: AC
  Administered 2022-08-07: 20 meq via ORAL
  Filled 2022-08-07: qty 1

## 2022-08-07 MED ORDER — DILTIAZEM HCL 60 MG PO TABS
90.0000 mg | ORAL_TABLET | Freq: Four times a day (QID) | ORAL | Status: DC
  Administered 2022-08-07 – 2022-08-10 (×12): 90 mg via ORAL
  Filled 2022-08-07 (×12): qty 2

## 2022-08-07 NOTE — Progress Notes (Signed)
  Pt tentatively for TEE/DCC; awaiting confirmation that Dr. Cyndia Bent agrees.   After careful review of history and examination, the risks and benefits of transesophageal echocardiogram have been explained including risks of esophageal damage, perforation (1:10,000 risk), bleeding, pharyngeal hematoma as well as other potential complications associated with conscious sedation including aspiration, arrhythmia, respiratory failure and death. Alternatives to treatment were discussed, questions were answered. Patient is willing to proceed.   Shirley Friar, PA-C 08/07/2022 1:00 PM

## 2022-08-07 NOTE — Telephone Encounter (Signed)
Pharmacy Patient Advocate Encounter  Insurance verification completed.    The patient is insured through [employer]   The patient is currently admitted and ran test claims for the following: Eliquis.  Copays and coinsurance results were relayed to Inpatient clinical team.  

## 2022-08-07 NOTE — Consult Note (Addendum)
ELECTROPHYSIOLOGY CONSULT NOTE    Patient ID: Hector Edwards MRN: [id number], DOB/AGE: 1945/11/29 76 y.o.  Admit date: 08/01/2022 Date of Consult: 08/07/2022  Primary Physician: [name], [place] Primary Cardiologist: Lauree Chandler, MD  Electrophysiologist: New   Referring Provider: Dr. Cyndia Bent  Patient Profile: Hector Edwards is a 76 y.o. male with a history of depression, GERD, obesity, HTN, HLD, sleep apnea and recently diagnosed CAD and mild aortic stenosis who is being seen today for the evaluation of Post op AFL at the request of Dr. Cyndia Bent.  HPI:  Hector Edwards is a 76 y.o. male with medical history as above.   Initially seen as new consult by Dr. Angelena Form 06/2022. Had previously seen Dr. Irish Lack with normal Echo and stres test without ischemia 2017.  At his visit here in September 2023 he described progressive dyspnea and fatigue but no chest pain. Rare LE edema. He has sleep apnea but does not tolerate CPAP. Echo 07/05/22 with LVEF=60-65%, moderate LVH. Mild aortic stenosis with mean gradient of 12 mmHg. Coronary CTA 07/24/22 with severe disease in the LAD and RCA that appears to be flow limiting by CT FFR. Cardiac cath today with severe distal left main stenosis, severe disease in the ostial LAD and mid LAD, ostial Circumflex and in the proximal/distal RCA, PLA and PDA  Potassium3.9 (10/31 0115) Magnesium  2.2 (10/31 0115) Creatinine, ser  0.82 (10/31 0115) PLT  146* (10/30 0447) HGB  8.7* (10/30 0447) WBC 14.4* (10/30 0447)  Currently, he is very fatigued at rest, not sure if related to HRs or not.  He does feel like he has been "running".   Denies dyspnea at rest. Remains very sore with coughing.   Past Medical History:  Diagnosis Date   Depression    GERD (gastroesophageal reflux disease)    Hx of adenomatous colonic polyps 09/04/2015   Hyperlipidemia    Hypertension    LBP (low back pain)    OSA (obstructive sleep apnea) 08/01/2018    Sleep apnea    wears cpap      Surgical History:  Past Surgical History:  Procedure Laterality Date   COLONOSCOPY  2004, 2021   hyperplastic polyps x 2   KNEE ARTHROSCOPY Right    LEFT HEART CATH AND CORONARY ANGIOGRAPHY N/A 08/01/2022   Procedure: LEFT HEART CATH AND CORONARY ANGIOGRAPHY;  Surgeon: Burnell Blanks, MD;  Location: [facility];  Service: Cardiovascular;  Laterality: N/A;   PENILE PROSTHESIS IMPLANT N/A 01/25/2020   Procedure: PENILE PROTHESIS INFLATABLE COLOPLAST;  Surgeon: Lucas Mallow, MD;  Location: [facility];  Service: Urology;  Laterality: N/A;   SHOULDER ARTHROSCOPY W/ ROTATOR CUFF REPAIR Left    TONSILLECTOMY  as child   vocal cord growth removed  age 28   benign   WISDOM TOOTH EXTRACTION       Medications Prior to Admission  Medication Sig Dispense Refill Last Dose   benazepril (LOTENSIN) 20 MG tablet TAKE TWO TABLETS BY MOUTH EVERYDAY AT BEDTIME 60 tablet 0 07/31/2022   pantoprazole (PROTONIX) 40 MG tablet TAKE ONE TABLET BY MOUTH EVERY EVENING 30 tablet 0 07/31/2022    Inpatient Medications:   acetaminophen  1,000 mg Oral Q6H   Or   acetaminophen (TYLENOL) oral liquid 160 mg/5 mL  1,000 mg Per Tube Q6H   aspirin EC  81 mg Oral Daily   Or   aspirin  81 mg Per Tube Daily   atorvastatin  80  mg Oral Daily   bisacodyl  10 mg Oral Daily   Or   bisacodyl  10 mg Rectal Daily   Chlorhexidine Gluconate Cloth  6 each Topical Q0600   diltiazem  90 mg Oral Q6H   docusate sodium  200 mg Oral Daily   Ferrous Fumarate  1 tablet Oral Daily   And   folic acid-pyridoxine-cyancobalamin  1 tablet Oral Daily   pantoprazole  40 mg Oral Daily   sodium chloride flush  3 mL Intravenous Q12H    Allergies:  Allergies  Allergen Reactions   Hctz [Hydrochlorothiazide] Shortness Of Breath   Enalapril Maleate     cramps   Tribenzor [Olmesartan-Amlodipine-Hctz] Swelling    Fatigue, CP, edema   Verapamil     Fatigue    Social  History   Socioeconomic History   Marital status: Divorced    Spouse name: Not on file   Number of children: 1   Years of education: Not on file   Highest education level: Not on file  Occupational History   Occupation: Retired   Occupation: Retired-mailman  Tobacco Use   Smoking status: Never   Smokeless tobacco: Never  Vaping Use   Vaping Use: Never used  Substance and Sexual Activity   Alcohol use: Yes    Alcohol/week: 4.0 standard drinks of alcohol    Types: 4 Cans of beer per week    Comment: 4 cans/week   Drug use: No   Sexual activity: Not Currently  Other Topics Concern   Not on file  Social History Narrative   Regular exercise- yes, seldom; working physically all the time.   Social Determinants of Health   Financial Resource Strain: Low Risk  (03/06/2022)   Overall Financial Resource Strain (CARDIA)    Difficulty of Paying Living Expenses: Not hard at all  Food Insecurity: No Food Insecurity (08/07/2022)   Hunger Vital Sign    Worried About Running Out of Food in the Last Year: Never true    Ran Out of Food in the Last Year: Never true  Transportation Needs: No Transportation Needs (08/07/2022)   PRAPARE - Hydrologist (Medical): No    Lack of Transportation (Non-Medical): No  Physical Activity: Inactive (03/06/2022)   Exercise Vital Sign    Days of Exercise per Week: 0 days    Minutes of Exercise per Session: 0 min  Stress: No Stress Concern Present (03/06/2022)   [place]    Feeling of Stress : Not at all  Social Connections: Moderately Integrated (03/06/2022)   Social Connection and Isolation Panel [NHANES]    Frequency of Communication with Friends and Family: More than three times a week    Frequency of Social Gatherings with Friends and Family: More than three times a week    Attends Religious Services: 1 to 4 times per year    Active Member of [employer] or  Organizations: Yes    Attends Archivist Meetings: 1 to 4 times per year    Marital Status: Divorced  Human resources officer Violence: Not At Risk (08/07/2022)   Humiliation, Afraid, Rape, and Kick questionnaire    Fear of Current or Ex-Partner: No    Emotionally Abused: No    Physically Abused: No    Sexually Abused: No     Family History  Problem Relation Age of Onset   Stroke Mother 69   Depression Mother    Hypertension Father  Heart disease Father    Colon polyps Father    Pancreatic cancer Brother    Colon polyps Brother    Throat cancer Brother    Breast cancer Daughter    Hypertension Other    Colon cancer Neg Hx    Esophageal cancer Neg Hx    Stomach cancer Neg Hx    Rectal cancer Neg Hx      Review of Systems: All other systems reviewed and are otherwise negative except as noted above.  Physical Exam: Vitals:   08/07/22 0800 08/07/22 0830 08/07/22 0900 08/07/22 0930  BP: (!) 148/64 137/67 (!) 147/58 (!) 152/52  Pulse: (!) 127 (!) 119 79 (!) 138  Resp: (!) 23 (!) 32 (!) 24 (!) 22  Temp:      TempSrc:      SpO2: 94% 95% 93% 94%  Weight:      Height:        GEN- The patient is well appearing, alert and oriented x 3 today.   HEENT: normocephalic, atraumatic; sclera clear, conjunctiva pink; hearing intact; oropharynx clear; neck supple Lungs- Clear to ausculation bilaterally, normal work of breathing.  No wheezes, rales, rhonchi Heart- Regular rate and rhythm, no murmurs, rubs or gallops GI- soft, non-tender, non-distended, bowel sounds present Extremities- no clubbing, cyanosis, or edema; DP/PT/radial pulses 2+ bilaterally MS- no significant deformity or atrophy Skin- warm and dry, no rash or lesion Psych- euthymic mood, full affect Neuro- strength and sensation are intact  Labs:   Lab Results  Component Value Date   WBC 14.4 (H) 08/06/2022   HGB 8.7 (L) 08/06/2022   HCT 25.5 (L) 08/06/2022   MCV 98.5 08/06/2022   PLT 146 (L) 08/06/2022     Recent Labs  Lab 08/07/22 0115  NA 137  K 3.9  CL 102  CO2 23  BUN 23  CREATININE 0.82  CALCIUM 8.7*  GLUCOSE 125*      Radiology/Studies: ECHO INTRAOPERATIVE TEE  Result Date: 08/06/2022  *INTRAOPERATIVE TRANSESOPHAGEAL REPORT *  Patient Name:   Hector Edwards Date of Exam: 08/03/2022 Medical Rec #:  [id number]        Height:       76.0 in Accession #:    [id number]       Weight:       297.6 lb Date of Birth:  10/27/1945       BSA:          2.62 m Patient Age:    89 years         BP:           191/80 mmHg Patient Gender: M                HR:           80 bpm. Exam Location:  Anesthesiology Transesophogeal exam was perform intraoperatively during surgical procedure. Patient was closely monitored under general anesthesia during the entirety of examination. Indications:     Coronary Artery Disease Sonographer:     Darlina Sicilian RDCS Performing Phys: [id number] Diagnosing Phys: Suzette Battiest MD Complications: No known complications during this procedure. POST-OP IMPRESSIONS _ Left Ventricle: The left ventricle is unchanged from pre-bypass. _ Right Ventricle: The right ventricle appears unchanged from pre-bypass. _ Aorta: The aorta appears unchanged from pre-bypass. _ Left Atrium: The left atrium appears unchanged from pre-bypass. _ Left Atrial Appendage: The left atrial appendage appears unchanged from pre-bypass. _ Aortic Valve: The aortic valve appears unchanged from  pre-bypass. _ Mitral Valve: The mitral valve appears unchanged from pre-bypass. _ Tricuspid Valve: The tricuspid valve appears unchanged from pre-bypass. _ Pulmonic Valve: The pulmonic valve appears unchanged from pre-bypass. _ Interatrial Septum: The interatrial septum appears unchanged from pre-bypass. _ Interventricular Septum: The interventricular septum appears unchanged from pre-bypass. _ Pericardium: The pericardium appears unchanged from pre-bypass. PRE-OP FINDINGS  Left Ventricle: The left ventricle has normal  systolic function, with an ejection fraction of 60-65%. The cavity size was normal. There is severe concentric left ventricular hypertrophy. Right Ventricle: The right ventricle has normal systolic function. The cavity was normal. There is no increase in right ventricular wall thickness. Left Atrium: Left atrial size was normal in size. No left atrial/left atrial appendage thrombus was detected. Right Atrium: Right atrial size was normal in size. Interatrial Septum: No atrial level shunt detected by color flow Doppler. Pericardium: There is no evidence of pericardial effusion. Mitral Valve: The mitral valve is normal in structure. Mitral valve regurgitation is not visualized by color flow Doppler. Tricuspid Valve: The tricuspid valve was normal in structure. Tricuspid valve regurgitation is trivial by color flow Doppler. Aortic Valve: The aortic valve is tricuspid Aortic valve regurgitation is trivial by color flow Doppler. There is mild stenosis of the aortic valve. There is mild thickening and moderate calcification present on the aortic valve non-coronary, left coronary and right coronary cusps with mildly decreased mobility. Pulmonic Valve: The pulmonic valve was normal in structure. Pulmonic valve regurgitation is trivial by color flow Doppler. Aorta: The aortic root, ascending aorta and aortic arch are normal in size and structure. There is evidence of plaque in the descending aorta. +--------------+--------++ LEFT VENTRICLE         +--------------+--------++ PLAX 2D                +--------------+--------++ LVIDd:        3.50 cm  +--------------+--------++ LVIDs:        2.30 cm  +--------------+--------++ LVOT diam:    2.00 cm  +--------------+--------++ LV SV:        33 ml    +--------------+--------++ LV SV Index:  11.98    +--------------+--------++ LVOT Area:    3.14 cm +--------------+--------++                        +--------------+--------++  +-------------+------------++ AORTIC VALVE              +-------------+------------++ AV Vmax:     195.00 cm/s  +-------------+------------++ AV Vmean:    151.000 cm/s +-------------+------------++ AV VTI:      0.432 m      +-------------+------------++ AV Peak Grad:15.2 mmHg    +-------------+------------++ AV Mean Grad:10.0 mmHg    +-------------+------------++  +-------------+-------++ AORTA                +-------------+-------++ Ao Root diam:3.40 cm +-------------+-------++ Ao Asc diam: 3.10 cm +-------------+-------++  +--------------+-------+ SHUNTS                +--------------+-------+ Systemic Diam:2.00 cm +--------------+-------+  Suzette Battiest MD Electronically signed by Suzette Battiest MD Signature Date/Time: 08/06/2022/7:54:52 AM    Final    DG CHEST PORT 1 VIEW  Result Date: 08/04/2022 CLINICAL DATA:  Status post CABG. EXAM: PORTABLE CHEST 1 VIEW COMPARISON:  Radiograph earlier today. FINDINGS: Median sternotomy. Right Swan-Ganz catheter remains in place with tip in the region of the main pulmonary outflow tract. Left-sided chest tube and multiple mediastinal drains. Stable cardiomediastinal contours. Small left  pleural effusion persists. Ill-defined bibasilar atelectasis. No convincing pneumothorax. IMPRESSION: 1. Postoperative support apparatus, as described. 2. Persistent small left pleural effusion and bibasilar atelectasis. Electronically Signed   By: Keith Rake M.D.   On: 08/04/2022 10:10   DG Chest Port 1 View  Result Date: 08/04/2022 CLINICAL DATA:  Status post CABG EXAM: PORTABLE CHEST 1 VIEW COMPARISON:  August 03, 2022 FINDINGS: The ET and NG tubes have been removed. The mediastinal drain, left chest tube, and PA catheter stable. No pneumothorax. Mild hazy opacity in left base is likely a small effusion with underlying atelectasis, mildly more pronounced. No other pulmonary opacities are noted. No overt edema. Stable  cardiomegaly. The hila and mediastinum are unchanged. IMPRESSION: 1. Support apparatus as above. 2. Small left effusion with underlying atelectasis, mildly more pronounced in the interval. 3. No other changes. Electronically Signed   By: Dorise Bullion [name] M.D.   On: 08/04/2022 08:27   DG Chest Port 1 View  Result Date: 08/03/2022 CLINICAL DATA:  Status post CABG x3. EXAM: PORTABLE CHEST 1 VIEW COMPARISON:  Chest two views 08/02/2022 FINDINGS: Interval median sternotomy. New endotracheal tube tip terminates approximately 4.3 cm above the carina. New right internal jugular central venous catheter sheath with indwelling catheter that terminates overlying the pulmonary outflow tract. New enteric tube that descends below the diaphragm with the tip excluded by collimation. New left chest and right paramidline mediastinal catheters. Mildly decreased lung volumes with mild bibasilar horizontal linear subsegmental atelectasis. No definite pleural effusion or pneumothorax. Moderate multilevel degenerative disc changes of the thoracic spine. IMPRESSION: 1. Interval median sternotomy with support apparatus in appropriate position, as above. 2. Mildly decreased lung volumes with mild bibasilar subsegmental atelectasis. Electronically Signed   By: Yvonne Kendall M.D.   On: 08/03/2022 14:49   VAS US DOPPLER PRE CABG  Result Date: 08/02/2022 PREOPERATIVE VASCULAR EVALUATION Patient Name:  Hector Edwards  Date of Exam:   08/02/2022 Medical Rec #: [id number]         Accession #:    [id number] Date of Birth: [birth year]        Patient Gender: M Patient Age:   84 years Exam Location:  [facility] Procedure:      VAS US DOPPLER PRE CABG Referring Phys: Gilford Raid --------------------------------------------------------------------------------  Indications:      Pre-CABG. Risk Factors:     Hypertension, hyperlipidemia, no history of smoking, coronary                   artery disease. Comparison Study: No prior  studies. Performing Technologist: Darlin Coco RDMS RVT  Examination Guidelines: A complete evaluation includes B-mode imaging, spectral Doppler, color Doppler, and power Doppler as needed of all accessible portions of each vessel. Bilateral testing is considered an integral part of a complete examination. Limited examinations for reoccurring indications may be performed as noted.  Right Carotid Findings: +----------+--------+--------+--------+--------+--------+           PSV cm/sEDV cm/sStenosisDescribeComments +----------+--------+--------+--------+--------+--------+ CCA Prox  61      8                                +----------+--------+--------+--------+--------+--------+ CCA Distal60      10                               +----------+--------+--------+--------+--------+--------+ ICA Prox  140  31      1-39%                    +----------+--------+--------+--------+--------+--------+ ICA Mid   88      16                               +----------+--------+--------+--------+--------+--------+ ICA Distal84      20                               +----------+--------+--------+--------+--------+--------+ ECA       178                                      +----------+--------+--------+--------+--------+--------+ +----------+--------+-------+----------------+------------+           PSV cm/sEDV cmsDescribe        Arm Pressure +----------+--------+-------+----------------+------------+ Subclavian111            Multiphasic, WNL             +----------+--------+-------+----------------+------------+ +---------+--------+--+--------+-+---------+ VertebralPSV cm/s56EDV cm/s8Antegrade +---------+--------+--+--------+-+---------+ Left Carotid Findings: +----------+--------+--------+--------+--------+--------+           PSV cm/sEDV cm/sStenosisDescribeComments +----------+--------+--------+--------+--------+--------+ CCA Prox  114     16                                +----------+--------+--------+--------+--------+--------+ CCA Distal58      15                               +----------+--------+--------+--------+--------+--------+ ICA Prox  67      10                               +----------+--------+--------+--------+--------+--------+ ICA Mid   106     29                               +----------+--------+--------+--------+--------+--------+ ICA Distal96      26                               +----------+--------+--------+--------+--------+--------+ ECA       126     11                               +----------+--------+--------+--------+--------+--------+  +----------+--------+--------+------------------------------------+------------+ SubclavianPSV cm/sEDV cm/sDescribe                            Arm Pressure +----------+--------+--------+------------------------------------+------------+           203             Elevated velocities without evidence                                       of focal stenosis                                +----------+--------+--------+------------------------------------+------------+ +---------+--------+--+--------+-+---------+  VertebralPSV cm/s26EDV cm/s5Antegrade +---------+--------+--+--------+-+---------+  ABI Findings: +--------+------------------+-----+---------+--------+ Right   Rt Pressure (mmHg)IndexWaveform Comment  +--------+------------------+-----+---------+--------+ [id number]                    triphasic         +--------+------------------+-----+---------+--------+ PTA     233               1.25 triphasic         +--------+------------------+-----+---------+--------+ DP      228               1.23 triphasic         +--------+------------------+-----+---------+--------+ +--------+------------------+-----+---------+-------+ Left    Lt Pressure (mmHg)IndexWaveform Comment  +--------+------------------+-----+---------+-------+ [id number]                    triphasic        +--------+------------------+-----+---------+-------+ PTA     226               1.22 triphasic        +--------+------------------+-----+---------+-------+ DP      226               1.22 triphasic        +--------+------------------+-----+---------+-------+  Right Doppler Findings: +--------+--------+-----+---------+--------+ Site    PressureIndexDoppler  Comments +--------+--------+-----+---------+--------+ [id number]          triphasic         +--------+--------+-----+---------+--------+ Radial               triphasic         +--------+--------+-----+---------+--------+ Ulnar                triphasic         +--------+--------+-----+---------+--------+  Left Doppler Findings: +--------+--------+-----+---------+--------+ Site    PressureIndexDoppler  Comments +--------+--------+-----+---------+--------+ [id number]          triphasic         +--------+--------+-----+---------+--------+ Radial               triphasic         +--------+--------+-----+---------+--------+ Ulnar                triphasic         +--------+--------+-----+---------+--------+   Summary: Right Carotid: Velocities in the right ICA are consistent with a 1-39% stenosis. Left Carotid: Velocities in the left ICA are consistent with a 1-39% stenosis. Vertebrals:  Bilateral vertebral arteries demonstrate antegrade flow. Subclavians: Normal flow hemodynamics were seen in the right subclavian artery.              Elevated left subclavian artery velocities without evidence of              focal stenosis. Right ABI: Resting right ankle-brachial index is within normal range. Left ABI: Resting left ankle-brachial index is within normal range. Right Upper Extremity: Doppler waveforms remain within normal limits with right radial compression. Doppler waveforms remain within normal limits  with right ulnar compression. Left Upper Extremity: Doppler waveforms remain within normal limits with left radial compression. Doppler waveforms remain within normal limits with left ulnar compression.  Electronically signed by Orlie Pollen on 08/02/2022 at 2:41:06 PM.    Final    DG Chest 2 View  Result Date: 08/02/2022 CLINICAL DATA:  Coronary artery disease with history of myocardial infarction without history of CABG. EXAM: CHEST - 2 VIEW COMPARISON:  None Available. FINDINGS: The heart size and mediastinal contours are within normal limits. Both lungs are clear. No visible pleural effusions or  pneumothorax. No acute osseous abnormality. IMPRESSION: No active cardiopulmonary disease. Electronically Signed   By: Margaretha Sheffield M.D.   On: 08/02/2022 10:38   CARDIAC CATHETERIZATION  Result Date: 08/01/2022   Mid RCA to Dist RCA lesion is 90% stenosed.   Ost RCA to Prox RCA lesion is 80% stenosed.   RPAV lesion is 99% stenosed.   RPDA lesion is 80% stenosed.   Mid LM to Dist LM lesion is 80% stenosed.   Ost Cx to Prox Cx lesion is 70% stenosed.   Dist LM to Ost LAD lesion is 80% stenosed.   Prox LAD to Mid LAD lesion is 60% stenosed.   Mid LAD lesion is 80% stenosed. Severe distal left main/three vessel CAD Severe, heavily calcified eccentric distal left main stenosis Severe ostial LAD stenosis. Severe mid LAD stenosis Severe ostial Circumflex stenosis. Large dominant RCA with severe ostial stenosis. Severe mid to distal vessel stenosis. Severe stenosis in the proximal posterolateral artery. Severe ostial PDA stenosis. LVEDP 18 mmHg Recommendations: Severe left main and three vessel. Nealie Mchatton admit to telemetry and consult CT surgery to review candidacy for bypass surgery. Blayton Huttner add statin, beta blocker and ASA.   CT CORONARY FRACTIONAL FLOW RESERVE DATA PREP  Result Date: 07/24/2022 EXAM: FFRCT ANALYSIS FINDINGS: FFRct analysis was performed on the original cardiac CT angiogram dataset. Diagrammatic  representation of the FFRct analysis is provided in a separate PDF document in PACS. This dictation was created using the PDF document and an interactive 3D model of the results. 3D model is not available in the EMR/PACS. Normal FFR range is >0.80. 1. Left Main: Normal 2. LAD: Mid 0.5, abnormal 3. LCX: findings , 4. Ramus: N/a 5. RCA: Mid 0.5, abnormal IMPRESSION: 1.  Severe flow limiting mid RCA and mid LAD disease. Note: These examples are not recommendations of [employer] and only provided as examples of what other customers are doing. Electronically Signed   By: Candee Furbish M.D.   On: 07/24/2022 15:42   CT CORONARY FRACTIONAL FLOW RESERVE FLUID ANALYSIS  Result Date: 07/24/2022 EXAM: FFRCT ANALYSIS FINDINGS: FFRct analysis was performed on the original cardiac CT angiogram dataset. Diagrammatic representation of the FFRct analysis is provided in a separate PDF document in PACS. This dictation was created using the PDF document and an interactive 3D model of the results. 3D model is not available in the EMR/PACS. Normal FFR range is >0.80. 1. Left Main: Normal 2. LAD: Mid 0.5, abnormal 3. LCX: findings , 4. Ramus: N/a 5. RCA: Mid 0.5, abnormal IMPRESSION: 1.  Severe flow limiting mid RCA and mid LAD disease. Note: These examples are not recommendations of [employer] and only provided as examples of what other customers are doing. Electronically Signed   By: Candee Furbish M.D.   On: 07/24/2022 15:42   CT CORONARY MORPH W/CTA COR W/SCORE W/CA W/CM &/OR WO/CM  Addendum Date: 07/24/2022   ADDENDUM REPORT: 07/24/2022 11:43 EXAM: OVER-READ INTERPRETATION  CT CHEST The following report is a limited chest CT over-read performed by radiologist Dr. Lindaann Slough [facility] on 07/24/2022. This over-read does not include interpretation of cardiac or coronary anatomy or pathology. The coronary CTA interpretation by the cardiologist is attached. COMPARISON:  None. FINDINGS: Mediastinum/Nodes: No  enlarged lymph nodes within the visualized mediastinum. Lungs/Pleura: There is no pleural effusion. The visualized lungs appear clear for the degree of inspiration. No suspicious pulmonary nodule. Upper abdomen: No significant findings in the visualized upper abdomen. Musculoskeletal/Chest wall: No chest wall mass or suspicious  osseous findings within the visualized chest. Multilevel thoracic spondylosis. IMPRESSION: No significant extracardiac findings within the visualized chest. Electronically Signed   By: Richardean Sale M.D.   On: 07/24/2022 11:43   Result Date: 07/24/2022 CLINICAL DATA:  76 year old with dyspnea. EXAM: Cardiac/Coronary  CTA TECHNIQUE: The patient was scanned on a [employer]. FINDINGS: A 120 kV prospective scan was triggered in the descending thoracic aorta at 111 HU's. Axial non-contrast 3 mm slices were carried out through the heart. The data set was analyzed on a dedicated work station and scored using the Ama. Gantry rotation speed was 250 msecs and collimation was .6 mm. 0.8 mg of sl NTG was given. The 3D data set was reconstructed in 5% intervals of the 67-82 % of the R-R cycle. Diastolic phases were analyzed on a dedicated work station using MPR, MIP and VRT modes. The patient received 80 cc of contrast. Image quality: good Aorta: Normal size (76m aortic root). 362mascending aorta. Aortic atherosclerosis. No dissection. Aortic Valve:  Trileaflet.  Aortic valve sclerosis. Coronary Arteries:  Normal coronary origin.  Right dominance. RCA is a large dominant artery that gives rise to PDA and PLA. There is scattered severe calcified plaque. Possible severe proximal stenosis. Left main is a large artery that gives rise to LAD and LCX arteries. There is calcified plaque 0-24% stenosis. LAD is a large vessel that has diffuse severe calcified plaque. There is mid 70-99% stenosis. Sending for FFR. D1 - small caliber proximal calcified plaque, possible severe stenosis.  LCX is a non-dominant artery that gives rise to one large OM1 branch. There is no plaque. Ramus - proximal calcified vessel with small caliber mid to distal vessel, possible severe stenosis. Other findings: Normal pulmonary vein drainage into the left atrium. Normal left atrial appendage without a thrombus. Normal size of the pulmonary artery. Please see radiology report for non cardiac findings. IMPRESSION: 1. Coronary calcium score of 3506. This was 9631ercentile for age and sex matched control. 2. Normal coronary origin with right dominance. 3. Severe multivessel CAD - sending for FFR. Electronically Signed: By: MaCandee Furbish.D. On: 07/24/2022 11:08      Cardiac Studies & Procedures   CARDIAC CATHETERIZATION  CARDIAC CATHETERIZATION 08/01/2022  Narrative   Mid RCA to Dist RCA lesion is 90% stenosed.   Ost RCA to Prox RCA lesion is 80% stenosed.   RPAV lesion is 99% stenosed.   RPDA lesion is 80% stenosed.   Mid LM to Dist LM lesion is 80% stenosed.   Ost Cx to Prox Cx lesion is 70% stenosed.   Dist LM to Ost LAD lesion is 80% stenosed.   Prox LAD to Mid LAD lesion is 60% stenosed.   Mid LAD lesion is 80% stenosed.  Severe distal left main/three vessel CAD Severe, heavily calcified eccentric distal left main stenosis Severe ostial LAD stenosis. Severe mid LAD stenosis Severe ostial Circumflex stenosis. Large dominant RCA with severe ostial stenosis. Severe mid to distal vessel stenosis. Severe stenosis in the proximal posterolateral artery. Severe ostial PDA stenosis. LVEDP 18 mmHg ECHOCARDIOGRAM COMPLETE 07/05/2022     1. Mild concentric LVH with proximal septal thickening. Thickened aortic valve with mild AS (mean gradient 12 mmHg). 2. Left ventricular ejection fraction, by estimation, is 60 to 65%. The left ventricle has normal function. The left ventricle has no regional wall motion abnormalities. There is moderate left ventricular hypertrophy of the basal-septal segment.  Left ventricular diastolic parameters are consistent with  Grade I diastolic dysfunction (impaired relaxation). 3. Right ventricular systolic function is normal. The right ventricular size is normal. 4. The mitral valve is normal in structure. No evidence of mitral valve regurgitation. No evidence of mitral stenosis. 5. The aortic valve is tricuspid. Aortic valve regurgitation is not visualized. Mild aortic valve stenosis. 6. Aortic dilatation noted. There is mild dilatation of the aortic root, measuring 42 mm. 7. The inferior vena cava is normal in size with greater than 50% respiratory variability, suggesting right atrial pressure of 3 mmHg.  ECHO INTRAOPERATIVE TEE 36/14/4315  Complications: No known complications during this procedure. POST-OP IMPRESSIONS _ Left Ventricle: The left ventricle is unchanged from pre-bypass. _ Right Ventricle: The right ventricle appears unchanged from pre-bypass. _ Aorta: The aorta appears unchanged from pre-bypass. _ Left Atrium: The left atrium appears unchanged from pre-bypass. _ Left Atrial Appendage: The left atrial appendage appears unchanged from pre-bypass. _ Aortic Valve: The aortic valve appears unchanged from pre-bypass. _ Mitral Valve: The mitral valve appears unchanged from pre-bypass. _ Tricuspid Valve: The tricuspid valve appears unchanged from pre-bypass. _ Pulmonic Valve: The pulmonic valve appears unchanged from pre-bypass. _ Interatrial Septum: The interatrial septum appears unchanged from pre-bypass. _ Interventricular Septum: The interventricular septum appears unchanged from pre-bypass. _ Pericardium: The pericardium appears unchanged from pre-bypass.          QMG:QQPYP shows possible atrial fib vs atrial flutter at 133 bpm (personally reviewed)  Baseline EKG 10/26 with LBBB at ~124 ms.  TELEMETRY: Atrial flutter 120-130s, started yesterday am ~ 0530 once he got back from walking and had a coughing spell (personally  reviewed)  Assessment/Plan: 1.  Post op atrial flutter / fib Started with a coughing fit 10/30 ~ 0530. Briefly had Wide QRS at the start, mostly consistent with aberrancy with LBBB at baseline Amiodarone started 10/30 at 0600 (Only approx 1 g load thus far) Eliquis started today.  Diltiazem increased to 90 mg q 6 hours. Can change to gtt if needed. His pacing leads were pulled over the weekend, so unable to consider pacing out (though at times rhythm irregularly irregular, and not clear it would have been amenable to that.  Intra-op TEE without atrial thrombus.  2. CAD s/p CABG x 3 Cath showed severe LM and 3 vessel disease Underwent CABG 08/03/2022 Otherwise post op course has gone well. Pressors titrated off  His HRs remain poorly controlled. Would titrate diltiazem as BP tolerates, potentially switch back to IV.   Graydon Fofana dissuss with MD and TCTS timing and utility of Wheeler. He ate today, but tomorrow Meah Jiron be > 48 hrs if he remains out of rhythm.      For questions or updates, please contact [place] Please consult [email or web address] for contact info under Cardiology/STEMI.  Signed, Shirley Friar, PA-C  08/07/2022 9:51 AM   I have seen and examined this patient with Oda Kilts.  Agree with above, note added to reflect my findings.  Patient has a history significant for coronary artery disease and now status post CABG x3 on 08/03/2022.  Unfortunately he has gone into atrial fibrillation and atrial flutter.  He feels well other than some palpitations and fatigue when his arrhythmia started.  He has been started on amiodarone but has not converted to sinus rhythm.   GEN: Well nourished, well developed, in no acute distress  HEENT: normal  Neck: no JVD, carotid bruits, or masses Cardiac: Irregular, tachycardic; no murmurs, rubs, or gallops,no edema  Respiratory:  clear to auscultation bilaterally,  normal work of breathing GI: soft, nontender, nondistended, + BS MS: no  deformity or atrophy  Skin: warm and dry Neuro:  Strength and sensation are intact Psych: euthymic mood, full affect   Postoperative atrial fibrillation: Currently on amiodarone and Eliquis.  As he has not recently gone into atrial fibrillation, he would likely benefit from a rhythm control strategy.  We have him set up for TEE and cardioversion tomorrow if he does not convert to sinus rhythm.  We Ortencia Askari continue IV amiodarone and Eliquis for now.   Coronary artery disease: Status post CABG x3.  Postop course per primary team.  Hassie Mandt M. Amyla Heffner MD 08/07/2022 5:19 PM

## 2022-08-07 NOTE — Progress Notes (Signed)
   08/07/22 1455  Clinical Encounter Type  Visited With Health care provider;Patient and family together  Visit Type Initial;Critical Care  Referral From Nurse  Consult/Referral To Chaplain Melvenia Beam)  Recommendations Rounding Visit.   Rounding Visit/Suggested by Nurse:  Bonney Roussel briefly met with patient and wife while patient was sitting up in chair. Chaplain introduced [place] available. Allowed patient opportunity to rest. Chaplain Melvenia Beam, M. Min., [phone]

## 2022-08-07 NOTE — TOC Benefit Eligibility Note (Signed)
Patient Teacher, English as a foreign language completed.    The patient is currently admitted and upon discharge could be taking Eliquis '5mg'$ .  The current 30 day co-pay is $47.00.   The patient is insured through Catheryn Bacon, [place] Patient Advocate Specialist [place] Patient Advocate Team Direct Number: [phone] Fax: [phone]

## 2022-08-07 NOTE — Progress Notes (Signed)
ANTICOAGULATION CONSULT NOTE - Initial Consult  Pharmacy Consult for Eliquis Indication: atrial fibrillation  Allergies  Allergen Reactions   Hctz [Hydrochlorothiazide] Shortness Of Breath   Enalapril Maleate     cramps   Tribenzor [Olmesartan-Amlodipine-Hctz] Swelling    Fatigue, CP, edema   Verapamil     Fatigue    Patient Measurements: Height: '6\' 4"'$  (193 cm) Weight: (!) 137 kg (302 lb 0.5 oz) IBW/kg (Calculated) : 86.8  Vital Signs: Temp: 98.3 F (36.8 C) (10/31 0322) Temp Source: Oral (10/31 0322) BP: 139/64 (10/31 1000) Pulse Rate: 117 (10/31 1000)  Labs: Recent Labs    08/04/22 1653 08/05/22 0327 08/06/22 0447 08/07/22 0115  HGB 10.0* 8.9* 8.7*  --   HCT 29.2* 27.2* 25.5*  --   PLT 158 124* 146*  --   CREATININE 1.06 0.94 0.86 0.82    Estimated Creatinine Clearance: 119.5 mL/min (by C-G formula based on SCr of 0.82 mg/dL).   Medical History: Past Medical History:  Diagnosis Date   Depression    GERD (gastroesophageal reflux disease)    Hx of adenomatous colonic polyps 09/04/2015   Hyperlipidemia    Hypertension    LBP (low back pain)    OSA (obstructive sleep apnea) 08/01/2018   Sleep apnea    wears cpap     Medications:  Medications Prior to Admission  Medication Sig Dispense Refill Last Dose   benazepril (LOTENSIN) 20 MG tablet TAKE TWO TABLETS BY MOUTH EVERYDAY AT BEDTIME 60 tablet 0 07/31/2022   pantoprazole (PROTONIX) 40 MG tablet TAKE ONE TABLET BY MOUTH EVERY EVENING 30 tablet 0 07/31/2022   Scheduled:   acetaminophen  1,000 mg Oral Q6H   Or   acetaminophen (TYLENOL) oral liquid 160 mg/5 mL  1,000 mg Per Tube Q6H   apixaban  5 mg Oral BID   aspirin EC  81 mg Oral Daily   Or   aspirin  81 mg Per Tube Daily   atorvastatin  80 mg Oral Daily   bisacodyl  10 mg Oral Daily   Or   bisacodyl  10 mg Rectal Daily   Chlorhexidine Gluconate Cloth  6 each Topical Q0600   diltiazem  90 mg Oral Q6H   docusate sodium  200 mg Oral Daily    Ferrous Fumarate  1 tablet Oral Daily   And   folic acid-pyridoxine-cyancobalamin  1 tablet Oral Daily   pantoprazole  40 mg Oral Daily   sodium chloride flush  3 mL Intravenous Q12H   Infusions:   sodium chloride Stopped (08/04/22 1533)   amiodarone 30 mg/hr (08/07/22 1045)    Assessment: Patient is s/p CABG on 10/27 for severe multivessel disease. Post-operative course has been complicated by atrial fibrillation, however patient remains asymptomatic and hemodynamically stable. Pharmacy consulted to initiate Eliquis for atrial fibrillation.   No CBC ordered today. Will put in for CBC now. On 10/30 hemoglobin and platelets were low but stable. No need for dose reduction at this time.  Plan:  Start Eliquis 5 mg twice daily  Thank you for allowing pharmacy to participate in this patient's care.  Reatha Harps, PharmD PGY2 Pharmacy Resident 08/07/2022 11:23 AM Check [email or web address] for unit specific pharmacy number

## 2022-08-07 NOTE — Progress Notes (Signed)
      [address]       [place] [address]             [phone]      4 Days Post-Op Procedure(s) (LRB): CORONARY ARTERY BYPASS GRAFTING (CABG) 3X, USING RIGHT GREATER SAPHENOUS VEIN AND LEFT INTERNAL MAMMARY ARTERY. (N/A) TRANSESOPHAGEAL ECHOCARDIOGRAM (TEE) (N/A)  Subjective:  Patient feels tired.  Made worse by coughing.  Objective: Vital signs in last 24 hours: Temp:  [98.1 F (36.7 C)-98.5 F (36.9 C)] 98.5 F (36.9 C) (10/31 1136) Pulse Rate:  [62-138] 113 (10/31 1130) Cardiac Rhythm: Atrial fibrillation (10/31 1150) Resp:  [13-32] 17 (10/31 1130) BP: (98-155)/(48-78) 147/63 (10/31 1130) SpO2:  [90 %-98 %] 97 % (10/31 1130) Weight:  [256 kg] 137 kg (10/31 0530)  Intake/Output from previous day: 10/30 0701 - 10/31 0700 In: 1590.8 [P.O.:300; I.V.:1290.8] Out: 1800 [Urine:1800] Intake/Output this shift: Total I/O In: 546.4 [P.O.:480; I.V.:66.4] Out: 200 [Urine:200]  General appearance: alert, cooperative, and no distress Heart: irregularly irregular rhythm Lungs: clear to auscultation bilaterally Abdomen: soft, non-tender; bowel sounds normal; no masses,  no organomegaly Extremities: edema + pitting Wound: clean and dry  Lab Results: Recent Labs    08/05/22 0327 08/06/22 0447  WBC 20.5* 14.4*  HGB 8.9* 8.7*  HCT 27.2* 25.5*  PLT 124* 146*   BMET:  Recent Labs    08/06/22 0447 08/07/22 0115  NA 136 137  K 3.8 3.9  CL 104 102  CO2 25 23  GLUCOSE 123* 125*  BUN 19 23  CREATININE 0.86 0.82  CALCIUM 8.4* 8.7*    PT/INR: No results for input(s): "LABPROT", "INR" in the last 72 hours. ABG    Component Value Date/Time   PHART 7.342 (L) 08/03/2022 2048   HCO3 19.6 (L) 08/03/2022 2048   TCO2 21 (L) 08/03/2022 2048   ACIDBASEDEF 5.0 (H) 08/03/2022 2048   O2SAT 97 08/03/2022 2048   CBG (last 3)  Recent Labs    08/06/22 2314 08/07/22 0806 08/07/22 1135  GLUCAP 113* 187* 113*    Assessment/Plan: S/P Procedure(s)  (LRB): CORONARY ARTERY BYPASS GRAFTING (CABG) 3X, USING RIGHT GREATER SAPHENOUS VEIN AND LEFT INTERNAL MAMMARY ARTERY. (N/A) TRANSESOPHAGEAL ECHOCARDIOGRAM (TEE) (N/A)  CV- Atrial Fibrillation persistent despite Amiodarone, Lopressor discontinued, Cardizem and Digoxin started last night by Dr. Cyndia Bent, Eliquis was also initiated...EP consult was obtained  Pulm- off oxygen, chest tubes are out continue IS Renal- creatinine is WNL, weight is at baseline... not currently on diuretics, K is at 3.9 Expected post operative blood loss anemia, mild on iron Dispo- patient stable, EP consult appreciated for assistance with A. Fib management.. for likely cardioversion in the near future if rhythm doesn't improve, continue current care   LOS: 6 days   Ellwood Handler, PA-C 08/07/2022

## 2022-08-08 ENCOUNTER — Encounter (HOSPITAL_COMMUNITY)
Admission: AD | Disposition: A | Discharge status: Home Health | Payer: Self-pay | Source: Ambulatory Visit | Attending: Surgery

## 2022-08-08 ENCOUNTER — Inpatient Hospital Stay (HOSPITAL_COMMUNITY): Payer: Medicare HMO | Admitting: Certified Registered Nurse Anesthetist

## 2022-08-08 ENCOUNTER — Inpatient Hospital Stay (HOSPITAL_COMMUNITY): Payer: Medicare HMO

## 2022-08-08 ENCOUNTER — Encounter (HOSPITAL_COMMUNITY): Payer: Self-pay | Admitting: Surgery

## 2022-08-08 DIAGNOSIS — I4891 Unspecified atrial fibrillation: Secondary | ICD-10-CM | POA: Diagnosis not present

## 2022-08-08 DIAGNOSIS — Z951 Presence of aortocoronary bypass graft: Secondary | ICD-10-CM | POA: Diagnosis not present

## 2022-08-08 DIAGNOSIS — I350 Nonrheumatic aortic (valve) stenosis: Secondary | ICD-10-CM

## 2022-08-08 DIAGNOSIS — I4819 Other persistent atrial fibrillation: Secondary | ICD-10-CM | POA: Diagnosis not present

## 2022-08-08 DIAGNOSIS — Z9989 Dependence on other enabling machines and devices: Secondary | ICD-10-CM

## 2022-08-08 DIAGNOSIS — G4733 Obstructive sleep apnea (adult) (pediatric): Secondary | ICD-10-CM

## 2022-08-08 DIAGNOSIS — I480 Paroxysmal atrial fibrillation: Secondary | ICD-10-CM

## 2022-08-08 DIAGNOSIS — I2510 Atherosclerotic heart disease of native coronary artery without angina pectoris: Secondary | ICD-10-CM | POA: Diagnosis not present

## 2022-08-08 DIAGNOSIS — I700 Atherosclerosis of aorta: Secondary | ICD-10-CM

## 2022-08-08 HISTORY — PX: CARDIOVERSION: SHX1299

## 2022-08-08 HISTORY — PX: TEE WITHOUT CARDIOVERSION: SHX5443

## 2022-08-08 LAB — CBC
HCT: 29.3 % — ABNORMAL LOW (ref 39.0–52.0)
Hemoglobin: 9.7 g/dL — ABNORMAL LOW (ref 13.0–17.0)
MCH: 33.4 pg (ref 26.0–34.0)
MCHC: 33.1 g/dL (ref 30.0–36.0)
MCV: 101 fL — ABNORMAL HIGH (ref 80.0–100.0)
Platelets: 254 10*3/uL (ref 150–400)
RBC: 2.9 MIL/uL — ABNORMAL LOW (ref 4.22–5.81)
RDW: 14.6 % (ref 11.5–15.5)
WBC: 14.8 10*3/uL — ABNORMAL HIGH (ref 4.0–10.5)
nRBC: 0 % (ref 0.0–0.2)

## 2022-08-08 LAB — ECHO TEE
AV Mean grad: 12 mmHg
AV Peak grad: 20.8 mmHg
Ao pk vel: 2.28 m/s

## 2022-08-08 LAB — PROTIME-INR
INR: 1.2 (ref 0.8–1.2)
Prothrombin Time: 15.3 seconds — ABNORMAL HIGH (ref 11.4–15.2)

## 2022-08-08 LAB — DIGOXIN LEVEL: Digoxin Level: 0.6 ng/mL — ABNORMAL LOW (ref 0.8–2.0)

## 2022-08-08 SURGERY — ECHOCARDIOGRAM, TRANSESOPHAGEAL
Anesthesia: Monitor Anesthesia Care

## 2022-08-08 MED ORDER — PROPOFOL 500 MG/50ML IV EMUL
INTRAVENOUS | Status: DC | PRN
  Administered 2022-08-08: 75 ug/kg/min via INTRAVENOUS

## 2022-08-08 MED ORDER — SODIUM CHLORIDE 0.9 % IV SOLN: INTRAVENOUS | Status: DC

## 2022-08-08 MED ORDER — PROPOFOL 10 MG/ML IV BOLUS
INTRAVENOUS | Status: DC | PRN
  Administered 2022-08-08: 20 mg via INTRAVENOUS
  Administered 2022-08-08 (×4): 10 mg via INTRAVENOUS

## 2022-08-08 NOTE — Transfer of Care (Signed)
Immediate Anesthesia Transfer of Care Note  Patient: Kyle J Dragovich  Procedure(s) Performed: TRANSESOPHAGEAL ECHOCARDIOGRAM (TEE) CARDIOVERSION  Patient Location: Endoscopy Unit  Anesthesia Type:MAC  Level of Consciousness: drowsy  Airway & Oxygen Therapy: Patient Spontanous Breathing and Patient connected to nasal cannula oxygen  Post-op Assessment: Report given to RN and Post -op Vital signs reviewed and stable  Post vital signs: Reviewed and stable  Last Vitals: see postop ENDO VS flowsheet Vitals Value Taken Time  BP    Temp    Pulse    Resp 26 08/08/22 1437  SpO2    Vitals shown include unvalidated device data.  Last Pain:  Vitals:   08/08/22 1310  TempSrc: Temporal  PainSc: 0-No pain      Patients Stated Pain Goal: 2 (59/09/31 1216)  Complications: No notable events documented.

## 2022-08-08 NOTE — Progress Notes (Signed)
      [address]       [place] [address]             [phone]      POD # 5 CABG  Up in chair BP (!) 152/68   Pulse 78   Temp 97.6 F (36.4 C) (Oral)   Resp 18   Ht '6\' 4"'$  (1.93 m)   Wt 134.7 kg   SpO2 97%   BMI 36.15 kg/m   Intake/Output Summary (Last 24 hours) at 08/08/2022 1821 Last data filed at 08/08/2022 1800 Gross per 24 hour  Intake 1281.35 ml  Output 1075 ml  Net 206.35 ml   TEE earlier today EF 60%, cardioverted to SR On amiodarone at 30 mg/hr  Remo Lipps C. Roxan Hockey, MD [facility] [phone]

## 2022-08-08 NOTE — H&P (View-Only) (Signed)
Electrophysiology Rounding Note  Patient Name: Hector Edwards Date of Encounter: 08/08/2022  Primary Cardiologist: Lauree Chandler, MD Electrophysiologist: None   Subjective   Feeling OK. Walked this am without SOB or dizziness. HR in 120-130s, not > 140.   Inpatient Medications    Scheduled Meds:  acetaminophen  1,000 mg Oral Q6H   Or   acetaminophen (TYLENOL) oral liquid 160 mg/5 mL  1,000 mg Per Tube Q6H   apixaban  5 mg Oral BID   aspirin EC  81 mg Oral Daily   Or   aspirin  81 mg Per Tube Daily   atorvastatin  80 mg Oral Daily   bisacodyl  10 mg Oral Daily   Or   bisacodyl  10 mg Rectal Daily   Chlorhexidine Gluconate Cloth  6 each Topical Q0600   diltiazem  90 mg Oral Q6H   docusate sodium  200 mg Oral Daily   Ferrous Fumarate  1 tablet Oral Daily   And   folic acid-pyridoxine-cyancobalamin  1 tablet Oral Daily   pantoprazole  40 mg Oral Daily   sodium chloride flush  3 mL Intravenous Q12H   Continuous Infusions:  sodium chloride Stopped (08/04/22 1533)   sodium chloride 20 mL/hr at 08/08/22 0700   amiodarone 30 mg/hr (08/08/22 0700)   PRN Meds: melatonin, metoprolol tartrate, morphine injection, naphazoline-glycerin, ondansetron (ZOFRAN) IV, mouth rinse, oxyCODONE, sodium chloride flush, traMADol   Vital Signs    Vitals:   08/08/22 0500 08/08/22 0541 08/08/22 0600 08/08/22 0700  BP: 93/75 (!) 143/71 (!) 133/121 114/87  Pulse: (!) 133  (!) 138 (!) 108  Resp: (!) 24  (!) 37 13  Temp:      TempSrc:      SpO2: 95%  99% 96%  Weight: 134.7 kg     Height:        Intake/Output Summary (Last 24 hours) at 08/08/2022 0705 Last data filed at 08/08/2022 0700 Gross per 24 hour  Intake 1338.24 ml  Output 1100 ml  Net 238.24 ml   Filed Weights   08/06/22 0500 08/07/22 0530 08/08/22 0500  Weight: 136 kg (!) 137 kg 134.7 kg    Physical Exam    GEN- The patient is fatigued appearing, alert and oriented x 3 today.   Head- normocephalic,  atraumatic Eyes-  Sclera clear, conjunctiva pink Ears- hearing intact Oropharynx- clear Neck- supple Lungs- Clear to ausculation bilaterally, normal work of breathing Heart-  Tachy and irr irr  rate and rhythm, no murmurs, rubs or gallops GI- soft, NT, ND, + BS Extremities- no clubbing or cyanosis. No edema Skin- no rash or lesion Psych- flat but appropriate affect Neuro- strength and sensation are intact  Labs    CBC Recent Labs    08/07/22 1232 08/08/22 0100  WBC 14.5* 14.8*  HGB 9.6* 9.7*  HCT 28.0* 29.3*  MCV 98.6 101.0*  PLT 245 338   Basic Metabolic Panel Recent Labs    08/06/22 0447 08/07/22 0115  NA 136 137  K 3.8 3.9  CL 104 102  CO2 25 23  GLUCOSE 123* 125*  BUN 19 23  CREATININE 0.86 0.82  CALCIUM 8.4* 8.7*  MG  --  2.2   Liver Function Tests No results for input(s): "AST", "ALT", "ALKPHOS", "BILITOT", "PROT", "ALBUMIN" in the last 72 hours. No results for input(s): "LIPASE", "AMYLASE" in the last 72 hours. Cardiac Enzymes No results for input(s): "CKTOTAL", "CKMB", "CKMBINDEX", "TROPONINI" in the last 72 hours.   Telemetry  Afib vs Aflutter with variable conduction in the 110-130s (personally reviewed)  Radiology    No results found.  Patient Profile     Hector Edwards is a 76 y.o. male with a history of depression, GERD, obesity, HTN, HLD, sleep apnea and recently diagnosed CAD and mild aortic stenosis who is being seen today for the evaluation of Post op AFL at the request of Dr. Cyndia Bent.    Assessment & Plan    1.  Post op atrial flutter / fib Continue IV amiodarone for now.  Continue Eliquis.  NPO for TEE/DCC this afternoon.  Continue Diltiazem 90 mg q 6 hours.    2. CAD s/p CABG x 3 Cath showed severe LM and 3 vessel disease Underwent CABG 08/03/2022 Otherwise post op course has gone well. Pressors off  For TEE/DCC this am.   For questions or updates, please contact [place] Please consult [email or web address] for contact  info under Cardiology/STEMI.  Signed, Shirley Friar, PA-C  08/08/2022, 7:05 AM    I have seen and examined this patient with Oda Kilts.  Agree with above, note added to reflect my findings.  Remains in atrial fibrillation.  Plan for TEE and cardioversion today.  GEN: Well nourished, well developed, in no acute distress  HEENT: normal  Neck: no JVD, carotid bruits, or masses Cardiac: Tachycardic, irregular; no murmurs, rubs, or gallops,no edema  Respiratory:  clear to auscultation bilaterally, normal work of breathing GI: soft, nontender, nondistended, + BS MS: no deformity or atrophy  Skin: warm and dry Neuro:  Strength and sensation are intact Psych: euthymic mood, full affect   Postoperative atrial fibrillation: Currently on IV amiodarone.  Has been started on Eliquis.  We Zerah Hilyer plan for TEE and cardioversion this afternoon.  We Barton Want likely be able to consolidate and get him off of IV amiodarone post cardioversion. Coronary artery disease: Post CABG.  Postop course has gone well other than atrial fibrillation as above  Claborn Janusz M. Lesa Vandall MD 08/08/2022 10:08 AM

## 2022-08-08 NOTE — Progress Notes (Addendum)
Electrophysiology Rounding Note  Patient Name: Hector Edwards Date of Encounter: 08/08/2022  Primary Cardiologist: Hector Chandler, MD Electrophysiologist: None   Subjective   Feeling OK. Walked this am without SOB or dizziness. HR in 120-130s, not > 140.   Inpatient Medications    Scheduled Meds:  acetaminophen  1,000 mg Oral Q6H   Or   acetaminophen (TYLENOL) oral liquid 160 mg/5 mL  1,000 mg Per Tube Q6H   apixaban  5 mg Oral BID   aspirin EC  81 mg Oral Daily   Or   aspirin  81 mg Per Tube Daily   atorvastatin  80 mg Oral Daily   bisacodyl  10 mg Oral Daily   Or   bisacodyl  10 mg Rectal Daily   Chlorhexidine Gluconate Cloth  6 each Topical Q0600   diltiazem  90 mg Oral Q6H   docusate sodium  200 mg Oral Daily   Ferrous Fumarate  1 tablet Oral Daily   And   folic acid-pyridoxine-cyancobalamin  1 tablet Oral Daily   pantoprazole  40 mg Oral Daily   sodium chloride flush  3 mL Intravenous Q12H   Continuous Infusions:  sodium chloride Stopped (08/04/22 1533)   sodium chloride 20 mL/hr at 08/08/22 0700   amiodarone 30 mg/hr (08/08/22 0700)   PRN Meds: melatonin, metoprolol tartrate, morphine injection, naphazoline-glycerin, ondansetron (ZOFRAN) IV, mouth rinse, oxyCODONE, sodium chloride flush, traMADol   Vital Signs    Vitals:   08/08/22 0500 08/08/22 0541 08/08/22 0600 08/08/22 0700  BP: 93/75 (!) 143/71 (!) 133/121 114/87  Pulse: (!) 133  (!) 138 (!) 108  Resp: (!) 24  (!) 37 13  Temp:      TempSrc:      SpO2: 95%  99% 96%  Weight: 134.7 kg     Height:        Intake/Output Summary (Last 24 hours) at 08/08/2022 0705 Last data filed at 08/08/2022 0700 Gross per 24 hour  Intake 1338.24 ml  Output 1100 ml  Net 238.24 ml   Filed Weights   08/06/22 0500 08/07/22 0530 08/08/22 0500  Weight: 136 kg (!) 137 kg 134.7 kg    Physical Exam    GEN- The patient is fatigued appearing, alert and oriented x 3 today.   Head- normocephalic,  atraumatic Eyes-  Sclera clear, conjunctiva pink Ears- hearing intact Oropharynx- clear Neck- supple Lungs- Clear to ausculation bilaterally, normal work of breathing Heart-  Tachy and irr irr  rate and rhythm, no murmurs, rubs or gallops GI- soft, NT, ND, + BS Extremities- no clubbing or cyanosis. No edema Skin- no rash or lesion Psych- flat but appropriate affect Neuro- strength and sensation are intact  Labs    CBC Recent Labs    08/07/22 1232 08/08/22 0100  WBC 14.5* 14.8*  HGB 9.6* 9.7*  HCT 28.0* 29.3*  MCV 98.6 101.0*  PLT 245 024   Basic Metabolic Panel Recent Labs    08/06/22 0447 08/07/22 0115  NA 136 137  K 3.8 3.9  CL 104 102  CO2 25 23  GLUCOSE 123* 125*  BUN 19 23  CREATININE 0.86 0.82  CALCIUM 8.4* 8.7*  MG  --  2.2   Liver Function Tests No results for input(s): "AST", "ALT", "ALKPHOS", "BILITOT", "PROT", "ALBUMIN" in the last 72 hours. No results for input(s): "LIPASE", "AMYLASE" in the last 72 hours. Cardiac Enzymes No results for input(s): "CKTOTAL", "CKMB", "CKMBINDEX", "TROPONINI" in the last 72 hours.   Telemetry  Afib vs Aflutter with variable conduction in the 110-130s (personally reviewed)  Radiology    No results found.  Patient Profile     Hector Edwards is a 76 y.o. male with a history of depression, GERD, obesity, HTN, HLD, sleep apnea and recently diagnosed CAD and mild aortic stenosis who is being seen today for the evaluation of Post op AFL at the request of Dr. Cyndia Edwards.    Assessment & Plan    1.  Post op atrial flutter / fib Continue IV amiodarone for now.  Continue Eliquis.  NPO for TEE/DCC this afternoon.  Continue Diltiazem 90 mg q 6 hours.    2. CAD s/p CABG x 3 Cath showed severe LM and 3 vessel disease Underwent CABG 08/03/2022 Otherwise post op course has gone well. Pressors off  For TEE/DCC this am.   For questions or updates, please contact [place] Please consult [email or web address] for contact  info under Cardiology/STEMI.  Signed, Hector Friar, PA-C  08/08/2022, 7:05 AM    I have seen and examined this patient with Hector Edwards.  Agree with above, note added to reflect my findings.  Remains in atrial fibrillation.  Plan for TEE and cardioversion today.  GEN: Well nourished, well developed, in no acute distress  HEENT: normal  Neck: no JVD, carotid bruits, or masses Cardiac: Tachycardic, irregular; no murmurs, rubs, or gallops,no edema  Respiratory:  clear to auscultation bilaterally, normal work of breathing GI: soft, nontender, nondistended, + BS MS: no deformity or atrophy  Skin: warm and dry Neuro:  Strength and sensation are intact Psych: euthymic mood, full affect   Postoperative atrial fibrillation: Currently on IV amiodarone.  Has been started on Eliquis.  We Hector Edwards plan for TEE and cardioversion this afternoon.  We Hector Edwards likely be able to consolidate and get him off of IV amiodarone post cardioversion. Coronary artery disease: Post CABG.  Postop course has gone well other than atrial fibrillation as above  Hector Edwards M. Hector Pembroke MD 08/08/2022 10:08 AM

## 2022-08-08 NOTE — Anesthesia Preprocedure Evaluation (Addendum)
Anesthesia Evaluation  Patient identified by MRN, date of birth, ID band Patient awake    Reviewed: Allergy & Precautions, NPO status , Patient's Chart, lab work & pertinent test results  Airway Mallampati: [name]  TM Distance: >3 FB Neck ROM: Full    Dental no notable dental hx.    Pulmonary sleep apnea and Continuous Positive Airway Pressure Ventilation    Pulmonary exam normal        Cardiovascular hypertension, Pt. on medications + CAD and + CABG   Rhythm:Irregular Rate:Tachycardia     Neuro/Psych  PSYCHIATRIC DISORDERS  Depression    negative neurological ROS     GI/Hepatic ,GERD  Medicated and Controlled,,(+)     substance abuse    Endo/Other  negative endocrine ROS    Renal/GU negative Renal ROS     Musculoskeletal  (+) Arthritis ,    Abdominal  (+) + obese  Peds  Hematology  (+) Blood dyscrasia, anemia   Anesthesia Other Findings A-fib  Reproductive/Obstetrics                             Anesthesia Physical Anesthesia Plan  ASA: 4  Anesthesia Plan: MAC   Post-op Pain Management:    Induction: Intravenous  PONV Risk Score and Plan: 2 and Propofol infusion and Treatment may vary due to age or medical condition  Airway Management Planned: Nasal Cannula  Additional Equipment:   Intra-op Plan:   Post-operative Plan:   Informed Consent: I have reviewed the patients History and Physical, chart, labs and discussed the procedure including the risks, benefits and alternatives for the proposed anesthesia with the patient or authorized representative who has indicated his/her understanding and acceptance.     Dental advisory given  Plan Discussed with: CRNA  Anesthesia Plan Comments:         Anesthesia Quick Evaluation

## 2022-08-08 NOTE — Anesthesia Postprocedure Evaluation (Signed)
Anesthesia Post Note  Patient: Hector Edwards  Procedure(s) Performed: TRANSESOPHAGEAL ECHOCARDIOGRAM (TEE) CARDIOVERSION     Patient location during evaluation: Endoscopy Anesthesia Type: MAC Level of consciousness: awake Pain management: pain level controlled Vital Signs Assessment: post-procedure vital signs reviewed and stable Respiratory status: spontaneous breathing, nonlabored ventilation, respiratory function stable and patient connected to nasal cannula oxygen Cardiovascular status: stable and blood pressure returned to baseline Postop Assessment: no apparent nausea or vomiting Anesthetic complications: no   No notable events documented.  Last Vitals:  Vitals:   08/08/22 1530 08/08/22 1545  BP: 138/74 (!) 145/66  Pulse: 80 79  Resp: 18 13  Temp:    SpO2: 97% 96%    Last Pain:  Vitals:   08/08/22 1501  TempSrc:   PainSc: 0-No pain                 [name] P [name]

## 2022-08-08 NOTE — Interval H&P Note (Signed)
History and Physical Interval Note:  08/08/2022 1:23 PM  Hector Edwards  has presented today for surgery, with the diagnosis of afib.  The various methods of treatment have been discussed with the patient and family. After consideration of risks, benefits and other options for treatment, the patient has consented to  Procedure(s): TRANSESOPHAGEAL ECHOCARDIOGRAM (TEE) (N/A) CARDIOVERSION (N/A) as a surgical intervention.  The patient's history has been reviewed, patient examined, no change in status, stable for surgery.  I have reviewed the patient's chart and labs.  Questions were answered to the patient's satisfaction.     Skeet Latch, MD

## 2022-08-08 NOTE — Progress Notes (Signed)
5 Days Post-Op Procedure(s) (LRB): CORONARY ARTERY BYPASS GRAFTING (CABG) 3X, USING RIGHT GREATER SAPHENOUS VEIN AND LEFT INTERNAL MAMMARY ARTERY. (N/A) TRANSESOPHAGEAL ECHOCARDIOGRAM (TEE) (N/A) Subjective: No complaints.    Objective: Vital signs in last 24 hours: Temp:  [97.6 F (36.4 C)-98.5 F (36.9 C)] 98.4 F (36.9 C) (11/01 0746) Pulse Rate:  [79-138] 108 (11/01 0700) Cardiac Rhythm: Atrial fibrillation (11/01 0400) Resp:  [12-37] 13 (11/01 0700) BP: (93-158)/(52-121) 114/87 (11/01 0700) SpO2:  [91 %-99 %] 96 % (11/01 0700) Weight:  [134.7 kg] 134.7 kg (11/01 0500)  Hemodynamic parameters for last 24 hours:    Intake/Output from previous day: 10/31 0701 - 11/01 0700 In: 1338.2 [P.O.:840; I.V.:498.2] Out: 1100 [Urine:1100] Intake/Output this shift: No intake/output data recorded.  General appearance: alert and cooperative Neurologic: intact Heart: regularly irregular rhythm Lungs: clear to auscultation bilaterally Extremities: no edema Wound: incision ok  Lab Results: Recent Labs    08/07/22 1232 08/08/22 0100  WBC 14.5* 14.8*  HGB 9.6* 9.7*  HCT 28.0* 29.3*  PLT 245 254   BMET:  Recent Labs    08/06/22 0447 08/07/22 0115  NA 136 137  K 3.8 3.9  CL 104 102  CO2 25 23  GLUCOSE 123* 125*  BUN 19 23  CREATININE 0.86 0.82  CALCIUM 8.4* 8.7*    PT/INR:  Recent Labs    08/08/22 0100  LABPROT 15.3*  INR 1.2   ABG    Component Value Date/Time   PHART 7.342 (L) 08/03/2022 2048   HCO3 19.6 (L) 08/03/2022 2048   TCO2 21 (L) 08/03/2022 2048   ACIDBASEDEF 5.0 (H) 08/03/2022 2048   O2SAT 97 08/03/2022 2048   CBG (last 3)  Recent Labs    08/06/22 2314 08/07/22 0806 08/07/22 1135  GLUCAP 113* 187* 113*    Assessment/Plan: S/P Procedure(s) (LRB): CORONARY ARTERY BYPASS GRAFTING (CABG) 3X, USING RIGHT GREATER SAPHENOUS VEIN AND LEFT INTERNAL MAMMARY ARTERY. (N/A) TRANSESOPHAGEAL ECHOCARDIOGRAM (TEE) (N/A)  POD 5  Hemodynamically  stable but remains in atrial flutter 130's on IV amio and po cardizem 90 every 6. Received a digoxin load but level this am only 0.6 and it did not make any difference in his rhythm so will hold off on further digoxin. EP is planning TEE directed DCCV this afternoon.  Continue Eliquis and ASA 81.  Wt is below preop.  Glucose under adequate control.  Will plan to transfer to 4E after DCCV this afternoon.   LOS: 7 days    Gaye Pollack 08/08/2022

## 2022-08-08 NOTE — Anesthesia Procedure Notes (Signed)
Procedure Name: MAC Date/Time: 08/08/2022 1:56 PM  Performed by: Carolan Clines, CRNAPre-anesthesia Checklist: Patient identified, Emergency Drugs available, Suction available and Patient being monitored Patient Re-evaluated:Patient Re-evaluated prior to induction Oxygen Delivery Method: Nasal cannula Dental Injury: Teeth and Oropharynx as per pre-operative assessment

## 2022-08-08 NOTE — Progress Notes (Signed)
CAB POD 4  AF ongoing - has received amio, cardizem, dig.   Rate 130s  Patient comfortable, wife at bedside      Latest Ref Rng & Units 08/08/2022    1:00 AM 08/07/2022   12:32 PM 08/06/2022    4:47 AM  CBC  WBC 4.0 - 10.5 K/uL 14.8  14.5  14.4   Hemoglobin 13.0 - 17.0 g/dL 9.7  9.6  8.7   Hematocrit 39.0 - 52.0 % 29.3  28.0  25.5   Platelets 150 - 400 K/uL 254  245  146        Latest Ref Rng & Units 08/07/2022    1:15 AM 08/06/2022    4:47 AM 08/05/2022    3:27 AM  CMP  Glucose 70 - 99 mg/dL 125  123  139   BUN 8 - 23 mg/dL '23  19  20   '$ Creatinine 0.61 - 1.24 mg/dL 0.82  0.86  0.94   Sodium 135 - 145 mmol/L 137  136  134   Potassium 3.5 - 5.1 mmol/L 3.9  3.8  4.3   Chloride 98 - 111 mmol/L 102  104  101   CO2 22 - 32 mmol/L '23  25  25   '$ Calcium 8.9 - 10.3 mg/dL 8.7  8.4  8.3    I/O last 3 completed shifts: In: 2629 [P.O.:1140; I.V.:1489] Out: 2250 [Urine:2250]

## 2022-08-08 NOTE — CV Procedure (Signed)
Brief TEE Note  LVEF 60-60% Mild aortic stenosis.  Mean gradient 12 mmHg. No LA/LAA thrombus.   Aortic atherosclerosis.  For additional details see full report.  Electrical Cardioversion Procedure Note RIDLEY SCHEWE [id number] 12/02/1945  Procedure: Electrical Cardioversion Indications:  Atrial Fibrillation  Procedure Details Consent: Risks of procedure as well as the alternatives and risks of each were explained to the (patient/caregiver).  Consent for procedure obtained. Time Out: Verified patient identification, verified procedure, site/side was marked, verified correct patient position, special equipment/implants available, medications/allergies/relevent history reviewed, required imaging and test results available.  Performed  Patient placed on cardiac monitor, pulse oximetry, supplemental oxygen as necessary.  Sedation given:  propofol Pacer pads placed anterior and posterior chest.  Cardioverted 1 time(s).  Cardioverted at [place].  Evaluation Findings: Post procedure EKG shows: NSR Complications: None Patient did tolerate procedure well.   Skeet Latch, MD 08/08/2022, 2:26 PM

## 2022-08-08 NOTE — Progress Notes (Signed)
  Transition of Care (TOC) Screening Note   Patient Details  Name: SHAFTER JUPIN Date of Birth: September 01, 1946   Transition of Care [facility]) CM/SW Contact:    Milas Gain, [place] Phone Number: 08/08/2022, 3:32 PM    Transition of Care Department [facility]) has reviewed patient and no TOC needs have been identified at this time. We will continue to monitor patient advancement through interdisciplinary progression rounds. If new patient transition needs arise, please place a TOC consult.

## 2022-08-09 ENCOUNTER — Encounter (HOSPITAL_COMMUNITY): Payer: Self-pay | Admitting: Surgery

## 2022-08-09 DIAGNOSIS — I4892 Unspecified atrial flutter: Secondary | ICD-10-CM

## 2022-08-09 DIAGNOSIS — Z951 Presence of aortocoronary bypass graft: Secondary | ICD-10-CM | POA: Diagnosis not present

## 2022-08-09 DIAGNOSIS — I2510 Atherosclerotic heart disease of native coronary artery without angina pectoris: Secondary | ICD-10-CM | POA: Diagnosis not present

## 2022-08-09 LAB — BASIC METABOLIC PANEL
Anion gap: 8 (ref 5–15)
BUN: 17 mg/dL (ref 8–23)
CO2: 25 mmol/L (ref 22–32)
Calcium: 8.4 mg/dL — ABNORMAL LOW (ref 8.9–10.3)
Chloride: 102 mmol/L (ref 98–111)
Creatinine, Ser: 0.81 mg/dL (ref 0.61–1.24)
GFR, Estimated: >60 mL/min (ref >60)
Glucose, Bld: 135 mg/dL — ABNORMAL HIGH (ref 70–99)
Potassium: 3.8 mmol/L (ref 3.5–5.1)
Sodium: 135 mmol/L (ref 135–145)

## 2022-08-09 MED ORDER — AMIODARONE HCL IN DEXTROSE 360-4.14 MG/200ML-% IV SOLN
30.0000 mg/h | INTRAVENOUS | Status: AC
  Administered 2022-08-09: 30 mg/h via INTRAVENOUS
  Filled 2022-08-09: qty 200

## 2022-08-09 MED ORDER — AMIODARONE HCL IN DEXTROSE 360-4.14 MG/200ML-% IV SOLN: 30.0000 mg/h | INTRAVENOUS | Status: DC

## 2022-08-09 MED ORDER — AMIODARONE HCL 200 MG PO TABS
400.0000 mg | ORAL_TABLET | Freq: Two times a day (BID) | ORAL | Status: DC
  Administered 2022-08-09 – 2022-08-18 (×19): 400 mg via ORAL
  Filled 2022-08-09 (×20): qty 2

## 2022-08-09 NOTE — Progress Notes (Addendum)
Came to see pt for ambulation but pt is sleeping. Will f/u as time allows.

## 2022-08-09 NOTE — Progress Notes (Addendum)
Rounding Note    Patient Name: Hector Edwards Date of Encounter: 08/09/2022  [place] Cardiologist: Lauree Chandler, MD   Subjective   He feels well, no further palpitation since cardioversion. He has some incision pain when move around.   Inpatient Medications    Scheduled Meds:  amiodarone  400 mg Oral BID   apixaban  5 mg Oral BID   aspirin EC  81 mg Oral Daily   Or   aspirin  81 mg Per Tube Daily   atorvastatin  80 mg Oral Daily   bisacodyl  10 mg Oral Daily   Or   bisacodyl  10 mg Rectal Daily   Chlorhexidine Gluconate Cloth  6 each Topical Q0600   diltiazem  90 mg Oral Q6H   docusate sodium  200 mg Oral Daily   Ferrous Fumarate  1 tablet Oral Daily   And   folic acid-pyridoxine-cyancobalamin  1 tablet Oral Daily   pantoprazole  40 mg Oral Daily   sodium chloride flush  3 mL Intravenous Q12H   Continuous Infusions:  sodium chloride Stopped (08/04/22 1533)   amiodarone 30 mg/hr (08/09/22 0830)   PRN Meds: melatonin, metoprolol tartrate, morphine injection, naphazoline-glycerin, ondansetron (ZOFRAN) IV, mouth rinse, oxyCODONE, sodium chloride flush, traMADol   Vital Signs    Vitals:   08/09/22 0558 08/09/22 0600 08/09/22 0700 08/09/22 1122  BP: (!) 135/58 (!) 118/106 (!) 143/58   Pulse:  83 80   Resp:  (!) 21 (!) 24   Temp:    99 F (37.2 C)  TempSrc:    Axillary  SpO2:  95% 95%   Weight:      Height:        Intake/Output Summary (Last 24 hours) at 08/09/2022 1125 Last data filed at 08/09/2022 0800 Gross per 24 hour  Intake 873.43 ml  Output 920 ml  Net -46.57 ml      08/09/2022    5:00 AM 08/08/2022    5:00 AM 08/07/2022    5:30 AM  Last 3 Weights  Weight (lbs) 299 lb 2.6 oz 296 lb 15.4 oz 302 lb 0.5 oz  Weight (kg) 135.7 kg 134.7 kg 137 kg      Telemetry    Sinus rhythm  - Personally Reviewed  ECG    N/A today  - Personally Reviewed  Physical Exam   GEN: No acute distress.   Neck: No JVD Cardiac: RRR, no  murmurs, rubs, or gallops. Mid-sternum incision intact, no drainage  Respiratory: Clear to auscultation bilaterally. On room air.  GI: Soft, nontender MS: Mild RLE edema Neuro:  Nonfocal  Psych: Normal affect   Labs    High Sensitivity Troponin:  No results for input(s): "TROPONINIHS" in the last 720 hours.   Chemistry Recent Labs  Lab 08/04/22 0351 08/04/22 1653 08/05/22 0327 08/06/22 0447 08/07/22 0115 08/09/22 0433  NA 135 134*   < > 136 137 135  K 3.7 4.1   < > 3.8 3.9 3.8  CL 105 104   < > 104 102 102  CO2 21* 21*   < > '25 23 25  '$ GLUCOSE 145* 168*   < > 123* 125* 135*  BUN 15 19   < > '19 23 17  '$ CREATININE 1.00 1.06   < > 0.86 0.82 0.81  CALCIUM 7.6* 8.0*   < > 8.4* 8.7* 8.4*  MG 2.6* 2.6*  --   --  2.2  --   GFRNONAA >60 >60   < > >  60 >60 >60  ANIONGAP 9 9   < > '7 12 8   '$ < > = values in this interval not displayed.    Lipids  Recent Labs  Lab 08/07/22 0115  CHOL 88  TRIG 107  HDL 16*  LDLCALC 51  CHOLHDL 5.5    Hematology Recent Labs  Lab 08/06/22 0447 08/07/22 1232 08/08/22 0100  WBC 14.4* 14.5* 14.8*  RBC 2.59* 2.84* 2.90*  HGB 8.7* 9.6* 9.7*  HCT 25.5* 28.0* 29.3*  MCV 98.5 98.6 101.0*  MCH 33.6 33.8 33.4  MCHC 34.1 34.3 33.1  RDW 14.1 14.4 14.6  PLT 146* 245 254   Thyroid No results for input(s): "TSH", "FREET4" in the last 168 hours.  BNPNo results for input(s): "BNP", "PROBNP" in the last 168 hours.  DDimer No results for input(s): "DDIMER" in the last 168 hours.   Radiology    ECHO TEE  Result Date: 08/08/2022    TRANSESOPHOGEAL ECHO REPORT   Patient Name:   Hector Edwards [facility] Date of Exam: 08/08/2022 Medical Rec #:  [id number]        Height:       76.0 in Accession #:    [id number]       Weight:       297.0 lb Date of Birth:  1945/11/16       BSA:          2.620 m Patient Age:    76 years         BP:           119/73 mmHg Patient Gender: M                HR:           133 bpm. Exam Location:  Inpatient Procedure: Transesophageal Echo, Color  Doppler and Cardiac Doppler Indications:     I48.91* Unspecified atrial fibrillation  History:         Patient has prior history of Echocardiogram examinations, most                  recent 08/06/2022. Prior CABG; Risk Factors:Hypertension,                  Dyslipidemia and Sleep Apnea.  Sonographer:     Raquel Sarna Senior RDCS Referring Phys:  [id number] Shirley Friar Diagnosing Phys: Skeet Latch MD PROCEDURE: After discussion of the risks and benefits of a TEE, an informed consent was obtained from the patient. The transesophogeal probe was passed without difficulty through the esophogus of the patient. Sedation performed by different physician. The patient was monitored while under deep sedation. Anesthestetic sedation was provided intravenously by Anesthesiology: '326mg'$  of Propofol. The patient's vital signs; including heart rate, blood pressure, and oxygen saturation; remained stable throughout the procedure. The patient developed no complications during the procedure. A successful direct current cardioversion was performed at 200 joules with 1 attempt.  IMPRESSIONS  1. Left ventricular ejection fraction, by estimation, is 60 to 65%. The left ventricle has normal function. The left ventricle has no regional wall motion abnormalities.  2. Right ventricular systolic function is normal. The right ventricular size is normal.  3. Left atrial appendage is heavily trabeculated. Unchanged from intraoperative TEE. No left atrial/left atrial appendage thrombus was detected. The LAA emptying velocity was 99 cm/s.  4. The mitral valve is normal in structure. Trivial mitral valve regurgitation. No evidence of mitral stenosis.  5. The aortic valve is tricuspid. There is mild calcification  of the aortic valve. There is mild thickening of the aortic valve. Aortic valve regurgitation is trivial. Mild aortic valve stenosis. Aortic valve mean gradient measures 12.0 mmHg. Aortic valve Vmax measures 2.28 m/s.  6. There is  Moderate (Grade [name]) atheroma plaque.  7. The inferior vena cava is normal in size with greater than 50% respiratory variability, suggesting right atrial pressure of 3 mmHg.  8. Evidence of atrial level shunting detected by color flow Doppler. There is a small patent foramen ovale with predominantly left to right shunting across the atrial septum. Conclusion(s)/Recommendation(s): Normal biventricular function without evidence of hemodynamically significant valvular heart disease. FINDINGS  Left Ventricle: Left ventricular ejection fraction, by estimation, is 60 to 65%. The left ventricle has normal function. The left ventricle has no regional wall motion abnormalities. The left ventricular internal cavity size was normal in size. There is  no left ventricular hypertrophy. Right Ventricle: The right ventricular size is normal. No increase in right ventricular wall thickness. Right ventricular systolic function is normal. Left Atrium: Left atrial appendage is heavily trabeculated. Unchanged from intraoperative TEE. Left atrial size was normal in size. No left atrial/left atrial appendage thrombus was detected. The LAA emptying velocity was 99 cm/s. Right Atrium: Right atrial size was normal in size. Pericardium: There is no evidence of pericardial effusion. Mitral Valve: The mitral valve is normal in structure. Trivial mitral valve regurgitation. No evidence of mitral valve stenosis. Tricuspid Valve: The tricuspid valve is normal in structure. Tricuspid valve regurgitation is not demonstrated. No evidence of tricuspid stenosis. Aortic Valve: The aortic valve is tricuspid. There is mild calcification of the aortic valve. There is mild thickening of the aortic valve. Aortic valve regurgitation is trivial. Mild aortic stenosis is present. Aortic valve mean gradient measures 12.0 mmHg. Aortic valve peak gradient measures 20.8 mmHg. Pulmonic Valve: The pulmonic valve was normal in structure. Pulmonic valve regurgitation is  not visualized. No evidence of pulmonic stenosis. Aorta: The aortic root is normal in size and structure. There is moderate (Grade [name]) atheroma plaque. Venous: The inferior vena cava is normal in size with greater than 50% respiratory variability, suggesting right atrial pressure of 3 mmHg. IAS/Shunts: Evidence of atrial level shunting detected by color flow Doppler. A small patent foramen ovale is detected with predominantly left to right shunting across the atrial septum.  AORTIC VALVE AV Vmax:      228.00 cm/s AV Vmean:     160.000 cm/s AV VTI:       0.307 m AV Peak Grad: 20.8 mmHg AV Mean Grad: 12.0 mmHg Skeet Latch MD Electronically signed by Skeet Latch MD Signature Date/Time: 08/08/2022/4:47:44 PM    Final     Cardiac Studies   TEE 08/08/22:  1. Left ventricular ejection fraction, by estimation, is 60 to 65%. The  left ventricle has normal function. The left ventricle has no regional  wall motion abnormalities.   2. Right ventricular systolic function is normal. The right ventricular  size is normal.   3. Left atrial appendage is heavily trabeculated. Unchanged from  intraoperative TEE. No left atrial/left atrial appendage thrombus was  detected. The LAA emptying velocity was 99 cm/s.   4. The mitral valve is normal in structure. Trivial mitral valve  regurgitation. No evidence of mitral stenosis.   5. The aortic valve is tricuspid. There is mild calcification of the  aortic valve. There is mild thickening of the aortic valve. Aortic valve  regurgitation is trivial. Mild aortic valve stenosis. Aortic valve mean  gradient measures 12.0 mmHg. Aortic  valve Vmax measures 2.28 m/s.   6. There is Moderate (Grade [name]) atheroma plaque.   7. The inferior vena cava is normal in size with greater than 50%  respiratory variability, suggesting right atrial pressure of 3 mmHg.   8. Evidence of atrial level shunting detected by color flow Doppler.  There is a small patent foramen ovale  with predominantly left to right  shunting across the atrial septum.   Conclusion(s)/Recommendation(s): Normal biventricular function without  evidence of hemodynamically significant valvular heart disease.   LHC on 08/01/22:     Mid RCA to Dist RCA lesion is 90% stenosed.   Ost RCA to Prox RCA lesion is 80% stenosed.   RPAV lesion is 99% stenosed.   RPDA lesion is 80% stenosed.   Mid LM to Dist LM lesion is 80% stenosed.   Ost Cx to Prox Cx lesion is 70% stenosed.   Dist LM to Ost LAD lesion is 80% stenosed.   Prox LAD to Mid LAD lesion is 60% stenosed.   Mid LAD lesion is 80% stenosed.   Severe distal left main/three vessel CAD Severe, heavily calcified eccentric distal left main stenosis Severe ostial LAD stenosis. Severe mid LAD stenosis Severe ostial Circumflex stenosis.  Large dominant RCA with severe ostial stenosis. Severe mid to distal vessel stenosis. Severe stenosis in the proximal posterolateral artery. Severe ostial PDA stenosis.  LVEDP 18 mmHg   Recommendations: Severe left main and three vessel. Will admit to telemetry and consult CT surgery to review candidacy for bypass surgery. Will add statin, beta blocker and ASA.      Patient Profile     76 y.o. male with PMH of depression, GERD, obesity, HTN, HLD, sleep apnea, CAD, mild aortic stenosis, admitted for chest pain, LHC 08/01/22 showed severe LM and three vessel CAD, underwent CABG x3 with LIMA to LAD, RSVG to PDA, and RSVG to PLA on 08/03/22. Post op course complicated by A fib RVR, started on amiodarone and diltiazem and Eliquis, s/p TEE DCCV on 08/08/22, back to SR.   Assessment & Plan    Post op A fib RVR - mediated by surgery /stress  - LVEF preserved, no significant valvular disease  - back to SR since TEE/DCCV 08/08/22 - appreciate EP consult , recommend  transition amiodarone 400 mg twice daily for 2 weeks followed by 200 mg daily, unlikely will need long term amiodarone  - continue diltiazem '90mg'$  q6h,  transition to PO diltiazem CD 363m daily at discharge  -  continue Eliquis '5mg'$  BID   CAD s/p CABG x3  - s/p LIMA to LAD, RSVG to PDA, and RSVG to PLA on 08/03/22 - post op care per CT surgery recommendation  - medical therapy: ASA '81mg'$ , lipitor '80mg'$ , may add metoprolol XL '25mg'$  and resume benazepril if BP allows  - will arrange cardiology follow up when near discharge   HTN - on benazepril at home - can be resumed if BP allows   HLD - LDL 132, goal <70, started on lipitor '80mg'$   OSA - encourage CPAP      For questions or updates, please contact [place] consult [email or web address] for contact info under        Signed, XMargie Billet NP  08/09/2022, 11:25 AM    Case reviewed.  Patient has been seen by WElliot Cousintoday   No further recommendations.  PDorris CarnesMD

## 2022-08-09 NOTE — Progress Notes (Addendum)
Electrophysiology Rounding Note  Patient Name: Hector Edwards Date of Encounter: 08/09/2022  Primary Cardiologist: Lauree Chandler, MD Electrophysiologist: None   Subjective   Feeling better this am in NSR  Inpatient Medications    Scheduled Meds:  amiodarone  400 mg Oral BID   apixaban  5 mg Oral BID   aspirin EC  81 mg Oral Daily   Or   aspirin  81 mg Per Tube Daily   atorvastatin  80 mg Oral Daily   bisacodyl  10 mg Oral Daily   Or   bisacodyl  10 mg Rectal Daily   Chlorhexidine Gluconate Cloth  6 each Topical Q0600   diltiazem  90 mg Oral Q6H   docusate sodium  200 mg Oral Daily   Ferrous Fumarate  1 tablet Oral Daily   And   folic acid-pyridoxine-cyancobalamin  1 tablet Oral Daily   pantoprazole  40 mg Oral Daily   sodium chloride flush  3 mL Intravenous Q12H   Continuous Infusions:  sodium chloride Stopped (08/04/22 1533)   amiodarone     PRN Meds: melatonin, metoprolol tartrate, morphine injection, naphazoline-glycerin, ondansetron (ZOFRAN) IV, mouth rinse, oxyCODONE, sodium chloride flush, traMADol   Vital Signs    Vitals:   08/09/22 0500 08/09/22 0558 08/09/22 0600 08/09/22 0700  BP: (!) 135/58 (!) 135/58 (!) 118/106 (!) 143/58  Pulse: 79  83 80  Resp: (!) 23  (!) 21 (!) 24  Temp:      TempSrc:      SpO2: 93%  95% 95%  Weight: 135.7 kg     Height:        Intake/Output Summary (Last 24 hours) at 08/09/2022 0759 Last data filed at 08/09/2022 0700 Gross per 24 hour  Intake 1183.34 ml  Output 1025 ml  Net 158.34 ml   Filed Weights   08/07/22 0530 08/08/22 0500 08/09/22 0500  Weight: (!) 137 kg 134.7 kg 135.7 kg    Physical Exam    GEN- The patient is well appearing, alert and oriented x 3 today.   Head- normocephalic, atraumatic Eyes-  Sclera clear, conjunctiva pink Ears- hearing intact Oropharynx- clear Neck- supple Lungs- Clear to ausculation bilaterally, normal work of breathing Heart- Regular rate and rhythm, no murmurs,  rubs or gallops GI- soft, NT, ND, + BS Extremities- no clubbing or cyanosis. No edema Skin- no rash or lesion Psych- euthymic mood, full affect Neuro- strength and sensation are intact  Labs    CBC Recent Labs    08/07/22 1232 08/08/22 0100  WBC 14.5* 14.8*  HGB 9.6* 9.7*  HCT 28.0* 29.3*  MCV 98.6 101.0*  PLT 245 937   Basic Metabolic Panel Recent Labs    08/07/22 0115 08/09/22 0433  NA 137 135  K 3.9 3.8  CL 102 102  CO2 23 25  GLUCOSE 125* 135*  BUN 23 17  CREATININE 0.82 0.81  CALCIUM 8.7* 8.4*  MG 2.2  --    Liver Function Tests No results for input(s): "AST", "ALT", "ALKPHOS", "BILITOT", "PROT", "ALBUMIN" in the last 72 hours. No results for input(s): "LIPASE", "AMYLASE" in the last 72 hours. Cardiac Enzymes No results for input(s): "CKTOTAL", "CKMB", "CKMBINDEX", "TROPONINI" in the last 72 hours.   Telemetry    NSR 70-80s (personally reviewed)  Radiology    ECHO TEE  Result Date: 08/08/2022    TRANSESOPHOGEAL ECHO REPORT   Patient Name:   [name] [facility] Date of Exam: 08/08/2022 Medical Rec #:  [id number]  Height:       76.0 in Accession #:    [id number]       Weight:       297.0 lb Date of Birth:  Apr 29, 1946       BSA:          2.620 m Patient Age:    76 years         BP:           119/73 mmHg Patient Gender: M                HR:           133 bpm. Exam Location:  Inpatient Procedure: Transesophageal Echo, Color Doppler and Cardiac Doppler Indications:     I48.91* Unspecified atrial fibrillation  History:         Patient has prior history of Echocardiogram examinations, most                  recent 08/06/2022. Prior CABG; Risk Factors:Hypertension,                  Dyslipidemia and Sleep Apnea.  Sonographer:     Raquel Sarna Senior RDCS Referring Phys:  [id number] Shirley Friar Diagnosing Phys: Skeet Latch MD PROCEDURE: After discussion of the risks and benefits of a TEE, an informed consent was obtained from the patient. The transesophogeal probe  was passed without difficulty through the esophogus of the patient. Sedation performed by different physician. The patient was monitored while under deep sedation. Anesthestetic sedation was provided intravenously by Anesthesiology: '326mg'$  of Propofol. The patient's vital signs; including heart rate, blood pressure, and oxygen saturation; remained stable throughout the procedure. The patient developed no complications during the procedure. A successful direct current cardioversion was performed at 200 joules with 1 attempt.  IMPRESSIONS  1. Left ventricular ejection fraction, by estimation, is 60 to 65%. The left ventricle has normal function. The left ventricle has no regional wall motion abnormalities.  2. Right ventricular systolic function is normal. The right ventricular size is normal.  3. Left atrial appendage is heavily trabeculated. Unchanged from intraoperative TEE. No left atrial/left atrial appendage thrombus was detected. The LAA emptying velocity was 99 cm/s.  4. The mitral valve is normal in structure. Trivial mitral valve regurgitation. No evidence of mitral stenosis.  5. The aortic valve is tricuspid. There is mild calcification of the aortic valve. There is mild thickening of the aortic valve. Aortic valve regurgitation is trivial. Mild aortic valve stenosis. Aortic valve mean gradient measures 12.0 mmHg. Aortic valve Vmax measures 2.28 m/s.  6. There is Moderate (Grade [name]) atheroma plaque.  7. The inferior vena cava is normal in size with greater than 50% respiratory variability, suggesting right atrial pressure of 3 mmHg.  8. Evidence of atrial level shunting detected by color flow Doppler. There is a small patent foramen ovale with predominantly left to right shunting across the atrial septum. Conclusion(s)/Recommendation(s): Normal biventricular function without evidence of hemodynamically significant valvular heart disease. FINDINGS  Left Ventricle: Left ventricular ejection fraction, by  estimation, is 60 to 65%. The left ventricle has normal function. The left ventricle has no regional wall motion abnormalities. The left ventricular internal cavity size was normal in size. There is  no left ventricular hypertrophy. Right Ventricle: The right ventricular size is normal. No increase in right ventricular wall thickness. Right ventricular systolic function is normal. Left Atrium: Left atrial appendage is heavily trabeculated. Unchanged from intraoperative TEE. Left atrial size was  normal in size. No left atrial/left atrial appendage thrombus was detected. The LAA emptying velocity was 99 cm/s. Right Atrium: Right atrial size was normal in size. Pericardium: There is no evidence of pericardial effusion. Mitral Valve: The mitral valve is normal in structure. Trivial mitral valve regurgitation. No evidence of mitral valve stenosis. Tricuspid Valve: The tricuspid valve is normal in structure. Tricuspid valve regurgitation is not demonstrated. No evidence of tricuspid stenosis. Aortic Valve: The aortic valve is tricuspid. There is mild calcification of the aortic valve. There is mild thickening of the aortic valve. Aortic valve regurgitation is trivial. Mild aortic stenosis is present. Aortic valve mean gradient measures 12.0 mmHg. Aortic valve peak gradient measures 20.8 mmHg. Pulmonic Valve: The pulmonic valve was normal in structure. Pulmonic valve regurgitation is not visualized. No evidence of pulmonic stenosis. Aorta: The aortic root is normal in size and structure. There is moderate (Grade [name]) atheroma plaque. Venous: The inferior vena cava is normal in size with greater than 50% respiratory variability, suggesting right atrial pressure of 3 mmHg. IAS/Shunts: Evidence of atrial level shunting detected by color flow Doppler. A small patent foramen ovale is detected with predominantly left to right shunting across the atrial septum.  AORTIC VALVE AV Vmax:      228.00 cm/s AV Vmean:     160.000 cm/s AV  VTI:       0.307 m AV Peak Grad: 20.8 mmHg AV Mean Grad: 12.0 mmHg Skeet Latch MD Electronically signed by Skeet Latch MD Signature Date/Time: 08/08/2022/4:47:44 PM    Final     Patient Profile     TROYE HIEMSTRA is a 76 y.o. male with a history of depression, GERD, obesity, HTN, HLD, sleep apnea and recently diagnosed CAD and mild aortic stenosis who is being seen today for the evaluation of Post op AFL at the request of Dr. Cyndia Bent.     Assessment & Plan    1.  Post op atrial flutter / fib Maintaining NSR s/p TEE/DCC 11/1 Continue IV amiodarone until this evening, then transition to 400 mg BID x 5 days, then 200 mg BID.  Continue Eliquis.  Continue Diltiazem 90 mg q 6 hours, consolidate to long acting prior to d/c. Ok to down titrate as needed if becomes brady.    2. CAD s/p CABG x 3 Cath showed severe LM and 3 vessel disease Underwent CABG 08/03/2022 Otherwise post op course has gone well. Pressors off TEE 11/1 EF 60-65%  Plan to start po amio, and transition off IV this evening per Dr. Cyndia Bent.   EP Meziah Blasingame follow at a distance while remains here.  Jazlene Bares schedule follow up  For questions or updates, please contact [place] Please consult [email or web address] for contact info under Cardiology/STEMI.  Signed, Shirley Friar, PA-C  08/09/2022, 7:59 AM   I have seen and examined this patient with Oda Kilts.  Agree with above, note added to reflect my findings.  Patient is post cardioversion yesterday.  Currently feeling improved.  Less short of breath and fatigue.  GEN: Well nourished, well developed, in no acute distress  HEENT: normal  Neck: no JVD, carotid bruits, or masses Cardiac: RRR; no murmurs, rubs, or gallops,no edema  Respiratory:  clear to auscultation bilaterally, normal work of breathing GI: soft, nontender, nondistended, + BS MS: no deformity or atrophy  Skin: warm and dry Neuro:  Strength and sensation are intact Psych: euthymic mood, full  affect   Postoperative atrial fibrillation: Patient is currently on IV amiodarone.  Had a cardioversion yesterday.  Remains in sinus rhythm.  We Carlous Olivares switch to p.o. amiodarone today.  Darothy Courtright need 400 mg twice daily for 2 weeks followed by 200 mg daily.  Arrange for follow-up in A-fib clinic.  Ulonda Klosowski M. Darya Bigler MD 08/09/2022 9:41 AM

## 2022-08-09 NOTE — TOC Initial Note (Signed)
Transition of Care [facility]) - Initial/Assessment Note    Patient Details  Name: Hector Edwards MRN: [id number] Date of Birth: 11-Dec-1945  Transition of Care [facility]) CM/SW Contact:    Bethena Roys, RN Phone Number: 08/09/2022, 11:47 AM  Clinical Narrative: Patient was discussed in morning rounds-POD-6 CABG. PTA patient states he was from home alone. Patient states his home is 3 levels and no bedrooms are on the lower level. Patient reports that he has a ramp to his front door, five steps to enter the home and fourteen steps to his bedroom upstairs. Patient states he may have some friends move a bed downstairs. Hector Edwards was at the bedside during the conversation and she will assist the patient post transition home as well. Patient will benefit from PT/OT to see if he can manage the stairs. Case Manager will follow for DME needs as well.                  Expected Discharge Plan: [place] Barriers to Discharge: Continued Medical Work up   Patient Goals and CMS Choice Patient states their goals for this hospitalization and ongoing recovery are:: to return home.      Expected Discharge Plan and Services Expected Discharge Plan: [place] In-house Referral: NA Discharge Planning Services: CM Consult Post Acute Care Choice: [place] arrangements for the past 2 months: [place]                    Prior Living Arrangements/Services Living arrangements for the past 2 months: Single Family Home Lives with:: Self (States he has support of friends.) Patient language and need for interpreter reviewed:: Yes Do you feel safe going back to the place where you live?: Yes      Need for Family Participation in Patient Care: Yes (Comment) Care giver support system in place?: Yes (comment)   Criminal Activity/Legal Involvement Pertinent to Current Situation/Hospitalization: No - Comment as needed  Activities of Daily  Living Home Assistive Devices/Equipment: Hearing aid ADL Screening (condition at time of admission) Patient's cognitive ability adequate to safely complete daily activities?: Yes Is the patient deaf or have difficulty hearing?: Yes Does the patient have difficulty seeing, even when wearing glasses/contacts?: No Does the patient have difficulty concentrating, remembering, or making decisions?: No Patient able to express need for assistance with ADLs?: Yes Does the patient have difficulty dressing or bathing?: No Independently performs ADLs?: Yes (appropriate for developmental age) Does the patient have difficulty walking or climbing stairs?: No Weakness of Legs: None Weakness of Arms/Hands: None  Permission Sought/Granted Permission sought to share information with : Family Supports, Case Manager   Emotional Assessment Appearance:: Appears stated age Attitude/Demeanor/Rapport: Engaged Affect (typically observed): Appropriate Orientation: : Oriented to Situation, Oriented to  Time, Oriented to Self, Oriented to Place Alcohol / Substance Use: Not Applicable Psych Involvement: No (comment)  Admission diagnosis:  Unstable angina ([place]) [I20.0] S/P CABG x 3 [Z95.1] Patient Active Problem List   Diagnosis Date Noted   Persistent atrial fibrillation ([place])    S/P CABG x 3 08/03/2022   Coronary artery disease involving native coronary artery of native heart with unstable angina pectoris ([place])    Class 2 severe obesity with serious comorbidity and body mass index (BMI) of 37.0 to 37.9 in adult, unspecified obesity type ([place]) 10/27/2020   OSA (obstructive sleep apnea) 08/01/2018   Benign prostatic hyperplasia with nocturia 11/26/2016   Prostate cancer  screening 11/26/2016   Medicare annual wellness visit, subsequent 11/26/2016   Hx of adenomatous colonic polyps 09/04/2015   Annual physical exam 07/20/2015   Visit for preventive health examination 05/04/2013   Erectile dysfunction  05/04/2013   Snoring 02/13/2012   GERD (gastroesophageal reflux disease) 08/12/2011   Osteoarthritis 08/08/2011   Hyperlipidemia 10/13/2007   Essential hypertension 10/13/2007   PCP:  [name], FNP Pharmacy:   [facility] - [place], [place] - [address] [place] [address] Phone: [phone] Fax: [phone]  [facility] [address] [place] [address] Phone: [phone] Fax: [phone]  Readmission Risk Interventions     No data to display

## 2022-08-09 NOTE — Progress Notes (Signed)
1 Day Post-Op Procedure(s) (LRB): TRANSESOPHAGEAL ECHOCARDIOGRAM (TEE) (N/A) CARDIOVERSION (N/A) Subjective: No complaints. Ambulated this am. Had BM.  Objective: Vital signs in last 24 hours: Temp:  [97.4 F (36.3 C)-98.4 F (36.9 C)] 98.3 F (36.8 C) (11/02 0325) Pulse Rate:  [76-128] 80 (11/02 0700) Cardiac Rhythm: Normal sinus rhythm (11/02 0400) Resp:  [13-29] 24 (11/02 0700) BP: (118-154)/(57-106) 143/58 (11/02 0700) SpO2:  [89 %-98 %] 95 % (11/02 0700) Weight:  [135.7 kg] 135.7 kg (11/02 0500)  Hemodynamic parameters for last 24 hours:    Intake/Output from previous day: 11/01 0701 - 11/02 0700 In: 1183.3 [P.O.:660; I.V.:523.3] Out: 1025 [Urine:1025] Intake/Output this shift: No intake/output data recorded.  General appearance: alert and cooperative Neurologic: intact Heart: regular rate and rhythm Lungs: clear to auscultation bilaterally Extremities: mild edema in right leg from vein harvest, incision ok Wound: incision healing well  Lab Results: Recent Labs    08/07/22 1232 08/08/22 0100  WBC 14.5* 14.8*  HGB 9.6* 9.7*  HCT 28.0* 29.3*  PLT 245 254   BMET:  Recent Labs    08/07/22 0115 08/09/22 0433  NA 137 135  K 3.9 3.8  CL 102 102  CO2 23 25  GLUCOSE 125* 135*  BUN 23 17  CREATININE 0.82 0.81  CALCIUM 8.7* 8.4*    PT/INR:  Recent Labs    08/08/22 0100  LABPROT 15.3*  INR 1.2   ABG    Component Value Date/Time   PHART 7.342 (L) 08/03/2022 2048   HCO3 19.6 (L) 08/03/2022 2048   TCO2 21 (L) 08/03/2022 2048   ACIDBASEDEF 5.0 (H) 08/03/2022 2048   O2SAT 97 08/03/2022 2048   CBG (last 3)  Recent Labs    08/06/22 2314 08/07/22 0806 08/07/22 1135  GLUCAP 113* 187* 113*    Assessment/Plan: S/P Procedure(s) (LRB): TRANSESOPHAGEAL ECHOCARDIOGRAM (TEE) (N/A) CARDIOVERSION (N/A)  POD 6 CABG  He has been hemodynamically stable and maintaining sinus 70's since DCCV yesterday. EF 60-65% on TEE yesterday. Mild AS with mean  gradient 12 mm Hg. Continue Cardizem and start po amio. Will continue IV amio until this evening. Hold off on beta blocker at this time. I think cardizem will be a better drug with atrial flutter. Continue Eliquis and ASA 81.   Transfer to 4E and continue mobilization. He needs more ambulation before he goes home.     LOS: 8 days    Gaye Pollack 08/09/2022

## 2022-08-09 NOTE — Progress Notes (Signed)
Pt declined ambulation, attempted education but pt is tired. Will f/u later.

## 2022-08-10 ENCOUNTER — Inpatient Hospital Stay (HOSPITAL_COMMUNITY): Payer: Medicare HMO

## 2022-08-10 LAB — CBC
HCT: 28.5 % — ABNORMAL LOW (ref 39.0–52.0)
Hemoglobin: 9.4 g/dL — ABNORMAL LOW (ref 13.0–17.0)
MCH: 33.9 pg (ref 26.0–34.0)
MCHC: 33 g/dL (ref 30.0–36.0)
MCV: 102.9 fL — ABNORMAL HIGH (ref 80.0–100.0)
Platelets: 262 10*3/uL (ref 150–400)
RBC: 2.77 MIL/uL — ABNORMAL LOW (ref 4.22–5.81)
RDW: 15.2 % (ref 11.5–15.5)
WBC: 10.1 10*3/uL (ref 4.0–10.5)
nRBC: 0 % (ref 0.0–0.2)

## 2022-08-10 LAB — BASIC METABOLIC PANEL
Anion gap: 8 (ref 5–15)
Anion gap: 11 (ref 5–15)
BUN: 16 mg/dL (ref 8–23)
BUN: 15 mg/dL (ref 8–23)
CO2: 24 mmol/L (ref 22–32)
CO2: 22 mmol/L (ref 22–32)
Calcium: 8.3 mg/dL — ABNORMAL LOW (ref 8.9–10.3)
Calcium: 8.4 mg/dL — ABNORMAL LOW (ref 8.9–10.3)
Chloride: 103 mmol/L (ref 98–111)
Chloride: 101 mmol/L (ref 98–111)
Creatinine, Ser: 0.85 mg/dL (ref 0.61–1.24)
Creatinine, Ser: 0.76 mg/dL (ref 0.61–1.24)
GFR, Estimated: >60 mL/min (ref >60)
GFR, Estimated: >60 mL/min (ref >60)
Glucose, Bld: 138 mg/dL — ABNORMAL HIGH (ref 70–99)
Glucose, Bld: 131 mg/dL — ABNORMAL HIGH (ref 70–99)
Potassium: 3.5 mmol/L (ref 3.5–5.1)
Potassium: 3.6 mmol/L (ref 3.5–5.1)
Sodium: 135 mmol/L (ref 135–145)
Sodium: 134 mmol/L — ABNORMAL LOW (ref 135–145)

## 2022-08-10 MED ORDER — SODIUM CHLORIDE 0.9% FLUSH: 3.0000 mL | INTRAVENOUS | Status: DC | PRN

## 2022-08-10 MED ORDER — ASPIRIN 81 MG PO TBEC
81.0000 mg | DELAYED_RELEASE_TABLET | Freq: Every day | ORAL | Status: DC
  Administered 2022-08-10 – 2022-08-18 (×9): 81 mg via ORAL
  Filled 2022-08-10 (×9): qty 1

## 2022-08-10 MED ORDER — BENAZEPRIL HCL 5 MG PO TABS
20.0000 mg | ORAL_TABLET | Freq: Every day | ORAL | Status: DC
  Administered 2022-08-10 – 2022-08-17 (×8): 20 mg via ORAL
  Filled 2022-08-10 (×8): qty 4

## 2022-08-10 MED ORDER — SODIUM CHLORIDE 0.9% FLUSH
3.0000 mL | Freq: Two times a day (BID) | INTRAVENOUS | Status: DC
  Administered 2022-08-10 – 2022-08-18 (×15): 3 mL via INTRAVENOUS

## 2022-08-10 MED ORDER — SODIUM CHLORIDE 0.9 % IV SOLN: 250.0000 mL | INTRAVENOUS | Status: DC | PRN

## 2022-08-10 MED ORDER — ~~LOC~~ CARDIAC SURGERY, PATIENT & FAMILY EDUCATION: Freq: Once | Status: AC

## 2022-08-10 MED ORDER — DILTIAZEM HCL ER COATED BEADS 180 MG PO CP24
360.0000 mg | ORAL_CAPSULE | Freq: Every day | ORAL | Status: DC
  Administered 2022-08-10 – 2022-08-14 (×5): 360 mg via ORAL
  Filled 2022-08-10 (×5): qty 2

## 2022-08-10 NOTE — Progress Notes (Signed)
Mobility Specialist Progress Note:   08/10/22 1039  Mobility  Activity Ambulated with assistance in hallway  Level of Assistance Standby assist, set-up cues, supervision of patient - no hands on  Assistive Device None  Distance Ambulated (ft) 550 ft  Activity Response Tolerated well  Mobility Referral Yes  $Mobility charge 1 Mobility   Pt received in bed willing to participate in mobility. Complaints of incisional pain. Left in bed with call bell in reach and all needs met.   During Mobility: 95 HR Post Mobility: 80 HR; 97% SpO2  Geneticist, molecular Chat only

## 2022-08-10 NOTE — Care Management Important Message (Signed)
Important Message  Patient Details  Name: Hector Edwards MRN: [id number] Date of Birth: 20-Feb-1946   Medicare Important Message Given:  Yes     Shelda Altes 08/10/2022, 9:02 AM

## 2022-08-10 NOTE — Progress Notes (Signed)
CARDIAC REHAB PHASE I   PRE:  Rate/Rhythm: 81 NSR  BP:  Sitting: 142/63      SaO2: 97 RA  MODE:  Ambulation: 360 ft   POST:  Rate/Rhythm: 97 NSR  BP:  Sitting: 173/71      SaO2: 97 RA  Pt needed verbal cues to stay in tube when getting to EOB. Pt visibly sob, while ambulating, unsteady at times. Pt believes he's steadier when walking at moderate pace vs slow pace. Pt was returned to room coached on pursed lip breathing and fell asleep prior to my departure.   Hector Edwards  1:41 PM 08/10/2022

## 2022-08-10 NOTE — Progress Notes (Signed)
Seen pt after ambulation for education, pt fell asleep, left materials (ex guidelines, heart healthy, Move in the Tube). Will ambulate with pt in the afternoon.   Hector Edwards 10:49 AM 08/10/2022

## 2022-08-10 NOTE — Progress Notes (Addendum)
      [address]       [place] [address]             [phone]      2 Days Post-Op Procedure(s) (LRB): TRANSESOPHAGEAL ECHOCARDIOGRAM (TEE) (N/A) CARDIOVERSION (N/A) Subjective: Patient states he has some chest soreness especially with rolling onto his side at night but otherwise has no new complaints.  Objective: Vital signs in last 24 hours: Temp:  [98 F (36.7 C)-99 F (37.2 C)] 98 F (36.7 C) (11/03 0351) Pulse Rate:  [79-87] 79 (11/03 0351) Cardiac Rhythm: Normal sinus rhythm;Bundle branch block;Heart block (11/02 1900) Resp:  [17-24] 17 (11/03 0351) BP: (128-164)/(57-99) 128/65 (11/03 0351) SpO2:  [94 %-97 %] 94 % (11/03 0351) Weight:  [134.4 kg] 134.4 kg (11/03 0351)  Hemodynamic parameters for last 24 hours:    Intake/Output from previous day: 11/02 0701 - 11/03 0700 In: 540.1 [P.O.:360; I.V.:180.1] Out: 1160 [Urine:1160] Intake/Output this shift: No intake/output data recorded.  General appearance: alert, cooperative, and no distress Neurologic: intact Heart: regular rate and rhythm, S1, S2 normal, no murmur, click, rub or gallop Lungs: diminished breath sounds bibasilar Abdomen: soft, non-tender; bowel sounds normal; no masses,  no organomegaly Extremities: extremities normal, atraumatic, no cyanosis or edema Wound: Clean, dry, intact  Lab Results: Recent Labs    08/07/22 1232 08/08/22 0100  WBC 14.5* 14.8*  HGB 9.6* 9.7*  HCT 28.0* 29.3*  PLT 245 254   BMET:  Recent Labs    08/09/22 0433 08/10/22 0111  NA 135 135  K 3.8 3.5  CL 102 103  CO2 25 24  GLUCOSE 135* 138*  BUN 17 16  CREATININE 0.81 0.85  CALCIUM 8.4* 8.3*    PT/INR:  Recent Labs    08/08/22 0100  LABPROT 15.3*  INR 1.2   ABG    Component Value Date/Time   PHART 7.342 (L) 08/03/2022 2048   HCO3 19.6 (L) 08/03/2022 2048   TCO2 21 (L) 08/03/2022 2048   ACIDBASEDEF 5.0 (H) 08/03/2022 2048   O2SAT 97 08/03/2022 2048   CBG (last 3)  Recent Labs     08/07/22 0806 08/07/22 1135  GLUCAP 187* 113*    Assessment/Plan: S/P Procedure(s) (LRB): TRANSESOPHAGEAL ECHOCARDIOGRAM (TEE) (N/A) CARDIOVERSION (N/A)  CV: Hx of afib/aflutter, POD2 DCCV. In NSR, 1st degree heart block. HR 79. On cardizem '90mg'$  Q6 and PO amiodarone '400mg'$  BID. Will switch cardizem to long acting. On eliquis '5mg'$  BID. SBP 128. Continue holding beta blocker.  Pulm: Saturating 94% on RA. Sats drop at night due to hx of sleep apnea. CXR on 10/28 showed bibasilar atelectasis and left sided pleural effusion. Will get CXR today. Continue IS and ambulation. GI: +BM yesterday. Endo: Nondiabetic, preop A1C 5.4. sugars well controlled, CBGs have been D/C'd Renal: At preop weight, hold diuretic. Cr stable Expected postop ABLA: stable H/H 9.7/29.3 Deconditioning: Has not ambulated much since surgery. Will get PT/OT consult. Dispo: Patient lives alone in 3 story home. Minimal ambulation since surgery. Will get PT/OT consult and work on ambulation before discharge.    LOS: 9 days    [name], PA-C 08/10/2022   Chart reviewed, patient examined, agree with above. Rhythm has been stable in sinus. Wt is below preop. CXR today shows tiny bilateral pleural effusions that are insignificant. He is not ambulating that well yet. He lost a couple days when he was in atrial flutter with RVR and with morbid obesity it has been slow.

## 2022-08-10 NOTE — Progress Notes (Signed)
Follow up with cardiology arranged on 08/23/22

## 2022-08-11 MED ORDER — AMIODARONE HCL 200 MG PO TABS: 200.0000 mg | ORAL_TABLET | Freq: Every day | ORAL | Status: DC

## 2022-08-11 NOTE — Evaluation (Signed)
Occupational Therapy Evaluation Patient Details Name: Hector Edwards MRN: [id number] DOB: August 13, 1946 Today's Date: 08/11/2022   History of Present Illness S/p median sternotomy for CABGx3  with Rt greater saphenous vein and Lt internal mammary artery on 28/41/32 with complications of Afib in RVR following surgery and underwent TEE with CV on 08/08/22   Clinical Impression   Pt independent at baseline with ADLs and functional mobility, lives alone but has family/friends available to assist at d/c. Pt needing min guard-mod A for ADLs, supervision-min A for bed mobility,and min A for transfers without AD. Reviewed sternal precautions and ADL compensatory strategies (handout provided), pt needing mod-max cues to maintain precautions, especially with bed mobility. Pt  with difficulty performing LB dressing, would benefit from AE trial in next session. Pt presenting with impairments listed below, will follow acutely. Recommend HHOT at d/c pending progression.     Recommendations for follow up therapy are one component of a multi-disciplinary discharge planning process, led by the attending physician.  Recommendations may be updated based on patient status, additional functional criteria and insurance authorization.   Follow Up Recommendations  Home health OT    Assistance Recommended at Discharge Intermittent Supervision/Assistance  Patient can return home with the following A little help with walking and/or transfers;A lot of help with bathing/dressing/bathroom;Assistance with cooking/housework;Direct supervision/assist for medications management;Direct supervision/assist for financial management;Assist for transportation;Help with stairs or ramp for entrance    Functional Status Assessment  Patient has had a recent decline in their functional status and demonstrates the ability to make significant improvements in function in a reasonable and predictable amount of time.  Equipment  Recommendations  BSC/3in1    Recommendations for Other Services PT consult     Precautions / Restrictions Precautions Precautions: Fall;Sternal Precaution Booklet Issued: Yes (comment) Restrictions Weight Bearing Restrictions: Yes Other Position/Activity Restrictions: sternal precautions      Mobility Bed Mobility Overal bed mobility: Needs Assistance Bed Mobility: Sidelying to Sit, Sit to Sidelying   Sidelying to sit: Min assist     Sit to sidelying: Supervision General bed mobility comments: max cues for technique/adhering to precautions    Transfers Overall transfer level: Needs assistance Equipment used: None Transfers: Sit to/from Stand Sit to Stand: Min assist           General transfer comment: cues to keep hands  on knees      Balance Overall balance assessment: Mild deficits observed, not formally tested                                         ADL either performed or assessed with clinical judgement   ADL Overall ADL's : Needs assistance/impaired Eating/Feeding: Supervision/ safety   Grooming: Minimal assistance   Upper Body Bathing: Moderate assistance   Lower Body Bathing: Moderate assistance   Upper Body Dressing : Moderate assistance   Lower Body Dressing: Moderate assistance   Toilet Transfer: Min guard;Ambulation;Regular Museum/gallery exhibitions officer and Hygiene: Minimal assistance Toileting - Clothing Manipulation Details (indicate cue type and reason): educated on wiping from front for BM     Functional mobility during ADLs: Min guard       Vision   Vision Assessment?: No apparent visual deficits     Perception     Praxis      Pertinent Vitals/Pain Pain Assessment Pain Assessment: No/denies pain     Hand Dominance Right  Extremity/Trunk Assessment Upper Extremity Assessment Upper Extremity Assessment: Overall WFL for tasks assessed (within limits of sternal precautions)   Lower  Extremity Assessment Lower Extremity Assessment: Defer to PT evaluation   Cervical / Trunk Assessment Cervical / Trunk Assessment: Normal   Communication Communication Communication: No difficulties   Cognition Arousal/Alertness: Awake/alert Behavior During Therapy: Flat affect Overall Cognitive Status: Within Functional Limits for tasks assessed                                 General Comments: pt with decreased attention to task, needing mod-max cues for sternal precautions     General Comments  VSS on RA    Exercises     Shoulder Instructions      Home Living Family/patient expects to be discharged to:: Private residence Living Arrangements: Alone Available Help at Discharge: Family;Friend(s) Type of Home: House       Home Layout: Two level;Able to live on main level with bedroom/bathroom     Bathroom Shower/Tub: Teacher, early years/pre: Standard Bathroom Accessibility: Yes   Home Equipment: Cane - single point          Prior Functioning/Environment Prior Level of Function : Independent/Modified Independent;Driving                        OT Problem List: Decreased strength;Decreased range of motion;Decreased activity tolerance;Impaired balance (sitting and/or standing);Decreased safety awareness;Decreased knowledge of precautions      OT Treatment/Interventions: Self-care/ADL training;Therapeutic exercise;Energy conservation;DME and/or AE instruction;Therapeutic activities;Patient/family education;Balance training    OT Goals(Current goals can be found in the care plan section) Acute Rehab OT Goals Patient Stated Goal: none stated OT Goal Formulation: With patient Time For Goal Achievement: 08/25/22 Potential to Achieve Goals: Good ADL Goals Pt Will Perform Upper Body Dressing: with supervision;sitting Pt Will Perform Lower Body Dressing: with supervision;sitting/lateral leans;sit to/from stand;with adaptive  equipment Pt Will Perform Toileting - Clothing Manipulation and hygiene: with supervision;with adaptive equipment;sitting/lateral leans;sit to/from stand Pt Will Perform Tub/Shower Transfer: Shower transfer;Tub transfer;with supervision;ambulating  OT Frequency: Min 2X/week    Co-evaluation              AM-PAC OT "6 Clicks" Daily Activity     Outcome Measure Help from another person eating meals?: None Help from another person taking care of personal grooming?: A Little Help from another person toileting, which includes using toliet, bedpan, or urinal?: A Little Help from another person bathing (including washing, rinsing, drying)?: A Lot Help from another person to put on and taking off regular upper body clothing?: A Lot Help from another person to put on and taking off regular lower body clothing?: A Lot 6 Click Score: 16   End of Session Equipment Utilized During Treatment: Gait belt Nurse Communication: Mobility status  Activity Tolerance: Patient tolerated treatment well Patient left: in bed;with call bell/phone within reach;with bed alarm set;with family/visitor present  OT Visit Diagnosis: Unsteadiness on feet (R26.81);Other abnormalities of gait and mobility (R26.89);Muscle weakness (generalized) (M62.81)                Time: 3300-7622 OT Time Calculation (min): 22 min Charges:  OT General Charges $OT Visit: 1 Visit OT Evaluation $OT Eval Moderate Complexity: 1 [address] OTD, OTR/L Acute Rehab [phone] [phone] - [place] 08/11/2022, 12:40 PM

## 2022-08-11 NOTE — Progress Notes (Signed)
CARDIAC REHAB PHASE I   Pt to be followed by PT/OT, pt tired and didn't want to walk. Reviewed education from yesterday and sent referral to [facility] in [facility]. Reinforced using the heart pillow to cough and move around and IS for lung health, pt demonstrated while in the room. Pt left in the bed with call bell in reach and family in the room. Pt verbalize understanding and all questions were answered.  [phone] Kirby Funk ACSM-CEP 08/11/2022 11:27 AM

## 2022-08-11 NOTE — Progress Notes (Signed)
      [address]       ,[place] [address]             [phone]    8 Days Post-Op CABG x 3  3 Days Post-Op TRANSESOPHAGEAL ECHOCARDIOGRAM AND DC CARDIOVERSION  Subjective: Patient resting in bed. Says he feels he is progressing and  has no new complaints.  Has not had PT/OT evaluations yet.  Objective: Vital signs in last 24 hours: Temp:  [97.4 F (36.3 C)-99.1 F (37.3 C)] 97.7 F (36.5 C) (11/04 0749) Pulse Rate:  [74-82] 79 (11/04 0749) Cardiac Rhythm: Normal sinus rhythm (11/04 0823) Resp:  [17-25] 20 (11/04 0749) BP: (131-144)/(53-68) 135/63 (11/04 0749) SpO2:  [93 %-96 %] 94 % (11/04 0749) Weight:  [134 kg] 134 kg (11/04 0348)     Intake/Output from previous day: 11/03 0701 - 11/04 0700 In: 600 [P.O.:600] Out: 675 [Urine:675] Intake/Output this shift: Total I/O In: -  Out: 75 [Urine:75]  General appearance: alert, cooperative, and no distress Neurologic: intact Heart: regular rate and rhythm Lungs: Breath sounds are clear, no dyspnea on RA Abdomen: soft, non-tender Extremities:  no LE edema Wound: Clean, dry, intact  Lab Results: Recent Labs    08/10/22 0859  WBC 10.1  HGB 9.4*  HCT 28.5*  PLT 262    BMET:  Recent Labs    08/10/22 0111 08/10/22 0859  NA 135 134*  K 3.5 3.6  CL 103 101  CO2 24 22  GLUCOSE 138* 131*  BUN 16 15  CREATININE 0.85 0.76  CALCIUM 8.3* 8.4*     PT/INR:  No results for input(s): "LABPROT", "INR" in the last 72 hours.  ABG    Component Value Date/Time   PHART 7.342 (L) 08/03/2022 2048   HCO3 19.6 (L) 08/03/2022 2048   TCO2 21 (L) 08/03/2022 2048   ACIDBASEDEF 5.0 (H) 08/03/2022 2048   O2SAT 97 08/03/2022 2048   CBG (last 3)  No results for input(s): "GLUCAP" in the last 72 hours.   Assessment/Plan: S/P Procedure(s) (LRB): TRANSESOPHAGEAL ECHOCARDIOGRAM (TEE) (N/A) CARDIOVERSION (N/A)  CV: Hx of afib/aflutter, POD3 DCCV. In NSR, 1st degree heart block. HR 79. On cardizem,  amiodarone, and eliqui.  Pulm: Saturating 94% on RA. CXR yesterday without significant effusions or airspace disease.   GI: tolerating PO's and having appropriate bowel function  Endo: Nondiabetic, preop A1C 5.4. sugars well controlled, CBGs have been D/C'd  Renal: At preop weight and Cr stable. No further diuresis  Expected postop ABLA: stable H/H 9.7/29.3  Deconditioning: Has not ambulated much since surgery.  PT/OT consults pending for discharge planning. Anticipate he will be ready for discharge tomorrow.    LOS: 10 days    Antony Odea, [place] [phone] 08/11/2022

## 2022-08-11 NOTE — Evaluation (Signed)
Physical Therapy Evaluation Patient Details Name: Hector Edwards MRN: [id number] DOB: [birth year] Today's Date: 08/11/2022  History of Present Illness  S/p median sternotomy for CABGx3  with Rt greater saphenous vein and Lt internal mammary artery on 67/67/20 with complications of Afib in RVR following surgery and underwent TEE with CV on 08/08/22    Clinical Impression  Hector Edwards is 76 y.o. male admitted with above HPI and diagnosis. Patient is currently limited by functional impairments below (see PT problem list). Patient lives alone and has assist from friends/family as needed and is independent at baseline. He requires max cues to maintain sternal precautions during mobility and min guard/assist for bed mobility, transfers, and gait. HR stable during mobility in 80's-90's throughout. Patient will benefit from continued skilled PT interventions to address impairments and progress independence with mobility, recommending HHPT. Acute PT will follow and progress as able.        Recommendations for follow up therapy are one component of a multi-disciplinary discharge planning process, led by the attending physician.  Recommendations may be updated based on patient status, additional functional criteria and insurance authorization.  PT Recommendation   Follow Up Recommendations Home health PT Filed 08/11/2022 9470  Assistance recommended at discharge Intermittent Supervision/Assistance Filed 08/11/2022 0853  Patient can return home with the following A little help with walking and/or transfers, A little help with bathing/dressing/bathroom, Assistance with cooking/housework, Assist for transportation, Help with stairs or ramp for entrance [facility] 08/11/2022 0853  Functional Status Assessment Patient has had a recent decline in their functional status and demonstrates the ability to make significant improvements in function in a reasonable and predictable amount of time. Filed 08/11/2022 0853   PT equipment None recommended by PT [facility] 08/11/2022 0853   Precautions / Restrictions Precautions Precautions: Fall;Sternal Precaution Booklet Issued: Yes (comment) Restrictions Other Position/Activity Restrictions: sternal precautions      Mobility  Bed Mobility Overal bed mobility: Needs Assistance Bed Mobility: Rolling, Sidelying to Sit, Sit to Sidelying Rolling: Min guard Sidelying to sit: Min assist, HOB elevated     Sit to sidelying: Min guard General bed mobility comments: pt required repeated Max cues to "move in teh tube" and maintain sternal precautions with bed mobility. Guarding to roll and assist to fully raise trunk upright. EOS pt required to supine with min guard and cues for safety.    Transfers Overall transfer level: Needs assistance Equipment used: None Transfers: Sit to/from Stand Sit to Stand: Min guard           General transfer comment: cues for sternal precautions to maintain hands on knees for power up, pt diregarding cues and hands on EOB to rise. Pt verbalized understanding of correct technique with education once standing.    Ambulation/Gait Ambulation/Gait assistance: Min guard, Supervision Gait Distance (Feet): 450 Feet Assistive device: None Gait Pattern/deviations: Step-through pattern, Decreased stride length, Wide base of support Gait velocity: decr     General Gait Details: overall pt steady with wide BOS and low hip/knee flexion resulting in low foot clearance. no overt LOB. slightly antalgic step pattern on Rt LE as gait progressed.  Stairs            Wheelchair Mobility    Modified Rankin (Stroke Patients Only)       Balance Overall balance assessment: Mild deficits observed, not formally tested  Pertinent Vitals/Pain Pain Assessment Pain Assessment: No/denies pain    Home Living Family/patient expects to be discharged to:: Private residence Living  Arrangements: Alone Available Help at Discharge: Family;Friend(s) Type of Home: House         Home Layout: Two level;Able to live on main level with bedroom/bathroom Home Equipment: Kasandra Knudsen - single point      Prior Function Prior Level of Function : Independent/Modified Independent;Driving                     Hand Dominance   Dominant Hand: Right    Extremity/Trunk Assessment   Upper Extremity Assessment Upper Extremity Assessment: Overall WFL for tasks assessed (within limits of sternal precautions)    Lower Extremity Assessment Lower Extremity Assessment: Defer to PT evaluation    Cervical / Trunk Assessment Cervical / Trunk Assessment: Normal  Communication   Communication: No difficulties  Cognition Arousal/Alertness: Awake/alert Behavior During Therapy: Flat affect Overall Cognitive Status: Within Functional Limits for tasks assessed                                 General Comments: pt with decreased awarenss to task and poor awareness for safety with sternal cues. repeated cues required.        General Comments General comments (skin integrity, edema, etc.): VSS on RA    Exercises       08/11/22 0853  PT - End of Session  Equipment Utilized During Treatment Gait belt  Activity Tolerance Patient tolerated treatment well  Patient left in bed;with call bell/phone within reach  Nurse Communication Mobility status  PT Assessment  PT Recommendation/Assessment Patient needs continued PT services  PT Visit Diagnosis Muscle weakness (generalized) (M62.81);Difficulty in walking, not elsewhere classified (R26.2)  PT Problem List Decreased strength;Decreased range of motion;Decreased activity tolerance;Decreased balance;Decreased mobility;Decreased cognition;Decreased knowledge of use of DME;Decreased safety awareness;Cardiopulmonary status limiting activity;Decreased knowledge of precautions;Obesity  PT Plan  PT Frequency (ACUTE ONLY) Min  3X/week  PT Treatment/Interventions (ACUTE ONLY) DME instruction;Gait training;Stair training;Functional mobility training;Therapeutic activities;Therapeutic exercise;Balance training;Patient/family education  AM-PAC PT "6 Clicks" Mobility Outcome Measure (Version 2)  Help needed turning from your back to your side while in a flat bed without using bedrails? 3  Help needed moving from lying on your back to sitting on the side of a flat bed without using bedrails? 3  Help needed moving to and from a bed to a chair (including a wheelchair)? 3  Help needed standing up from a chair using your arms (e.g., wheelchair or bedside chair)? 3  Help needed to walk in hospital room? 3  Help needed climbing 3-5 steps with a railing?  3  6 Click Score 18  Consider Recommendation of Discharge To: Home with [facility]  Progressive Mobility  What is the highest level of mobility based on the progressive mobility assessment? Level 5 (Walks with assist in room/hall) - Balance while stepping forward/back and can walk in room with assist - Complete  Mobility Referral Yes  Activity Ambulated with assistance in hallway  PT Recommendation  Follow Up Recommendations Home health PT  Assistance recommended at discharge Intermittent Supervision/Assistance  Patient can return home with the following A little help with walking and/or transfers;A little help with bathing/dressing/bathroom;Assistance with cooking/housework;Assist for transportation;Help with stairs or ramp for entrance  Functional Status Assessment Patient has had a recent decline in their functional status and demonstrates the ability to make significant  improvements in function in a reasonable and predictable amount of time.  PT equipment None recommended by PT  Individuals Consulted  Consulted and Agree with Results and Recommendations Patient  Acute Rehab PT Goals  Patient Stated Goal get home and regain independence  PT Goal Formulation With patient  Time For  Goal Achievement 08/25/22  Potential to Achieve Goals Good  PT Time Calculation  PT Start Time (ACUTE ONLY) 0850  PT Stop Time (ACUTE ONLY) 0911  PT Time Calculation (min) (ACUTE ONLY) 21 min  PT General Charges  $$ ACUTE PT VISIT 1 Visit  PT Evaluation  $PT Eval Moderate Complexity 1 Mod     Verner Mould, DPT Acute Rehabilitation Services Office [phone]  08/11/22 1:21 PM

## 2022-08-11 NOTE — Progress Notes (Signed)
Mobility Specialist - Progress Note   08/11/22 1324  Mobility  Activity Ambulated with assistance in hallway  Level of Assistance Standby assist, set-up cues, supervision of patient - no hands on  Assistive Device None  Distance Ambulated (ft) 500 ft  Activity Response Tolerated well  $Mobility charge 1 Mobility    Pt received in bed agreeable to mobility. Left sitting EOB w/ call bell in reach and all needs met.   Paulla Dolly Mobility Specialist

## 2022-08-12 ENCOUNTER — Encounter (HOSPITAL_COMMUNITY): Payer: Self-pay | Admitting: Cardiovascular Disease

## 2022-08-12 ENCOUNTER — Inpatient Hospital Stay (HOSPITAL_COMMUNITY): Payer: Medicare HMO

## 2022-08-12 LAB — BASIC METABOLIC PANEL
Anion gap: 10 (ref 5–15)
BUN: 15 mg/dL (ref 8–23)
CO2: 24 mmol/L (ref 22–32)
Calcium: 8.9 mg/dL (ref 8.9–10.3)
Chloride: 100 mmol/L (ref 98–111)
Creatinine, Ser: 0.78 mg/dL (ref 0.61–1.24)
GFR, Estimated: >60 mL/min (ref >60)
Glucose, Bld: 142 mg/dL — ABNORMAL HIGH (ref 70–99)
Potassium: 3.6 mmol/L (ref 3.5–5.1)
Sodium: 134 mmol/L — ABNORMAL LOW (ref 135–145)

## 2022-08-12 LAB — CBC
HCT: 31.5 % — ABNORMAL LOW (ref 39.0–52.0)
Hemoglobin: 10.1 g/dL — ABNORMAL LOW (ref 13.0–17.0)
MCH: 33.1 pg (ref 26.0–34.0)
MCHC: 32.1 g/dL (ref 30.0–36.0)
MCV: 103.3 fL — ABNORMAL HIGH (ref 80.0–100.0)
Platelets: 367 10*3/uL (ref 150–400)
RBC: 3.05 MIL/uL — ABNORMAL LOW (ref 4.22–5.81)
RDW: 15.2 % (ref 11.5–15.5)
WBC: 13 10*3/uL — ABNORMAL HIGH (ref 4.0–10.5)
nRBC: 0 % (ref 0.0–0.2)

## 2022-08-12 MED ORDER — CEFDINIR 300 MG PO CAPS: 300.0000 mg | ORAL_CAPSULE | Freq: Two times a day (BID) | ORAL | 0 refills | Status: DC

## 2022-08-12 MED ORDER — BENAZEPRIL HCL 20 MG PO TABS: 20.0000 mg | ORAL_TABLET | Freq: Every day | ORAL | 2 refills | Status: DC

## 2022-08-12 MED ORDER — CEFDINIR 300 MG PO CAPS
300.0000 mg | ORAL_CAPSULE | Freq: Two times a day (BID) | ORAL | Status: DC
  Administered 2022-08-12 – 2022-08-18 (×12): 300 mg via ORAL
  Filled 2022-08-12 (×15): qty 1

## 2022-08-12 MED ORDER — APIXABAN 5 MG PO TABS: 5.0000 mg | ORAL_TABLET | Freq: Two times a day (BID) | ORAL | 3 refills | Status: DC

## 2022-08-12 MED ORDER — ATORVASTATIN CALCIUM 80 MG PO TABS: 80.0000 mg | ORAL_TABLET | Freq: Every day | ORAL | 3 refills | Status: DC

## 2022-08-12 MED ORDER — ASPIRIN 81 MG PO TBEC: 81.0000 mg | DELAYED_RELEASE_TABLET | Freq: Every day | ORAL | 12 refills | Status: DC

## 2022-08-12 MED ORDER — DILTIAZEM HCL ER COATED BEADS 360 MG PO CP24: 360.0000 mg | ORAL_CAPSULE | Freq: Every day | ORAL | 3 refills | Status: DC

## 2022-08-12 MED ORDER — TRAMADOL HCL 50 MG PO TABS: 50.0000 mg | ORAL_TABLET | Freq: Four times a day (QID) | ORAL | 0 refills | Status: AC | PRN

## 2022-08-12 MED ORDER — AMIODARONE HCL 200 MG PO TABS: 200.0000 mg | ORAL_TABLET | Freq: Every day | ORAL | 2 refills | Status: DC

## 2022-08-12 MED ORDER — AMIODARONE HCL 400 MG PO TABS: 400.0000 mg | ORAL_TABLET | Freq: Two times a day (BID) | ORAL | 0 refills | Status: DC

## 2022-08-12 NOTE — Progress Notes (Signed)
Occupational Therapy Treatment Patient Details Name: Hector Edwards MRN: [id number] DOB: [birth year] Today's Date: 08/12/2022   History of present illness S/p median sternotomy for CABGx3  with Rt greater saphenous vein and Lt internal mammary artery on 35/00/93 with complications of Afib in RVR following surgery and underwent TEE with CV on 08/08/22   OT comments  Pt with slow progression towards goals, needing max cues to adhere to sternal precautions during bed mobility and UB dressing. Pt needing mod A for UB and LB dressing this session when performed with button up shirt. Pt declining education or use of AE for LB dressing, stating he does not typically wear socks, however is able to reach towards feet to simulate donning of pants. Pt's nephew present during session, reports they plan to have family stay with pt the first night or so at d/c to assist. Pt presenting with impairments listed below, will follow acutely. Recommend HHOT at d/c.   Recommendations for follow up therapy are one component of a multi-disciplinary discharge planning process, led by the attending physician.  Recommendations may be updated based on patient status, additional functional criteria and insurance authorization.    Follow Up Recommendations  Home health OT    Assistance Recommended at Discharge Intermittent Supervision/Assistance  Patient can return home with the following  A little help with walking and/or transfers;A lot of help with bathing/dressing/bathroom;Assistance with cooking/housework;Direct supervision/assist for medications management;Direct supervision/assist for financial management;Assist for transportation;Help with stairs or ramp for entrance   Equipment Recommendations  BSC/3in1    Recommendations for Other Services PT consult    Precautions / Restrictions Precautions Precautions: Fall;Sternal Precaution Booklet Issued: Yes (comment) Restrictions Weight Bearing Restrictions:  Yes Other Position/Activity Restrictions: sternal precautions       Mobility Bed Mobility Overal bed mobility: Needs Assistance Bed Mobility: Sidelying to Sit   Sidelying to sit: Mod assist       General bed mobility comments: mod cues for safety/following precautions    Transfers Overall transfer level: Needs assistance Equipment used: None Transfers: Sit to/from Stand Sit to Stand: Min assist           General transfer comment: cues to keep hands  on knees     Balance Overall balance assessment: Mild deficits observed, not formally tested                                         ADL either performed or assessed with clinical judgement   ADL Overall ADL's : Needs assistance/impaired                 Upper Body Dressing : Moderate assistance;Sitting Upper Body Dressing Details (indicate cue type and reason): max cues to adhere to sternal precautions Lower Body Dressing: Moderate assistance Lower Body Dressing Details (indicate cue type and reason): can reach down towards feet to simulate donning pants, declining education/trial of AE Toilet Transfer: Min guard;Ambulation;Regular Toilet           Functional mobility during ADLs: Min guard      Extremity/Trunk Assessment Upper Extremity Assessment Upper Extremity Assessment: Overall WFL for tasks assessed (within limits of sternal precautions)   Lower Extremity Assessment Lower Extremity Assessment: Defer to PT evaluation        Vision   Vision Assessment?: No apparent visual deficits   Perception Perception Perception: Not tested   Praxis Praxis Praxis: Not tested  Cognition Arousal/Alertness: Awake/alert Behavior During Therapy: Flat affect Overall Cognitive Status: Within Functional Limits for tasks assessed                                 General Comments: pt with decreased attention to task, needing mod-max cues for sternal precautions and overall  awareness of deficits        Exercises      Shoulder Instructions       General Comments VSS On RA    Pertinent Vitals/ Pain       Pain Assessment Pain Assessment: No/denies pain  Home Living                                          Prior Functioning/Environment              Frequency  Min 2X/week        Progress Toward Goals  OT Goals(current goals can now be found in the care plan section)  Progress towards OT goals: Progressing toward goals  Acute Rehab OT Goals Patient Stated Goal: none stated OT Goal Formulation: With patient Time For Goal Achievement: 08/25/22 Potential to Achieve Goals: Good ADL Goals Pt Will Perform Upper Body Dressing: with supervision;sitting Pt Will Perform Lower Body Dressing: with supervision;sitting/lateral leans;sit to/from stand;with adaptive equipment Pt Will Perform Toileting - Clothing Manipulation and hygiene: with supervision;with adaptive equipment;sitting/lateral leans;sit to/from stand Pt Will Perform Tub/Shower Transfer: Shower transfer;Tub transfer;with supervision;ambulating  Plan Discharge plan remains appropriate;Frequency remains appropriate    Co-evaluation                 AM-PAC OT "6 Clicks" Daily Activity     Outcome Measure   Help from another person eating meals?: None Help from another person taking care of personal grooming?: A Little Help from another person toileting, which includes using toliet, bedpan, or urinal?: A Little Help from another person bathing (including washing, rinsing, drying)?: A Lot Help from another person to put on and taking off regular upper body clothing?: A Lot Help from another person to put on and taking off regular lower body clothing?: A Lot 6 Click Score: 16    End of Session Equipment Utilized During Treatment: Gait belt  OT Visit Diagnosis: Unsteadiness on feet (R26.81);Other abnormalities of gait and mobility (R26.89);Muscle weakness  (generalized) (M62.81)   Activity Tolerance Patient tolerated treatment well   Patient Left in bed;with call bell/phone within reach;with bed alarm set;with family/visitor present (seated EOB)   Nurse Communication Mobility status        Time: 0762-2633 OT Time Calculation (min): 22 min  Charges: OT General Charges $OT Visit: 1 Visit OT Treatments $Self Care/Home Management : 8-22 mins  Lynnda Child, OTD, OTR/L Acute Rehab [phone]) [phone] - [place] 08/12/2022, 2:51 PM

## 2022-08-12 NOTE — TOC Transition Note (Addendum)
Transition of Care (TOC) - CM/SW Discharge Note Marvetta Gibbons RN, BSN Transitions of Care Unit 4E- RN Case Manager See Treatment Team for direct phone #   Patient Details  Name: Hector Edwards MRN: [id number] Date of Birth: 11-03-1945  Transition of Care [facility]) CM/SW Contact:  Dawayne Patricia, RN Phone Number: 08/12/2022, 10:16 AM   Clinical Narrative:    Pt stable for transition home today, Orders placed for DME-3n1 and HHPT/OT.  CM spoke with pt at bedside- discussed transition needs- pt agreeable to [facility] services- List provided for [facility] choice- Per CMS guidelines from [email or web address] website with star ratings (copy placed in shadow chart), CM assisted pt with review of list and which agencies work with his insurance- pt has Associate Professor for [facility] needs.  Pt reports he has installed high toilets at home, and bathrooms are close to bedrooms- 3n1 offered and pt declined need for one at this time.   Address, phone # and PCP confirmed with pt, pt reports he has transportation home.   Call made to [facility] for HHPT/OT referral- CM has received confirmation from [place] w/ [facility] that referral has been accepted and anticipate start of care visit on Tuesday 11/7.   No further TOC needs noted.   1520- spoke with pt and nephew at bedside after PT worked w/ pt on stair training. Per nephew pt does have 1/2 bath on first level of home and family has brought bed down to the first level, pt also has lift chair that he was using to sleep in prior to admit that is on first level of home. Nephew agrees that pt most likely does not need 3n1 as bathroom close and has high toilet. Per OT pt can do sponge bathing until HHPT/OT feel pt can safely navigate the stairs at home. Pt/nephew asking about an adjustable bed for home- (not hospital bed)- discussed that was something that was purchased out of pocket if desired.  Agree that pt might benefit from additional stair training here prior to  discharge, will defer this to attending team.    Final next level of care: [place] Barriers to Discharge: Barriers Resolved   Patient Goals and CMS Choice Patient states their goals for this hospitalization and ongoing recovery are:: to return home. CMS Medicare.gov Compare Post Acute Care list provided to:: Patient Choice offered to / list presented to : Patient  Discharge Placement               Home w/ [facility]        Discharge Plan and Services In-house Referral: NA Discharge Planning Services: CM Consult Post Acute Care Choice: Home Health          DME Arranged: 3-N-1, Patient refused services DME Agency: NA       HH Arranged: PT, OT HH Agency: [place] Date [place]: 08/12/22 Time [place]: [place] Representative spoke with at [place]: [place] Determinants of Health ([place]) Interventions     Readmission Risk Interventions    08/12/2022   10:16 AM  Readmission Risk Prevention Plan  Post Dischage Appt Complete  Medication Screening Complete  Transportation Screening Complete

## 2022-08-12 NOTE — Progress Notes (Signed)
Patient had episode of a-fib overnight with heart rate staying in low 100's. Pt asymptomatic and vital signs stable.

## 2022-08-12 NOTE — Progress Notes (Signed)
Physical Therapy Treatment Patient Details Name: Hector Edwards MRN: [id number] DOB: 10-19-1945 Today's Date: 08/12/2022   History of Present Illness Patient is 76 y.o. male S/p median sternotomy for CABGx3  with Rt greater saphenous vein and Lt internal mammary artery on 32/95/18 with complications of Afib in RVR following surgery and underwent TEE with CV on 08/08/22. PMH significant for depression, GERD, HTN, HLD, low back pain, OSA.    PT Comments    Pt progressing towards physical therapy goals. Focus of session was stair training. Pt appeared pre-syncopal at top of stairs and rollator was brought up behind him for a seated rest in the stair well. VSS, however pt sweating, with slurred speech and appeared very shaky with labored breathing and difficulty keeping eyes open at times. Pt's breathing and speech returned to normal and pt was able to descend the stairs well with +2 assist mainly for safety. Treatment team secure chat requesting another day to work with therapies and assess response to stair training tomorrow before pt is d/c home. Will continue to follow.    Recommendations for follow up therapy are one component of a multi-disciplinary discharge planning process, led by the attending physician.  Recommendations may be updated based on patient status, additional functional criteria and insurance authorization.  Follow Up Recommendations  Home health PT     Assistance Recommended at Discharge Frequent or constant Supervision/Assistance  Patient can return home with the following A little help with walking and/or transfers;A little help with bathing/dressing/bathroom;Assistance with cooking/housework;Assist for transportation;Help with stairs or ramp for entrance   Equipment Recommendations  None recommended by PT    Recommendations for Other Services       Precautions / Restrictions Precautions Precautions: Fall;Sternal Precaution Booklet Issued: Yes  (comment) Precaution Comments: Poor adherence to precautions, requires frequent cues. Restrictions Weight Bearing Restrictions: Yes Other Position/Activity Restrictions: sternal precautions     Mobility  Bed Mobility Overal bed mobility: Needs Assistance Bed Mobility: Sit to Sidelying, Rolling Rolling: Supervision       Sit to sidelying: Supervision General bed mobility comments: mod cues for safety/following precautions    Transfers Overall transfer level: Needs assistance Equipment used: None Transfers: Sit to/from Stand Sit to Stand: Min assist, Min guard           General transfer comment: Light assist to power up to full stand. VC's to push from knees instead of wide support on the bed.    Ambulation/Gait Ambulation/Gait assistance: Min guard Gait Distance (Feet): 150 Feet Assistive device: None Gait Pattern/deviations: Step-through pattern, Decreased stride length, Wide base of support, Drifts right/left Gait velocity: Decreased Gait velocity interpretation: 1.31 - 2.62 ft/sec, indicative of limited community ambulator   General Gait Details: Mildly unsteady and impulsive at times during gait training. Requires cues for general safety and maintenance of precautions.   Stairs Stairs: Yes Stairs assistance: Min assist, +2 safety/equipment Stair Management: One rail Right, Step to pattern, Alternating pattern, Forwards Number of Stairs: 10 General stair comments: Pt impulsive to advance stairs despite therapist's cues to slow down. Vitals were grossly stable when we got to the top of the stairs (BP 168/74, HR 80's, O2 95%) however pt appeared pre-syncopal, was sweating profusely, with labored breathing and slurred speech. We sat for ~8 minutes, speech and breathing returned to normal, however pt still sweating.  +2 for safety with descent down the stairs.   Wheelchair Mobility    Modified Rankin (Stroke Patients Only)       Balance  Overall balance  assessment: Mild deficits observed, not formally tested                                          Cognition Arousal/Alertness: Awake/alert Behavior During Therapy: Flat affect, Impulsive Overall Cognitive Status: Within Functional Limits for tasks assessed                                 General Comments: pt with decreased attention to task, needing mod-max cues for sternal precautions and overall decreased awareness of deficits        Exercises      General Comments General comments (skin integrity, edema, etc.): VSS On RA      Pertinent Vitals/Pain Pain Assessment Pain Assessment: Faces Faces Pain Scale: No hurt Pain Intervention(s): Monitored during session    Home Living                          Prior Function            PT Goals (current goals can now be found in the care plan section) Acute Rehab PT Goals Patient Stated Goal: get home and regain independence PT Goal Formulation: With patient/family Time For Goal Achievement: 08/25/22 Potential to Achieve Goals: Good Progress towards PT goals: Progressing toward goals    Frequency    Min 3X/week      PT Plan Current plan remains appropriate    Co-evaluation              AM-PAC PT "6 Clicks" Mobility   Outcome Measure  Help needed turning from your back to your side while in a flat bed without using bedrails?: A Little Help needed moving from lying on your back to sitting on the side of a flat bed without using bedrails?: A Little Help needed moving to and from a bed to a chair (including a wheelchair)?: A Little Help needed standing up from a chair using your arms (e.g., wheelchair or bedside chair)?: A Little Help needed to walk in hospital room?: A Little Help needed climbing 3-5 steps with a railing? : A Little 6 Click Score: 18    End of Session Equipment Utilized During Treatment: Gait belt Activity Tolerance: Patient tolerated treatment  well Patient left: in bed;with call bell/phone within reach Nurse Communication: Mobility status PT Visit Diagnosis: Muscle weakness (generalized) (M62.81);Difficulty in walking, not elsewhere classified (R26.2)     Time: 1610-9604 PT Time Calculation (min) (ACUTE ONLY): 28 min  Charges:  $Gait Training: 23-37 mins                     Rolinda Roan, PT, DPT Acute Rehabilitation Services Secure Chat Preferred Office: [phone]    Thelma Comp 08/12/2022, 3:44 PM

## 2022-08-12 NOTE — Progress Notes (Signed)
      [address]       [place] [address]             [phone]    8 Days Post-Op CABG x 3  4 Days Post-Op TRANSESOPHAGEAL ECHOCARDIOGRAM AND DC CARDIOVERSION  Subjective: Patient resting in bed and says he feels well. Walked in the hall twice yesterday. No complaints. He would like to return home today.   Had rate-controlled a-fib for about 4 hours early this morning without any symptoms, now back in SR.   Objective: Vital signs in last 24 hours: Temp:  [97.6 F (36.4 C)-98.7 F (37.1 C)] 97.9 F (36.6 C) (11/05 0329) Pulse Rate:  [78-111] 111 (11/05 0329) Cardiac Rhythm: Heart block;Bundle branch block (11/05 0805) Resp:  [20] 20 (11/05 0329) BP: (122-149)/(57-76) 122/76 (11/05 0329) SpO2:  [90 %-94 %] 90 % (11/05 0329) Weight:  [134.1 kg] 134.1 kg (11/05 0329)     Intake/Output from previous day: 11/04 0701 - 11/05 0700 In: 240 [P.O.:240] Out: 325 [Urine:325] Intake/Output this shift: No intake/output data recorded.  General appearance: alert, cooperative, and no distress Neurologic: intact Heart: regular rate and rhythm, a-fib earlier this morning, back in SR. Lungs: Breath sounds are clear, no dyspnea on RA Abdomen: soft, non-tender Extremities:  no LE edema Wound: Clean, dry, intact  Lab Results: Recent Labs    08/10/22 0859 08/12/22 0121  WBC 10.1 13.0*  HGB 9.4* 10.1*  HCT 28.5* 31.5*  PLT 262 367    BMET:  Recent Labs    08/10/22 0859 08/12/22 0121  NA 134* 134*  K 3.6 3.6  CL 101 100  CO2 22 24  GLUCOSE 131* 142*  BUN 15 15  CREATININE 0.76 0.78  CALCIUM 8.4* 8.9     PT/INR:  No results for input(s): "LABPROT", "INR" in the last 72 hours.  ABG    Component Value Date/Time   PHART 7.342 (L) 08/03/2022 2048   HCO3 19.6 (L) 08/03/2022 2048   TCO2 21 (L) 08/03/2022 2048   ACIDBASEDEF 5.0 (H) 08/03/2022 2048   O2SAT 97 08/03/2022 2048   CBG (last 3)  No results for input(s): "GLUCAP" in the last 72  hours.   Assessment/Plan: S/P Procedure(s) (LRB): TRANSESOPHAGEAL ECHOCARDIOGRAM (TEE) (N/A) CARDIOVERSION (N/A)  CV: Hx of afib/aflutter, POD4 DCCV. Had another episode of a-fib this morning with controlled V-rate. On cardizem, amiodarone, and eliquis.  Pulm: No dyspnea on RA.   GI: tolerating PO's and having appropriate bowel function  Endo: Nondiabetic, preop A1C 5.4. sugars well controlled, CBGs have been D/C'd  Renal: At preop weight and Cr stable. No further diuresis  Expected postop ABLA: Improving H/H, now 10/31  Phlebitis- has developed superficial phlebitis in the left arm above and below the IV site. Suspect this was related to amiodarone infusion. Advised to use warm compresses 3 x daily for the next 5 days. Will also treat with abx for 1 week. (Cefdinir). Follow up in the office.   Deconditioning: Mobility improving.   PT/OT consults appreciated. Home PT / OT ordered as recommended. Also ordered a BSC.  Plan for discharge today.    LOS: 11 days    Antony Odea, [place] [phone] 08/12/2022

## 2022-08-12 NOTE — Progress Notes (Signed)
Mobility Specialist - Progress Note   08/12/22 1049  Mobility  Activity Ambulated with assistance in hallway  Level of Assistance Contact guard assist, steadying assist  Assistive Device None  Distance Ambulated (ft) 300 ft  Activity Response Tolerated well  $Mobility charge 1 Mobility    Pt received in bed and agreeable to mobility. Sat in chair while waiting for bedding to be changed before laying back down. Left in bed w/ call bell in reach and all needs met.   Paulla Dolly Mobility Specialist

## 2022-08-12 NOTE — Progress Notes (Signed)
Skin around Pt's PIV in left forearm has become red and firm.  Pt denies any pain.  PIV removed and redness outlined. PA paged.

## 2022-08-12 NOTE — Progress Notes (Signed)
Pt had episode of SOB shortly after mobility session.  Pt denies any chest pain or dizziness. Vitals taken and within normal range. Neuro assessment normal.  Pt focused on distraction and SOB resolved.  PA paged.

## 2022-08-13 ENCOUNTER — Inpatient Hospital Stay (HOSPITAL_COMMUNITY): Payer: Medicare HMO

## 2022-08-13 ENCOUNTER — Ambulatory Visit: Payer: Medicare HMO | Admitting: Cardiovascular Disease

## 2022-08-13 DIAGNOSIS — I4819 Other persistent atrial fibrillation: Secondary | ICD-10-CM

## 2022-08-13 DIAGNOSIS — Z951 Presence of aortocoronary bypass graft: Secondary | ICD-10-CM | POA: Diagnosis not present

## 2022-08-13 LAB — CBC
HCT: 31.2 % — ABNORMAL LOW (ref 39.0–52.0)
Hemoglobin: 9.9 g/dL — ABNORMAL LOW (ref 13.0–17.0)
MCH: 32.4 pg (ref 26.0–34.0)
MCHC: 31.7 g/dL (ref 30.0–36.0)
MCV: 102 fL — ABNORMAL HIGH (ref 80.0–100.0)
Platelets: 446 10*3/uL — ABNORMAL HIGH (ref 150–400)
RBC: 3.06 MIL/uL — ABNORMAL LOW (ref 4.22–5.81)
RDW: 15 % (ref 11.5–15.5)
WBC: 15.8 10*3/uL — ABNORMAL HIGH (ref 4.0–10.5)
nRBC: 0 % (ref 0.0–0.2)

## 2022-08-13 LAB — BASIC METABOLIC PANEL
Anion gap: 10 (ref 5–15)
BUN: 16 mg/dL (ref 8–23)
CO2: 23 mmol/L (ref 22–32)
Calcium: 8.8 mg/dL — ABNORMAL LOW (ref 8.9–10.3)
Chloride: 100 mmol/L (ref 98–111)
Creatinine, Ser: 0.89 mg/dL (ref 0.61–1.24)
GFR, Estimated: >60 mL/min (ref >60)
Glucose, Bld: 177 mg/dL — ABNORMAL HIGH (ref 70–99)
Potassium: 3.5 mmol/L (ref 3.5–5.1)
Sodium: 133 mmol/L — ABNORMAL LOW (ref 135–145)

## 2022-08-13 LAB — ECHOCARDIOGRAM LIMITED
Area-P 1/2: 5.54 cm2
Calc EF: 65.8 %
Height: 76 in
S' Lateral: 2.8 cm
Single Plane A2C EF: 64.7 %
Single Plane A4C EF: 64.8 %
Weight: 4731.2 oz

## 2022-08-13 MED ORDER — TORSEMIDE 20 MG PO TABS
40.0000 mg | ORAL_TABLET | Freq: Every day | ORAL | Status: DC
  Administered 2022-08-13 – 2022-08-14 (×2): 40 mg via ORAL
  Filled 2022-08-13 (×2): qty 2

## 2022-08-13 MED ORDER — POTASSIUM CHLORIDE CRYS ER 20 MEQ PO TBCR
20.0000 meq | EXTENDED_RELEASE_TABLET | Freq: Two times a day (BID) | ORAL | Status: DC
  Administered 2022-08-13: 20 meq via ORAL
  Filled 2022-08-13: qty 1

## 2022-08-13 MED ORDER — PERFLUTREN LIPID MICROSPHERE
1.0000 mL | INTRAVENOUS | Status: AC | PRN
  Administered 2022-08-13: 2 mL via INTRAVENOUS

## 2022-08-13 MED ORDER — POTASSIUM CHLORIDE CRYS ER 20 MEQ PO TBCR
20.0000 meq | EXTENDED_RELEASE_TABLET | Freq: Three times a day (TID) | ORAL | Status: DC
  Administered 2022-08-13 – 2022-08-14 (×5): 20 meq via ORAL
  Filled 2022-08-13 (×5): qty 1

## 2022-08-13 MED ORDER — FUROSEMIDE 10 MG/ML IJ SOLN
40.0000 mg | Freq: Once | INTRAMUSCULAR | Status: AC
  Administered 2022-08-13: 40 mg via INTRAVENOUS
  Filled 2022-08-13: qty 4

## 2022-08-13 MED ORDER — FUROSEMIDE 40 MG PO TABS
40.0000 mg | ORAL_TABLET | Freq: Every day | ORAL | Status: DC
  Administered 2022-08-13: 40 mg via ORAL
  Filled 2022-08-13 (×2): qty 1

## 2022-08-13 NOTE — Progress Notes (Incomplete)
Cardiology Progress Note  Patient ID: MILTON SAGONA MRN: [id number] DOB: 31-Mar-1946 Date of Encounter: 08/13/2022  Primary Cardiologist: Lauree Chandler, MD  Subjective   Chief Complaint: SOB  HPI: SOB this AM. Back in Afib.  Chest x-ray with pneumonia versus pulmonary edema.  Lasix ordered by surgical team.  ROS:  All other ROS reviewed and negative. Pertinent positives noted in the HPI.     Inpatient Medications  Scheduled Meds: . [START ON 08/23/2022] amiodarone  200 mg Oral Daily  . amiodarone  400 mg Oral BID  . apixaban  5 mg Oral BID  . aspirin EC  81 mg Oral Daily  . atorvastatin  80 mg Oral Daily  . benazepril  20 mg Oral QHS  . cefdinir  300 mg Oral Q12H  . diltiazem  360 mg Oral Daily  . Ferrous Fumarate  1 tablet Oral Daily   And  . folic acid-pyridoxine-cyancobalamin  1 tablet Oral Daily  . furosemide  40 mg Intravenous Once  . furosemide  40 mg Oral Daily  . pantoprazole  40 mg Oral Daily  . potassium chloride  20 mEq Oral BID  . sodium chloride flush  3 mL Intravenous Q12H   Continuous Infusions: . sodium chloride     PRN Meds: sodium chloride, melatonin, naphazoline-glycerin, ondansetron (ZOFRAN) IV, mouth rinse, oxyCODONE, sodium chloride flush, traMADol   Vital Signs   Vitals:   08/12/22 1948 08/13/22 0004 08/13/22 0401 08/13/22 0829  BP: (!) 151/72 111/65 (!) 130/59 123/71  Pulse: 82 86 97 (!) 113  Resp: '20 20 20 20  '$ Temp: 97.8 F (36.6 C) 98.9 F (37.2 C) 97.9 F (36.6 C) 97.6 F (36.4 C)  TempSrc: Oral Axillary Oral Oral  SpO2: 100% 95% 96% 95%  Weight:      Height:        Intake/Output Summary (Last 24 hours) at 08/13/2022 0950 Last data filed at 08/13/2022 0830 Gross per 24 hour  Intake 360 ml  Output 975 ml  Net -615 ml      08/12/2022    3:29 AM 08/11/2022    3:48 AM 08/10/2022    3:51 AM  Last 3 Weights  Weight (lbs) 295 lb 11.2 oz 295 lb 6.7 oz 296 lb 4.8 oz  Weight (kg) 134.129 kg 134 kg 134.4 kg       Telemetry  Overnight telemetry shows Afib 100s, which I personally reviewed.   ECG  The most recent ECG shows ***, which I personally reviewed.   Physical Exam   Vitals:   08/12/22 1948 08/13/22 0004 08/13/22 0401 08/13/22 0829  BP: (!) 151/72 111/65 (!) 130/59 123/71  Pulse: 82 86 97 (!) 113  Resp: '20 20 20 20  '$ Temp: 97.8 F (36.6 C) 98.9 F (37.2 C) 97.9 F (36.6 C) 97.6 F (36.4 C)  TempSrc: Oral Axillary Oral Oral  SpO2: 100% 95% 96% 95%  Weight:      Height:        Intake/Output Summary (Last 24 hours) at 08/13/2022 0950 Last data filed at 08/13/2022 0830 Gross per 24 hour  Intake 360 ml  Output 975 ml  Net -615 ml       08/12/2022    3:29 AM 08/11/2022    3:48 AM 08/10/2022    3:51 AM  Last 3 Weights  Weight (lbs) 295 lb 11.2 oz 295 lb 6.7 oz 296 lb 4.8 oz  Weight (kg) 134.129 kg 134 kg 134.4 kg    Body mass index  is 35.99 kg/m.  General: Well nourished, well developed, in no acute distress Head: Atraumatic, normal size  Eyes: PEERLA, EOMI  Neck: Supple, no JVD Endocrine: No thryomegaly Cardiac: Normal S1, S2; RRR; no murmurs, rubs, or gallops Lungs: Clear to auscultation bilaterally, no wheezing, rhonchi or rales  Abd: Soft, nontender, no hepatomegaly  Ext: No edema, pulses 2+ Musculoskeletal: No deformities, BUE and BLE strength normal and equal Skin: Warm and dry, no rashes   Neuro: Alert and oriented to person, place, time, and situation, CNII-XII grossly intact, no focal deficits  Psych: Normal mood and affect   Labs  High Sensitivity Troponin:  No results for input(s): "TROPONINIHS" in the last 720 hours.   Cardiac EnzymesNo results for input(s): "TROPONINI" in the last 168 hours. No results for input(s): "TROPIPOC" in the last 168 hours.  Chemistry Recent Labs  Lab 08/10/22 0859 08/12/22 0121 08/13/22 0057  NA 134* 134* 133*  K 3.6 3.6 3.5  CL 101 100 100  CO2 '22 24 23  '$ GLUCOSE 131* 142* 177*  BUN '15 15 16  '$ CREATININE 0.76 0.78 0.89   CALCIUM 8.4* 8.9 8.8*  GFRNONAA >60 >60 >60  ANIONGAP '11 10 10    '$ Hematology Recent Labs  Lab 08/10/22 0859 08/12/22 0121 08/13/22 0057  WBC 10.1 13.0* 15.8*  RBC 2.77* 3.05* 3.06*  HGB 9.4* 10.1* 9.9*  HCT 28.5* 31.5* 31.2*  MCV 102.9* 103.3* 102.0*  MCH 33.9 33.1 32.4  MCHC 33.0 32.1 31.7  RDW 15.2 15.2 15.0  PLT 262 367 446*   BNPNo results for input(s): "BNP", "PROBNP" in the last 168 hours.  DDimer No results for input(s): "DDIMER" in the last 168 hours.   Radiology  DG Chest 2 View  Result Date: 08/12/2022 CLINICAL DATA:  Shortness of breath on exertion EXAM: CHEST - 2 VIEW COMPARISON:  Previous studies including the examination of 08/10/2022 FINDINGS: Transverse diameter of heart is increased. Central pulmonary vessels are more prominent. Bilateral pleural effusions are seen, more so on the left side. There is interval increase in bilateral pleural effusions, more so on the left side. Evaluation of left lower lung fields for infiltrates is limited by the effusion. There is no pneumothorax. Metallic sutures are seen in sternum from previous cardiac surgery. IMPRESSION: Central pulmonary vessels are more prominent suggesting CHF. Small to moderate bilateral pleural effusions, more so on the left side. There is interval increase in amount of bilateral pleural effusions. Electronically Signed   By: Elmer Picker M.D.   On: 08/12/2022 19:54    Cardiac Studies  ***  Patient Profile  DAIMIAN SUDBERRY is a 76 y.o. male with ***  Assessment & Plan  ***  {Are we signing off today?:[id number]}  For questions or updates, please contact [facility] Please consult [email or web address] for contact info under        Signed, Lake Bells T. Audie Box, MD, [place]  08/13/2022 9:50 AM

## 2022-08-13 NOTE — Care Management Important Message (Signed)
Important Message  Patient Details  Name: VERDIS KOVAL MRN: [id number] Date of Birth: 1946-01-23   Medicare Important Message Given:  Yes     Shelda Altes 08/13/2022, 12:11 PM

## 2022-08-13 NOTE — Progress Notes (Addendum)
Cardiology Progress Note  Patient ID: Hector Edwards MRN: [id number] DOB: [birth year] Date of Encounter: 08/13/2022  Primary Cardiologist: Lauree Chandler, MD  Subjective   Chief Complaint: Shortness of breath  HPI: Back in A-fib this morning.  Chest x-ray with pulmonary edema versus pneumonia.  Lasix ordered by surgical team.  Reports he is short of breath.  ROS:  All other ROS reviewed and negative. Pertinent positives noted in the HPI.     Inpatient Medications  Scheduled Meds:  [START ON 08/23/2022] amiodarone  200 mg Oral Daily   amiodarone  400 mg Oral BID   apixaban  5 mg Oral BID   aspirin EC  81 mg Oral Daily   atorvastatin  80 mg Oral Daily   benazepril  20 mg Oral QHS   cefdinir  300 mg Oral Q12H   diltiazem  360 mg Oral Daily   Ferrous Fumarate  1 tablet Oral Daily   And   folic acid-pyridoxine-cyancobalamin  1 tablet Oral Daily   furosemide  40 mg Intravenous Once   furosemide  40 mg Oral Daily   pantoprazole  40 mg Oral Daily   potassium chloride  20 mEq Oral BID   sodium chloride flush  3 mL Intravenous Q12H   Continuous Infusions:  sodium chloride     PRN Meds: sodium chloride, melatonin, naphazoline-glycerin, ondansetron (ZOFRAN) IV, mouth rinse, oxyCODONE, sodium chloride flush, traMADol   Vital Signs   Vitals:   08/12/22 1948 08/13/22 0004 08/13/22 0401 08/13/22 0829  BP: (!) 151/72 111/65 (!) 130/59 123/71  Pulse: 82 86 97 (!) 113  Resp: '20 20 20 20  '$ Temp: 97.8 F (36.6 C) 98.9 F (37.2 C) 97.9 F (36.6 C) 97.6 F (36.4 C)  TempSrc: Oral Axillary Oral Oral  SpO2: 100% 95% 96% 95%  Weight:      Height:        Intake/Output Summary (Last 24 hours) at 08/13/2022 0955 Last data filed at 08/13/2022 0830 Gross per 24 hour  Intake 360 ml  Output 975 ml  Net -615 ml      08/12/2022    3:29 AM 08/11/2022    3:48 AM 08/10/2022    3:51 AM  Last 3 Weights  Weight (lbs) 295 lb 11.2 oz 295 lb 6.7 oz 296 lb 4.8 oz  Weight (kg) 134.129 kg  134 kg 134.4 kg      Telemetry  Overnight telemetry shows A-fib heart rate in the 100s, which I personally reviewed.   ECG  The most recent ECG shows sinus rhythm, nonspecific IVCD, which I personally reviewed.   Physical Exam   Vitals:   08/12/22 1948 08/13/22 0004 08/13/22 0401 08/13/22 0829  BP: (!) 151/72 111/65 (!) 130/59 123/71  Pulse: 82 86 97 (!) 113  Resp: '20 20 20 20  '$ Temp: 97.8 F (36.6 C) 98.9 F (37.2 C) 97.9 F (36.6 C) 97.6 F (36.4 C)  TempSrc: Oral Axillary Oral Oral  SpO2: 100% 95% 96% 95%  Weight:      Height:        Intake/Output Summary (Last 24 hours) at 08/13/2022 0955 Last data filed at 08/13/2022 0830 Gross per 24 hour  Intake 360 ml  Output 975 ml  Net -615 ml       08/12/2022    3:29 AM 08/11/2022    3:48 AM 08/10/2022    3:51 AM  Last 3 Weights  Weight (lbs) 295 lb 11.2 oz 295 lb 6.7 oz 296 lb 4.8 oz  Weight (kg) 134.129 kg 134 kg 134.4 kg    Body mass index is 35.99 kg/m.  General: Well nourished, well developed, in no acute distress Head: Atraumatic, normal size  Eyes: PEERLA, EOMI  Neck: Supple, no JVD Endocrine: No thryomegaly Cardiac: Normal S1, S2; irregular rhythm, no murmurs Lungs: Crackles at the lung bases Abd: Soft, nontender, no hepatomegaly  Ext: No edema, pulses 2+ Musculoskeletal: No deformities, BUE and BLE strength normal and equal Skin: Midline sternotomy scar clean and dry with no evidence of infection Neuro: Alert and oriented to person, place, time, and situation, CNII-XII grossly intact, no focal deficits  Psych: Normal mood and affect   Labs  High Sensitivity Troponin:  No results for input(s): "TROPONINIHS" in the last 720 hours.   Cardiac EnzymesNo results for input(s): "TROPONINI" in the last 168 hours. No results for input(s): "TROPIPOC" in the last 168 hours.  Chemistry Recent Labs  Lab 08/10/22 0859 08/12/22 0121 08/13/22 0057  NA 134* 134* 133*  K 3.6 3.6 3.5  CL 101 100 100  CO2 '22 24 23   '$ GLUCOSE 131* 142* 177*  BUN '15 15 16  '$ CREATININE 0.76 0.78 0.89  CALCIUM 8.4* 8.9 8.8*  GFRNONAA >60 >60 >60  ANIONGAP '11 10 10    '$ Hematology Recent Labs  Lab 08/10/22 0859 08/12/22 0121 08/13/22 0057  WBC 10.1 13.0* 15.8*  RBC 2.77* 3.05* 3.06*  HGB 9.4* 10.1* 9.9*  HCT 28.5* 31.5* 31.2*  MCV 102.9* 103.3* 102.0*  MCH 33.9 33.1 32.4  MCHC 33.0 32.1 31.7  RDW 15.2 15.2 15.0  PLT 262 367 446*   BNPNo results for input(s): "BNP", "PROBNP" in the last 168 hours.  DDimer No results for input(s): "DDIMER" in the last 168 hours.   Radiology  DG Chest 2 View  Result Date: 08/12/2022 CLINICAL DATA:  Shortness of breath on exertion EXAM: CHEST - 2 VIEW COMPARISON:  Previous studies including the examination of 08/10/2022 FINDINGS: Transverse diameter of heart is increased. Central pulmonary vessels are more prominent. Bilateral pleural effusions are seen, more so on the left side. There is interval increase in bilateral pleural effusions, more so on the left side. Evaluation of left lower lung fields for infiltrates is limited by the effusion. There is no pneumothorax. Metallic sutures are seen in sternum from previous cardiac surgery. IMPRESSION: Central pulmonary vessels are more prominent suggesting CHF. Small to moderate bilateral pleural effusions, more so on the left side. There is interval increase in amount of bilateral pleural effusions. Electronically Signed   By: Elmer Picker M.D.   On: 08/12/2022 19:54    Cardiac Studies  TTE 07/05/2022  1. Mild concentric LVH with proximal septal thickening. Thickened aortic  valve with mild AS (mean gradient 12 mmHg).   2. Left ventricular ejection fraction, by estimation, is 60 to 65%. The  left ventricle has normal function. The left ventricle has no regional  wall motion abnormalities. There is moderate left ventricular hypertrophy  of the basal-septal segment. Left  ventricular diastolic parameters are consistent with Grade I  diastolic  dysfunction (impaired relaxation).   3. Right ventricular systolic function is normal. The right ventricular  size is normal.   4. The mitral valve is normal in structure. No evidence of mitral valve  regurgitation. No evidence of mitral stenosis.   5. The aortic valve is tricuspid. Aortic valve regurgitation is not  visualized. Mild aortic valve stenosis.   6. Aortic dilatation noted. There is mild dilatation of the aortic root,  measuring 42 mm.   7. The inferior vena cava is normal in size with greater than 50%  respiratory variability, suggesting right atrial pressure of 3 mmHg.   Patient Profile  Hector Edwards is a 76 y.o. male with obesity, CAD, hypertension who was admitted on 08/01/2022 with unstable angina.  Found to have three-vessel CAD status post CABG.  Course complicated by atrial fibrillation.  Status post TEE/cardioversion.  Assessment & Plan   #Unstable angina #Three-vessel CAD status post CABG -Status post three-vessel CABG.  Continue aspirin and Lipitor.  On beta-blocker. -No symptoms of angina.  #Postoperative atrial fibrillation -Status post TEE/cardioversion.  Now with recurrent atrial fibrillation.  Plan for 2-week load of amiodarone at 400 mg twice daily per EP consultation.  Then 200 mg daily. -Continue metoprolol. -Something is driving his A-fib.  Chest x-ray with pulmonary edema versus pneumonia.  May need chest CT. -We will recheck an echocardiogram limited to evaluate his LV function. -I do agree with further diuresis.  Likely would need IV.  I will defer this to surgical team. -On Eliquis 5 mg twice daily.  Please continue.  #Hypoxic respiratory failure #Pneumonia versus pulmonary edema -Continue with Lasix.  May benefit from chest CT.  I will defer to the surgical team. -Repeat echo.  Continue with diuresis.  #HTN -Benazepril.  Metoprolol.    For questions or updates, please contact [place] Please consult  [email or web address] for contact info under   Signed, Lake Bells T. Audie Box, MD, [place]  08/13/2022 9:55 AM

## 2022-08-13 NOTE — Progress Notes (Signed)
  Echocardiogram 2D Echocardiogram has been performed.  Hector Edwards 08/13/2022, 12:17 PM

## 2022-08-13 NOTE — Progress Notes (Addendum)
[address]       [place] [address]             [phone]      5 Days Post-Op Procedure(s) (LRB): TRANSESOPHAGEAL ECHOCARDIOGRAM (TEE) (N/A) CARDIOVERSION (N/A) Subjective: Patient states he did not get much sleep last night and has been short of breath with walking but otherwise has no new complaints.  Objective: Vital signs in last 24 hours: Temp:  [97.4 F (36.3 C)-98.9 F (37.2 C)] 97.9 F (36.6 C) (11/06 0401) Pulse Rate:  [82-97] 97 (11/06 0401) Cardiac Rhythm: Normal sinus rhythm (11/05 1948) Resp:  [18-20] 20 (11/06 0401) BP: (111-163)/(59-74) 130/59 (11/06 0401) SpO2:  [93 %-100 %] 96 % (11/06 0401)  Hemodynamic parameters for last 24 hours:    Intake/Output from previous day: 11/05 0701 - 11/06 0700 In: 600 [P.O.:600] Out: 875 [Urine:875] Intake/Output this shift: No intake/output data recorded.  General appearance: alert, cooperative, and no distress Neurologic: intact Heart: regular rate and rhythm, no murmur, rub or gallop Lungs: Slightly diminished bibasilar Abdomen: soft, non-tender; bowel sounds normal; no masses,  no organomegaly Extremities: extremities normal, atraumatic, no cyanosis or edema Wound: Clean, dry, intact  Lab Results: Recent Labs    08/12/22 0121 08/13/22 0057  WBC 13.0* 15.8*  HGB 10.1* 9.9*  HCT 31.5* 31.2*  PLT 367 446*   BMET:  Recent Labs    08/12/22 0121 08/13/22 0057  NA 134* 133*  K 3.6 3.5  CL 100 100  CO2 24 23  GLUCOSE 142* 177*  BUN 15 16  CREATININE 0.78 0.89  CALCIUM 8.9 8.8*    PT/INR: No results for input(s): "LABPROT", "INR" in the last 72 hours. ABG    Component Value Date/Time   PHART 7.342 (L) 08/03/2022 2048   HCO3 19.6 (L) 08/03/2022 2048   TCO2 21 (L) 08/03/2022 2048   ACIDBASEDEF 5.0 (H) 08/03/2022 2048   O2SAT 97 08/03/2022 2048   CBG (last 3)  No results for input(s): "GLUCAP" in the last 72 hours.  Assessment/Plan: S/P Procedure(s)  (LRB): TRANSESOPHAGEAL ECHOCARDIOGRAM (TEE) (N/A) CARDIOVERSION (N/A)  CV: hx of afib/aflutter, POD5 DCCV. Episode of atrial fibrillation this morning, rate 110-120. Converted to NSR rate 70's. On cardizem '360mg'$  QD, amiodarone '400mg'$  BID and eliquis '5mg'$  BID. Will monitor closely today. SBP 111-130, on benazepril '20mg'$  QD.  Pulm: Saturating 96% on RA. CXR yesterday shows increased small-moderate bilateral pleural effusions. Continue IS and ambulation. Will consider one dose of IV diuresis.  GI: +BM, OK appetite Endo: nondiabetic Renal: under preop weight, cr stable.  Expected postop ABLA: stable H/H 9.9/31.2 Phlebitis: LUE phlebitis around peripheral IV site. Peripheral IV has been removed. Continue warm compresses. On cefdinir x 1 week, patient may need to stay due to deconditioning. Will discuss with surgeon possible IV abx regimen.  Deconditioned: Became presyncopal and diaphoretic yesterday with stair training. Continue PT/OT. PT/OT eval recommended home health PT/OT.  Dispo: Monitor heart rate and rhythm today. Will discuss with surgeon one dose of IV diuresis for pleural effusions. Continue PT/OT work for deconditioning. Home health PT/OT and BSC is ordered. New information that the patient will not have assistance at home for more than 1 night. Lives in 3 story home alone. Will consult TOC for assistance. May need to keep him longer until deconditioning is much improved.    LOS: 12 days    [name], PA-C 08/13/2022   Chart reviewed, patient examined, agree with above. Has been in  and out of atrial fib today. In AF his rate is 110's and sinus 80's. He remains on po amio and cardizem. He had echo today ordered by cardiology. Official readying not done but it was a very suboptimal study due to his morbid obesity, rapid atrial fib, recent surgery. I don't see any significant pericardial effusion.   CXR yesterday showed moderate left pleural effusion and bibasilar atelectasis. His wt is  below preop but we will continue diuretic. Creat normal.  He is not walking some but I don't think he is independent enough yet to go home. He does not have any dedicated person to be with him and help him at home.  Will repeat CXR in am and if the left effusion is unchanged will get thoracentesis on left

## 2022-08-13 NOTE — Progress Notes (Signed)
Occupational Therapy Treatment Patient Details Name: Hector Edwards MRN: [id number] DOB: [birth year] Today's Date: 08/13/2022   History of present illness Patient is 76 y.o. male S/p median sternotomy for CABGx3  with Rt greater saphenous vein and Lt internal mammary artery on 28/41/32 with complications of Afib in RVR following surgery and underwent TEE with CV on 08/08/22. PMH significant for depression, GERD, HTN, HLD, low back pain, OSA.   OT comments  Patient supine in bed and agreeable to OT.  He requires mod assist for bed mobility, min guard for transfers and min assist for ADLs.  He continues to require cueing for recall/adherence to sternal precautions, especially during ADLs and transfers.  Pt appears short of breath today, but SpO2 stable on RA; pt reports this is "how he has been". Encouraged spending most of today OOB, using IS- pt agreeable.  He reports he is not concerned with getting dressed at home as he has help, more concerned with "getting up and down" but reports having new high commodes, lift chair and an adjustable bed.  Will follow acutely to maximize independence, tolerance and adherence to precautions.    Recommendations for follow up therapy are one component of a multi-disciplinary discharge planning process, led by the attending physician.  Recommendations may be updated based on patient status, additional functional criteria and insurance authorization.    Follow Up Recommendations  Home health OT    Assistance Recommended at Discharge Intermittent Supervision/Assistance  Patient can return home with the following  A little help with walking and/or transfers;Assistance with cooking/housework;Direct supervision/assist for medications management;Direct supervision/assist for financial management;Assist for transportation;Help with stairs or ramp for entrance;A little help with bathing/dressing/bathroom   Equipment Recommendations  BSC/3in1    Recommendations for  Other Services      Precautions / Restrictions Precautions Precautions: Fall;Sternal Precaution Booklet Issued: Yes (comment) Precaution Comments: Poor adherence to precautions, requires frequent cues. Restrictions Weight Bearing Restrictions: Yes Other Position/Activity Restrictions: sternal precautions       Mobility Bed Mobility Overal bed mobility: Needs Assistance Bed Mobility: Rolling, Sidelying to Sit Rolling: Supervision Sidelying to sit: Mod assist       General bed mobility comments: for trunk support to ascend, cueing for technique and following precautions    Transfers Overall transfer level: Needs assistance Equipment used: None Transfers: Sit to/from Stand Sit to Stand: Min guard           General transfer comment: cueing for hand placement and safety     Balance Overall balance assessment: Mild deficits observed, not formally tested                                         ADL either performed or assessed with clinical judgement   ADL Overall ADL's : Needs assistance/impaired     Grooming: Set up;Sitting           Upper Body Dressing : Minimal assistance;Sitting Upper Body Dressing Details (indicate cue type and reason): educated on several ways to complete UB dressing, continues to require min assist Lower Body Dressing: Minimal assistance;Sit to/from stand Lower Body Dressing Details (indicate cue type and reason): donning pj pants with increased time, min assist to pull over hips and cueing to adhere to sternal precautions Toilet Transfer: Min guard;Ambulation           Functional mobility during ADLs: Min guard General ADL Comments: cueing for  sternal precautions, pt reporting not concerned about getting dressing because he will have help- more concerned about "getting up and down"    Extremity/Trunk Assessment              Vision       Perception     Praxis      Cognition Arousal/Alertness:  Awake/alert Behavior During Therapy: Flat affect Overall Cognitive Status: No family/caregiver present to determine baseline cognitive functioning Area of Impairment: Memory, Awareness                     Memory: Decreased recall of precautions     Awareness: Emergent   General Comments: pt with decreased recall of sternal precautions and requires constant cueing to adhere functionally        Exercises      Shoulder Instructions       General Comments SpO2 stable on RA, HR ranged from 110s-140s    Pertinent Vitals/ Pain       Pain Assessment Pain Assessment: Faces Faces Pain Scale: No hurt Pain Intervention(s): Monitored during session  Home Living                                          Prior Functioning/Environment              Frequency  Min 2X/week        Progress Toward Goals  OT Goals(current goals can now be found in the care plan section)  Progress towards OT goals: Progressing toward goals  Acute Rehab OT Goals Patient Stated Goal: get home OT Goal Formulation: With patient Time For Goal Achievement: 08/25/22 Potential to Achieve Goals: Good  Plan Discharge plan remains appropriate;Frequency remains appropriate    Co-evaluation                 AM-PAC OT "6 Clicks" Hector Activity     Outcome Measure   Help from another person eating meals?: None Help from another person taking care of personal grooming?: A Little Help from another person toileting, which includes using toliet, bedpan, or urinal?: A Little Help from another person bathing (including washing, rinsing, drying)?: A Little Help from another person to put on and taking off regular upper body clothing?: A Little Help from another person to put on and taking off regular lower body clothing?: A Little 6 Click Score: 19    End of Session    OT Visit Diagnosis: Unsteadiness on feet (R26.81);Other abnormalities of gait and mobility  (R26.89);Muscle weakness (generalized) (M62.81)   Activity Tolerance Patient tolerated treatment well   Patient Left in chair;with call bell/phone within reach   Nurse Communication Mobility status;Other (comment);Precautions (RN aware of no chair alarm)        Time: [phone] OT Time Calculation (min): 29 min  Charges: OT General Charges $OT Visit: 1 Visit OT Treatments $Self Care/Home Management : 23-37 mins  [facility] [phone]   Delight Stare 08/13/2022, 10:04 AM

## 2022-08-13 NOTE — Progress Notes (Signed)
Pt too tired to walk, pt was agreeable to sit up in chair. Pt was helped to bathroom and then assisted to chair. Pt has call button in reach when ready to get back to bed.

## 2022-08-13 NOTE — Progress Notes (Signed)
Pt reported to nurse that he had been hearing voices. Nurse asked for more information and pt states "it's more like songs or disc jockey sounds". PA paged and pain medication discontinued at this time.   Raelyn Number, RN

## 2022-08-14 ENCOUNTER — Inpatient Hospital Stay (HOSPITAL_COMMUNITY): Payer: Medicare HMO

## 2022-08-14 DIAGNOSIS — I4819 Other persistent atrial fibrillation: Secondary | ICD-10-CM | POA: Diagnosis not present

## 2022-08-14 DIAGNOSIS — Z951 Presence of aortocoronary bypass graft: Secondary | ICD-10-CM | POA: Diagnosis not present

## 2022-08-14 LAB — BASIC METABOLIC PANEL
Anion gap: 11 (ref 5–15)
BUN: 19 mg/dL (ref 8–23)
CO2: 26 mmol/L (ref 22–32)
Calcium: 8.8 mg/dL — ABNORMAL LOW (ref 8.9–10.3)
Chloride: 97 mmol/L — ABNORMAL LOW (ref 98–111)
Creatinine, Ser: 0.34 mg/dL — ABNORMAL LOW (ref 0.61–1.24)
GFR, Estimated: >60 mL/min (ref >60)
Glucose, Bld: 143 mg/dL — ABNORMAL HIGH (ref 70–99)
Potassium: 3.9 mmol/L (ref 3.5–5.1)
Sodium: 134 mmol/L — ABNORMAL LOW (ref 135–145)

## 2022-08-14 MED ORDER — METOPROLOL TARTRATE 25 MG PO TABS
25.0000 mg | ORAL_TABLET | Freq: Two times a day (BID) | ORAL | Status: DC
  Administered 2022-08-14 – 2022-08-18 (×8): 25 mg via ORAL
  Filled 2022-08-14 (×8): qty 1

## 2022-08-14 MED ORDER — MAGNESIUM HYDROXIDE 400 MG/5ML PO SUSP: 30.0000 mL | Freq: Every day | ORAL | Status: DC | PRN

## 2022-08-14 MED ORDER — DOCUSATE SODIUM 100 MG PO CAPS
100.0000 mg | ORAL_CAPSULE | Freq: Every day | ORAL | Status: DC
  Administered 2022-08-14 – 2022-08-18 (×5): 100 mg via ORAL
  Filled 2022-08-14 (×4): qty 1

## 2022-08-14 MED ORDER — BISACODYL 10 MG RE SUPP: 10.0000 mg | Freq: Every day | RECTAL | Status: DC | PRN

## 2022-08-14 NOTE — Progress Notes (Signed)
Mobility Specialist Progress Note:   08/14/22 0905  Mobility  Activity Ambulated with assistance in room;Ambulated with assistance to bathroom  Level of Assistance Contact guard assist, steadying assist  Assistive Device Front wheel walker  Distance Ambulated (ft) 44 ft  Activity Response Tolerated well  $Mobility charge 1 Mobility   Pt received in bed willing to participate in mobility.  No complaints of pain. Pt needing modA to sit EOB then contact guard for the rest of ambulation.MaxA for peri-care. Left in chair with call bell in reach and all needs met.   [facility] Surveyor, mining Chat only

## 2022-08-14 NOTE — Progress Notes (Addendum)
Physical Therapy Treatment Patient Details Name: Hector Edwards MRN: [id number] DOB: [birth year] Today's Date: 08/14/2022   History of Present Illness Patient is 76 y.o. male S/p median sternotomy for CABGx3  with Rt greater saphenous vein and Lt internal mammary artery on 95/63/87 with complications of Afib in RVR following surgery and underwent TEE with CV on 08/08/22. PMH significant for depression, GERD, HTN, HLD, low back pain, OSA.    PT Comments    Nurse cleared pt for participation in setting of fluctuating HR. Pt seen for PT tx with pt agreeable to tx. Pt declines practicing stair negotiation, reporting "they about done me in" the other day & pt reports he has no steps to enter the main area of his house & can access one bedroom without negotiating stairs.  Pt is able to transfer STS x 3 times from recliner but uses BUE to pull himself forwards despite education/review of sternal precautions. Pt transfers STS using rocking momentum but tolerates standing <20 seconds 2/2 "feeling weak". Attempted to obtain orthostatic BP but pt not able to tolerate standing long enough. Notified nurse of pt's limited mobility on this date.   Per chart, pt was ambulating >300 ft without AD & supervision last week but only ambulated 44 ft with a walker with mobility specialist prior to PT tx. Pt appears to be declining in regards to functional mobility & only has a roommate at home. Due to decline in functional status, pt without help upon d/c, and pt still able to perform basic self care tasks without assistance, have updated d/c recommendations to SNF. Pt educated on this & reports he is agreeable. Will continue to follow pt acutely to address activity tolerance, gait with LRAD, and strengthening.  BP checked in LUE: Sitting after 1st standing attempt: 108/69 mmHg MAP 82 After 2nd standing attempt: 118/91 mmHg MAP 101, rechecked 98/66 mmHg MAP 78 Increased to 106/72 mmHg MAP 83 with sitting HR 101-140  bpm during session  Notified nurse pt may benefit from orthostatic vital sign assessment.     Recommendations for follow up therapy are one component of a multi-disciplinary discharge planning process, led by the attending physician.  Recommendations may be updated based on patient status, additional functional criteria and insurance authorization.  Follow Up Recommendations  Skilled nursing-short term rehab (<3 hours/day) Can patient physically be transported by private vehicle: Yes   Assistance Recommended at Discharge Frequent or constant Supervision/Assistance  Patient can return home with the following A little help with walking and/or transfers;A little help with bathing/dressing/bathroom;Assistance with cooking/housework;Assist for transportation;Help with stairs or ramp for entrance   Equipment Recommendations  None recommended by PT    Recommendations for Other Services       Precautions / Restrictions Precautions Precautions: Fall;Sternal Precaution Booklet Issued: Yes (comment) Precaution Comments: Poor adherence to precautions, requires frequent cues. Restrictions Weight Bearing Restrictions: Yes RUE Partial Weight Bearing Percentage or Pounds: sternal LUE Partial Weight Bearing Percentage or Pounds: sternal Other Position/Activity Restrictions: sternal precautions     Mobility  Bed Mobility               General bed mobility comments: pt received & left sitting in recliner    Transfers Overall transfer level: Needs assistance Equipment used: None Transfers: Sit to/from Stand Sit to Stand: Min guard           General transfer comment: STS from recliner x 3 times with pt using rocking momentum    Ambulation/Gait  Stairs             Wheelchair Mobility    Modified Rankin (Stroke Patients Only)       Balance Overall balance assessment: Needs assistance, Mild deficits observed, not formally  tested Sitting-balance support: Feet supported Sitting balance-Leahy Scale: Good                                      Cognition Arousal/Alertness: Awake/alert Behavior During Therapy: Flat affect Overall Cognitive Status: No family/caregiver present to determine baseline cognitive functioning Area of Impairment: Memory, Awareness                     Memory: Decreased recall of precautions     Awareness: Emergent, Anticipatory   General Comments: Pt with decreased recall of sternal precautions requiring cuing throughout session, decreased awareness of current mobility needs & decreased safety with mobility.        Exercises      General Comments        Pertinent Vitals/Pain Pain Assessment Pain Assessment: No/denies pain    Home Living                          Prior Function            PT Goals (current goals can now be found in the care plan section) Acute Rehab PT Goals Patient Stated Goal: get home and regain independence PT Goal Formulation: With patient/family Time For Goal Achievement: 08/25/22 Potential to Achieve Goals: Good Progress towards PT goals: PT to reassess next treatment    Frequency    Min 3X/week      PT Plan Discharge plan needs to be updated    Co-evaluation              AM-PAC PT "6 Clicks" Mobility   Outcome Measure  Help needed turning from your back to your side while in a flat bed without using bedrails?: A Little Help needed moving from lying on your back to sitting on the side of a flat bed without using bedrails?: A Lot Help needed moving to and from a bed to a chair (including a wheelchair)?: A Little Help needed standing up from a chair using your arms (e.g., wheelchair or bedside chair)?: A Little Help needed to walk in hospital room?: A Little Help needed climbing 3-5 steps with a railing? : A Little 6 Click Score: 17    End of Session Equipment Utilized During Treatment:  Gait belt Activity Tolerance: Treatment limited secondary to medical complications (Comment) Patient left: in chair;with call bell/phone within reach Nurse Communication: Mobility status (HR, BP during session) PT Visit Diagnosis: Muscle weakness (generalized) (M62.81);Difficulty in walking, not elsewhere classified (R26.2)     Time: [id number] PT Time Calculation (min) (ACUTE ONLY): 25 min  Charges:  $Therapeutic Activity: 23-37 mins                     Lavone Nian, PT, DPT 08/14/22, 10:07 AM   Hector Edwards 08/14/2022, 10:03 AM

## 2022-08-14 NOTE — Progress Notes (Addendum)
Pt declined ambulation, reports he feels tired.

## 2022-08-14 NOTE — Progress Notes (Signed)
Cardiology Progress Note  Patient ID: Hector Edwards MRN: [id number] DOB: [birth year] Date of Encounter: 08/14/2022  Primary Cardiologist: Lauree Chandler, MD  Subjective   Chief Complaint: SOB  HPI: Good diuresis.  Chest x-ray improved.  Remains in and out of atrial fibrillation.  ROS:  All other ROS reviewed and negative. Pertinent positives noted in the HPI.     Inpatient Medications  Scheduled Meds:  [START ON 08/23/2022] amiodarone  200 mg Oral Daily   amiodarone  400 mg Oral BID   apixaban  5 mg Oral BID   aspirin EC  81 mg Oral Daily   atorvastatin  80 mg Oral Daily   benazepril  20 mg Oral QHS   cefdinir  300 mg Oral Q12H   diltiazem  360 mg Oral Daily   docusate sodium  100 mg Oral Daily   Ferrous Fumarate  1 tablet Oral Daily   And   folic acid-pyridoxine-cyancobalamin  1 tablet Oral Daily   metoprolol tartrate  25 mg Oral BID   pantoprazole  40 mg Oral Daily   potassium chloride  20 mEq Oral TID   sodium chloride flush  3 mL Intravenous Q12H   torsemide  40 mg Oral Daily   Continuous Infusions:  sodium chloride     PRN Meds: sodium chloride, bisacodyl, magnesium hydroxide, melatonin, naphazoline-glycerin, ondansetron (ZOFRAN) IV, mouth rinse, oxyCODONE, sodium chloride flush, traMADol   Vital Signs   Vitals:   08/14/22 0000 08/14/22 0325 08/14/22 0755 08/14/22 0914  BP:  127/64 133/65   Pulse: 70  (!) 108 (!) 107  Resp: 20 20 (!) 28 20  Temp: 98 F (36.7 C) 98 F (36.7 C) 97.7 F (36.5 C)   TempSrc:  Oral Oral   SpO2: 93% 94% 94% 96%  Weight:  131.2 kg    Height:        Intake/Output Summary (Last 24 hours) at 08/14/2022 0944 Last data filed at 08/14/2022 0758 Gross per 24 hour  Intake 360 ml  Output 2600 ml  Net -2240 ml      08/14/2022    3:25 AM 08/12/2022    3:29 AM 08/11/2022    3:48 AM  Last 3 Weights  Weight (lbs) 289 lb 3.2 oz 295 lb 11.2 oz 295 lb 6.7 oz  Weight (kg) 131.18 kg 134.129 kg 134 kg      Telemetry   Overnight telemetry shows A-fib heart rate in the low 100s, paroxysmal, which I personally reviewed.   Physical Exam   Vitals:   08/14/22 0000 08/14/22 0325 08/14/22 0755 08/14/22 0914  BP:  127/64 133/65   Pulse: 70  (!) 108 (!) 107  Resp: 20 20 (!) 28 20  Temp: 98 F (36.7 C) 98 F (36.7 C) 97.7 F (36.5 C)   TempSrc:  Oral Oral   SpO2: 93% 94% 94% 96%  Weight:  131.2 kg    Height:        Intake/Output Summary (Last 24 hours) at 08/14/2022 0944 Last data filed at 08/14/2022 0758 Gross per 24 hour  Intake 360 ml  Output 2600 ml  Net -2240 ml       08/14/2022    3:25 AM 08/12/2022    3:29 AM 08/11/2022    3:48 AM  Last 3 Weights  Weight (lbs) 289 lb 3.2 oz 295 lb 11.2 oz 295 lb 6.7 oz  Weight (kg) 131.18 kg 134.129 kg 134 kg    Body mass index is 35.2 kg/m.  General:  Well nourished, well developed, in no acute distress Head: Atraumatic, normal size  Eyes: PEERLA, EOMI  Neck: Supple, no JVD Endocrine: No thryomegaly Cardiac: Normal S1, S2; irregular rhythm Lungs: Diminished breath sounds bilaterally Abd: Soft, nontender, no hepatomegaly  Ext: No edema, pulses 2+ Musculoskeletal: No deformities, BUE and BLE strength normal and equal Skin: Midline sternotomy scar clean and dry Neuro: Alert and oriented to person, place, time, and situation, CNII-XII grossly intact, no focal deficits  Psych: Normal mood and affect   Labs  High Sensitivity Troponin:  No results for input(s): "TROPONINIHS" in the last 720 hours.   Cardiac EnzymesNo results for input(s): "TROPONINI" in the last 168 hours. No results for input(s): "TROPIPOC" in the last 168 hours.  Chemistry Recent Labs  Lab 08/12/22 0121 08/13/22 0057 08/14/22 0120  NA 134* 133* 134*  K 3.6 3.5 3.9  CL 100 100 97*  CO2 '24 23 26  '$ GLUCOSE 142* 177* 143*  BUN '15 16 19  '$ CREATININE 0.78 0.89 0.34*  CALCIUM 8.9 8.8* 8.8*  GFRNONAA >60 >60 >60  ANIONGAP '10 10 11    '$ Hematology Recent Labs  Lab 08/10/22 0859  08/12/22 0121 08/13/22 0057  WBC 10.1 13.0* 15.8*  RBC 2.77* 3.05* 3.06*  HGB 9.4* 10.1* 9.9*  HCT 28.5* 31.5* 31.2*  MCV 102.9* 103.3* 102.0*  MCH 33.9 33.1 32.4  MCHC 33.0 32.1 31.7  RDW 15.2 15.2 15.0  PLT 262 367 446*   BNPNo results for input(s): "BNP", "PROBNP" in the last 168 hours.  DDimer No results for input(s): "DDIMER" in the last 168 hours.   Radiology  ECHOCARDIOGRAM LIMITED  Result Date: 08/13/2022    ECHOCARDIOGRAM LIMITED REPORT   Patient Name:   Hector Edwards [facility] Date of Exam: 08/13/2022 Medical Rec #:  [id number]        Height:       76.0 in Accession #:    [id number]       Weight:       295.7 lb Date of Birth:  [birth year]       BSA:          2.616 m Patient Age:    76 years         BP:           123/67 mmHg Patient Gender: M                HR:           116 bpm. Exam Location:  Inpatient Procedure: 2D Echo, Cardiac Doppler, Color Doppler and Intracardiac            Opacification Agent Indications:    Atrial fibrillation  History:        Patient has prior history of Echocardiogram examinations, most                 recent 08/08/2022. Prior CABG, Arrythmias:Atrial Fibrillation;                 Signs/Symptoms:Shortness of Breath. CABG 11/03/21. S/P TEE and                 cardioversion 08/08/22.  Sonographer:    Clayton Lefort RDCS (AE) Referring Phys: [id number] [place]  Sonographer Comments: Technically challenging study due to limited acoustic windows, Technically difficult study due to poor echo windows, suboptimal parasternal window, suboptimal apical window, no subcostal window and patient is obese. IMPRESSIONS  1. Technically difficult study. Left ventricular ejection fraction, by estimation, is 60  to 65%. The left ventricle has normal function. The left ventricle has no regional wall motion abnormalities. There is moderate left ventricular hypertrophy. Left ventricular diastolic parameters are indeterminate.  2. Right ventricular systolic function was not well visualized.  The right ventricular size is not well visualized.  3. A small pericardial effusion is present.  4. The mitral valve is normal in structure. No evidence of mitral valve regurgitation.  5. The aortic valve was not well visualized. Aortic valve regurgitation is not visualized. FINDINGS  Left Ventricle: Left ventricular ejection fraction, by estimation, is 60 to 65%. The left ventricle has normal function. The left ventricle has no regional wall motion abnormalities. Definity contrast agent was given IV to delineate the left ventricular  endocardial borders. The left ventricular internal cavity size was normal in size. There is moderate left ventricular hypertrophy. Left ventricular diastolic parameters are indeterminate. Right Ventricle: The right ventricular size is not well visualized. Right ventricular systolic function was not well visualized. Pericardium: A small pericardial effusion is present. Mitral Valve: The mitral valve is normal in structure. MV peak gradient, 3.7 mmHg. The mean mitral valve gradient is 1.0 mmHg. Aortic Valve: The aortic valve was not well visualized. Aortic valve regurgitation is not visualized. Additional Comments: Spectral Doppler performed. Color Doppler performed.  LEFT VENTRICLE PLAX 2D LVIDd:         4.30 cm     Diastology LVIDs:         2.80 cm     LV e' medial:    8.70 cm/s LV PW:         1.40 cm     LV E/e' medial:  12.1 LV IVS:        1.40 cm     LV e' lateral:   6.74 cm/s                            LV E/e' lateral: 15.6  LV Volumes (MOD) LV vol d, MOD A2C: 43.4 ml LV vol d, MOD A4C: 61.3 ml LV vol s, MOD A2C: 15.3 ml LV vol s, MOD A4C: 21.6 ml LV SV MOD A2C:     28.1 ml LV SV MOD A4C:     61.3 ml LV SV MOD BP:      36.4 ml LEFT ATRIUM             Index LA diam:        3.70 cm 1.41 cm/m LA Vol (A2C):   42.4 ml 16.21 ml/m LA Vol (A4C):   34.9 ml 13.34 ml/m LA Biplane Vol: 38.5 ml 14.72 ml/m  MITRAL VALVE MV Area (PHT): 5.54 cm MV Peak grad:  3.7 mmHg MV Mean grad:  1.0 mmHg  MV Vmax:       0.97 m/s MV Vmean:      48.6 cm/s MV Decel Time: 137 msec MV E velocity: 105.00 cm/s MV A velocity: 22.40 cm/s MV E/A ratio:  4.69 Oswaldo Milian MD Electronically signed by Oswaldo Milian MD Signature Date/Time: 08/13/2022/1:18:05 PM    Final    DG Chest 2 View  Result Date: 08/12/2022 CLINICAL DATA:  Shortness of breath on exertion EXAM: CHEST - 2 VIEW COMPARISON:  Previous studies including the examination of 08/10/2022 FINDINGS: Transverse diameter of heart is increased. Central pulmonary vessels are more prominent. Bilateral pleural effusions are seen, more so on the left side. There is interval increase in bilateral pleural effusions, more so on  the left side. Evaluation of left lower lung fields for infiltrates is limited by the effusion. There is no pneumothorax. Metallic sutures are seen in sternum from previous cardiac surgery. IMPRESSION: Central pulmonary vessels are more prominent suggesting CHF. Small to moderate bilateral pleural effusions, more so on the left side. There is interval increase in amount of bilateral pleural effusions. Electronically Signed   By: Elmer Picker M.D.   On: 08/12/2022 19:54    Cardiac Studies  TTE 08/13/2022  1. Technically difficult study. Left ventricular ejection fraction, by  estimation, is 60 to 65%. The left ventricle has normal function. The left  ventricle has no regional wall motion abnormalities. There is moderate  left ventricular hypertrophy. Left  ventricular diastolic parameters are indeterminate.   2. Right ventricular systolic function was not well visualized. The right  ventricular size is not well visualized.   3. A small pericardial effusion is present.   4. The mitral valve is normal in structure. No evidence of mitral valve  regurgitation.   5. The aortic valve was not well visualized. Aortic valve regurgitation  is not visualized.   Patient Profile  Hector Edwards is a 76 y.o. male with obesity,  CAD, hypertension who was admitted on 08/01/2022 with unstable angina.  Found to have three-vessel CAD status post CABG.  Course complicated by atrial fibrillation.  Status post TEE/cardioversion.   Assessment & Plan   #Unstable Angina #3v CAD s/p CABG -Status post CABG.  Doing well.  Continue aspirin and Lipitor.  #Hypoxic respiratory failure #Pulmonary edema -Chest x-ray improving with Lasix.  Lasix as needed moving forward.  Overall doing well.  #Postoperative A-fib -Status post TEE/cardioversion but has had recurrence of atrial fibrillation. -Seems to be in and out of A-fib.  Continue with amiodarone load as recommended by EP. -On diltiazem 360 mg daily.  I have added metoprolol to tartrate 25 mg twice daily to help with rate control. -Echo shows his EF is normal.  No significant pericardial effusion.  Chest x-ray improving. -Overall suspect this will just take time to improve.  We will continue with above medications. -Continue Eliquis 5 mg twice daily.  #Hypertension -Benazepril, metoprolol and diltiazem.  BP well controlled.    For questions or updates, please contact [place] Please consult [email or web address] for contact info under   Signed, [place] T. Audie Box, MD, [place]  08/14/2022 9:44 AM

## 2022-08-14 NOTE — Progress Notes (Addendum)
[address]       [place] [id number]             [phone]      6 Days Post-Op Procedure(s) (LRB): TRANSESOPHAGEAL ECHOCARDIOGRAM (TEE) (N/A) CARDIOVERSION (N/A) Subjective: Patient states he breathing seems better today, no new complaints  Objective: Vital signs in last 24 hours: Temp:  [97.5 F (36.4 C)-98 F (36.7 C)] 98 F (36.7 C) (11/07 0325) Pulse Rate:  [70-113] 70 (11/07 0000) Cardiac Rhythm: Atrial fibrillation (11/06 2245) Resp:  [20] 20 (11/07 0325) BP: (123-130)/(61-78) 127/64 (11/07 0325) SpO2:  [93 %-96 %] 94 % (11/07 0325) Weight:  [131.2 kg] 131.2 kg (11/07 0325)  Hemodynamic parameters for last 24 hours:    Intake/Output from previous day: 11/06 0701 - 11/07 0700 In: 120 [P.O.:120] Out: 2500 [Urine:2500] Intake/Output this shift: No intake/output data recorded.  General appearance: alert, cooperative, and no distress Neurologic: intact Heart: irregularly irregular rhythm Lungs: diminished bibasilar breath sounds Abdomen: soft, non-tender; bowel sounds normal; no masses,  no organomegaly Extremities: extremities normal, atraumatic, no cyanosis or edema Wound: Clean, dry, intact  Lab Results: Recent Labs    08/12/22 0121 08/13/22 0057  WBC 13.0* 15.8*  HGB 10.1* 9.9*  HCT 31.5* 31.2*  PLT 367 446*   BMET:  Recent Labs    08/13/22 0057 08/14/22 0120  NA 133* 134*  K 3.5 3.9  CL 100 97*  CO2 23 26  GLUCOSE 177* 143*  BUN 16 19  CREATININE 0.89 0.34*  CALCIUM 8.8* 8.8*    PT/INR: No results for input(s): "LABPROT", "INR" in the last 72 hours. ABG    Component Value Date/Time   PHART 7.342 (L) 08/03/2022 2048   HCO3 19.6 (L) 08/03/2022 2048   TCO2 21 (L) 08/03/2022 2048   ACIDBASEDEF 5.0 (H) 08/03/2022 2048   O2SAT 97 08/03/2022 2048   CBG (last 3)  No results for input(s): "GLUCAP" in the last 72 hours.  Assessment/Plan: S/P Procedure(s) (LRB): TRANSESOPHAGEAL ECHOCARDIOGRAM (TEE)  (N/A) CARDIOVERSION (N/A)  CV: Hx of afib/flutter, POD 6 DCCV. In atrial fibrillation this AM, looks like he has been in afib since 11PM last night. Rate 90-118. On amiodarone '400mg'$  BID, diltiazem '360mg'$  and eliquis '5mg'$  BID. SBP 127, on benazepril '20mg'$ . Start metoprolol tartrate '25mg'$  BID per cardiology, appreciate cardiology assistance with this.  Pulm: CXR today shows improved L pleural effusion after aggressive diuresis yesterday. Continue ambulation and IS.  GI: +BM, OK appetite Endo: nondiabetic, CBGs d/c'd Renal: Under preop weight. Cr 0.34 today. Continue PO diuresis, will monitor Cr.  Phlebitis: Superficial phlebitis on LUE around peripheral IV site, likely due to amiodarone infusion. Improved today. On cefdinir x 1 week Deconditioning: Not much ambulation yesterday, continue PT/OT. Dispo: Patient states he has a recliner and bed for his downstairs which has a ramp leading to it but will not have help at home. Still deconditioned and did not ambulate much yesterday. Home health OT/PT ordered. If increased ambulation d/c next 24/48 hours.  Addendum: Per PT, pt functional status has declined due to deconditioning. New recommendation for SNF. Will consult TOC for assistance with SNF placement.     LOS: 13 days    [name], PA-C 08/14/2022   Chart reviewed, patient examined, agree with above. He did not ambulate much today. Says he feels wiped out. He lays in bed most of the time and sleeps. 2D echo looked ok. EF normal, no significant pericardial effusion. BP has been  ok. Intermittent AF but sinus at this time. I wonder if amio or cardizem are contributing to lethargy and generalized weakness. Maybe this is his preop baseline. He diuresed well with Torsemide. Wt is 11 lbs under preop wt but lytes, BUN, creat, BP fine so I don't think he is over-diruesed.

## 2022-08-15 LAB — BASIC METABOLIC PANEL
Anion gap: 9 (ref 5–15)
BUN: 21 mg/dL (ref 8–23)
CO2: 25 mmol/L (ref 22–32)
Calcium: 9 mg/dL (ref 8.9–10.3)
Chloride: 99 mmol/L (ref 98–111)
Creatinine, Ser: 0.97 mg/dL (ref 0.61–1.24)
GFR, Estimated: >60 mL/min (ref >60)
Glucose, Bld: 149 mg/dL — ABNORMAL HIGH (ref 70–99)
Potassium: 3.8 mmol/L (ref 3.5–5.1)
Sodium: 133 mmol/L — ABNORMAL LOW (ref 135–145)

## 2022-08-15 LAB — CBC
HCT: 30 % — ABNORMAL LOW (ref 39.0–52.0)
Hemoglobin: 10.1 g/dL — ABNORMAL LOW (ref 13.0–17.0)
MCH: 33.3 pg (ref 26.0–34.0)
MCHC: 33.7 g/dL (ref 30.0–36.0)
MCV: 99 fL (ref 80.0–100.0)
Platelets: 429 10*3/uL — ABNORMAL HIGH (ref 150–400)
RBC: 3.03 MIL/uL — ABNORMAL LOW (ref 4.22–5.81)
RDW: 15.4 % (ref 11.5–15.5)
WBC: 14.9 10*3/uL — ABNORMAL HIGH (ref 4.0–10.5)
nRBC: 0 % (ref 0.0–0.2)

## 2022-08-15 MED ORDER — TRAMADOL HCL 50 MG PO TABS: 50.0000 mg | ORAL_TABLET | ORAL | Status: DC | PRN

## 2022-08-15 MED ORDER — POTASSIUM CHLORIDE CRYS ER 20 MEQ PO TBCR
20.0000 meq | EXTENDED_RELEASE_TABLET | Freq: Two times a day (BID) | ORAL | Status: DC
  Administered 2022-08-15 – 2022-08-18 (×7): 20 meq via ORAL
  Filled 2022-08-15 (×7): qty 1

## 2022-08-15 MED ORDER — TORSEMIDE 20 MG PO TABS
20.0000 mg | ORAL_TABLET | Freq: Every day | ORAL | Status: DC
  Administered 2022-08-15 – 2022-08-18 (×4): 20 mg via ORAL
  Filled 2022-08-15 (×4): qty 1

## 2022-08-15 NOTE — NC FL2 (Addendum)
[place] MEDICAID FL2 LEVEL OF CARE SCREENING TOOL     IDENTIFICATION  Patient Name: Hector Edwards Birthdate: Feb 12, 1946 Sex: male Admission Date (Current Location): 08/01/2022  [facility] and [place] Number:  Herbalist and Address:  [facility], [place] [address] [place], [place] [address]      Provider Number: [id number]  Attending Physician Name and Address:  Gaye Pollack, MD  Relative Name and Phone Number:       Current Level of Care: Hospital Recommended Level of Care: [place] Prior Approval Number:    Date Approved/Denied:   PASRR Number: [id number]  Discharge Plan: SNF    Current Diagnoses: Patient Active Problem List   Diagnosis Date Noted   Persistent atrial fibrillation ([place])    S/P CABG x 3 08/03/2022   Coronary artery disease involving native coronary artery of native heart with unstable angina pectoris ([place])    Class 2 severe obesity with serious comorbidity and body mass index (BMI) of 37.0 to 37.9 in adult, unspecified obesity type ([place]) 10/27/2020   OSA (obstructive sleep apnea) 08/01/2018   Benign prostatic hyperplasia with nocturia 11/26/2016   Prostate cancer screening 11/26/2016   Medicare annual wellness visit, subsequent 11/26/2016   Hx of adenomatous colonic polyps 09/04/2015   Annual physical exam 07/20/2015   Visit for preventive health examination 05/04/2013   Erectile dysfunction 05/04/2013   Snoring 02/13/2012   GERD (gastroesophageal reflux disease) 08/12/2011   Osteoarthritis 08/08/2011   Hyperlipidemia 10/13/2007   Essential hypertension 10/13/2007    Orientation RESPIRATION BLADDER Height & Weight     Self, Time, Situation, Place  Normal Continent Weight: 289 lb 1.6 oz (131.1 kg) Height:  '6\' 4"'$  (193 cm)  BEHAVIORAL SYMPTOMS/MOOD NEUROLOGICAL BOWEL NUTRITION STATUS      Continent Diet (please see discharge summary)  AMBULATORY STATUS COMMUNICATION OF NEEDS Skin    Limited Assist Verbally Surgical wounds closed incision Leg, closed incision Chest                        Personal Care Assistance Level of Assistance  Bathing, Feeding, Dressing Bathing Assistance: Limited assistance Feeding assistance: Independent Dressing Assistance: Limited assistance     Functional Limitations Info  Sight, Hearing, Speech Sight Info: Adequate Hearing Info: Adequate Speech Info: Adequate    SPECIAL CARE FACTORS FREQUENCY  PT (By licensed PT), OT (By licensed OT)     PT Frequency: 5x per eek OT Frequency: 5x per week            Contractures Contractures Info: Not present    Additional Factors Info  Code Status, Allergies Code Status Info: FULL Allergies Info: Verapamil, HCTZ,Enalapril Maleate,Tribenzor           Current Medications (08/15/2022):  This is the current hospital active medication list Current Facility-Administered Medications  Medication Dose Route Frequency Provider Last Rate Last Admin   0.9 %  sodium chloride infusion  250 mL Intravenous PRN Gaye Pollack, MD       [START ON 08/23/2022] amiodarone (PACERONE) tablet 200 mg  200 mg Oral Daily Reome, Earle J, RPH       amiodarone (PACERONE) tablet 400 mg  400 mg Oral BID Reome, Earle J, RPH   400 mg at 08/15/22 0905   apixaban (ELIQUIS) tablet 5 mg  5 mg Oral BID Gaye Pollack, MD   5 mg at 08/15/22 0908   aspirin EC tablet 81 mg  81 mg Oral Daily Gaye Pollack, MD   81 mg at 08/15/22 0906   atorvastatin (LIPITOR) tablet 80 mg  80 mg Oral Daily Gaye Pollack, MD   80 mg at 08/15/22 0905   benazepril (LOTENSIN) tablet 20 mg  20 mg Oral QHS Stehler, Pricilla Larsson, PA-C   20 mg at 08/14/22 2240   bisacodyl (DULCOLAX) suppository 10 mg  10 mg Rectal Daily PRN Jadene Pierini E, PA-C       cefdinir (OMNICEF) capsule 300 mg  300 mg Oral Q12H Roddenberry, Myron G, PA-C   300 mg at 08/15/22 8676   docusate sodium (COLACE) capsule 100 mg  100 mg Oral Daily Gold, Wayne E, PA-C   100 mg at  08/15/22 7209   Ferrous Fumarate (HEMOCYTE - 106 mg FE) tablet 106 mg of iron  1 tablet Oral Daily Gaye Pollack, MD   106 mg of iron at 47/09/62 8366   And   folic acid-pyridoxine-cyancobalamin (FOLTX) 2.5-25-2 MG per tablet 1 tablet  1 tablet Oral Daily Gaye Pollack, MD   1 tablet at 08/15/22 0906   magnesium hydroxide (MILK OF MAGNESIA) suspension 30 mL  30 mL Oral Daily PRN Gold, Wayne E, PA-C       melatonin tablet 3 mg  3 mg Oral QHS PRN Gaye Pollack, MD   3 mg at 08/14/22 2240   metoprolol tartrate (LOPRESSOR) tablet 25 mg  25 mg Oral BID Geralynn Rile, MD   25 mg at 08/15/22 2947   naphazoline-glycerin (CLEAR EYES REDNESS) ophth solution 1-2 drop  1-2 drop Both Eyes QID PRN Gaye Pollack, MD   2 drop at 08/02/22 1435   ondansetron (ZOFRAN) injection 4 mg  4 mg Intravenous Q6H PRN Gaye Pollack, MD   4 mg at 08/04/22 6546   Oral care mouth rinse  15 mL Mouth Rinse PRN Gaye Pollack, MD       pantoprazole (PROTONIX) EC tablet 40 mg  40 mg Oral Daily Gaye Pollack, MD   40 mg at 08/15/22 0906   potassium chloride SA (KLOR-CON M) CR tablet 20 mEq  20 mEq Oral BID Gaye Pollack, MD   20 mEq at 08/15/22 0905   sodium chloride flush (NS) 0.9 % injection 3 mL  3 mL Intravenous Q12H Gaye Pollack, MD   3 mL at 08/15/22 0909   sodium chloride flush (NS) 0.9 % injection 3 mL  3 mL Intravenous PRN Gaye Pollack, MD       torsemide (DEMADEX) tablet 20 mg  20 mg Oral Daily Gaye Pollack, MD   20 mg at 08/15/22 0906   traMADol (ULTRAM) tablet 50 mg  50 mg Oral Q4H PRN Gaye Pollack, MD         Discharge Medications: Please see discharge summary for a list of discharge medications.  Relevant Imaging Results:  Relevant Lab Results:   Additional Information SSN [id number]  wears CPAP  Vinie Sill, LCSW

## 2022-08-15 NOTE — Progress Notes (Signed)
Mobility Specialist Progress Note:   08/15/22 0908  Mobility  Activity Ambulated with assistance in hallway  Level of Assistance Contact guard assist, steadying assist  Assistive Device Front wheel walker  Distance Ambulated (ft) 350 ft  Activity Response Tolerated well  $Mobility charge 1 Mobility   Pt received standing EOB after completing orthostatics. No complaints of pain/ dizziness. Left in chair with call bell in reach and all needs met.   [facility] Surveyor, mining Chat only

## 2022-08-15 NOTE — Progress Notes (Signed)
Cardiology Progress Note  Patient ID: MACLOVIO HENSON MRN: [id number] DOB: 08/17/1946 Date of Encounter: 08/15/2022  Primary Cardiologist: Lauree Chandler, MD  Subjective   Chief Complaint: None.   HPI: Maintaining sinus rhythm.  Vital signs stable.  Denies any symptoms.  ROS:  All other ROS reviewed and negative. Pertinent positives noted in the HPI.     Inpatient Medications  Scheduled Meds:  [START ON 08/23/2022] amiodarone  200 mg Oral Daily   amiodarone  400 mg Oral BID   apixaban  5 mg Oral BID   aspirin EC  81 mg Oral Daily   atorvastatin  80 mg Oral Daily   benazepril  20 mg Oral QHS   cefdinir  300 mg Oral Q12H   docusate sodium  100 mg Oral Daily   Ferrous Fumarate  1 tablet Oral Daily   And   folic acid-pyridoxine-cyancobalamin  1 tablet Oral Daily   metoprolol tartrate  25 mg Oral BID   pantoprazole  40 mg Oral Daily   potassium chloride  20 mEq Oral BID   sodium chloride flush  3 mL Intravenous Q12H   torsemide  20 mg Oral Daily   Continuous Infusions:  sodium chloride     PRN Meds: sodium chloride, bisacodyl, magnesium hydroxide, melatonin, naphazoline-glycerin, ondansetron (ZOFRAN) IV, mouth rinse, sodium chloride flush, traMADol   Vital Signs   Vitals:   08/14/22 2032 08/14/22 2330 08/15/22 0405 08/15/22 0852  BP: (!) 130/55 (!) 146/64 (!) 106/47 (!) 126/58  Pulse:  75 72 76  Resp: '20 20 20 20  '$ Temp: 98.3 F (36.8 C) 98.3 F (36.8 C) 98.3 F (36.8 C) 97.9 F (36.6 C)  TempSrc: Oral Oral Oral Oral  SpO2: 95% 97% 94% 95%  Weight:   131.1 kg   Height:        Intake/Output Summary (Last 24 hours) at 08/15/2022 1007 Last data filed at 08/14/2022 2000 Gross per 24 hour  Intake 240 ml  Output --  Net 240 ml      08/15/2022    4:05 AM 08/14/2022    3:25 AM 08/12/2022    3:29 AM  Last 3 Weights  Weight (lbs) 289 lb 1.6 oz 289 lb 3.2 oz 295 lb 11.2 oz  Weight (kg) 131.135 kg 131.18 kg 134.129 kg      Telemetry  Overnight telemetry  shows sinus rhythm in the 80s, which I personally reviewed.    Physical Exam   Vitals:   08/14/22 2032 08/14/22 2330 08/15/22 0405 08/15/22 0852  BP: (!) 130/55 (!) 146/64 (!) 106/47 (!) 126/58  Pulse:  75 72 76  Resp: '20 20 20 20  '$ Temp: 98.3 F (36.8 C) 98.3 F (36.8 C) 98.3 F (36.8 C) 97.9 F (36.6 C)  TempSrc: Oral Oral Oral Oral  SpO2: 95% 97% 94% 95%  Weight:   131.1 kg   Height:        Intake/Output Summary (Last 24 hours) at 08/15/2022 1007 Last data filed at 08/14/2022 2000 Gross per 24 hour  Intake 240 ml  Output --  Net 240 ml       08/15/2022    4:05 AM 08/14/2022    3:25 AM 08/12/2022    3:29 AM  Last 3 Weights  Weight (lbs) 289 lb 1.6 oz 289 lb 3.2 oz 295 lb 11.2 oz  Weight (kg) 131.135 kg 131.18 kg 134.129 kg    Body mass index is 35.19 kg/m.  General: Well nourished, well developed, in no acute  distress Head: Atraumatic, normal size  Eyes: PEERLA, EOMI  Neck: Supple, no JVD Endocrine: No thryomegaly Cardiac: Normal S1, S2; RRR; no murmurs, rubs, or gallops Lungs: Clear to auscultation bilaterally, no wheezing, rhonchi or rales  Abd: Soft, nontender, no hepatomegaly  Ext: Trace edema in the right lower extremity Musculoskeletal: No deformities, BUE and BLE strength normal and equal Skin: Warm and dry, no rashes   Neuro: Alert and oriented to person, place, time, and situation, CNII-XII grossly intact, no focal deficits  Psych: Normal mood and affect   Labs  High Sensitivity Troponin:  No results for input(s): "TROPONINIHS" in the last 720 hours.   Cardiac EnzymesNo results for input(s): "TROPONINI" in the last 168 hours. No results for input(s): "TROPIPOC" in the last 168 hours.  Chemistry Recent Labs  Lab 08/13/22 0057 08/14/22 0120 08/15/22 0146  NA 133* 134* 133*  K 3.5 3.9 3.8  CL 100 97* 99  CO2 '23 26 25  '$ GLUCOSE 177* 143* 149*  BUN '16 19 21  '$ CREATININE 0.89 0.34* 0.97  CALCIUM 8.8* 8.8* 9.0  GFRNONAA >60 >60 >60  ANIONGAP '10 11 9     '$ Hematology Recent Labs  Lab 08/12/22 0121 08/13/22 0057 08/15/22 0146  WBC 13.0* 15.8* 14.9*  RBC 3.05* 3.06* 3.03*  HGB 10.1* 9.9* 10.1*  HCT 31.5* 31.2* 30.0*  MCV 103.3* 102.0* 99.0  MCH 33.1 32.4 33.3  MCHC 32.1 31.7 33.7  RDW 15.2 15.0 15.4  PLT 367 446* 429*   BNPNo results for input(s): "BNP", "PROBNP" in the last 168 hours.  DDimer No results for input(s): "DDIMER" in the last 168 hours.   Radiology  DG Chest 2 View  Result Date: 08/14/2022 CLINICAL DATA:  History of CABG EXAM: CHEST - 2 VIEW COMPARISON:  Chest x-ray August 12, 2021. FINDINGS: Slightly improved small left pleural effusion with overlying left basilar opacity. Right lung is clear. No visible pneumothorax. Similar cardiomediastinal silhouette. Median sternotomy. IMPRESSION: Slightly improved small left pleural effusion with overlying left basilar atelectasis and/or consolidation. Electronically Signed   By: Margaretha Sheffield M.D.   On: 08/14/2022 10:03   ECHOCARDIOGRAM LIMITED  Result Date: 08/13/2022    ECHOCARDIOGRAM LIMITED REPORT   Patient Name:   WORLEY RADERMACHER Date of Exam: 08/13/2022 Medical Rec #:  [id number]        Height:       76.0 in Accession #:    [id number]       Weight:       295.7 lb Date of Birth:  1946/07/17       BSA:          2.616 m Patient Age:    76 years         BP:           123/67 mmHg Patient Gender: M                HR:           116 bpm. Exam Location:  Inpatient Procedure: 2D Echo, Cardiac Doppler, Color Doppler and Intracardiac            Opacification Agent Indications:    Atrial fibrillation  History:        Patient has prior history of Echocardiogram examinations, most                 recent 08/08/2022. Prior CABG, Arrythmias:Atrial Fibrillation;  Signs/Symptoms:Shortness of Breath. CABG 11/03/21. S/P TEE and                 cardioversion 08/08/22.  Sonographer:    Clayton Lefort RDCS (AE) Referring Phys: [id number] Makena  Sonographer Comments: Technically  challenging study due to limited acoustic windows, Technically difficult study due to poor echo windows, suboptimal parasternal window, suboptimal apical window, no subcostal window and patient is obese. IMPRESSIONS  1. Technically difficult study. Left ventricular ejection fraction, by estimation, is 60 to 65%. The left ventricle has normal function. The left ventricle has no regional wall motion abnormalities. There is moderate left ventricular hypertrophy. Left ventricular diastolic parameters are indeterminate.  2. Right ventricular systolic function was not well visualized. The right ventricular size is not well visualized.  3. A small pericardial effusion is present.  4. The mitral valve is normal in structure. No evidence of mitral valve regurgitation.  5. The aortic valve was not well visualized. Aortic valve regurgitation is not visualized. FINDINGS  Left Ventricle: Left ventricular ejection fraction, by estimation, is 60 to 65%. The left ventricle has normal function. The left ventricle has no regional wall motion abnormalities. Definity contrast agent was given IV to delineate the left ventricular  endocardial borders. The left ventricular internal cavity size was normal in size. There is moderate left ventricular hypertrophy. Left ventricular diastolic parameters are indeterminate. Right Ventricle: The right ventricular size is not well visualized. Right ventricular systolic function was not well visualized. Pericardium: A small pericardial effusion is present. Mitral Valve: The mitral valve is normal in structure. MV peak gradient, 3.7 mmHg. The mean mitral valve gradient is 1.0 mmHg. Aortic Valve: The aortic valve was not well visualized. Aortic valve regurgitation is not visualized. Additional Comments: Spectral Doppler performed. Color Doppler performed.  LEFT VENTRICLE PLAX 2D LVIDd:         4.30 cm     Diastology LVIDs:         2.80 cm     LV e' medial:    8.70 cm/s LV PW:         1.40 cm     LV  E/e' medial:  12.1 LV IVS:        1.40 cm     LV e' lateral:   6.74 cm/s                            LV E/e' lateral: 15.6  LV Volumes (MOD) LV vol d, MOD A2C: 43.4 ml LV vol d, MOD A4C: 61.3 ml LV vol s, MOD A2C: 15.3 ml LV vol s, MOD A4C: 21.6 ml LV SV MOD A2C:     28.1 ml LV SV MOD A4C:     61.3 ml LV SV MOD BP:      36.4 ml LEFT ATRIUM             Index LA diam:        3.70 cm 1.41 cm/m LA Vol (A2C):   42.4 ml 16.21 ml/m LA Vol (A4C):   34.9 ml 13.34 ml/m LA Biplane Vol: 38.5 ml 14.72 ml/m  MITRAL VALVE MV Area (PHT): 5.54 cm MV Peak grad:  3.7 mmHg MV Mean grad:  1.0 mmHg MV Vmax:       0.97 m/s MV Vmean:      48.6 cm/s MV Decel Time: 137 msec MV E velocity: 105.00 cm/s MV A velocity: 22.40 cm/s MV E/A ratio:  4.69 Oswaldo Milian MD Electronically signed by Oswaldo Milian MD Signature Date/Time: 08/13/2022/1:18:05 PM    Final     Cardiac Studies  TTE 08/13/2022  1. Technically difficult study. Left ventricular ejection fraction, by  estimation, is 60 to 65%. The left ventricle has normal function. The left  ventricle has no regional wall motion abnormalities. There is moderate  left ventricular hypertrophy. Left  ventricular diastolic parameters are indeterminate.   2. Right ventricular systolic function was not well visualized. The right  ventricular size is not well visualized.   3. A small pericardial effusion is present.   4. The mitral valve is normal in structure. No evidence of mitral valve  regurgitation.   5. The aortic valve was not well visualized. Aortic valve regurgitation  is not visualized.   Patient Profile  KYZEN HORN is a 77 y.o. male with obesity, CAD, hypertension who was admitted on 08/01/2022 with unstable angina.  Found to have three-vessel CAD status post CABG.  Course complicated by atrial fibrillation.  Status post TEE/cardioversion.    Assessment & Plan   #Postoperative A-fib -Remains on Amio.  Is undergone TEE/cardioversion since  surgery. -Diltiazem stopped by surgery due to thoughts this could be contributing to fatigue. -Maintaining sinus rhythm on amiodarone.  Plan for amiodarone load as recommended by EP.  We will continue this. -Continue metoprolol tartrate 25 mg twice daily. -Continue Eliquis 5 mg twice daily. -If rates become more difficult would recommend add back Cardizem.  For now he is maintaining sinus rhythm.  #CAD status post CABG #Pulmonary edema -Volume status acceptable.  Diuresis per surgery.  Overall doing well. -Continue aspirin and statin therapy.  On a beta-blocker.  Ejection fraction is normal.  [facility] will sign off.   Medication Recommendations: Medications as above Other recommendations (labs, testing, etc): None Follow up as an outpatient: We will arrange outpatient follow-up in 2 to 3 weeks  For questions or updates, please contact [place] Please consult [email or web address] for contact info under        Signed, [place] T. Audie Box, MD, [place]  08/15/2022 10:07 AM

## 2022-08-15 NOTE — Progress Notes (Signed)
Follow up arranged on 08/23/22 with cardiology per Dr Audie Box request, see AVS

## 2022-08-15 NOTE — Progress Notes (Signed)
Occupational Therapy Treatment Patient Details Name: Hector Edwards MRN: [id number] DOB: 1946-01-18 Today's Date: 08/15/2022   History of present illness Patient is 76 y.o. male S/p median sternotomy for CABGx3  with Rt greater saphenous vein and Lt internal mammary artery on 95/63/87 with complications of Afib in RVR following surgery and underwent TEE with CV on 08/08/22. PMH significant for depression, GERD, HTN, HLD, low back pain, OSA.   OT comments  Pt supine in bed, agreeable to OT session.  Reports continues to be weak and fatigued, possible medication adjustment happening today to decrease lethargy. Patient with friend at side, discussed SNF recommendations and pt voicing "I'm fine with either", friend concerned he does not have assist at home.  Per pt, roommate can maybe assist him but he "doesn't really know her" and he will have friends/family in and out.  Pt continues to require mod assist for bed mobility, min guard for transfers but up to min assist for mobility at times due to LOB. Grooming at sink with min guard, toileting with max assist.  Updated dc plan to SNF. Will follow acutely.    Recommendations for follow up therapy are one component of a multi-disciplinary discharge planning process, led by the attending physician.  Recommendations may be updated based on patient status, additional functional criteria and insurance authorization.    Follow Up Recommendations  Skilled nursing-short term rehab (<3 hours/day)    Assistance Recommended at Discharge Frequent or constant Supervision/Assistance  Patient can return home with the following  A little help with walking and/or transfers;Assistance with cooking/housework;Direct supervision/assist for medications management;Direct supervision/assist for financial management;Assist for transportation;Help with stairs or ramp for entrance;A little help with bathing/dressing/bathroom   Equipment Recommendations  BSC/3in1     Recommendations for Other Services      Precautions / Restrictions Precautions Precautions: Fall;Sternal Precaution Booklet Issued: Yes (comment) Precaution Comments: Poor adherence to precautions, requires frequent cues. Restrictions Other Position/Activity Restrictions: sternal precautions       Mobility Bed Mobility Overal bed mobility: Needs Assistance Bed Mobility: Rolling, Sidelying to Sit Rolling: Supervision Sidelying to sit: Mod assist       General bed mobility comments: cueing for technique to adhere to sternal precautions, mod assist to ascend trunk    Transfers Overall transfer level: Needs assistance Equipment used: Rolling walker (2 wheels) Transfers: Sit to/from Stand Sit to Stand: Min guard                 Balance Overall balance assessment: Needs assistance, Mild deficits observed, not formally tested Sitting-balance support: No upper extremity supported, Feet supported Sitting balance-Leahy Scale: Good     Standing balance support: Bilateral upper extremity supported, No upper extremity supported, During functional activity Standing balance-Leahy Scale: Fair Standing balance comment: relies on RW dynamically, able to engage in grooming without UE support but 1 LOB                           ADL either performed or assessed with clinical judgement   ADL Overall ADL's : Needs assistance/impaired     Grooming: Min guard;Minimal assistance;Standing;Oral care Grooming Details (indicate cue type and reason): 1 LOB requiring min assist to recover             Lower Body Dressing: Minimal assistance;Sit to/from stand   Toilet Transfer: Min guard;Ambulation;Rolling walker (2 wheels)           Functional mobility during ADLs: Minimal assistance;Min guard;Rolling walker (  2 wheels) General ADL Comments: today pt reports he really does not have assistance at home    Extremity/Trunk Assessment              Vision        Perception     Praxis      Cognition Arousal/Alertness: Awake/alert Behavior During Therapy: Flat affect Overall Cognitive Status: No family/caregiver present to determine baseline cognitive functioning Area of Impairment: Memory, Awareness, Problem solving, Safety/judgement                     Memory: Decreased recall of precautions   Safety/Judgement: Decreased awareness of deficits, Decreased awareness of safety Awareness: Emergent Problem Solving: Requires verbal cues, Difficulty sequencing, Slow processing General Comments: pt with slow processing and decreased problem sovling, cueing to adhere to sternal precautions during session. Improving awareness to deficits and need for assistance        Exercises      Shoulder Instructions       General Comments VSS on RA    Pertinent Vitals/ Pain       Pain Assessment Pain Assessment: No/denies pain Pain Intervention(s): Monitored during session  Home Living                                          Prior Functioning/Environment              Frequency  Min 2X/week        Progress Toward Goals  OT Goals(current goals can now be found in the care plan section)  Progress towards OT goals: Progressing toward goals  Acute Rehab OT Goals Patient Stated Goal: get stronger OT Goal Formulation: With patient Time For Goal Achievement: 08/25/22 Potential to Achieve Goals: Good  Plan Frequency remains appropriate;Discharge plan needs to be updated    Co-evaluation                 AM-PAC OT "6 Clicks" Daily Activity     Outcome Measure   Help from another person eating meals?: None Help from another person taking care of personal grooming?: A Little Help from another person toileting, which includes using toliet, bedpan, or urinal?: A Little Help from another person bathing (including washing, rinsing, drying)?: A Little Help from another person to put on and taking off regular  upper body clothing?: A Little Help from another person to put on and taking off regular lower body clothing?: A Little 6 Click Score: 19    End of Session Equipment Utilized During Treatment: Rolling walker (2 wheels)  OT Visit Diagnosis: Unsteadiness on feet (R26.81);Other abnormalities of gait and mobility (R26.89);Muscle weakness (generalized) (M62.81)   Activity Tolerance Patient tolerated treatment well   Patient Left in chair;with call bell/phone within reach;with family/visitor present   Nurse Communication Mobility status        Time: 4540-9811 OT Time Calculation (min): 18 min  Charges: OT General Charges $OT Visit: 1 Visit OT Treatments $Self Care/Home Management : 8-22 mins  Jolaine Artist, [place] Office [phone]   Delight Stare 08/15/2022, 11:36 AM

## 2022-08-15 NOTE — Progress Notes (Addendum)
[address]       [place] [address]             [phone]      7 Days Post-Op Procedure(s) (LRB): TRANSESOPHAGEAL ECHOCARDIOGRAM (TEE) (N/A) CARDIOVERSION (N/A) Subjective: Patient more fatigued today, states he has had increased dizziness with ambulation the last couple of days  Objective: Vital signs in last 24 hours: Temp:  [97.7 F (36.5 C)-98.3 F (36.8 C)] 98.3 F (36.8 C) (11/08 0405) Pulse Rate:  [72-108] 72 (11/08 0405) Cardiac Rhythm: Atrial flutter;Bundle branch block (11/07 1900) Resp:  [20-28] 20 (11/08 0405) BP: (106-146)/(47-65) 106/47 (11/08 0405) SpO2:  [94 %-97 %] 94 % (11/08 0405) Weight:  [131.1 kg] 131.1 kg (11/08 0405)  Hemodynamic parameters for last 24 hours:    Intake/Output from previous day: 11/07 0701 - 11/08 0700 In: 480 [P.O.:480] Out: -  Intake/Output this shift: No intake/output data recorded.  General appearance: cooperative, fatigued, and no distress Neurologic: intact Heart: regular rate and rhythm, S1, S2 normal, no murmur, click, rub or gallop Lungs: diminsihed bibasilar Abdomen: soft, non-tender; bowel sounds normal; no masses,  no organomegaly Extremities: extremities normal, atraumatic, no cyanosis or edema and expected bruising of the RLE Wound: Clean, dry, intact  Lab Results: Recent Labs    08/13/22 0057 08/15/22 0146  WBC 15.8* 14.9*  HGB 9.9* 10.1*  HCT 31.2* 30.0*  PLT 446* 429*   BMET:  Recent Labs    08/14/22 0120 08/15/22 0146  NA 134* 133*  K 3.9 3.8  CL 97* 99  CO2 26 25  GLUCOSE 143* 149*  BUN 19 21  CREATININE 0.34* 0.97  CALCIUM 8.8* 9.0    PT/INR: No results for input(s): "LABPROT", "INR" in the last 72 hours. ABG    Component Value Date/Time   PHART 7.342 (L) 08/03/2022 2048   HCO3 19.6 (L) 08/03/2022 2048   TCO2 21 (L) 08/03/2022 2048   ACIDBASEDEF 5.0 (H) 08/03/2022 2048   O2SAT 97 08/03/2022 2048   CBG (last 3)  No results for input(s): "GLUCAP" in the  last 72 hours.  Assessment/Plan: S/P Procedure(s) (LRB): TRANSESOPHAGEAL ECHOCARDIOGRAM (TEE) (N/A) CARDIOVERSION (N/A)  CV: hx of afib/flutter, POD 7 DCCV. Run of aflutter last night controlled rate. In NSR, rate 70's this AM. On diltiazem, amiodarone, motoprolol and eliquis. Dizziness with ambulation and fatigue, possibly due to medications. Will get orthostatic BP's today. Cardiology following, appreciate assistance with this. Labile BP, SBP 106-146, on benazepril.   Pulm: Saturating 94% on RA. CXR yesterday improved with diuresis. Continue ambulation and IS.   GI: Large BM two days ago. Good appetite.  Renal: On PO demadex, diuresing well. 11lbs under preop wt. No I/Os today. Hyponatremia stable, continue supplementing K. Cr stable 0.97.   ID: Leokocytosis improving  Expected postop ABLA: stable 10.1/30  Superficial Phlebitis: Likely due to amiodarone infusion on LUE. Improved, on cefdinir x 1 week  Deconditioning: Increased weakness, fatigue and dizziness. Continue work with PT/OT. Now recommending SNF.   Dispo: PT/OT now recommending SNF due to deconditioning. TOC consulted for SNF placement assistance.    LOS: 14 days    [name], PA-C 08/15/2022   Chart reviewed, patient examined, agree with above. I reviewed the rhythm on monitor and I don't think it was atrial flutter.   He remains very fatigued and sleeps a lot. I stopped his oxycodone. I am concerned that cardizem CD may be contributing to this so I stopped it. Will  continue amiodarone and Lopressor. Wt is below preop but he had small pleural effusion on the left. Will decrease Demedex to 20 mg daily to try to avoid over-diuresis.

## 2022-08-15 NOTE — Progress Notes (Signed)
CARDIAC REHAB PHASE I   PRE:  Rate/Rhythm: 75 NSR  BP:  Sitting: 119/56      SaO2: 93 RA  MODE:  Ambulation: 375 ft   POST:  Rate/Rhythm: 90 NSR  BP:  Sitting: 136/58      SaO2: 97 RA  Pt ambulated 322f with RW, pt needed contact guard assistance using gait belt. Pt walked well, complaining of minimal SOB, quoting "I'm feeling stronger" while ambulating. Pt took one standing rest break at 200 ft before continuing back to room. Pt was helped back to bed with call button in reach.  JChristen Bame 3:13 PM 08/15/2022

## 2022-08-16 LAB — GLUCOSE, CAPILLARY: Glucose-Capillary: 117 mg/dL — ABNORMAL HIGH (ref 70–99)

## 2022-08-16 MED ORDER — DILTIAZEM HCL ER COATED BEADS 180 MG PO CP24
180.0000 mg | ORAL_CAPSULE | Freq: Every day | ORAL | Status: DC
  Administered 2022-08-16 – 2022-08-18 (×3): 180 mg via ORAL
  Filled 2022-08-16 (×3): qty 1

## 2022-08-16 MED FILL — Heparin Sodium (Porcine) Inj 1000 Unit/ML: INTRAMUSCULAR | Qty: 10 | Status: AC

## 2022-08-16 MED FILL — Mannitol IV Soln 20%: INTRAVENOUS | Qty: 500 | Status: AC

## 2022-08-16 MED FILL — Electrolyte-R (PH 7.4) Solution: INTRAVENOUS | Qty: 4000 | Status: AC

## 2022-08-16 MED FILL — Sodium Bicarbonate IV Soln 8.4%: INTRAVENOUS | Qty: 50 | Status: AC

## 2022-08-16 MED FILL — Lidocaine HCl Local Soln Prefilled Syringe 100 MG/5ML (2%): INTRAMUSCULAR | Qty: 5 | Status: AC

## 2022-08-16 MED FILL — Sodium Chloride IV Soln 0.9%: INTRAVENOUS | Qty: 2000 | Status: AC

## 2022-08-16 NOTE — Progress Notes (Addendum)
[address]       [place] [address]             [phone]      8 Days Post-Op Procedure(s) (LRB): TRANSESOPHAGEAL ECHOCARDIOGRAM (TEE) (N/A) CARDIOVERSION (N/A) Subjective: Patient states he is felt much better yesterday and thinks if he continues to improve as much as he did yesterday he will be ready to go home. He states his dizziness, weakness and shortness of breath have all improved. Patient much more alert this morning.   Objective: Vital signs in last 24 hours: Temp:  [97.9 F (36.6 C)-99 F (37.2 C)] 98.5 F (36.9 C) (11/09 0637) Pulse Rate:  [72-78] 78 (11/08 2020) Cardiac Rhythm: Normal sinus rhythm;Bundle branch block (11/08 1906) Resp:  [15-22] 15 (11/09 0637) BP: (119-126)/(49-71) 123/71 (11/09 0637) SpO2:  [93 %-96 %] 96 % (11/08 2020)  Hemodynamic parameters for last 24 hours:    Intake/Output from previous day: 11/08 0701 - 11/09 0700 In: -  Out: 1200 [Urine:1200] Intake/Output this shift: No intake/output data recorded.  General appearance: alert, cooperative, and no distress Neurologic: intact Heart: regular rate and rhythm, S1, S2 normal, no murmur, click, rub or gallop Lungs: clear to auscultation bilaterally Abdomen: soft, non-tender; bowel sounds normal; no masses,  no organomegaly Extremities: extremities normal, atraumatic, no cyanosis or edema and expected bruising RLE Wound: Clean, dry, intact  Lab Results: Recent Labs    08/15/22 0146  WBC 14.9*  HGB 10.1*  HCT 30.0*  PLT 429*   BMET:  Recent Labs    08/14/22 0120 08/15/22 0146  NA 134* 133*  K 3.9 3.8  CL 97* 99  CO2 26 25  GLUCOSE 143* 149*  BUN 19 21  CREATININE 0.34* 0.97  CALCIUM 8.8* 9.0    PT/INR: No results for input(s): "LABPROT", "INR" in the last 72 hours. ABG    Component Value Date/Time   PHART 7.342 (L) 08/03/2022 2048   HCO3 19.6 (L) 08/03/2022 2048   TCO2 21 (L) 08/03/2022 2048   ACIDBASEDEF 5.0 (H) 08/03/2022 2048   O2SAT  97 08/03/2022 2048   CBG (last 3)  Recent Labs    08/16/22 0628  GLUCAP 117*    Assessment/Plan: S/P Procedure(s) (LRB): TRANSESOPHAGEAL ECHOCARDIOGRAM (TEE) (N/A) CARDIOVERSION (N/A)  CV: hx of afib/flutter, POD 8 DCCV. Converted to atrial fibrillation for about an hour this AM, rates 100-110's. In NSR when I checked on him, rate 70-80's. Discontinued diltiazem yesterday. On amiodarone, motoprolol and eliquis. Dizziness and weakness improved since discontinuing diltiazem. SBP 123, on benazepril. Cardiology following, appreciate assistance with this.    Pulm: Saturating 96% on RA. Shortness of breath improved. Continue ambulation and IS.    GI: +BM yesterday. Improved appetite.   Renal: On PO demadex '20mg'$  QD. Weight is stable. Hyponatremia stable, continue supplementing K. Cr stable 0.97.    ID: Leokocytosis improving   Expected postop ABLA: stable 10.1/30   Superficial Phlebitis: Likely due to amiodarone infusion on LUE. Improved, on cefdinir x 1 week   Deconditioning: Decreased weakness, fatigue and dizziness since discontinuing diltiazem. Ambulated 3 times yesterday. Continue work with PT/OT. Now recommending SNF.    Dispo: PT/OT now recommending SNF due to deconditioning. TOC consulted for SNF placement assistance, awaiting evaluation/placement   LOS: 15 days    [name], PA-C 08/16/2022   Chart reviewed, patient examined, agree with above. He looks much better today. More alert and walked 300 ft with mobility team this afternoon.  He has had recurrent AF with RVR since Cardizem stopped but it was restarted at a lower dose today. Hopefully he will tolerate that better. Working on SNF placement.

## 2022-08-16 NOTE — TOC Progression Note (Signed)
Transition of Care [facility]) - Progression Note    Patient Details  Name: Hector Edwards MRN: [id number] Date of Birth: [birth year]  Transition of Care [facility]) CM/SW [place], [place] Phone Number: 08/16/2022, 3:42 PM  Clinical Narrative:     2:29pm- CSW met with patient at bedside w/his sister, [name] on speaker phone- family and patient discussed bed offers and was agreeable to [facility].  CSW contacted [facility]- SNF confirmed bed offer & will start insurance authorization.   1:20pm-CSW spoke with the patient's sister, [name] at his request- informed of bed offers- she  chose [facility] .    TOC will continue to follow and assist with discharge planning.   Thurmond Butts, MSW, LCSW Clinical Social Worker    Expected Discharge Plan: [place] Barriers to Discharge: Barriers Resolved  Expected Discharge Plan and Services Expected Discharge Plan: [place] In-house Referral: NA Discharge Planning Services: CM Consult Post Acute Care Choice: [place] arrangements for the past 2 months: Single Family Home Expected Discharge Date: 08/12/22               DME Arranged: 3-N-1, Patient refused services DME Agency: NA       HH Arranged: PT, OT [place] Agency: [place] Date [place]: 08/12/22 Time [place]: [place] Representative spoke with at [place]: [place] Determinants of Health ([place]) Interventions    Readmission Risk Interventions    08/12/2022   10:16 AM  Readmission Risk Prevention Plan  Post Dischage Appt Complete  Medication Screening Complete  Transportation Screening Complete

## 2022-08-16 NOTE — Progress Notes (Signed)
Physical Therapy Treatment Patient Details Name: Hector Edwards MRN: [id number] DOB: 1945/12/09 Today's Date: 08/16/2022   History of Present Illness Patient is 76 y.o. 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 PMH significant for depression, GERD, HTN, HLD, low back pain, OSA.    PT Comments    Pt is demonstrating improved recall of his sternal precautions along with improved strength through being able to transition sidelying > sit EOB with min guard assist today. He was also able to transfer to stand and ambulate with a RW at a min guard assist level. However, he continues to demonstrate deficits in static and dynamic balance that place him at risk for falls. In addition, his BP did decline with prolonged static standing and he became tachycardic with x2 sit <> stand reps, see below. Pt and his friend report someone could assist him intermittently throughout the day, but would prefer pt were more stable and independent before returning home due to his risk for falls and inability to have someone assist him 24/7. Will continue to follow acutely. Current recommendations remain appropriate.    BP:  147/70 supine 151/81 sitting 146/89 standing 126/77 standing ~3 min (mild lightheadness noted) 142/74 after ambulating 136/82 after x2 sit <> stand reps  *HR as high as 100s with gait, but jumped up to 160 briefly with x2 sit <> stand reps then recovered to 90s    Recommendations for follow up therapy are one component of a multi-disciplinary discharge planning process, led by the attending physician.  Recommendations may be updated based on patient status, additional functional criteria and insurance authorization.  Follow Up Recommendations  Skilled nursing-short term rehab (<3 hours/day) Can patient physically be transported by  private vehicle: Yes   Assistance Recommended at Discharge Frequent or constant Supervision/Assistance  Patient can return home with the following A little help with walking and/or transfers;A little help with bathing/dressing/bathroom;Assistance with cooking/housework;Assist for transportation;Help with stairs or ramp for entrance   Equipment Recommendations  Rolling walker (2 wheels)    Recommendations for Other Services       Precautions / Restrictions Precautions Precautions: Fall;Sternal Precaution Booklet Issued: Yes (comment) Precaution Comments: Requires cues to maintain sternal precautions; watch BP Restrictions Weight Bearing Restrictions: Yes RUE Partial Weight Bearing Percentage or Pounds: sternal precautions LUE Partial Weight Bearing Percentage or Pounds: sternal precautions Other Position/Activity Restrictions: sternal precautions     Mobility  Bed Mobility Overal bed mobility: Needs Assistance Bed Mobility: Rolling, Sidelying to Sit Rolling: Supervision Sidelying to sit: Min guard, HOB elevated       General bed mobility comments: Cues to maintain sternal precautions with pt using bed rail with HOB elevated to roll and transition sidelying > sit EOB. min guard for safety    Transfers Overall transfer level: Needs assistance Equipment used: Rolling walker (2 wheels) Transfers: Sit to/from Stand Sit to Stand: Min guard           General transfer comment: Sit to stand 1x from EOB and 2x from recliner, cuing for hand placement to maintain precautions. No LOB, but trunk sway noted, min guard for safety    Ambulation/Gait Ambulation/Gait assistance: Min guard Gait Distance (Feet): 135 Feet Assistive device: Rolling walker (2 wheels) Gait Pattern/deviations: Step-through pattern, Decreased stride length, Narrow base of support Gait velocity: Decreased Gait velocity interpretation: 1.31 - 2.62 ft/sec,  indicative of limited community ambulator   General  Gait Details: Pt ambulating with RW, demonstrating narrow BOS but with lower etxremities externally rotated bil. Mild instability noted, but no LOB, min guard for safety   Stairs             Wheelchair Mobility    Modified Rankin (Stroke Patients Only)       Balance Overall balance assessment: Needs assistance, Mild deficits observed, not formally tested Sitting-balance support: No upper extremity supported, Feet supported Sitting balance-Leahy Scale: Good     Standing balance support: Bilateral upper extremity supported, No upper extremity supported, During functional activity Standing balance-Leahy Scale: Fair Standing balance comment: relies on RW dynamically, able to stand statically without UE support                            Cognition Arousal/Alertness: Awake/alert Behavior During Therapy: Flat affect Overall Cognitive Status: Difficult to assess Area of Impairment: Safety/judgement, Problem solving                         Safety/Judgement: Decreased awareness of safety   Problem Solving: Slow processing General Comments: Pt able to identify to not push or pull with UEs, but still needs intermittent cues to maintain sternal precautions throughout session. Friend present, reporting pt's cognition is much better now compared to what it had been the last couple days. Pt HOH, hears better out of R ear. Questionable delay in processing due to [facility] or due to impaired cognition. Pt needs reminders to let therapist know if he becomes lightheaded        Exercises Other Exercises Other Exercises: sit <> stand x2 reps, limited further by tachycardia and pt feeling lightheaded    General Comments General comments (skin integrity, edema, etc.): BP: 147/70 supine, 151/81 sitting, 146/89 standing, 126/77 standing ~3 min (mild lightheadness noted), 142/74 after ambulating, 136/82 after x2 sit <> stand reps; HR as high as 100s with gait, but jumped up to 160  briefly with x2 sit <> stand reps then recovered to 90s      Pertinent Vitals/Pain Pain Assessment Pain Assessment: Faces Faces Pain Scale: No hurt Pain Intervention(s): Monitored during session    Home Living                          Prior Function            PT Goals (current goals can now be found in the care plan section) Acute Rehab PT Goals Patient Stated Goal: to become more independent again PT Goal Formulation: With patient/family Time For Goal Achievement: 08/25/22 Potential to Achieve Goals: Good Progress towards PT goals: Progressing toward goals    Frequency    Min 3X/week      PT Plan Equipment recommendations need to be updated    Co-evaluation              AM-PAC PT "6 Clicks" Mobility   Outcome Measure  Help needed turning from your back to your side while in a flat bed without using bedrails?: A Little Help needed moving from lying on your back to sitting on the side of a flat bed without using bedrails?: A Little Help needed moving to and from a bed to a chair (including a wheelchair)?: A Little Help needed standing up from a chair using your arms (e.g., wheelchair or bedside chair)?: A  Little Help needed to walk in hospital room?: A Little Help needed climbing 3-5 steps with a railing? : A Little 6 Click Score: 18    End of Session Equipment Utilized During Treatment: Gait belt Activity Tolerance: Patient tolerated treatment well Patient left: in chair;with call bell/phone within reach;with chair alarm set;with family/visitor present   PT Visit Diagnosis: Muscle weakness (generalized) (M62.81);Difficulty in walking, not elsewhere classified (R26.2);Unsteadiness on feet (R26.81);Other abnormalities of gait and mobility (R26.89)     Time: 9407-6808 PT Time Calculation (min) (ACUTE ONLY): 36 min  Charges:  $Gait Training: 8-22 mins $Therapeutic Activity: 8-22 mins                     Moishe Spice, PT, DPT Acute  Rehabilitation Services  Office: [place] 08/16/2022, 12:36 PM

## 2022-08-16 NOTE — Progress Notes (Signed)
While rounding this a.m., patient noted to be in afib rate in low hundreds, Patient is awake and comfortable. Will continue to monitor.

## 2022-08-16 NOTE — Progress Notes (Signed)
Mobility Specialist Progress Note:   08/16/22 1540  Mobility  Activity Ambulated with assistance in hallway  Level of Assistance Standby assist, set-up cues, supervision of patient - no hands on  Assistive Device Front wheel walker  Distance Ambulated (ft) 300 ft  Activity Response Tolerated well  $Mobility charge 1 Mobility   Pt received in bed willing to participate in mobility. No complaints of pain. Left in chair with call bell in reach and all needs met.   [facility] Surveyor, mining Chat only

## 2022-08-17 LAB — BASIC METABOLIC PANEL
Anion gap: 9 (ref 5–15)
BUN: 18 mg/dL (ref 8–23)
CO2: 24 mmol/L (ref 22–32)
Calcium: 9 mg/dL (ref 8.9–10.3)
Chloride: 101 mmol/L (ref 98–111)
Creatinine, Ser: 0.94 mg/dL (ref 0.61–1.24)
GFR, Estimated: >60 mL/min (ref >60)
Glucose, Bld: 168 mg/dL — ABNORMAL HIGH (ref 70–99)
Potassium: 4 mmol/L (ref 3.5–5.1)
Sodium: 134 mmol/L — ABNORMAL LOW (ref 135–145)

## 2022-08-17 LAB — CBC
HCT: 31.4 % — ABNORMAL LOW (ref 39.0–52.0)
Hemoglobin: 10 g/dL — ABNORMAL LOW (ref 13.0–17.0)
MCH: 32.2 pg (ref 26.0–34.0)
MCHC: 31.8 g/dL (ref 30.0–36.0)
MCV: 101 fL — ABNORMAL HIGH (ref 80.0–100.0)
Platelets: 387 10*3/uL (ref 150–400)
RBC: 3.11 MIL/uL — ABNORMAL LOW (ref 4.22–5.81)
RDW: 15.4 % (ref 11.5–15.5)
WBC: 11.6 10*3/uL — ABNORMAL HIGH (ref 4.0–10.5)
nRBC: 0 % (ref 0.0–0.2)

## 2022-08-17 NOTE — Discharge Summary (Signed)
[address]       [place] [address]             [phone]    Physician Discharge Summary  Patient ID: Hector Edwards MRN: [id number] DOB/AGE: 1946-04-25 76 y.o.  Admit date: 08/01/2022 Discharge date: 08/18/2022  Admission Diagnoses:  Patient Active Problem List   Diagnosis Date Noted   Persistent atrial fibrillation [facility])    Coronary artery disease involving native coronary artery of native heart with unstable angina pectoris (HCC)    Class 2 severe obesity with serious comorbidity and body mass index (BMI) of 37.0 to 37.9 in adult, unspecified obesity type ([place]) 10/27/2020   OSA (obstructive sleep apnea) 08/01/2018   Benign prostatic hyperplasia with nocturia 11/26/2016   Prostate cancer screening 11/26/2016   Medicare annual wellness visit, subsequent 11/26/2016   Hx of adenomatous colonic polyps 09/04/2015   Annual physical exam 07/20/2015   Visit for preventive health examination 05/04/2013   Erectile dysfunction 05/04/2013   Snoring 02/13/2012   GERD (gastroesophageal reflux disease) 08/12/2011   Osteoarthritis 08/08/2011   Hyperlipidemia 10/13/2007   Essential hypertension 10/13/2007     Discharge Diagnoses:  Patient Active Problem List   Diagnosis Date Noted   Persistent atrial fibrillation ([place])    S/P CABG x 3 08/03/2022   Coronary artery disease involving native coronary artery of native heart with unstable angina pectoris (HCC)    Class 2 severe obesity with serious comorbidity and body mass index (BMI) of 37.0 to 37.9 in adult, unspecified obesity type ([place]) 10/27/2020   OSA (obstructive sleep apnea) 08/01/2018   Benign prostatic hyperplasia with nocturia 11/26/2016   Prostate cancer screening 11/26/2016   Medicare annual wellness visit, subsequent 11/26/2016   Hx of adenomatous colonic polyps 09/04/2015   Annual physical exam 07/20/2015   Visit for preventive health examination 05/04/2013   Erectile dysfunction 05/04/2013    Snoring 02/13/2012   GERD (gastroesophageal reflux disease) 08/12/2011   Osteoarthritis 08/08/2011   Hyperlipidemia 10/13/2007   Essential hypertension 10/13/2007     Discharged Condition: good  History of Present Illness:     [name] is a 76 year old male with a  past medical history of HTN, HLD, obesity, GERD, depression, sleep apnea (does not tolerate CPAP), CAD and mild aortic stenosis. He presented to the cardiologist on 06/13/22 complaining of worsening dyspnea on exertion and fatigue for about 1 year as well as occasional LE swelling. He was seen by Dr. Irish Lack and underwent an echo in July 2017 which showed mild LVH and no valvular disease. Nuclear stress test was also done in 2017 and showed no ischemia. Echocardiogram on 06/25/19 showed LVEF 55-60% with mild aortic valve sclerosis but no stenosis at this time. Echocardiogram on 07/05/22 showed LVEF 60-65% and mild aortic stenosis with a mean gradient of 12. Coronary CTA on 07/24/22 showed severe disease in the LAD and RCA. He underwent a cardiac catheterization on 08/01/22 which revealed severe distal left main/three vessel CAD. Including severe, heavily calcified eccentric distal left main stenosis 80%, severe mid LAD stenosis 80%, severe ostial Circumflex stenosis 70%, and large dominant RCA with severe ostial stenosis 80%, severe mid to distal RCA stenosis 90%, RPAV 99% stenosis, RPDA 80% stenosis. The patient states overall for the past year he has been increasingly fatigued and short of breath with exertion but is still able to mow the lawn. He denies chest pain, orthopnea, and dizziness. He is currently resting comfortably in bed  and denies dyspnea and chest pain. He has  family history of heart disease and MI in his father and brother. He has no history of varicose veins. 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  Dr. Cyndia Bent reviewed the patient's chart, labs and diagnostic studies and determined surgical  revascularization would provide the patient the best long term treatment. Dr. Caffie Pinto reviewed Mr. Bouch treatment options as well as the risks and benefits of surgery. Mr. Gulyas was agreeable to proceed with coronary artery bypass grafting surgery.   Hospital Course: The patient was an inpatient at [facility] and was brought to the operating room on 08/03/22. He underwent a CABG x 3 utilizing LIMA to LAD, RSVG to PDA and RSVG to PLA. He tolerated the procedure well and was transferred to the SICU in stable condition. He was extubated later the evening of surgery. He was weaned off the Neo Synephrine drip. He was volume overloaded. He was started on Lopressor. He was transitioned off the Insulin drip. His pre op HGA1C was 5.4. Accu checks and SS will be stopped after transfer. EPW were removed on 10/29.  He developed Atrial Fibrillation with RVR.  He was treated with IV Amiodarone bolus and drip protocol.  He responded well to diuretics with weight returning to baseline.  Due to this diuretics were discontinued.  He did not convert to NSR despite IV Amiodarone.  He was started on oral cardizem and digoxin to assist with rate control.  EP consult was obtained.  He was started on Eliquis. Despite medication regimen patient's Atrial fibrillation/flutter persisted.  He underwent cardioversion by EP on 08/08/2022.  He has maintained NSR since cardioversion.  He was transitioned off IV amiodarone to an oral regimen. He was stable for transfer to the progressive care unit on 08/09/2022.  He had a first degree heart block but remained in NSR. He remained on cardizem and beta blocker was held. CXR showed trivial bilateral pleural effusions, left greater than right this was monitored. He was not volume overloaded and was saturating well on room air. He remained hypertensive so his home Benazepril was restarted at 21m. His incisions were healing well. He had minimal ambulation since surgery so a PT/OT consult  was obtained and [facility]. He had a few short runs of rate controlled atrial fibrillation but converted to normal sinus rhythm without difficulty. CXR showed prominent central pulmonary vessels suggesting CHF and increased small to moderate bilateral pleural effusions which were treated with one dose of IV diuretic and he was restarted on a daily oral diuretic. The patient developed LUE superficial phlebitis around his peripheral IV site likely due to amiodarone infusion and was started on 1 week of oral cefdinir as well as 3x daily warm compresses. Patient became diaphoretic and presyncopal during stair training with PT/OT. He admits that he will only have assistance at home for one night and lives alone in a 3 story home. PT/OT reported a decline in functional status and updated their discharge recommendation to SNF. TOC was consulted for assistance with SNF placement. Cardiology ordered an echocardiogram due to the findings on CXR. Echocardiogram on 11/06 was suboptimal due to the patient's obesity, rapid atrial fibrillation and recent surgery but showed LVEF 60-65%. He continued to convert in and out of atrial fibrillation. Per cardiology he was started on metoprolol tartrate 240mBID for better rate control. CXR on 11/07 showed improved left pleural effusion after  aggressive diuresis. He remained fatigued and lethargic so his diltiazem and pain medication was discontinued. Patient's weakness and dizziness improved after discontinuing diltiazem. He converted to atrial fibrillation with RVR. Low dose diltiazem was restarted. He converted to normal sinus rhythm and tolerated diltiazem 137m. He progressed well and deconditioning improved. His incisions were healing well.  He declined SNF placement.  Home health arrangements have been made. He was felt medically stable for discharge.  Consults: cardiology and EP  Significant Diagnostic Studies:  LEFT  HEART CATH AND CORONARY ANGIOGRAPHY   Conclusion      Mid RCA to Dist RCA lesion is 90% stenosed.   Ost RCA to Prox RCA lesion is 80% stenosed.   RPAV lesion is 99% stenosed.   RPDA lesion is 80% stenosed.   Mid LM to Dist LM lesion is 80% stenosed.   Ost Cx to Prox Cx lesion is 70% stenosed.   Dist LM to Ost LAD lesion is 80% stenosed.   Prox LAD to Mid LAD lesion is 60% stenosed.   Mid LAD lesion is 80% stenosed.   Severe distal left main/three vessel CAD Severe, heavily calcified eccentric distal left main stenosis Severe ostial LAD stenosis. Severe mid LAD stenosis Severe ostial Circumflex stenosis.  Large dominant RCA with severe ostial stenosis. Severe mid to distal vessel stenosis. Severe stenosis in the proximal posterolateral artery. Severe ostial PDA stenosis.  LVEDP 18 mmHg   Recommendations: Severe left main and three vessel. Will admit to telemetry and consult CT surgery to review candidacy for bypass surgery. Will add statin, beta blocker and ASA.   ECHOCARDIOGRAM LIMITED REPORT       Patient Name:   ELAVON BOTHWELLDate of Exam: 08/13/2022  Medical Rec #:  [id number]       Height:       76.0 in  Accession #:    [id number]      Weight:       295.7 lb  Date of Birth:  [birth year]      BSA:          2.616 m  Patient Age:    89years         BP:           123/67 mmHg  Patient Gender: M                HR:           116 bpm.  Exam Location:  Inpatient   Procedure: 2D Echo, Cardiac Doppler, Color Doppler and Intracardiac             Opacification Agent   Indications:    Atrial fibrillation    History:        Patient has prior history of Echocardiogram examinations,  most                 recent 08/08/2022. Prior CABG, Arrythmias:Atrial  Fibrillation;                 Signs/Symptoms:Shortness of Breath. CABG 11/03/21. S/P TEE  and                 cardioversion 08/08/22.    Sonographer:    AClayton LefortRDCS (AE)  Referring Phys: [id number]     Sonographer Comments: Technically challenging study due to limited  acoustic windows, Technically difficult study due to poor echo windows,  suboptimal parasternal window, suboptimal apical window, no subcostal  window and patient is obese.  IMPRESSIONS     1. Technically difficult study. Left ventricular ejection fraction, by  estimation, is 60 to 65%. The left ventricle has normal function. The left  ventricle has no regional wall motion abnormalities. There is moderate  left ventricular hypertrophy. Left  ventricular diastolic parameters are indeterminate.   2. Right ventricular systolic function was not well visualized. The right  ventricular size is not well visualized.   3. A small pericardial effusion is present.   4. The mitral valve is normal in structure. No evidence of mitral valve  regurgitation.   5. The aortic valve was not well visualized. Aortic valve regurgitation  is not visualized.   FINDINGS   Left Ventricle: Left ventricular ejection fraction, by estimation, is 60  to 65%. The left ventricle has normal function. The left ventricle has no  regional wall motion abnormalities. Definity contrast agent was given IV  to delineate the left ventricular   endocardial borders. The left ventricular internal cavity size was normal  in size. There is moderate left ventricular hypertrophy. Left ventricular  diastolic parameters are indeterminate.   Right Ventricle: The right ventricular size is not well visualized. Right  ventricular systolic function was not well visualized.   Pericardium: A small pericardial effusion is present.   Mitral Valve: The mitral valve is normal in structure. MV peak gradient,  3.7 mmHg. The mean mitral valve gradient is 1.0 mmHg.   Aortic Valve: The aortic valve was not well visualized. Aortic valve  regurgitation is not visualized.   Additional Comments: Spectral Doppler performed. Color Doppler performed.    LEFT VENTRICLE  PLAX 2D   LVIDd:         4.30 cm     Diastology  LVIDs:         2.80 cm     LV e' medial:    8.70 cm/s  LV PW:         1.40 cm     LV E/e' medial:  12.1  LV IVS:        1.40 cm     LV e' lateral:   6.74 cm/s                             LV E/e' lateral: 15.6    LV Volumes (MOD)  LV vol d, MOD A2C: 43.4 ml  LV vol d, MOD A4C: 61.3 ml  LV vol s, MOD A2C: 15.3 ml  LV vol s, MOD A4C: 21.6 ml  LV SV MOD A2C:     28.1 ml  LV SV MOD A4C:     61.3 ml  LV SV MOD BP:      36.4 ml   LEFT ATRIUM             Index  LA diam:        3.70 cm 1.41 cm/m  LA Vol (A2C):   42.4 ml 16.21 ml/m  LA Vol (A4C):   34.9 ml 13.34 ml/m  LA Biplane Vol: 38.5 ml 14.72 ml/m   MITRAL VALVE  MV Area (PHT): 5.54 cm  MV Peak grad:  3.7 mmHg  MV Mean grad:  1.0 mmHg  MV Vmax:       0.97 m/s  MV Vmean:      48.6 cm/s  MV Decel Time: 137 msec  MV E velocity: 105.00 cm/s  MV A velocity: 22.40 cm/s  MV E/A  ratio:  4.69   Oswaldo Milian MD  Electronically signed by Oswaldo Milian MD  Signature Date/Time: 08/13/2022/1:18:05 PM        Final      Treatments: surgery:                                                                                               CARDIOVASCULAR SURGERY OPERATIVE NOTE   08/03/2022   Surgeon:  Gaye Pollack, MD   First Assistant: Wynelle Beckmann PA-C and Enid Cutter PA-C:   An experienced assistant was required given the complexity of this surgery and the standard of surgical care. The assistant was needed for saphenous vein harvesting, exposure, dissection, suctioning, retraction of delicate tissues and sutures, instrument exchange and for overall help during this procedure.     Preoperative Diagnosis:  Severe left main and multi-vessel coronary artery disease     Postoperative Diagnosis:  Same     Procedure:   Median Sternotomy Extracorporeal circulation 3.   Coronary artery bypass grafting x 3   Left internal mammary artery graft to the LAD SVG to  PDA SVG to PL (RCA)   4.   Endoscopic vein harvest from the right leg       Discharge Exam: Blood pressure 135/71, pulse 70, temperature 97.9 F (36.6 C), temperature source Oral, resp. rate 20, height _0  (1.93 m), weight 126.7 kg, SpO2 94 %.  General appearance: alert, cooperative, and no distress Heart: regular rate and rhythm Lungs: clear to auscultation bilaterally Abdomen: soft, non-tender; bowel sounds normal; no masses,  no organomegaly Extremities: edema trace Wound: clean well healed   Discharge Medications:  The patient has been discharged on:   1.Beta Blocker:  Yes [  x ]                              No   [   ]                              If No, reason:  2.Ace Inhibitor/ARB: Yes [ x  ]                                     No  [    ]                                     If No, reason:  3.Statin:   Yes [ x  ]                  No  [   ]                  If No, reason:  4.Ecasa:  Yes  [  x ]  No   [   ]                  If No, reason:  Patient had ACS upon admission: Yes  Plavix/P2Y12 inhibitor: Yes [   ]                                      No  [  x] On eliquis     Discharge Instructions     Amb Referral to Cardiac Rehabilitation   Complete by: As directed    [place] regional referral   Diagnosis: CABG   CABG X ___: 3   After initial evaluation and assessments completed: Virtual Based Care may be provided alone or in conjunction with Phase 2 Cardiac Rehab based on patient barriers.: Yes   Intensive Cardiac Rehabilitation (ICR) [place] location only OR Traditional Cardiac Rehabilitation (TCR) *If criteria for ICR are not met will enroll in TCR [facility] only): Yes      Allergies as of 08/18/2022       Reactions   Hctz [hydrochlorothiazide] Shortness Of Breath   Enalapril Maleate    cramps   Tribenzor [olmesartan-amlodipine-hctz] Swelling   Fatigue, CP, edema   Verapamil    Fatigue        Medication List     TAKE these  medications    amiodarone 400 MG tablet Commonly known as: PACERONE Take 1 tablet (400 mg total) by mouth 2 (two) times daily. For 10 days the the dose will be reduced to 272m by mouth once  daily.   apixaban 5 MG Tabs tablet Commonly known as: ELIQUIS Take 1 tablet (5 mg total) by mouth 2 (two) times daily.   aspirin EC 81 MG tablet Take 1 tablet (81 mg total) by mouth daily. Swallow whole.   atorvastatin 80 MG tablet Commonly known as: LIPITOR Take 1 tablet (80 mg total) by mouth daily.   benazepril 20 MG tablet Commonly known as: LOTENSIN Take 1 tablet (20 mg total) by mouth at bedtime. What changed: See the new instructions.   cefdinir 300 MG capsule Commonly known as: OMNICEF Take 1 capsule (300 mg total) by mouth 2 (two) times daily.   diltiazem 180 MG 24 hr capsule Commonly known as: CARDIZEM CD Take 1 capsule (180 mg total) by mouth daily.   metoprolol tartrate 25 MG tablet Commonly known as: LOPRESSOR Take 1 tablet (25 mg total) by mouth 2 (two) times daily.   pantoprazole 40 MG tablet Commonly known as: PROTONIX TAKE ONE TABLET BY MOUTH EVERY EVENING   traMADol 50 MG tablet Commonly known as: ULTRAM Take 1 tablet (50 mg total) by mouth every 6 (six) hours as needed for up to 7 days for moderate pain.               Durable Medical Equipment  (From admission, onward)           Start     Ordered   08/18/22 1241  For home use only DME Bedside commode  Once       Question:  Patient needs a bedside commode to treat with the following condition  Answer:  Weakness   08/18/22 1240   08/18/22 1208  For home use only DME 4 wheeled rolling walker with seat  Once       Question:  Patient needs a walker to treat with the following condition  Answer:  Weakness   08/18/22 1209   08/16/22 1330  For home use only DME Walker rolling  Once       Question Answer Comment  Walker: With [place]   Patient needs a walker to treat with the following condition  Physical deconditioning   Patient needs a walker to treat with the following condition S/P CABG x 3      08/16/22 1331   08/11/22 2054  For home use only DME 3 n 1  Once        08/11/22 2053            Follow-up Information     [facility] Follow up on 09/05/2022.   Why: Appointment is at 9:30 for chest x ray Contact information: [place]        Gaye Pollack, MD Follow up on 09/05/2022.   Specialty: Cardiothoracic Surgery Why: Appointment is at 10:30 Contact information: [place] [address] [place], [place] Follow up on 08/22/2022.   Specialty: Cardiology Why: AFIB clinic appointment at 1:30PM Contact information: [place] [address] [phone]         Emmaline Life, NP Follow up on 08/23/2022.   Specialty: Nurse Practitioner Why: Cardiology appointment at 8:25AM Contact information: [place] [address] [place] [address] [place], [place] Follow up.   Specialty: [place] Why: Elliot Cousin) HHPT/OT arranged- they will contact you to schedule- anticipate initial first visit for Tuesday- Contact information: [place] [address] [phone]                 Signed:  Ellwood Handler, PA-C  08/18/2022, 1:15 PM

## 2022-08-17 NOTE — Progress Notes (Signed)
Mobility Specialist Progress Note:   08/17/22 1429  Mobility  Activity Ambulated with assistance in hallway  Level of Assistance Standby assist, set-up cues, supervision of patient - no hands on  Assistive Device Front wheel walker  Distance Ambulated (ft) 320 ft  Activity Response Tolerated well  $Mobility charge 1 Mobility   Pt received in bed asking to walk again. No complaints of pain. Left in chair with call bell in reach and all needs met.   Hector Edwards Mobility Specialist Secure Chat only   

## 2022-08-17 NOTE — TOC Progression Note (Signed)
Transition of Care [employer] [facility]) - Progression Note    Patient Details  Name: Hector Edwards MRN: [id number] Date of Birth: 1946/06/06  Transition of Care [facility]) CM/SW [place],  Phone Number: 08/17/2022, 8:34 AM  Clinical Narrative:     Insurance remains pending  TOC will continue to follow and assist with discharge planning.  Thurmond Butts, MSW, LCSW Clinical Social Worker    Expected Discharge Plan: [place] Barriers to Discharge: Barriers Resolved  Expected Discharge Plan and Services Expected Discharge Plan: [place] In-house Referral: NA Discharge Planning Services: CM Consult Post Acute Care Choice: [place] arrangements for the past 2 months: Single Family Home Expected Discharge Date: 08/12/22               DME Arranged: 3-N-1, Patient refused services DME Agency: NA       HH Arranged: PT, OT [place] Agency: [place] Date [place]: 08/12/22 Time [place]: [place] Representative spoke with at [place]: [place] Determinants of Health ([place]) Interventions    Readmission Risk Interventions    08/12/2022   10:16 AM  Readmission Risk Prevention Plan  Post Dischage Appt Complete  Medication Screening Complete  Transportation Screening Complete

## 2022-08-17 NOTE — Progress Notes (Addendum)
      [address]       [place] [address]             [phone]      9 Days Post-Op Procedure(s) (LRB): TRANSESOPHAGEAL ECHOCARDIOGRAM (TEE) (N/A) CARDIOVERSION (N/A) Subjective: Patient states he feels better today, ambulated 3 times yesterday.  Objective: Vital signs in last 24 hours: Temp:  [97.6 F (36.4 C)-99.1 F (37.3 C)] 99 F (37.2 C) (11/10 0500) Pulse Rate:  [72-130] 74 (11/10 0500) Cardiac Rhythm: Normal sinus rhythm;Bundle branch block;Heart block (11/09 1902) Resp:  [18-22] 20 (11/10 0500) BP: (110-128)/(51-82) 128/61 (11/10 0500) SpO2:  [90 %-94 %] 93 % (11/10 0500) Weight:  [131 kg] 131 kg (11/10 0500)  Hemodynamic parameters for last 24 hours:    Intake/Output from previous day: 11/09 0701 - 11/10 0700 In: 240 [P.O.:240] Out: 1850 [Urine:1850] Intake/Output this shift: No intake/output data recorded.  General appearance: alert, cooperative, and no distress Neurologic: intact Heart: regular rate and rhythm, S1, S2 normal, no murmur, click, rub or gallop Lungs: clear to auscultation bilaterally Abdomen: soft, non-tender; bowel sounds normal; no masses,  no organomegaly Extremities: extremities normal, atraumatic, no cyanosis or edema and expected bruising RLE, firm and minimally erythematous LUE at site of superficial phlebitis Wound: Clean, dry, intact  Lab Results: Recent Labs    08/15/22 0146 08/17/22 0106  WBC 14.9* 11.6*  HGB 10.1* 10.0*  HCT 30.0* 31.4*  PLT 429* 387   BMET:  Recent Labs    08/15/22 0146 08/17/22 0106  NA 133* 134*  K 3.8 4.0  CL 99 101  CO2 25 24  GLUCOSE 149* 168*  BUN 21 18  CREATININE 0.97 0.94  CALCIUM 9.0 9.0    PT/INR: No results for input(s): "LABPROT", "INR" in the last 72 hours. ABG    Component Value Date/Time   PHART 7.342 (L) 08/03/2022 2048   HCO3 19.6 (L) 08/03/2022 2048   TCO2 21 (L) 08/03/2022 2048   ACIDBASEDEF 5.0 (H) 08/03/2022 2048   O2SAT 97 08/03/2022 2048    CBG (last 3)  Recent Labs    08/16/22 0628  GLUCAP 117*    Assessment/Plan: S/P Procedure(s) (LRB): TRANSESOPHAGEAL ECHOCARDIOGRAM (TEE) (N/A) CARDIOVERSION (N/A)  CV: Hx of afib/flutter, POD 9 DCCV. In NSR, first degree heart block, rate 70's this AM. Started diltiazem '180mg'$  yesterday due to afib with RVR. On amiodarone, motoprolol and eliquis. Denies dizziness and weakness after restarting diltiazem at half dose. SBP 128, on benazepril.    Pulm: Saturating 93% on RA. Denies shortness of breath. Continue ambulation and IS.    GI: +BM. Good appetite.   Renal: On PO demadex '20mg'$  QD. Weight is stable, -11lbs from preop weight. Hyponatremia stable. K 4.0, continue supplementation. Cr stable 0.94. Will not require diuresis at discharge.    ID: Leokocytosis improved, 11.6   Expected postop ABLA: stable 10/31.4   Superficial Phlebitis: Likely due to amiodarone infusion on LUE. Erythema improved, firmness on exam. On cefdinir x 1 week. Continue warm compresses.   Deconditioning: Ambulated 3 times yesterday. Deconditioning continues to improve while on lower dose diltiazem. Continue work with PT/OT.   Dispo: SNF bed confirmed, awaiting insurance authorization. Continue to monitor heart rate and rhythm today. D/C next 24 hours.     LOS: 16 days    [name], PA-C 08/17/2022   Chart reviewed, patient examined, agree with above. Overall seems to be making progress now. Waiting on SNF bed approval.

## 2022-08-17 NOTE — Progress Notes (Signed)
Mobility Specialist Progress Note:   08/17/22 0940  Mobility  Activity Ambulated with assistance in hallway  Level of Assistance Standby assist, set-up cues, supervision of patient - no hands on  Assistive Device Front wheel walker  Distance Ambulated (ft) 300 ft  Activity Response Tolerated well  $Mobility charge 1 Mobility   Pt received in bed willing to participate in mobility. No complaints of pain. Left in chair with call bell in reach and all needs met.   [facility] Surveyor, mining Chat only

## 2022-08-17 NOTE — Plan of Care (Signed)
?  Problem: Clinical Measurements: ?Goal: Will remain free from infection ?Outcome: Progressing ?  ?

## 2022-08-18 DIAGNOSIS — R531 Weakness: Secondary | ICD-10-CM | POA: Diagnosis not present

## 2022-08-18 MED ORDER — METOPROLOL TARTRATE 25 MG PO TABS: 25.0000 mg | ORAL_TABLET | Freq: Two times a day (BID) | ORAL | 1 refills | Status: DC

## 2022-08-18 MED ORDER — CEFDINIR 300 MG PO CAPS: 300.0000 mg | ORAL_CAPSULE | Freq: Two times a day (BID) | ORAL | 0 refills | Status: DC

## 2022-08-18 MED ORDER — DILTIAZEM HCL ER COATED BEADS 180 MG PO CP24: 180.0000 mg | ORAL_CAPSULE | Freq: Every day | ORAL | 1 refills | Status: DC

## 2022-08-18 NOTE — Progress Notes (Signed)
Discharged education reviewed, DME has been delivered to room.  Pt transported home via significant other.

## 2022-08-18 NOTE — TOC Transition Note (Addendum)
Transition of Care [facility]) - CM/SW Discharge Note   Patient Details  Name: Hector Edwards MRN: [id number] Date of Birth: 1946-04-02  [facility] CM/SW Contact:  Konrad Penta, RN Phone Number: [phone] 08/18/2022, 1:16 PM   Clinical Narrative:   Spoke with patient and friend Bethena Roys at bedside. Confirmed [facility] will follow for PT/OT services post hospitalization. Discussed DME recommendations. Unable to order bariatric rollator due to weight requirements of 350 pds per [facility] with [employer]. Same for bedside commode. Discussed DME (rollator and 3in1) will be delivered to room prior to hospital discharge.   Confirmed with Ramond Marrow with [facility]) that [facility] will follow for home health services.   Please re-consult TOC if needed. No further TOC needs identified at this time.     Final next level of care: [place] Barriers to Discharge: No Barriers Identified   Patient Goals and CMS Choice Patient states their goals for this hospitalization and ongoing recovery are:: return home CMS Medicare.gov Compare Post Acute Care list provided to:: Patient Choice offered to / list presented to : Patient  Discharge Placement                       Discharge Plan and Services In-house Referral: NA Discharge Planning Services: CM Consult Post Acute Care Choice: Home Health          DME Arranged: 3-N-1, Walker rolling with seat DME Agency: [employer] Date DME Agency Contacted: 08/18/22 Time DME Agency Contacted: 1237 Representative spoke with at DME Agency: [place]: PT, OT [place] Agency: [place] Date [place]: 08/12/22 Time [place]: [place] Representative spoke with at [place]: [place] Determinants of Health ([place]) Interventions     Readmission Risk Interventions    08/12/2022   10:16 AM  Readmission Risk Prevention Plan  Post Dischage Appt Complete   Medication Screening Complete  Transportation Screening Complete

## 2022-08-18 NOTE — Progress Notes (Signed)
Physical Therapy Treatment Patient Details Name: Hector Edwards MRN: [id number] DOB: 06/25/1946 Today's Date: 08/18/2022   History of Present Illness Pt is 76 y.o. male admitted 08/01/22 post-LHC showing severe left main and 3 vessel CAD. S/p CABGx3 10/27. Post-op afib with RVR; s/p TEE with cardioversion 11/1. PMH includes HTN, HLD, LBP, OSA, GERD.   PT Comments    Pt progressing well with mobility. Today's session focused on transfer and gait training with rollator for added stability and energy conservation; pt prefers rollator to RW (CM notified). Pt hopeful for d/c home today. Pt's friend present and supportive, reviewed educ re: sternal precautions, activity recommendations, fall risk reduction, energy conservation strategies, DME and assist needs; pt's family/friends able to provide necessary support, already set up with HHPT services per pt.   HR 80s-100s    Recommendations for follow up therapy are one component of a multi-disciplinary discharge planning process, led by the attending physician.  Recommendations may be updated based on patient status, additional functional criteria and insurance authorization.  Follow Up Recommendations  Home health PT Can patient physically be transported by private vehicle: Yes   Assistance Recommended at Discharge Intermittent Supervision/Assistance  Patient can return home with the following A little help with bathing/dressing/bathroom;Assistance with cooking/housework;Assist for transportation;Help with stairs or ramp for entrance;Direct supervision/assist for medications management   Equipment Recommendations  Rollator (4 wheels) (bariatric - pt 6'4'')    Recommendations for Other Services       Precautions / Restrictions Precautions Precautions: Fall;Sternal     Mobility  Bed Mobility Overal bed mobility: Modified Independent Bed Mobility: Supine to Sit, Sit to Supine           General bed mobility comments: good ability  to maintain sternal precautions; pt reports HOB elevates on bed at home    Transfers Overall transfer level: Modified independent Equipment used: Rollator (4 wheels) Transfers: Sit to/from Stand             General transfer comment: educ on locking rollator brakes; pt with good carryover upon return to room when going to sit EOB without cues    Ambulation/Gait Ambulation/Gait assistance: Supervision Gait Distance (Feet): 300 Feet Assistive device: Rollator (4 wheels) Gait Pattern/deviations: Step-through pattern, Decreased stride length Gait velocity: Decreased     General Gait Details: steady ambulation with rollator and supervision for safety; no overt instability or LOB noted, pt reports improving fatigue and SOB. educ on setting up rollator if seated rest brake needed   Stairs Stairs:  (pt declined stair training; educ on safe technique, including rail use and step-to pattern; pt able to repeat this educ to friend upon return to room)           Wheelchair Mobility    Modified Rankin (Stroke Patients Only)       Balance Overall balance assessment: Needs assistance Sitting-balance support: No upper extremity supported, Feet supported Sitting balance-Leahy Scale: Good Sitting balance - Comments: indep to don slip-on shoes sitting EOB   Standing balance support: Bilateral upper extremity supported, No upper extremity supported, During functional activity Standing balance-Leahy Scale: Fair Standing balance comment: can static stand without UE support                            Cognition Arousal/Alertness: Awake/alert Behavior During Therapy: Flat affect Overall Cognitive Status: Within Functional Limits for tasks assessed  General Comments: pt with generally flat affect; following simple commands and answering questions appropriately; not some delay in responses, pt's friend reports HOH. personality  coming out more during session, pt optimistic for d/c home today. suspect baseline cognition        Exercises      General Comments General comments (skin integrity, edema, etc.): HR 80s-101. pt's friend present and supportive; educ both on sternal precautions, activity recommendations, energy conservation strategies (handout provided), assist levels at home (friend and sister plan to perform household tasks), DME needs (would like rollator more than RW), fall risk reduction. pt will have necessary support from family/friends      Pertinent Vitals/Pain Pain Assessment Pain Assessment: No/denies pain Pain Intervention(s): Monitored during session    Home Living                          Prior Function            PT Goals (current goals can now be found in the care plan section) Progress towards PT goals: Progressing toward goals    Frequency    Min 3X/week      PT Plan Equipment recommendations need to be updated;Discharge plan needs to be updated    Co-evaluation              AM-PAC PT "6 Clicks" Mobility   Outcome Measure  Help needed turning from your back to your side while in a flat bed without using bedrails?: None Help needed moving from lying on your back to sitting on the side of a flat bed without using bedrails?: None Help needed moving to and from a bed to a chair (including a wheelchair)?: None Help needed standing up from a chair using your arms (e.g., wheelchair or bedside chair)?: None Help needed to walk in hospital room?: A Little Help needed climbing 3-5 steps with a railing? : A Little 6 Click Score: 22    End of Session   Activity Tolerance: Patient tolerated treatment well Patient left: in bed;with call bell/phone within reach;with family/visitor present Nurse Communication: Mobility status PT Visit Diagnosis: Muscle weakness (generalized) (M62.81);Difficulty in walking, not elsewhere classified (R26.2);Unsteadiness on feet  (R26.81);Other abnormalities of gait and mobility (R26.89)     Time: 8099-8338 PT Time Calculation (min) (ACUTE ONLY): 27 min  Charges:  $Gait Training: 8-22 mins $Self Care/Home Management: [place], PT, DPT Acute Rehabilitation Services  Personal: [facility] Office: [place] 08/18/2022, 11:28 AM

## 2022-08-18 NOTE — Progress Notes (Addendum)
CARDIAC REHAB PHASE I   Pt received in bed with friend present in room after working with PT on mobility. Education on exercise guidelines, sternal precautions, IS use, restrictions, and nutrition reinforced with pt and friends. All questions answered, pt feels ready to go home.  [id number]  Colbert Ewing, MS 08/18/2022 11:37 AM

## 2022-08-18 NOTE — Progress Notes (Addendum)
      [address]       [place] [address]             [phone]       10 Days Post-Op Procedure(s) (LRB): TRANSESOPHAGEAL ECHOCARDIOGRAM (TEE) (N/A) CARDIOVERSION (N/A)  Subjective:  Patient sitting in bed.  He is no longer interested in SNF placement.  He has decided to go home with home health.  His sister is at bedside and they are ready to take him home, they're hoping for today.  Objective: Vital signs in last 24 hours: Temp:  [97.6 F (36.4 C)-98.3 F (36.8 C)] 97.6 F (36.4 C) (11/11 0900) Pulse Rate:  [70-79] 79 (11/11 0900) Cardiac Rhythm: Normal sinus rhythm;Heart block (11/11 0828) Resp:  [18-20] 20 (11/11 0900) BP: (128-153)/(59-72) 143/72 (11/11 0900) SpO2:  [92 %-99 %] 96 % (11/11 0900) Weight:  [126.7 kg] 126.7 kg (11/11 0626)  Intake/Output from previous day: 11/10 0701 - 11/11 0700 In: 480 [P.O.:480] Out: 1150 [Urine:1150]  General appearance: alert, cooperative, and no distress Heart: regular rate and rhythm Lungs: clear to auscultation bilaterally Abdomen: soft, non-tender; bowel sounds normal; no masses,  no organomegaly Extremities: edema trace Wound: clean well healed  Lab Results: Recent Labs    08/17/22 0106  WBC 11.6*  HGB 10.0*  HCT 31.4*  PLT 387   BMET:  Recent Labs    08/17/22 0106  NA 134*  K 4.0  CL 101  CO2 24  GLUCOSE 168*  BUN 18  CREATININE 0.94  CALCIUM 9.0    PT/INR: No results for input(s): "LABPROT", "INR" in the last 72 hours. ABG    Component Value Date/Time   PHART 7.342 (L) 08/03/2022 2048   HCO3 19.6 (L) 08/03/2022 2048   TCO2 21 (L) 08/03/2022 2048   ACIDBASEDEF 5.0 (H) 08/03/2022 2048   O2SAT 97 08/03/2022 2048   CBG (last 3)  Recent Labs    08/16/22 0628  GLUCAP 117*    Assessment/Plan: S/P Procedure(s) (LRB): TRANSESOPHAGEAL ECHOCARDIOGRAM (TEE) (N/A) CARDIOVERSION (N/A)  CV- H/O A. Fib/Flut, in NSR-continue Amiodarone, Lopressor, Cardizem, Eliquis, Benazepril for  BP Pulm- off oxygen no acute issues Renal- creatinine stable, continue Demadex LUE phlebitis- continue ABX x 1 week Deconditioning- patient declines SNF, home arrangements have been amde Dispo- patient stable, will okay d/c home today with Dr. Cyndia Bent   LOS: 17 days    Ellwood Handler, PA-C 08/18/2022   Chart reviewed, patient examined, agree with above. Plan home with his sister today.

## 2022-08-20 DIAGNOSIS — R69 Illness, unspecified: Secondary | ICD-10-CM | POA: Diagnosis not present

## 2022-08-20 DIAGNOSIS — R351 Nocturia: Secondary | ICD-10-CM | POA: Diagnosis not present

## 2022-08-20 DIAGNOSIS — I4892 Unspecified atrial flutter: Secondary | ICD-10-CM | POA: Diagnosis not present

## 2022-08-20 DIAGNOSIS — N401 Enlarged prostate with lower urinary tract symptoms: Secondary | ICD-10-CM | POA: Diagnosis not present

## 2022-08-20 DIAGNOSIS — Z6834 Body mass index (BMI) 34.0-34.9, adult: Secondary | ICD-10-CM | POA: Diagnosis not present

## 2022-08-20 DIAGNOSIS — Z7982 Long term (current) use of aspirin: Secondary | ICD-10-CM | POA: Diagnosis not present

## 2022-08-20 DIAGNOSIS — Z7901 Long term (current) use of anticoagulants: Secondary | ICD-10-CM | POA: Diagnosis not present

## 2022-08-20 DIAGNOSIS — I25110 Atherosclerotic heart disease of native coronary artery with unstable angina pectoris: Secondary | ICD-10-CM | POA: Diagnosis not present

## 2022-08-20 DIAGNOSIS — I350 Nonrheumatic aortic (valve) stenosis: Secondary | ICD-10-CM | POA: Diagnosis not present

## 2022-08-20 DIAGNOSIS — I119 Hypertensive heart disease without heart failure: Secondary | ICD-10-CM | POA: Diagnosis not present

## 2022-08-20 DIAGNOSIS — Z951 Presence of aortocoronary bypass graft: Secondary | ICD-10-CM | POA: Diagnosis not present

## 2022-08-20 DIAGNOSIS — I4819 Other persistent atrial fibrillation: Secondary | ICD-10-CM | POA: Diagnosis not present

## 2022-08-20 NOTE — Progress Notes (Unsigned)
Cardiology Office Note:    Date:  08/23/2022   ID:  Hector Edwards, DOB [birth year], MRN [id number]  PCP:  [name], [place] Prov[facility] Cardiologist:  Lauree Chandler, MD     Referring MD: [name],*   Chief Complaint: hospital follow-up post CABG  History of Present Illness:    Hector Edwards is a pleasant 76 y.o. male with a hx of CAD s/p CABG, obesity, HLD, HTN, GERD, OSA, mild AS, and depression  Referred to cardiology for dyspnea on exertion and seen by Dr. Angelena Form 06/13/2022.  Previously seen in 2017 by Dr. Irish Lack.  Echo July 2017 with normal LV function, no valve disease.  Nuclear stress test 2017 with no ischemia.  Echo September 2020 with normal LV function and no valvular disease.  At office visit 06/13/2022 he described progressive dyspnea and fatigue but no chest pain.  Rare LE edema.  Has sleep apnea but does not tolerate CPAP.  Echo 07/05/2022 with LVEF 60 to 65%, moderate LVH, mild aortic stenosis with mean gradient of 12 mmHg.  Coronary CTA 07/24/2022 revealed severe disease in the LAD and RCA that appears to be flow-limiting by CT FFR.  Seen in follow-up by Dr. Angelena Form on 07/27/2022. He continued to report dyspnea and fatigue.   He underwent left heart catheterization on 08/01/2022 which revealed severe distal left main/three-vessel CAD.  Severe, heavily calcified eccentric distal left main stenosis, severe ostial LAD stenosis, severe mid LAD stenosis, severe ostial circumflex stenosis, large dominant RCA with severe ostial stenosis.  Severe mid to distal vessel stenosis, severe stenosis in proximal posterior lateral artery, severe ostial PDA stenosis.  LVEDP 18 mmHg.  Recommendation to consult CT surgery for bypass. He underwent CABG x 3 (LIMA-LAD, SVG-PDA, SVG-PL (RCA), endoscopy vein harvest right leg on 08/03/22. Post-op course complicated by A Fib RVR  that persisted despite cardizem and digoxin. He underwent cardioversion  on 08/08/2022.  Follow-up CXR showed prominent central pulmonary vessels suggesting CHF and increased small to moderate bilateral pleural effusions which were treated with 1 dose of IV diuretic and he was restarted on daily oral diuretic.  He developed L UE superficial phlebitis around his peripheral IV site and was prescribed cefdinir x1 week.  He became diaphoretic and presyncopal during stair training with PT/OT.  Concern about living alone in a 3 story home patient ultimately refused SNF.  Repeat echo 11/6 was suboptimal due to obesity, rapid atrial fibrillation and recent surgery but showed LVEF 60 to 65%. He was discharged 08/18/22.   Today, he is here with his sister for follow-up. Reports he is feeling well with just mild chest soreness when coughing. His sister and a close friend are helping him at home and home health has been out once. Walking 10 minutes a few times per day without chest pain or shortness of breath. Is sleeping well, no orthopnea, edema, or PND. He denies presyncope, syncope, palpitations. Has not had felt a fib like he felt in the hospital. Seen in A Fib clinic on 11/15 by Adline Peals, PA and is continuing Eliquis and amiodarone as directed. No bleeding concerns. Reports some mild soreness right leg at site of vein harvest.   Past Medical History:  Diagnosis Date   Depression    GERD (gastroesophageal reflux disease)    Hx of adenomatous colonic polyps 09/04/2015   Hyperlipidemia    Hypertension    LBP (low back pain)    OSA (obstructive sleep apnea) 08/01/2018  Sleep apnea    wears cpap     Past Surgical History:  Procedure Laterality Date   CARDIOVERSION N/A 08/08/2022   Procedure: CARDIOVERSION;  Surgeon: Skeet Latch, MD;  Location: [facility] ENDOSCOPY;  Service: Cardiovascular;  Laterality: N/A;   COLONOSCOPY  2004, 2021   hyperplastic polyps x 2   CORONARY ARTERY BYPASS GRAFT N/A 08/03/2022   Procedure: CORONARY ARTERY BYPASS GRAFTING (CABG) 3X, USING RIGHT  GREATER SAPHENOUS VEIN AND LEFT INTERNAL MAMMARY ARTERY.;  Surgeon: Gaye Pollack, MD;  Location: [place];  Service: Open Heart Surgery;  Laterality: N/A;   KNEE ARTHROSCOPY Right    LEFT HEART CATH AND CORONARY ANGIOGRAPHY N/A 08/01/2022   Procedure: LEFT HEART CATH AND CORONARY ANGIOGRAPHY;  Surgeon: Burnell Blanks, MD;  Location: [facility];  Service: Cardiovascular;  Laterality: N/A;   PENILE PROSTHESIS IMPLANT N/A 01/25/2020   Procedure: PENILE PROTHESIS INFLATABLE COLOPLAST;  Surgeon: Lucas Mallow, MD;  Location: [place];  Service: Urology;  Laterality: N/A;   SHOULDER ARTHROSCOPY W/ ROTATOR CUFF REPAIR Left    TEE WITHOUT CARDIOVERSION N/A 08/03/2022   Procedure: TRANSESOPHAGEAL ECHOCARDIOGRAM (TEE);  Surgeon: Gaye Pollack, MD;  Location: [place];  Service: Open Heart Surgery;  Laterality: N/A;   TEE WITHOUT CARDIOVERSION N/A 08/08/2022   Procedure: TRANSESOPHAGEAL ECHOCARDIOGRAM (TEE);  Surgeon: Skeet Latch, MD;  Location: [facility] ENDOSCOPY;  Service: Cardiovascular;  Laterality: N/A;   TONSILLECTOMY  as child   vocal cord growth removed  age 59   benign   WISDOM TOOTH EXTRACTION      Current Medications: Current Meds  Medication Sig   [START ON 08/29/2022] amiodarone (PACERONE) 200 MG tablet Take 1 tablet (200 mg total) by mouth daily.   apixaban (ELIQUIS) 5 MG TABS tablet Take 1 tablet (5 mg total) by mouth 2 (two) times daily.   aspirin EC 81 MG tablet Take 1 tablet (81 mg total) by mouth daily. Swallow whole.   cefdinir (OMNICEF) 300 MG capsule Take 1 capsule (300 mg total) by mouth 2 (two) times daily.   pantoprazole (PROTONIX) 40 MG tablet TAKE ONE TABLET BY MOUTH EVERY EVENING   [DISCONTINUED] atorvastatin (LIPITOR) 80 MG tablet Take 1 tablet (80 mg total) by mouth daily.   [DISCONTINUED] benazepril (LOTENSIN) 20 MG tablet Take 1 tablet (20 mg total) by mouth at bedtime.   [DISCONTINUED] diltiazem (CARDIZEM CD) 180 MG 24 hr capsule  Take 1 capsule (180 mg total) by mouth daily.   [DISCONTINUED] metoprolol tartrate (LOPRESSOR) 25 MG tablet Take 1 tablet (25 mg total) by mouth 2 (two) times daily.     Allergies:   Hctz [hydrochlorothiazide], Enalapril maleate, Tribenzor [olmesartan-amlodipine-hctz], and Verapamil   Social History   Socioeconomic History   Marital status: Divorced    Spouse name: Not on file   Number of children: 1   Years of education: Not on file   Highest education level: Not on file  Occupational History   Occupation: Retired   Occupation: Retired-mailman  Tobacco Use   Smoking status: Never   Smokeless tobacco: Never   Tobacco comments:    Never smoke 08/22/22  Vaping Use   Vaping Use: Never used  Substance and Sexual Activity   Alcohol use: Not Currently    Alcohol/week: 4.0 standard drinks of alcohol    Types: 4 Cans of beer per week    Comment: stop drinking 08/22/22   Drug use: No   Sexual activity: Not Currently  Other Topics Concern  Not on file  Social History Narrative   Regular exercise- yes, seldom; working physically all the time.   Social Determinants of Health   Financial Resource Strain: Low Risk  (03/06/2022)   Overall Financial Resource Strain (CARDIA)    Difficulty of Paying Living Expenses: Not hard at all  Food Insecurity: No Food Insecurity (08/07/2022)   Hunger Vital Sign    Worried About Running Out of Food in the Last Year: Never true    Ran Out of Food in the Last Year: Never true  Transportation Needs: No Transportation Needs (08/07/2022)   PRAPARE - Hydrologist (Medical): No    Lack of Transportation (Non-Medical): No  Physical Activity: Inactive (03/06/2022)   Exercise Vital Sign    Days of Exercise per Week: 0 days    Minutes of Exercise per Session: 0 min  Stress: No Stress Concern Present (03/06/2022)   [place]    Feeling of Stress : Not at all   Social Connections: Moderately Integrated (03/06/2022)   Social Connection and Isolation Panel [NHANES]    Frequency of Communication with Friends and Family: More than three times a week    Frequency of Social Gatherings with Friends and Family: More than three times a week    Attends Religious Services: 1 to 4 times per year    Active Member of [employer] or Organizations: Yes    Attends Archivist Meetings: 1 to 4 times per year    Marital Status: Divorced     Family History: The patient's family history includes Breast cancer in his daughter; Colon polyps in his brother and father; Depression in his mother; Heart disease in his father; Hypertension in his father and another family member; Pancreatic cancer in his brother; Stroke (age of onset: 64) in his mother; Throat cancer in his brother. There is no history of Colon cancer, Esophageal cancer, Stomach cancer, or Rectal cancer.  ROS:   Please see the history of present illness.    + mild chest tenderness All other systems reviewed and are negative.  Labs/Other Studies Reviewed:    The following studies were reviewed today:  LHC 08/01/22    Mid RCA to Dist RCA lesion is 90% stenosed.   Ost RCA to Prox RCA lesion is 80% stenosed.   RPAV lesion is 99% stenosed.   RPDA lesion is 80% stenosed.   Mid LM to Dist LM lesion is 80% stenosed.   Ost Cx to Prox Cx lesion is 70% stenosed.   Dist LM to Ost LAD lesion is 80% stenosed.   Prox LAD to Mid LAD lesion is 60% stenosed.   Mid LAD lesion is 80% stenosed.   Severe distal left main/three vessel CAD Severe, heavily calcified eccentric distal left main stenosis Severe ostial LAD stenosis. Severe mid LAD stenosis Severe ostial Circumflex stenosis.  Large dominant RCA with severe ostial stenosis. Severe mid to distal vessel stenosis. Severe stenosis in the proximal posterolateral artery. Severe ostial PDA stenosis.  LVEDP 18 mmHg   Recommendations: Severe left main and three  vessel. Will admit to telemetry and consult CT surgery to review candidacy for bypass surgery. Will add statin, beta blocker and ASA.   Diagnostic Dominance: Right    VAS Pre CABG Carotid and Extremities 08/02/22  Summary:  Right Carotid: Velocities in the right ICA are consistent with a 1-39%  stenosis.   Left Carotid: Velocities in the left ICA are  consistent with a 1-39%  stenosis.  Vertebrals: Bilateral vertebral arteries demonstrate antegrade flow.  Subclavians: Normal flow hemodynamics were seen in the right subclavian  artery.              Elevated left subclavian artery velocities without evidence  of              focal stenosis.   Right ABI: Resting right ankle-brachial index is within normal range.  Left ABI: Resting left ankle-brachial index is within normal range.   Right Upper Extremity: Doppler waveforms remain within normal limits with  right radial compression. Doppler waveforms remain within normal limits  with right ulnar compression.  Left Upper Extremity: Doppler waveforms remain within normal limits with  left radial compression. Doppler waveforms remain within normal limits  with left ulnar compression.      Echo 08/13/22  1. Technically difficult study. Left ventricular ejection fraction, by  estimation, is 60 to 65%. The left ventricle has normal function. The left  ventricle has no regional wall motion abnormalities. There is moderate  left ventricular hypertrophy. Left  ventricular diastolic parameters are indeterminate.   2. Right ventricular systolic function was not well visualized. The right  ventricular size is not well visualized.   3. A small pericardial effusion is present.   4. The mitral valve is normal in structure. No evidence of mitral valve  regurgitation.   5. The aortic valve was not well visualized. Aortic valve regurgitation  is not visualized.  Recent Labs: 06/05/2022: ALT 35; TSH 2.37 08/07/2022: Magnesium 2.2 08/17/2022:  BUN 18; Creatinine, Ser 0.94; Hemoglobin 10.0; Platelets 387; Potassium 4.0; Sodium 134  Recent Lipid Panel    Component Value Date/Time   CHOL 88 08/07/2022 0115   CHOL 170 06/16/2019 1035   TRIG 107 08/07/2022 0115   HDL 16 (L) 08/07/2022 0115   HDL 32 (L) 06/16/2019 1035   CHOLHDL 5.5 08/07/2022 0115   VLDL 21 08/07/2022 0115   LDLCALC 51 08/07/2022 0115   LDLCALC 121 (H) 06/16/2019 1035   LDLDIRECT 158.5 10/19/2008 0858     Risk Assessment/Calculations:    CHA2DS2-VASc Score = 3   This indicates a 3.2% annual risk of stroke. The patient's score is based upon: CHF History: 0 HTN History: 1 Diabetes History: 0 Stroke History: 0 Vascular Disease History: 1 Age Score: 1 Gender Score: 0    Physical Exam:    VS:  BP 112/68   Pulse 80   Ht '6\' 4"'$  (1.93 m)   Wt 283 lb (128.4 kg)   SpO2 99%   BMI 34.45 kg/m     Wt Readings from Last 3 Encounters:  08/23/22 283 lb (128.4 kg)  08/22/22 283 lb 3.2 oz (128.5 kg)  08/18/22 279 lb 4.8 oz (126.7 kg)     GEN:  Well nourished, well developed in no acute distress HEENT: Normal NECK: No JVD; No carotid bruits CARDIAC: RRR, no murmurs, rubs, gallops RESPIRATORY:  Diminished breath sounds LLL. Otherwise, clear to auscultation without rales, wheezing or rhonchi  ABDOMEN: Sternotomy with edges well-approximated, no erythema, no drainage. Soft, non-tender, non-distended MUSCULOSKELETAL:  No edema; No deformity. 2+ pedal pulses, equal bilaterally SKIN: Warm and dry NEUROLOGIC:  Alert and oriented x 3 PSYCHIATRIC:  Normal affect   EKG:  EKG is  ordered today.  The ekg ordered today demonstrates NSR at 80 bpm, nonspecific intraventricular block, nonspecific T wave abnormality   Diagnoses:    1. Coronary artery disease involving native coronary artery of  native heart without angina pectoris   2. Essential hypertension   3. Hyperlipidemia LDL goal <70   4. S/P CABG x 3   5. Pleural effusion on left   6. OSA (obstructive sleep  apnea)    Assessment and Plan:     CAD s/p CABG x 3 without angina: He is feeling well. Mild chest tenderness with coughing. Resuming walking up to 10 minutes several times per day. Advised continued sternal precautions and to gradually increase walking as tolerated. Does not want to participate in CR. He denies chest pain, shortness of breath, palpitations. Continue asa, metoprolol, atorvastatin.   Persistent atrial fibrillation on chronic anticoagulation: Maintaining sinus rhythm today. HR consistently in the 70s bpm at home. Seen in A Fib clinic on 11/15. Continue Eliquis and amiodarone. Understands instructions to decrease amiodarone dose after 10 days of therapy. Has follow-up scheduled with A Fib clinic in February. No bleeding concerns. Continue Eliquis, amiodarone, diltiazem, metoprolol.   Hyperlipidemia LDL goal < 70: LDL 51 on 08/07/22. Continue atorvastatin.   Hypertension: BP well-controlled today. Home BP well-controlled as well, consistently < 130 mmHg.   Pleural effusion: Small left pleural effusion on CXR 08/14/22. He continues to have diminished breath sounds in left lung base. Encouraged continued incentive spirometry. No dyspnea, orthopnea, or PND. Has follow-up CXR scheduled in 2 weeks with Dr. Cyndia Bent.   OSA: Having problems with CPAP equipment. Recommended that he contact Dr. Halford Chessman for follow-up.      Disposition: 2-3 months with Dr. Angelena Form or APP  Medication Adjustments/Labs and Tests Ordered: Current medicines are reviewed at length with the patient today.  Concerns regarding medicines are outlined above.  Orders Placed This Encounter  Procedures   EKG 12-Lead   Meds ordered this encounter  Medications   atorvastatin (LIPITOR) 80 MG tablet    Sig: Take 1 tablet (80 mg total) by mouth daily.    Dispense:  90 tablet    Refill:  3   benazepril (LOTENSIN) 20 MG tablet    Sig: Take 1 tablet (20 mg total) by mouth at bedtime.    Dispense:  90 tablet    Refill:  3    diltiazem (CARDIZEM CD) 180 MG 24 hr capsule    Sig: Take 1 capsule (180 mg total) by mouth daily.    Dispense:  90 capsule    Refill:  3   metoprolol tartrate (LOPRESSOR) 25 MG tablet    Sig: Take 1 tablet (25 mg total) by mouth 2 (two) times daily.    Dispense:  180 tablet    Refill:  3    Patient Instructions  Medication Instructions:   Your physician recommends that you continue on your current medications as directed. Please refer to the Current Medication list given to you today.   *If you need a refill on your cardiac medications before your next appointment, please call your pharmacy*   Lab Work:  None ordered.  If you have labs (blood work) drawn today and your tests are completely normal, you will receive your results only by: [place] (if you have MyChart) OR A paper copy in the mail If you have any lab test that is abnormal or we need to change your treatment, we will call you to review the results.   Testing/Procedures:  None ordered.   Follow-Up: At [facility], you and your health needs are our priority.  As part of our continuing mission to provide you with exceptional heart care, we have  created designated Provider Care Teams.  These Care Teams include your primary Cardiologist (physician) and Advanced Practice Providers (APPs -  Physician Assistants and Nurse Practitioners) who all work together to provide you with the care you need, when you need it.  We recommend signing up for the patient portal called "MyChart".  Sign up information is provided on this After Visit Summary.  MyChart is used to connect with patients for Virtual Visits (Telemedicine).  Patients are able to view lab/test results, encounter notes, upcoming appointments, etc.  Non-urgent messages can be sent to your provider as well.   To learn more about what you can do with MyChart, go to [email or web address].    Your next appointment:   2 month(s)  The format for  your next appointment:   In Person  Provider:        Then, Lauree Chandler, MD will plan to see you again in 3 month(s).    Important Information About Sugar         Signed, Emmaline Life, NP  08/23/2022 10:47 AM    [place]

## 2022-08-21 ENCOUNTER — Telehealth: Payer: Self-pay | Admitting: Family

## 2022-08-21 NOTE — Telephone Encounter (Signed)
Caller/Agency: [facility]) Callback Number: [phone] Requesting OT/PT/Skilled Nursing/Social Work/Speech Therapy: PT Frequency: 1 w 4

## 2022-08-21 NOTE — Telephone Encounter (Signed)
I have called Hector Edwards with [facility] to inform him that we are not able to approve the verbal since we were not aware of it. Cardiology has ordered the PT and OT and skilled and he needs to reach out to them to get the order done. He restated what I said and then he hung up. I told him to have a great day before he officially hung up the phone.

## 2022-08-22 ENCOUNTER — Encounter (HOSPITAL_COMMUNITY): Payer: Self-pay | Admitting: Physician Assistant

## 2022-08-22 ENCOUNTER — Ambulatory Visit (HOSPITAL_COMMUNITY)
Admission: RE | Admit: 2022-08-22 | Discharge: 2022-08-22 | Disposition: A | Discharge status: Home / Self Care | Payer: Medicare HMO | Source: Ambulatory Visit | Attending: Physician Assistant | Admitting: Physician Assistant

## 2022-08-22 VITALS — BP 118/72 | HR 71 | Ht 76.0 in | Wt 283.2 lb

## 2022-08-22 DIAGNOSIS — G4733 Obstructive sleep apnea (adult) (pediatric): Secondary | ICD-10-CM | POA: Insufficient documentation

## 2022-08-22 DIAGNOSIS — E669 Obesity, unspecified: Secondary | ICD-10-CM | POA: Diagnosis not present

## 2022-08-22 DIAGNOSIS — Z6834 Body mass index (BMI) 34.0-34.9, adult: Secondary | ICD-10-CM | POA: Insufficient documentation

## 2022-08-22 DIAGNOSIS — D6869 Other thrombophilia: Secondary | ICD-10-CM | POA: Diagnosis not present

## 2022-08-22 DIAGNOSIS — I10 Essential (primary) hypertension: Secondary | ICD-10-CM | POA: Diagnosis not present

## 2022-08-22 DIAGNOSIS — I4819 Other persistent atrial fibrillation: Secondary | ICD-10-CM

## 2022-08-22 DIAGNOSIS — Z7901 Long term (current) use of anticoagulants: Secondary | ICD-10-CM | POA: Diagnosis not present

## 2022-08-22 DIAGNOSIS — I4892 Unspecified atrial flutter: Secondary | ICD-10-CM | POA: Diagnosis not present

## 2022-08-22 DIAGNOSIS — I2510 Atherosclerotic heart disease of native coronary artery without angina pectoris: Secondary | ICD-10-CM | POA: Insufficient documentation

## 2022-08-22 MED ORDER — AMIODARONE HCL 200 MG PO TABS: 200.0000 mg | ORAL_TABLET | Freq: Every day | ORAL | 3 refills | Status: DC

## 2022-08-22 NOTE — Progress Notes (Signed)
Primary Care Physician: [name], [place] Primary Cardiologist: Dr Angelena Form  Primary Electrophysiologist: Dr Curt Bears  Referring Physician: Dr Marlane Mingle is a 76 y.o. male with a history of HTN, HLD, OSA, CAD, atrial fibrillation who presents for follow up in the [facility].  The patient was initially diagnosed with atrial fibrillation postoperatively following CABG. Coronary CTA 07/24/22 with severe disease in the LAD and RCA that appears to be flow limiting by CT FFR. Cardiac cath 08/01/22 with severe distal left main stenosis, severe disease in the ostial LAD and mid LAD, ostial Circumflex and in the proximal/distal RCA, PLA and PDA. He underwent CABG on 08/03/22. EP consulted 08/07/22 for rapid atrial fibrillation and atrial flutter, patient loaded on amiodarone and underwent TEE/DCCV on 08/08/22. He did have some paroxysms of afib after but was maintaining SR as he loaded on amiodarone. Patient is on Eliquis for a CHADS2VASC score of 3.  On follow up today, patient reports that he has done reasonably well. He continues to recover from his surgery. He has not had any symptoms of atrial fibrillation since discharge and is in SR today. No bleeding issues on anticoagulation.   Today, he denies symptoms of palpitations, chest pain, shortness of breath, orthopnea, PND, lower extremity edema, dizziness, presyncope, syncope, bleeding, or neurologic sequela. The patient is tolerating medications without difficulties and is otherwise without complaint today.    Atrial Fibrillation Risk Factors:  he does have symptoms or diagnosis of sleep apnea. he is not compliant with CPAP therapy. he does not have a history of rheumatic fever.   he has a BMI of Body mass index is 34.47 kg/m.Marland Kitchen Filed Weights   08/22/22 1316  Weight: 128.5 kg    Family History  Problem Relation Age of Onset   Stroke Mother 60   Depression Mother    Hypertension  Father    Heart disease Father    Colon polyps Father    Pancreatic cancer Brother    Colon polyps Brother    Throat cancer Brother    Breast cancer Daughter    Hypertension Other    Colon cancer Neg Hx    Esophageal cancer Neg Hx    Stomach cancer Neg Hx    Rectal cancer Neg Hx      Atrial Fibrillation Management history:  Previous antiarrhythmic drugs: amiodarone  Previous cardioversions: 08/08/22 Previous ablations: none CHADS2VASC score: 3 Anticoagulation history: Eliquis   Past Medical History:  Diagnosis Date   Depression    GERD (gastroesophageal reflux disease)    Hx of adenomatous colonic polyps 09/04/2015   Hyperlipidemia    Hypertension    LBP (low back pain)    OSA (obstructive sleep apnea) 08/01/2018   Sleep apnea    wears cpap    Past Surgical History:  Procedure Laterality Date   CARDIOVERSION N/A 08/08/2022   Procedure: CARDIOVERSION;  Surgeon: Skeet Latch, MD;  Location: [place];  Service: Cardiovascular;  Laterality: N/A;   COLONOSCOPY  2004, 2021   hyperplastic polyps x 2   CORONARY ARTERY BYPASS GRAFT N/A 08/03/2022   Procedure: CORONARY ARTERY BYPASS GRAFTING (CABG) 3X, USING RIGHT GREATER SAPHENOUS VEIN AND LEFT INTERNAL MAMMARY ARTERY.;  Surgeon: Gaye Pollack, MD;  [facility];  Service: Open Heart Surgery;  Laterality: N/A;   KNEE ARTHROSCOPY Right    LEFT HEART CATH AND CORONARY ANGIOGRAPHY N/A 08/01/2022   Procedure: LEFT HEART CATH AND CORONARY ANGIOGRAPHY;  Surgeon:  Burnell Blanks, MD;  Location: [place] [facility];  Service: Cardiovascular;  Laterality: N/A;   PENILE PROSTHESIS IMPLANT N/A 01/25/2020   Procedure: PENILE PROTHESIS INFLATABLE COLOPLAST;  Surgeon: Lucas Mallow, MD;  Location: [place];  Service: Urology;  Laterality: N/A;   SHOULDER ARTHROSCOPY W/ ROTATOR CUFF REPAIR Left    TEE WITHOUT CARDIOVERSION N/A 08/03/2022   Procedure: TRANSESOPHAGEAL ECHOCARDIOGRAM (TEE);  Surgeon:  Gaye Pollack, MD;  Location: [place];  Service: Open Heart Surgery;  Laterality: N/A;   TEE WITHOUT CARDIOVERSION N/A 08/08/2022   Procedure: TRANSESOPHAGEAL ECHOCARDIOGRAM (TEE);  Surgeon: Skeet Latch, MD;  Location: [facility];  Service: Cardiovascular;  Laterality: N/A;   TONSILLECTOMY  as child   vocal cord growth removed  age 56   benign   WISDOM TOOTH EXTRACTION      Current Outpatient Medications  Medication Sig Dispense Refill   apixaban (ELIQUIS) 5 MG TABS tablet Take 1 tablet (5 mg total) by mouth 2 (two) times daily. 60 tablet 3   aspirin EC 81 MG tablet Take 1 tablet (81 mg total) by mouth daily. Swallow whole. 30 tablet 12   atorvastatin (LIPITOR) 80 MG tablet Take 1 tablet (80 mg total) by mouth daily. 30 tablet 3   benazepril (LOTENSIN) 20 MG tablet Take 1 tablet (20 mg total) by mouth at bedtime. 30 tablet 2   cefdinir (OMNICEF) 300 MG capsule Take 1 capsule (300 mg total) by mouth 2 (two) times daily. 14 capsule 0   diltiazem (CARDIZEM CD) 180 MG 24 hr capsule Take 1 capsule (180 mg total) by mouth daily. 30 capsule 1   metoprolol tartrate (LOPRESSOR) 25 MG tablet Take 1 tablet (25 mg total) by mouth 2 (two) times daily. 30 tablet 1   pantoprazole (PROTONIX) 40 MG tablet TAKE ONE TABLET BY MOUTH EVERY EVENING 30 tablet 0   [START ON 08/29/2022] amiodarone (PACERONE) 200 MG tablet Take 1 tablet (200 mg total) by mouth daily. 30 tablet 3   No current facility-administered medications for this encounter.    Allergies  Allergen Reactions   Hctz [Hydrochlorothiazide] Shortness Of Breath   Enalapril Maleate     cramps   Tribenzor [Olmesartan-Amlodipine-Hctz] Swelling    Fatigue, CP, edema   Verapamil     Fatigue    Social History   Socioeconomic History   Marital status: Divorced    Spouse name: Not on file   Number of children: 1   Years of education: Not on file   Highest education level: Not on file  Occupational History   Occupation: Retired    Occupation: Retired-mailman  Tobacco Use   Smoking status: Never   Smokeless tobacco: Never   Tobacco comments:    Never smoke 08/22/22  Vaping Use   Vaping Use: Never used  Substance and Sexual Activity   Alcohol use: Not Currently    Alcohol/week: 4.0 standard drinks of alcohol    Types: 4 Cans of beer per week    Comment: stop drinking 08/22/22   Drug use: No   Sexual activity: Not Currently  Other Topics Concern   Not on file  Social History Narrative   Regular exercise- yes, seldom; working physically all the time.   Social Determinants of Health   Financial Resource Strain: Low Risk  (03/06/2022)   Overall Financial Resource Strain (CARDIA)    Difficulty of Paying Living Expenses: Not hard at all  Food Insecurity: No Food Insecurity (08/07/2022)   Hunger Vital  Sign    Worried About Charity fundraiser in the Last Year: Never true    [place] in the Last Year: Never true  Transportation Needs: No Transportation Needs (08/07/2022)   PRAPARE - Hydrologist (Medical): No    Lack of Transportation (Non-Medical): No  Physical Activity: Inactive (03/06/2022)   Exercise Vital Sign    Days of Exercise per Week: 0 days    Minutes of Exercise per Session: 0 min  Stress: No Stress Concern Present (03/06/2022)   [place]    Feeling of Stress : Not at all  Social Connections: Moderately Integrated (03/06/2022)   Social Connection and Isolation Panel [NHANES]    Frequency of Communication with Friends and Family: More than three times a week    Frequency of Social Gatherings with Friends and Family: More than three times a week    Attends Religious Services: 1 to 4 times per year    Active Member of [employer] or Organizations: Yes    Attends Archivist Meetings: 1 to 4 times per year    Marital Status: Divorced  Human resources officer Violence: Not At Risk (08/07/2022)    Humiliation, Afraid, Rape, and Kick questionnaire    Fear of Current or Ex-Partner: No    Emotionally Abused: No    Physically Abused: No    Sexually Abused: No     ROS- All systems are reviewed and negative except as per the HPI above.  Physical Exam: Vitals:   08/22/22 1316  BP: 118/72  Pulse: 71  Weight: 128.5 kg  Height: '6\' 4"'$  (1.93 m)    GEN- The patient is a well appearing obese male, alert and oriented x 3 today.   Head- normocephalic, atraumatic Eyes-  Sclera clear, conjunctiva pink Ears- hearing intact Oropharynx- clear Neck- supple  Lungs- Clear to ausculation bilaterally, normal work of breathing Heart- Regular rate and rhythm, no murmurs, rubs or gallops  GI- soft, NT, ND, + BS Extremities- no clubbing, cyanosis, or edema MS- no significant deformity or atrophy Skin- no rash or lesion Psych- euthymic mood, full affect Neuro- strength and sensation are intact  Wt Readings from Last 3 Encounters:  08/22/22 128.5 kg  08/18/22 126.7 kg  07/27/22 (!) 138.2 kg    EKG today demonstrates  SR, 1st degree AV block, LBBB Vent. rate 71 BPM PR interval 238 ms QRS duration 124 ms QT/QTcB 434/471 ms P-R-T axes 28 9 138  Echo 08/13/22 demonstrated   1. Technically difficult study. Left ventricular ejection fraction, by  estimation, is 60 to 65%. The left ventricle has normal function. The left  ventricle has no regional wall motion abnormalities. There is moderate  left ventricular hypertrophy. Left ventricular diastolic parameters are indeterminate.   2. Right ventricular systolic function was not well visualized. The right  ventricular size is not well visualized.   3. A small pericardial effusion is present.   4. The mitral valve is normal in structure. No evidence of mitral valve  regurgitation.   5. The aortic valve was not well visualized. Aortic valve regurgitation  is not visualized.   Epic records are reviewed at length today  CHA2DS2-VASc Score = 3   The patient's score is based upon: CHF History: 0 HTN History: 1 Diabetes History: 0 Stroke History: 0 Vascular Disease History: 1 Age Score: 1 Gender Score: 0       ASSESSMENT AND PLAN: 1.  Persistent Atrial Fibrillation (ICD10:  I48.19) The patient's CHA2DS2-VASc score is 3, indicating a 3.2% annual risk of stroke.   Diagnosed postoperatively  S/p TEE/DCCV 08/08/22 Patient in [place] today.  Continue amiodarone 400 mg BID for now, will decrease to 200 mg daily after 10 days since discharge.  Continue Eliquis 5 mg BID Will consider discontinuing medication in the future although admittedly he does have risk factors for atrial fibrillation. May benefit from cardiac monitor before discontinuing anticoagulation.   2. Secondary Hypercoagulable State (ICD10:  D68.69) The patient is at significant risk for stroke/thromboembolism based upon his CHA2DS2-VASc Score of 3.  Continue Apixaban (Eliquis).   3. Obesity Body mass index is 34.47 kg/m. Lifestyle modification was discussed at length including regular exercise and weight reduction.  4. Obstructive sleep apnea The importance of adequate treatment of sleep apnea was discussed today in order to improve our ability to maintain sinus rhythm long term. He is intolerant of his CPAP mask. Followed by Dr Halford Chessman previously. Patient plans to reach out to discuss treatment options.  5. CAD S/p CABG No anginal symptoms, still recovering from surgery.  6. HTN Stable, no changes today.   Follow up with Christen Bame as scheduled. AF clinic in 3 months.    [facility] [address] [place], [place] [address] [phone] 08/22/2022 1:52 PM

## 2022-08-22 NOTE — Patient Instructions (Signed)
On 11/22 Amiodarone to '200mg'$  ONCE day

## 2022-08-23 ENCOUNTER — Encounter: Payer: Self-pay | Admitting: Nurse Practitioner

## 2022-08-23 ENCOUNTER — Ambulatory Visit: Payer: Medicare HMO | Attending: Nurse Practitioner | Admitting: Nurse Practitioner

## 2022-08-23 VITALS — BP 112/68 | HR 80 | Ht 76.0 in | Wt 283.0 lb

## 2022-08-23 DIAGNOSIS — I10 Essential (primary) hypertension: Secondary | ICD-10-CM

## 2022-08-23 DIAGNOSIS — I2510 Atherosclerotic heart disease of native coronary artery without angina pectoris: Secondary | ICD-10-CM | POA: Diagnosis not present

## 2022-08-23 DIAGNOSIS — Z951 Presence of aortocoronary bypass graft: Secondary | ICD-10-CM | POA: Diagnosis not present

## 2022-08-23 DIAGNOSIS — J90 Pleural effusion, not elsewhere classified: Secondary | ICD-10-CM

## 2022-08-23 DIAGNOSIS — G4733 Obstructive sleep apnea (adult) (pediatric): Secondary | ICD-10-CM | POA: Diagnosis not present

## 2022-08-23 DIAGNOSIS — E785 Hyperlipidemia, unspecified: Secondary | ICD-10-CM

## 2022-08-23 MED ORDER — BENAZEPRIL HCL 20 MG PO TABS: 20.0000 mg | ORAL_TABLET | Freq: Every day | ORAL | 3 refills | Status: DC

## 2022-08-23 MED ORDER — DILTIAZEM HCL ER COATED BEADS 180 MG PO CP24: 180.0000 mg | ORAL_CAPSULE | Freq: Every day | ORAL | 3 refills | Status: DC

## 2022-08-23 MED ORDER — METOPROLOL TARTRATE 25 MG PO TABS: 25.0000 mg | ORAL_TABLET | Freq: Two times a day (BID) | ORAL | 3 refills | Status: DC

## 2022-08-23 MED ORDER — ATORVASTATIN CALCIUM 80 MG PO TABS: 80.0000 mg | ORAL_TABLET | Freq: Every day | ORAL | 3 refills | Status: DC

## 2022-08-23 NOTE — Patient Instructions (Addendum)
Medication Instructions:   Your physician recommends that you continue on your current medications as directed. Please refer to the Current Medication list given to you today.   *If you need a refill on your cardiac medications before your next appointment, please call your pharmacy*   Lab Work:  None ordered.  If you have labs (blood work) drawn today and your tests are completely normal, you will receive your results only by: [place] (if you have MyChart) OR A paper copy in the mail If you have any lab test that is abnormal or we need to change your treatment, we will call you to review the results.   Testing/Procedures:  None ordered.   Follow-Up: At [facility], you and your health needs are our priority.  As part of our continuing mission to provide you with exceptional heart care, we have created designated Provider Care Teams.  These Care Teams include your primary Cardiologist (physician) and Advanced Practice Providers (APPs -  Physician Assistants and Nurse Practitioners) who all work together to provide you with the care you need, when you need it.  We recommend signing up for the patient portal called "MyChart".  Sign up information is provided on this After Visit Summary.  MyChart is used to connect with patients for Virtual Visits (Telemedicine).  Patients are able to view lab/test results, encounter notes, upcoming appointments, etc.  Non-urgent messages can be sent to your provider as well.   To learn more about what you can do with MyChart, go to [email or web address].    Your next appointment:   2 month(s)  The format for your next appointment:   In Person  Provider:        Then, Lauree Chandler, MD will plan to see you again in 3 month(s).    Important Information About Sugar

## 2022-08-24 ENCOUNTER — Other Ambulatory Visit: Payer: Self-pay | Admitting: Physician Assistant

## 2022-08-24 DIAGNOSIS — Z7982 Long term (current) use of aspirin: Secondary | ICD-10-CM | POA: Diagnosis not present

## 2022-08-24 DIAGNOSIS — I25110 Atherosclerotic heart disease of native coronary artery with unstable angina pectoris: Secondary | ICD-10-CM | POA: Diagnosis not present

## 2022-08-24 DIAGNOSIS — I119 Hypertensive heart disease without heart failure: Secondary | ICD-10-CM | POA: Diagnosis not present

## 2022-08-24 DIAGNOSIS — Z7901 Long term (current) use of anticoagulants: Secondary | ICD-10-CM | POA: Diagnosis not present

## 2022-08-24 DIAGNOSIS — Z951 Presence of aortocoronary bypass graft: Secondary | ICD-10-CM | POA: Diagnosis not present

## 2022-08-24 DIAGNOSIS — I350 Nonrheumatic aortic (valve) stenosis: Secondary | ICD-10-CM | POA: Diagnosis not present

## 2022-08-24 DIAGNOSIS — I4819 Other persistent atrial fibrillation: Secondary | ICD-10-CM | POA: Diagnosis not present

## 2022-08-24 DIAGNOSIS — R69 Illness, unspecified: Secondary | ICD-10-CM | POA: Diagnosis not present

## 2022-08-24 DIAGNOSIS — R351 Nocturia: Secondary | ICD-10-CM | POA: Diagnosis not present

## 2022-08-24 DIAGNOSIS — I4892 Unspecified atrial flutter: Secondary | ICD-10-CM | POA: Diagnosis not present

## 2022-08-24 DIAGNOSIS — Z6834 Body mass index (BMI) 34.0-34.9, adult: Secondary | ICD-10-CM | POA: Diagnosis not present

## 2022-08-24 DIAGNOSIS — N401 Enlarged prostate with lower urinary tract symptoms: Secondary | ICD-10-CM | POA: Diagnosis not present

## 2022-08-27 ENCOUNTER — Telehealth: Payer: Self-pay

## 2022-08-27 NOTE — Telephone Encounter (Signed)
Appt w/ PCP tomorrow.

## 2022-08-27 NOTE — Telephone Encounter (Signed)
Nurse Assessment Nurse: Claiborne Billings, RN, Kim Date/Time (Eastern Time): 08/27/2022 8:21:11 AM Confirm and document reason for call. If symptomatic, describe symptoms. ---Torrie Mayers states pt has had a lot of pressure when urinating along with frequency and decreased output. Does the patient have any new or worsening symptoms? ---Yes Will a triage be completed? ---Yes Related visit to physician within the last 2 weeks? ---Yes Does the PT have any chronic conditions? (i.e. diabetes, asthma, this includes High risk factors for pregnancy, etc.) ---Yes List chronic conditions. ---Recent heart bypass surgery Is this a behavioral health or substance abuse call? ---No Guidelines Guideline Title Affirmed Question Affirmed Notes Nurse Date/Time Eilene Ghazi Time) Urinary Symptoms Urinating more frequently than usual (i.e., frequency) Claiborne Billings, RN, Kim 08/27/2022 8:22:53 AM Disp. Time Eilene Ghazi Time) Disposition Final User 08/27/2022 8:09:23 AM Attempt made - message left Suzette Battiest 08/27/2022 8:24:22 AM See PCP within 24 Hours Yes Claiborne Billings, RN, Maudie Mercury PLEASE NOTE: All timestamps contained within this report are represented as [place] Standard Time. CONFIDENTIALTY NOTICE: This fax transmission is intended only for the addressee. It contains information that is legally privileged, confidential or otherwise protected from use or disclosure. If you are not the intended recipient, you are strictly prohibited from reviewing, disclosing, copying using or disseminating any of this information or taking any action in reliance on or regarding this information. If you have received this fax in error, please notify us immediately by telephone so that we can arrange for its return to [place]. Phone: [phone], Toll-Free: [phone], Fax: [phone] Page: 2 of 2 Call Id: [id number] Final Disposition 08/27/2022 8:24:22 AM See PCP within 24 Hours Yes Claiborne Billings, RN, Max Sane Disagree/Comply Comply Caller Understands  Yes PreDisposition Did not know what to do Care Advice Given Per Guideline SEE PCP WITHIN 24 HOURS: * IF OFFICE WILL BE OPEN: You need to be examined within the next 24 hours. Call your doctor (or NP/PA) when the office opens and make an appointment. DRINK PLENTY OF LIQUIDS: * Drink plenty of liquids. It is important to stay well-hydrated. CALL BACK IF: * Fever occurs * Unable to urinate and bladder feels full * You become worse CARE ADVICE given per Urinary Symptoms (Adult) guideline. Comments User: Suezanne Jacquet, RN Date/Time Eilene Ghazi Time): 08/27/2022 8:08:17 AM Caller states she is not currently with pt, she provided number [phone] which is pt's number, states he has a caregiver with him named Bethena Roys. Referrals REFERRED TO PCP OFFICE

## 2022-08-28 ENCOUNTER — Ambulatory Visit (INDEPENDENT_AMBULATORY_CARE_PROVIDER_SITE_OTHER): Payer: Medicare HMO | Admitting: Family

## 2022-08-28 ENCOUNTER — Telehealth (HOSPITAL_COMMUNITY): Payer: Self-pay

## 2022-08-28 ENCOUNTER — Telehealth: Payer: Self-pay | Admitting: Family

## 2022-08-28 ENCOUNTER — Encounter: Payer: Self-pay | Admitting: Family

## 2022-08-28 VITALS — BP 124/76 | HR 76 | Temp 97.5°F | Resp 18 | Ht 76.0 in | Wt 286.6 lb

## 2022-08-28 DIAGNOSIS — I4819 Other persistent atrial fibrillation: Secondary | ICD-10-CM | POA: Diagnosis not present

## 2022-08-28 DIAGNOSIS — Z6834 Body mass index (BMI) 34.0-34.9, adult: Secondary | ICD-10-CM | POA: Diagnosis not present

## 2022-08-28 DIAGNOSIS — R350 Frequency of micturition: Secondary | ICD-10-CM | POA: Diagnosis not present

## 2022-08-28 DIAGNOSIS — R351 Nocturia: Secondary | ICD-10-CM | POA: Diagnosis not present

## 2022-08-28 DIAGNOSIS — Z951 Presence of aortocoronary bypass graft: Secondary | ICD-10-CM | POA: Diagnosis not present

## 2022-08-28 DIAGNOSIS — Z7901 Long term (current) use of anticoagulants: Secondary | ICD-10-CM | POA: Diagnosis not present

## 2022-08-28 DIAGNOSIS — Z7982 Long term (current) use of aspirin: Secondary | ICD-10-CM | POA: Diagnosis not present

## 2022-08-28 DIAGNOSIS — I25110 Atherosclerotic heart disease of native coronary artery with unstable angina pectoris: Secondary | ICD-10-CM | POA: Diagnosis not present

## 2022-08-28 DIAGNOSIS — I350 Nonrheumatic aortic (valve) stenosis: Secondary | ICD-10-CM | POA: Diagnosis not present

## 2022-08-28 DIAGNOSIS — I119 Hypertensive heart disease without heart failure: Secondary | ICD-10-CM | POA: Diagnosis not present

## 2022-08-28 DIAGNOSIS — R69 Illness, unspecified: Secondary | ICD-10-CM | POA: Diagnosis not present

## 2022-08-28 DIAGNOSIS — I4892 Unspecified atrial flutter: Secondary | ICD-10-CM | POA: Diagnosis not present

## 2022-08-28 DIAGNOSIS — N401 Enlarged prostate with lower urinary tract symptoms: Secondary | ICD-10-CM | POA: Diagnosis not present

## 2022-08-28 LAB — POCT URINALYSIS DIP (MANUAL ENTRY)
Blood, UA: NEGATIVE
Glucose, UA: NEGATIVE mg/dL
Leukocytes, UA: NEGATIVE
Nitrite, UA: NEGATIVE
Protein Ur, POC: NEGATIVE mg/dL
Spec Grav, UA: 1.025 (ref 1.010–1.025)
Urobilinogen, UA: 1 E.U./dL
pH, UA: 6 (ref 5.0–8.0)

## 2022-08-28 LAB — PSA: PSA: 3.63 ng/mL (ref 0.10–4.00)

## 2022-08-28 MED ORDER — ALFUZOSIN HCL ER 10 MG PO TB24: 10.0000 mg | ORAL_TABLET | Freq: Every day | ORAL | 1 refills | Status: DC

## 2022-08-28 NOTE — Telephone Encounter (Signed)
Pt has called back and I have relayed the message from the provider. They stated understanding. We will give them a call back with the results.

## 2022-08-28 NOTE — Telephone Encounter (Signed)
Please let him know that we called in a medication called Uroxatral that will hopefully help with his symptoms and does not have interactions with the Amiodarone.

## 2022-08-28 NOTE — Telephone Encounter (Signed)
I have called the pt and left a message to call back since there was no answer. The phone went straight to VM.

## 2022-08-28 NOTE — Progress Notes (Signed)
Hector Edwards is a 76 y.o. male with the following history as recorded in EpicCare:  Patient Active Problem List   Diagnosis Date Noted   Secondary hypercoagulable state ([place]) 08/22/2022   Persistent atrial fibrillation (HCC)    S/P CABG x 3 08/03/2022   Coronary artery disease involving native coronary artery of native heart with unstable angina pectoris (HCC)    Class 2 severe obesity with serious comorbidity and body mass index (BMI) of 37.0 to 37.9 in adult, unspecified obesity type ([place]) 10/27/2020   OSA (obstructive sleep apnea) 08/01/2018   Benign prostatic hyperplasia with nocturia 11/26/2016   Prostate cancer screening 11/26/2016   Medicare annual wellness visit, subsequent 11/26/2016   Hx of adenomatous colonic polyps 09/04/2015   Annual physical exam 07/20/2015   Visit for preventive health examination 05/04/2013   Erectile dysfunction 05/04/2013   Snoring 02/13/2012   GERD (gastroesophageal reflux disease) 08/12/2011   Osteoarthritis 08/08/2011   Hyperlipidemia 10/13/2007   Essential hypertension 10/13/2007    Current Outpatient Medications  Medication Sig Dispense Refill   [START ON 08/29/2022] amiodarone (PACERONE) 200 MG tablet Take 1 tablet (200 mg total) by mouth daily. 30 tablet 3   apixaban (ELIQUIS) 5 MG TABS tablet Take 1 tablet (5 mg total) by mouth 2 (two) times daily. 60 tablet 3   aspirin EC 81 MG tablet Take 1 tablet (81 mg total) by mouth daily. Swallow whole. 30 tablet 12   atorvastatin (LIPITOR) 80 MG tablet Take 1 tablet (80 mg total) by mouth daily. 90 tablet 3   benazepril (LOTENSIN) 20 MG tablet Take 1 tablet (20 mg total) by mouth at bedtime. 90 tablet 3   diltiazem (CARDIZEM CD) 180 MG 24 hr capsule Take 1 capsule (180 mg total) by mouth daily. 90 capsule 3   metoprolol tartrate (LOPRESSOR) 25 MG tablet Take 1 tablet (25 mg total) by mouth 2 (two) times daily. 180 tablet 3   pantoprazole (PROTONIX) 40 MG tablet TAKE ONE TABLET BY MOUTH EVERY  EVENING 30 tablet 0   No current facility-administered medications for this visit.    Allergies: Hctz [hydrochlorothiazide], Enalapril maleate, Tribenzor [olmesartan-amlodipine-hctz], and Verapamil  Past Medical History:  Diagnosis Date   Depression    GERD (gastroesophageal reflux disease)    Hx of adenomatous colonic polyps 09/04/2015   Hyperlipidemia    Hypertension    LBP (low back pain)    OSA (obstructive sleep apnea) 08/01/2018   Sleep apnea    wears cpap     Past Surgical History:  Procedure Laterality Date   CARDIOVERSION N/A 08/08/2022   Procedure: CARDIOVERSION;  Surgeon: Skeet Latch, MD;  Location: [place];  Service: Cardiovascular;  Laterality: N/A;   COLONOSCOPY  2004, 2021   hyperplastic polyps x 2   CORONARY ARTERY BYPASS GRAFT N/A 08/03/2022   Procedure: CORONARY ARTERY BYPASS GRAFTING (CABG) 3X, USING RIGHT GREATER SAPHENOUS VEIN AND LEFT INTERNAL MAMMARY ARTERY.;  Surgeon: Gaye Pollack, MD;  [facility];  Service: Open Heart Surgery;  Laterality: N/A;   KNEE ARTHROSCOPY Right    LEFT HEART CATH AND CORONARY ANGIOGRAPHY N/A 08/01/2022   Procedure: LEFT HEART CATH AND CORONARY ANGIOGRAPHY;  Surgeon: Burnell Blanks, MD;  Location: [place] [facility];  Service: Cardiovascular;  Laterality: N/A;   PENILE PROSTHESIS IMPLANT N/A 01/25/2020   Procedure: PENILE PROTHESIS INFLATABLE COLOPLAST;  Surgeon: Lucas Mallow, MD;  Location: [place];  Service: Urology;  Laterality: N/A;   SHOULDER ARTHROSCOPY W/  ROTATOR CUFF REPAIR Left    TEE WITHOUT CARDIOVERSION N/A 08/03/2022   Procedure: TRANSESOPHAGEAL ECHOCARDIOGRAM (TEE);  Surgeon: Gaye Pollack, MD;  Location: [place];  Service: Open Heart Surgery;  Laterality: N/A;   TEE WITHOUT CARDIOVERSION N/A 08/08/2022   Procedure: TRANSESOPHAGEAL ECHOCARDIOGRAM (TEE);  Surgeon: Skeet Latch, MD;  Location: [facility] ENDOSCOPY;  Service: Cardiovascular;  Laterality: N/A;   TONSILLECTOMY   as child   vocal cord growth removed  age 75   benign   28 TOOTH EXTRACTION      Family History  Problem Relation Age of Onset   Stroke Mother 36   Depression Mother    Hypertension Father    Heart disease Father    Colon polyps Father    Pancreatic cancer Brother    Colon polyps Brother    Throat cancer Brother    Breast cancer Daughter    Hypertension Other    Colon cancer Neg Hx    Esophageal cancer Neg Hx    Stomach cancer Neg Hx    Rectal cancer Neg Hx     Social History   Tobacco Use   Smoking status: Never   Smokeless tobacco: Never   Tobacco comments:    Never smoke 08/22/22  Substance Use Topics   Alcohol use: Not Currently    Alcohol/week: 4.0 standard drinks of alcohol    Types: 4 Cans of beer per week    Comment: stop drinking 08/22/22    Subjective:  Accompanied by friend; complaining of worsening urinary symptoms- especially nocturia x " months." Hgba1c was checked 3 weeks ago- normal at 5.4; Did feel like symptoms worsened while in the hospital- had cath in place; was treated with antibiotics at time of discharge; has completed Omnicef in the past few days- no change in urinary symptoms.    Objective:  Vitals:   08/28/22 1115  BP: 124/76  Pulse: 76  Resp: 18  Temp: (!) 97.5 F (36.4 C)  TempSrc: Oral  SpO2: 99%  Weight: 286 lb 9.6 oz (130 kg)  Height: '6\' 4"'$  (1.93 m)    General: Well developed, well nourished, in no acute distress  Skin : Warm and dry.  Head: Normocephalic and atraumatic  Lungs: Respirations unlabored; clear to auscultation bilaterally without wheeze, rales, rhonchi  CVS exam: normal rate and regular rhythm.  Neurologic: Alert and oriented; speech intact; face symmetrical; moves all extremities well; CNII-XII intact without focal deficit   Assessment:  1. Urinary frequency   2. Nocturia     Plan:  Suspect BPH due to length of time symptoms present; most recent Hgba1c was normal at 5.4; will check urine culture due to  recent cath placement; check PSA as well; will give trial of Uroxatral to see how his symptoms respond; follow up to be determined;   No follow-ups on file.  Orders Placed This Encounter  Procedures   Urine Culture   PSA   POCT urinalysis dipstick    Requested Prescriptions    No prescriptions requested or ordered in this encounter

## 2022-08-28 NOTE — Telephone Encounter (Signed)
Referral faxed to [facility].

## 2022-08-28 NOTE — Patient Instructions (Signed)

## 2022-08-29 LAB — URINE CULTURE
MICRO NUMBER:: 14218578
Result:: NO GROWTH
SPECIMEN QUALITY:: ADEQUATE

## 2022-09-04 ENCOUNTER — Other Ambulatory Visit: Payer: Self-pay | Admitting: Surgery

## 2022-09-04 ENCOUNTER — Other Ambulatory Visit (INDEPENDENT_AMBULATORY_CARE_PROVIDER_SITE_OTHER): Payer: Medicare HMO

## 2022-09-04 DIAGNOSIS — R899 Unspecified abnormal finding in specimens from other organs, systems and tissues: Secondary | ICD-10-CM | POA: Diagnosis not present

## 2022-09-04 DIAGNOSIS — Z951 Presence of aortocoronary bypass graft: Secondary | ICD-10-CM

## 2022-09-04 LAB — CBC WITH DIFFERENTIAL/PLATELET
Basophils Absolute: 0 10*3/uL (ref 0.0–0.1)
Basophils Relative: 0.3 % (ref 0.0–3.0)
Eosinophils Absolute: 0.3 10*3/uL (ref 0.0–0.7)
Eosinophils Relative: 4.9 % (ref 0.0–5.0)
HCT: 37.8 % — ABNORMAL LOW (ref 39.0–52.0)
Hemoglobin: 12.6 g/dL — ABNORMAL LOW (ref 13.0–17.0)
Lymphocytes Relative: 26.1 % (ref 12.0–46.0)
Lymphs Abs: 1.4 10*3/uL (ref 0.7–4.0)
MCHC: 33.3 g/dL (ref 30.0–36.0)
MCV: 102.7 fl — ABNORMAL HIGH (ref 78.0–100.0)
Monocytes Absolute: 0.6 10*3/uL (ref 0.1–1.0)
Monocytes Relative: 10.9 % (ref 3.0–12.0)
Neutro Abs: 3.1 10*3/uL (ref 1.4–7.7)
Neutrophils Relative %: 57.8 % (ref 43.0–77.0)
Platelets: 115 10*3/uL — ABNORMAL LOW (ref 150.0–400.0)
RBC: 3.68 Mil/uL — ABNORMAL LOW (ref 4.22–5.81)
RDW: 18.1 % — ABNORMAL HIGH (ref 11.5–15.5)
WBC: 5.4 10*3/uL (ref 4.0–10.5)

## 2022-09-04 LAB — VITAMIN B12: Vitamin B-12: 783 pg/mL (ref 211–911)

## 2022-09-04 NOTE — Progress Notes (Signed)
Waiting on phone call back from patient regarding response to Uroxatral and if he wants urology referral; no acute concerning changes noted on these labs.

## 2022-09-05 ENCOUNTER — Ambulatory Visit (INDEPENDENT_AMBULATORY_CARE_PROVIDER_SITE_OTHER): Payer: Self-pay | Admitting: Surgery

## 2022-09-05 ENCOUNTER — Encounter: Payer: Self-pay | Admitting: Surgery

## 2022-09-05 ENCOUNTER — Ambulatory Visit
Admission: RE | Admit: 2022-09-05 | Discharge: 2022-09-05 | Disposition: A | Discharge status: Home / Self Care | Payer: Medicare HMO | Source: Ambulatory Visit | Attending: Surgery | Admitting: Surgery

## 2022-09-05 VITALS — BP 137/78 | HR 81 | Resp 18 | Ht 76.0 in | Wt 290.0 lb

## 2022-09-05 DIAGNOSIS — Z9889 Other specified postprocedural states: Secondary | ICD-10-CM | POA: Diagnosis not present

## 2022-09-05 DIAGNOSIS — R918 Other nonspecific abnormal finding of lung field: Secondary | ICD-10-CM | POA: Diagnosis not present

## 2022-09-05 DIAGNOSIS — Z951 Presence of aortocoronary bypass graft: Secondary | ICD-10-CM

## 2022-09-05 DIAGNOSIS — M1711 Unilateral primary osteoarthritis, right knee: Secondary | ICD-10-CM | POA: Diagnosis not present

## 2022-09-05 DIAGNOSIS — J90 Pleural effusion, not elsewhere classified: Secondary | ICD-10-CM | POA: Diagnosis not present

## 2022-09-05 DIAGNOSIS — J9811 Atelectasis: Secondary | ICD-10-CM | POA: Diagnosis not present

## 2022-09-05 NOTE — Progress Notes (Signed)
HPI: Patient returns for routine postoperative follow-up having undergone coronary bypass graft surgery x 3 on 08/03/2022. The patient's early postoperative recovery while in the hospital was notable for a slow postoperative course due to preoperative deconditioning and morbid obesity.  He developed postoperative atrial fibrillation that was difficult to control and was started on amiodarone and Cardizem as well as Lopressor.  He was subsequently cardioverted.  He was discharged on Eliquis. Since hospital discharge the patient reports that he has been feeling well.  His stamina is improving.  He has been walking 15 to 20 minutes/day without chest pain or shortness of breath.  He has been watching his diet and has lost some weight.  He was seen back in the atrial fibrillation clinic as well as by cardiology.  He is here today with his sister.   Current Outpatient Medications  Medication Sig Dispense Refill   alfuzosin (UROXATRAL) 10 MG 24 hr tablet Take 1 tablet (10 mg total) by mouth daily with breakfast. 30 tablet 1   amiodarone (PACERONE) 200 MG tablet Take 1 tablet (200 mg total) by mouth daily. 30 tablet 3   apixaban (ELIQUIS) 5 MG TABS tablet Take 1 tablet (5 mg total) by mouth 2 (two) times daily. 60 tablet 3   aspirin EC 81 MG tablet Take 1 tablet (81 mg total) by mouth daily. Swallow whole. 30 tablet 12   atorvastatin (LIPITOR) 80 MG tablet Take 1 tablet (80 mg total) by mouth daily. 90 tablet 3   benazepril (LOTENSIN) 20 MG tablet Take 1 tablet (20 mg total) by mouth at bedtime. 90 tablet 3   diltiazem (CARDIZEM CD) 180 MG 24 hr capsule Take 1 capsule (180 mg total) by mouth daily. 90 capsule 3   metoprolol tartrate (LOPRESSOR) 25 MG tablet Take 1 tablet (25 mg total) by mouth 2 (two) times daily. 180 tablet 3   pantoprazole (PROTONIX) 40 MG tablet TAKE ONE TABLET BY MOUTH EVERY EVENING 30 tablet 0   No current facility-administered medications for this visit.    Physical Exam: BP  137/78 (BP Location: Left Arm, Patient Position: Sitting)   Pulse 81   Resp 18   Ht '6\' 4"'$  (1.93 m)   Wt 290 lb (131.5 kg)   SpO2 97% Comment: RA  BMI 35.30 kg/m  He looks well. Cardiac exam shows a regular rate and rhythm with normal heart sounds.  There is no murmur. Lungs are clear. The chest incision is healing well and the sternum is stable. His leg incision is healing well and there is no peripheral edema.  Diagnostic Tests:  Narrative & Impression  CLINICAL DATA:  Status post CABG x3   EXAM: CHEST - 2 VIEW   COMPARISON:  08/14/2022   FINDINGS: Postsurgical changes of CABG again seen. No significant pulmonary vascular congestion.   Left basilar, retrocardiac airspace opacity likely due to atelectasis. Linear opacities in the left hilar region also favored to be atelectasis. Small left pleural effusion is present.   IMPRESSION: Small left pleural effusion with adjacent atelectasis.     Electronically Signed   By: Miachel Roux M.D.   On: 09/05/2022 09:54      Impression:  Overall I think he is making good progress following his surgery.  His chest x-ray shows a trivial left pleural effusion blunting the costophrenic angle.  I encouraged him to continue walking is much as possible.  He is planning on participating in the Heart Stride program at [facility].  I  think he could start that program anytime now.  I told him that he could return to driving a car but should refrain from lifting anything heavier than 10 pounds for 3 months postoperatively.  Plan:  He will continue to follow-up with cardiology and in the atrial fibrillation clinic.  He will return to see me if he has any problems with his incisions.   Gaye Pollack, MD [facility] [phone]

## 2022-09-06 DIAGNOSIS — R351 Nocturia: Secondary | ICD-10-CM | POA: Diagnosis not present

## 2022-09-06 DIAGNOSIS — I4892 Unspecified atrial flutter: Secondary | ICD-10-CM | POA: Diagnosis not present

## 2022-09-06 DIAGNOSIS — I25110 Atherosclerotic heart disease of native coronary artery with unstable angina pectoris: Secondary | ICD-10-CM | POA: Diagnosis not present

## 2022-09-06 DIAGNOSIS — Z7982 Long term (current) use of aspirin: Secondary | ICD-10-CM | POA: Diagnosis not present

## 2022-09-06 DIAGNOSIS — Z951 Presence of aortocoronary bypass graft: Secondary | ICD-10-CM | POA: Diagnosis not present

## 2022-09-06 DIAGNOSIS — I119 Hypertensive heart disease without heart failure: Secondary | ICD-10-CM | POA: Diagnosis not present

## 2022-09-06 DIAGNOSIS — I350 Nonrheumatic aortic (valve) stenosis: Secondary | ICD-10-CM | POA: Diagnosis not present

## 2022-09-06 DIAGNOSIS — Z6834 Body mass index (BMI) 34.0-34.9, adult: Secondary | ICD-10-CM | POA: Diagnosis not present

## 2022-09-06 DIAGNOSIS — Z7901 Long term (current) use of anticoagulants: Secondary | ICD-10-CM | POA: Diagnosis not present

## 2022-09-06 DIAGNOSIS — N401 Enlarged prostate with lower urinary tract symptoms: Secondary | ICD-10-CM | POA: Diagnosis not present

## 2022-09-06 DIAGNOSIS — R69 Illness, unspecified: Secondary | ICD-10-CM | POA: Diagnosis not present

## 2022-09-06 DIAGNOSIS — I4819 Other persistent atrial fibrillation: Secondary | ICD-10-CM | POA: Diagnosis not present

## 2022-09-11 DIAGNOSIS — Z951 Presence of aortocoronary bypass graft: Secondary | ICD-10-CM | POA: Diagnosis not present

## 2022-09-11 DIAGNOSIS — N401 Enlarged prostate with lower urinary tract symptoms: Secondary | ICD-10-CM | POA: Diagnosis not present

## 2022-09-11 DIAGNOSIS — Z6834 Body mass index (BMI) 34.0-34.9, adult: Secondary | ICD-10-CM | POA: Diagnosis not present

## 2022-09-11 DIAGNOSIS — I4819 Other persistent atrial fibrillation: Secondary | ICD-10-CM | POA: Diagnosis not present

## 2022-09-11 DIAGNOSIS — I25110 Atherosclerotic heart disease of native coronary artery with unstable angina pectoris: Secondary | ICD-10-CM | POA: Diagnosis not present

## 2022-09-11 DIAGNOSIS — I350 Nonrheumatic aortic (valve) stenosis: Secondary | ICD-10-CM | POA: Diagnosis not present

## 2022-09-11 DIAGNOSIS — Z7901 Long term (current) use of anticoagulants: Secondary | ICD-10-CM | POA: Diagnosis not present

## 2022-09-11 DIAGNOSIS — I4892 Unspecified atrial flutter: Secondary | ICD-10-CM | POA: Diagnosis not present

## 2022-09-11 DIAGNOSIS — I119 Hypertensive heart disease without heart failure: Secondary | ICD-10-CM | POA: Diagnosis not present

## 2022-09-11 DIAGNOSIS — R69 Illness, unspecified: Secondary | ICD-10-CM | POA: Diagnosis not present

## 2022-09-11 DIAGNOSIS — Z7982 Long term (current) use of aspirin: Secondary | ICD-10-CM | POA: Diagnosis not present

## 2022-09-11 DIAGNOSIS — R351 Nocturia: Secondary | ICD-10-CM | POA: Diagnosis not present

## 2022-09-12 DIAGNOSIS — M1711 Unilateral primary osteoarthritis, right knee: Secondary | ICD-10-CM | POA: Diagnosis not present

## 2022-09-20 DIAGNOSIS — I350 Nonrheumatic aortic (valve) stenosis: Secondary | ICD-10-CM | POA: Diagnosis not present

## 2022-09-20 DIAGNOSIS — I4892 Unspecified atrial flutter: Secondary | ICD-10-CM | POA: Diagnosis not present

## 2022-09-20 DIAGNOSIS — Z7982 Long term (current) use of aspirin: Secondary | ICD-10-CM | POA: Diagnosis not present

## 2022-09-20 DIAGNOSIS — R351 Nocturia: Secondary | ICD-10-CM | POA: Diagnosis not present

## 2022-09-20 DIAGNOSIS — Z951 Presence of aortocoronary bypass graft: Secondary | ICD-10-CM | POA: Diagnosis not present

## 2022-09-20 DIAGNOSIS — I119 Hypertensive heart disease without heart failure: Secondary | ICD-10-CM | POA: Diagnosis not present

## 2022-09-20 DIAGNOSIS — Z6834 Body mass index (BMI) 34.0-34.9, adult: Secondary | ICD-10-CM | POA: Diagnosis not present

## 2022-09-20 DIAGNOSIS — N401 Enlarged prostate with lower urinary tract symptoms: Secondary | ICD-10-CM | POA: Diagnosis not present

## 2022-09-20 DIAGNOSIS — I25110 Atherosclerotic heart disease of native coronary artery with unstable angina pectoris: Secondary | ICD-10-CM | POA: Diagnosis not present

## 2022-09-20 DIAGNOSIS — I4819 Other persistent atrial fibrillation: Secondary | ICD-10-CM | POA: Diagnosis not present

## 2022-09-20 DIAGNOSIS — R69 Illness, unspecified: Secondary | ICD-10-CM | POA: Diagnosis not present

## 2022-09-20 DIAGNOSIS — Z7901 Long term (current) use of anticoagulants: Secondary | ICD-10-CM | POA: Diagnosis not present

## 2022-10-16 DIAGNOSIS — Z951 Presence of aortocoronary bypass graft: Secondary | ICD-10-CM | POA: Diagnosis not present

## 2022-10-17 DIAGNOSIS — Z951 Presence of aortocoronary bypass graft: Secondary | ICD-10-CM | POA: Diagnosis not present

## 2022-10-18 ENCOUNTER — Other Ambulatory Visit: Payer: Self-pay

## 2022-10-18 ENCOUNTER — Telehealth: Payer: Self-pay | Admitting: Family

## 2022-10-18 DIAGNOSIS — Z951 Presence of aortocoronary bypass graft: Secondary | ICD-10-CM | POA: Diagnosis not present

## 2022-10-18 DIAGNOSIS — K219 Gastro-esophageal reflux disease without esophagitis: Secondary | ICD-10-CM

## 2022-10-18 MED ORDER — PANTOPRAZOLE SODIUM 40 MG PO TBEC: 40.0000 mg | DELAYED_RELEASE_TABLET | Freq: Every evening | ORAL | 0 refills | Status: DC

## 2022-10-18 NOTE — Telephone Encounter (Signed)
Rx refilled.

## 2022-10-18 NOTE — Telephone Encounter (Signed)
Prescription Request  10/18/2022  Is this a "Controlled Substance" medicine? No  LOV: 08/28/2022  What is the name of the medication or equipment? pantoprazole (PROTONIX) 40 MG tablet   Have you contacted your pharmacy to request a refill? Yes - out of refills  Which pharmacy would you like this sent to?   [facility] [place], [place] - [address] [place] [address] Phone: [phone] Fax: [phone]    Patient notified that their request is being sent to the clinical staff for review and that they should receive a response within 2 business days.   Please advise at [place] : Sister's [phone]

## 2022-10-24 DIAGNOSIS — Z951 Presence of aortocoronary bypass graft: Secondary | ICD-10-CM | POA: Diagnosis not present

## 2022-10-25 DIAGNOSIS — Z951 Presence of aortocoronary bypass graft: Secondary | ICD-10-CM | POA: Diagnosis not present

## 2022-10-27 NOTE — Progress Notes (Deleted)
Cardiology Office Note:    Date:  10/27/2022   ID:  Hector Edwards, DOB 1946/08/21, MRN [id number]  PCP:  [name], [place] [facility] Cardiologist:  Lauree Chandler, MD     Referring MD: [name],*   Chief Complaint: hospital follow-up post CABG  History of Present Illness:    Hector Edwards is a pleasant 77 y.o. male with a hx of CAD s/p CABG, obesity, HLD, HTN, GERD, OSA, mild AS, and depression  Referred to cardiology for dyspnea on exertion and seen by Dr. Angelena Form 06/13/2022.  Previously seen in 2017 by Dr. Irish Lack.  Echo July 2017 with normal LV function, no valve disease.  Nuclear stress test 2017 with no ischemia.  Echo September 2020 with normal LV function and no valvular disease.  At office visit 06/13/2022 he described progressive dyspnea and fatigue but no chest pain.  Rare LE edema.  Has sleep apnea but does not tolerate CPAP.  Echo 07/05/2022 with LVEF 60 to 65%, moderate LVH, mild aortic stenosis with mean gradient of 12 mmHg.  Coronary CTA 07/24/2022 revealed severe disease in the LAD and RCA that appears to be flow-limiting by CT FFR.  Seen in follow-up by Dr. Angelena Form on 07/27/2022. He continued to report dyspnea and fatigue.   He underwent left heart catheterization on 08/01/2022 which revealed severe distal left main/three-vessel CAD.  Severe, heavily calcified eccentric distal left main stenosis, severe ostial LAD stenosis, severe mid LAD stenosis, severe ostial circumflex stenosis, large dominant RCA with severe ostial stenosis.  Severe mid to distal vessel stenosis, severe stenosis in proximal posterior lateral artery, severe ostial PDA stenosis.  LVEDP 18 mmHg.  Recommendation to consult CT surgery for bypass. He underwent CABG x 3 (LIMA-LAD, SVG-PDA, SVG-PL (RCA), endoscopy vein harvest right leg on 08/03/22. Post-op course complicated by A Fib RVR  that persisted despite cardizem and digoxin. He underwent cardioversion on  08/08/2022.  Follow-up CXR showed prominent central pulmonary vessels suggesting CHF and increased small to moderate bilateral pleural effusions which were treated with 1 dose of IV diuretic and he was restarted on daily oral diuretic.  He developed LUE superficial phlebitis around his peripheral IV site and was prescribed cefdinir x1 week.  e became diaphoretic and presyncopal during stair training with PT/OT.  Concern about living alone in a 3 story home patient ultimately refused SNF.  Repeat echo 11/6 was suboptimal due to obesity, rapid atrial fibrillation and recent surgery but showed LVEF 60 to 65%. He was discharged 08/18/22.   Seen in clinic by me on 08/23/22 for post hospital follow-up. Reported feeling well with just mild chest soreness when coughing. His sister and a close friend are helping him at home and home health has been out once. Walking 10 minutes a few times per day without chest pain or shortness of breath. Is sleeping well, no orthopnea, edema, or PND. Denied presyncope, syncope, palpitations. Has not had felt a fib like he felt in the hospital. Seen in A Fib clinic on 11/15 by Adline Peals, PA and is continuing Eliquis and amiodarone as directed. No bleeding concerns. Reported some mild soreness right leg at site of vein harvest.  Follow-up CXR with Dr. Cyndia Bent on 09/05/22 revealed trivial left pleural effusion blunting the costophrenic angle. Was cleared to participate in heart stride program at [facility]. Could resume driving but should continue no lifting > 10 lb for 3 mo postop.  Today, he is here  Past Medical History:  Diagnosis Date   Depression    GERD (gastroesophageal reflux disease)    Hx of adenomatous colonic polyps 09/04/2015   Hyperlipidemia    Hypertension    LBP (low back pain)    OSA (obstructive sleep apnea) 08/01/2018   Sleep apnea    wears cpap     Past Surgical History:  Procedure Laterality Date   CARDIOVERSION N/A 08/08/2022    Procedure: CARDIOVERSION;  Surgeon: Skeet Latch, MD;  Location: [facility];  Service: Cardiovascular;  Laterality: N/A;   COLONOSCOPY  2004, 2021   hyperplastic polyps x 2   CORONARY ARTERY BYPASS GRAFT N/A 08/03/2022   Procedure: CORONARY ARTERY BYPASS GRAFTING (CABG) 3X, USING RIGHT GREATER SAPHENOUS VEIN AND LEFT INTERNAL MAMMARY ARTERY.;  Surgeon: Gaye Pollack, MD;  Location: [facility];  Service: Open Heart Surgery;  Laterality: N/A;   KNEE ARTHROSCOPY Right    LEFT HEART CATH AND CORONARY ANGIOGRAPHY N/A 08/01/2022   Procedure: LEFT HEART CATH AND CORONARY ANGIOGRAPHY;  Surgeon: Burnell Blanks, MD;  Location: [facility];  Service: Cardiovascular;  Laterality: N/A;   PENILE PROSTHESIS IMPLANT N/A 01/25/2020   Procedure: PENILE PROTHESIS INFLATABLE COLOPLAST;  Surgeon: Lucas Mallow, MD;  Location: [facility];  Service: Urology;  Laterality: N/A;   SHOULDER ARTHROSCOPY W/ ROTATOR CUFF REPAIR Left    TEE WITHOUT CARDIOVERSION N/A 08/03/2022   Procedure: TRANSESOPHAGEAL ECHOCARDIOGRAM (TEE);  Surgeon: Gaye Pollack, MD;  Location: [facility];  Service: Open Heart Surgery;  Laterality: N/A;   TEE WITHOUT CARDIOVERSION N/A 08/08/2022   Procedure: TRANSESOPHAGEAL ECHOCARDIOGRAM (TEE);  Surgeon: Skeet Latch, MD;  Location: [facility] ENDOSCOPY;  Service: Cardiovascular;  Laterality: N/A;   TONSILLECTOMY  as child   vocal cord growth removed  age 69   benign   WISDOM TOOTH EXTRACTION      Current Medications: No outpatient medications have been marked as taking for the 10/30/22 encounter (Appointment) with Ann Maki, Lanice Schwab, NP.     Allergies:   Hctz [hydrochlorothiazide], Enalapril maleate, Tribenzor [olmesartan-amlodipine-hctz], and Verapamil   Social History   Socioeconomic History   Marital status: Divorced    Spouse name: Not on file   Number of children: 1   Years of education: Not on file   Highest education level: Not on file  Occupational  History   Occupation: Retired   Occupation: Retired-mailman  Tobacco Use   Smoking status: Never   Smokeless tobacco: Never   Tobacco comments:    Never smoke 08/22/22  Vaping Use   Vaping Use: Never used  Substance and Sexual Activity   Alcohol use: Not Currently    Alcohol/week: 4.0 standard drinks of alcohol    Types: 4 Cans of beer per week    Comment: stop drinking 08/22/22   Drug use: No   Sexual activity: Not Currently  Other Topics Concern   Not on file  Social History Narrative   Regular exercise- yes, seldom; working physically all the time.   Social Determinants of Health   Financial Resource Strain: Low Risk  (03/06/2022)   Overall Financial Resource Strain (CARDIA)    Difficulty of Paying Living Expenses: Not hard at all  Food Insecurity: No Food Insecurity (08/07/2022)   Hunger Vital Sign    Worried About Running Out of Food in the Last Year: Never true    Ran Out of Food in the Last Year: Never true  Transportation Needs: No Transportation Needs (08/07/2022)   PRAPARE -  Hydrologist (Medical): No    Lack of Transportation (Non-Medical): No  Physical Activity: Inactive (03/06/2022)   Exercise Vital Sign    Days of Exercise per Week: 0 days    Minutes of Exercise per Session: 0 min  Stress: No Stress Concern Present (03/06/2022)   [place]    Feeling of Stress : Not at all  Social Connections: Moderately Integrated (03/06/2022)   Social Connection and Isolation Panel [NHANES]    Frequency of Communication with Friends and Family: More than three times a week    Frequency of Social Gatherings with Friends and Family: More than three times a week    Attends Religious Services: 1 to 4 times per year    Active Member of [employer] or Organizations: Yes    Attends Archivist Meetings: 1 to 4 times per year    Marital Status: Divorced     Family History: The  patient's family history includes Breast cancer in his daughter; Colon polyps in his brother and father; Depression in his mother; Heart disease in his father; Hypertension in his father and another family member; Pancreatic cancer in his brother; Stroke (age of onset: 35) in his mother; Throat cancer in his brother. There is no history of Colon cancer, Esophageal cancer, Stomach cancer, or Rectal cancer.  ROS:   Please see the history of present illness.    *** All other systems reviewed and are negative.  Labs/Other Studies Reviewed:    The following studies were reviewed today:  LHC 08/01/22    Mid RCA to Dist RCA lesion is 90% stenosed.   Ost RCA to Prox RCA lesion is 80% stenosed.   RPAV lesion is 99% stenosed.   RPDA lesion is 80% stenosed.   Mid LM to Dist LM lesion is 80% stenosed.   Ost Cx to Prox Cx lesion is 70% stenosed.   Dist LM to Ost LAD lesion is 80% stenosed.   Prox LAD to Mid LAD lesion is 60% stenosed.   Mid LAD lesion is 80% stenosed.   Severe distal left main/three vessel CAD Severe, heavily calcified eccentric distal left main stenosis Severe ostial LAD stenosis. Severe mid LAD stenosis Severe ostial Circumflex stenosis.  Large dominant RCA with severe ostial stenosis. Severe mid to distal vessel stenosis. Severe stenosis in the proximal posterolateral artery. Severe ostial PDA stenosis.  LVEDP 18 mmHg   Recommendations: Severe left main and three vessel. Will admit to telemetry and consult CT surgery to review candidacy for bypass surgery. Will add statin, beta blocker and ASA.   Diagnostic Dominance: Right    VAS Pre CABG Carotid and Extremities 08/02/22  Summary:  Right Carotid: Velocities in the right ICA are consistent with a 1-39%  stenosis.   Left Carotid: Velocities in the left ICA are consistent with a 1-39%  stenosis.  Vertebrals: Bilateral vertebral arteries demonstrate antegrade flow.  Subclavians: Normal flow hemodynamics were seen in  the right subclavian  artery.              Elevated left subclavian artery velocities without evidence  of              focal stenosis.   Right ABI: Resting right ankle-brachial index is within normal range.  Left ABI: Resting left ankle-brachial index is within normal range.   Right Upper Extremity: Doppler waveforms remain within normal limits with  right radial compression. Doppler waveforms remain  within normal limits  with right ulnar compression.  Left Upper Extremity: Doppler waveforms remain within normal limits with  left radial compression. Doppler waveforms remain within normal limits  with left ulnar compression.      Echo 08/13/22  1. Technically difficult study. Left ventricular ejection fraction, by  estimation, is 60 to 65%. The left ventricle has normal function. The left  ventricle has no regional wall motion abnormalities. There is moderate  left ventricular hypertrophy. Left  ventricular diastolic parameters are indeterminate.   2. Right ventricular systolic function was not well visualized. The right  ventricular size is not well visualized.   3. A small pericardial effusion is present.   4. The mitral valve is normal in structure. No evidence of mitral valve  regurgitation.   5. The aortic valve was not well visualized. Aortic valve regurgitation  is not visualized.  Recent Labs: 06/05/2022: ALT 35; TSH 2.37 08/07/2022: Magnesium 2.2 08/17/2022: BUN 18; Creatinine, Ser 0.94; Potassium 4.0; Sodium 134 09/04/2022: Hemoglobin 12.6; Platelets 115.0  Recent Lipid Panel    Component Value Date/Time   CHOL 88 08/07/2022 0115   CHOL 170 06/16/2019 1035   TRIG 107 08/07/2022 0115   HDL 16 (L) 08/07/2022 0115   HDL 32 (L) 06/16/2019 1035   CHOLHDL 5.5 08/07/2022 0115   VLDL 21 08/07/2022 0115   LDLCALC 51 08/07/2022 0115   LDLCALC 121 (H) 06/16/2019 1035   LDLDIRECT 158.5 10/19/2008 0858     Risk Assessment/Calculations:    CHA2DS2-VASc Score = 3    This indicates a 3.2% annual risk of stroke. The patient's score is based upon: CHF History: 0 HTN History: 1 Diabetes History: 0 Stroke History: 0 Vascular Disease History: 1 Age Score: 1 Gender Score: 0    Physical Exam:    VS:  There were no vitals taken for this visit.    Wt Readings from Last 3 Encounters:  09/05/22 290 lb (131.5 kg)  08/28/22 286 lb 9.6 oz (130 kg)  08/23/22 283 lb (128.4 kg)     GEN:  Well nourished, well developed in no acute distress HEENT: Normal NECK: No JVD; No carotid bruits CARDIAC: RRR, no murmurs, rubs, gallops RESPIRATORY:  Diminished breath sounds LLL. Otherwise, clear to auscultation without rales, wheezing or rhonchi  ABDOMEN: Sternotomy with edges well-approximated, no erythema, no drainage. Soft, non-tender, non-distended MUSCULOSKELETAL:  No edema; No deformity. 2+ pedal pulses, equal bilaterally SKIN: Warm and dry NEUROLOGIC:  Alert and oriented x 3 PSYCHIATRIC:  Normal affect   EKG:  EKG is  ***   Diagnoses:    No diagnosis found.  Assessment and Plan:     CAD s/p CABG x 3 without angina: He is feeling well. Mild chest tenderness with coughing. Resuming walking up to 10 minutes several times per day. Advised continued sternal precautions and to gradually increase walking as tolerated. Does not want to participate in CR. He denies chest pain, shortness of breath, palpitations. Continue asa, metoprolol, atorvastatin.   Persistent atrial fibrillation on chronic anticoagulation: Maintaining sinus rhythm today. HR consistently in the 70s bpm at home. Seen in A Fib clinic on 11/15. Continue Eliquis and amiodarone. Understands instructions to decrease amiodarone dose after 10 days of therapy. Has follow-up scheduled with A Fib clinic in February. No bleeding concerns. Continue Eliquis, amiodarone, diltiazem, metoprolol.   Hyperlipidemia LDL goal < 70: LDL 51 on 08/07/22. Continue atorvastatin.   Hypertension: BP well-controlled today.  Home BP well-controlled as well, consistently < 130 mmHg.  Pleural effusion: Small left pleural effusion on CXR 08/14/22. He continues to have diminished breath sounds in left lung base. Encouraged continued incentive spirometry. No dyspnea, orthopnea, or PND. Has follow-up CXR scheduled in 2 weeks with Dr. Cyndia Bent.   OSA: Having problems with CPAP equipment. Recommended that he contact Dr. Halford Chessman for follow-up.      Disposition: ***  Medication Adjustments/Labs and Tests Ordered: Current medicines are reviewed at length with the patient today.  Concerns regarding medicines are outlined above.  No orders of the defined types were placed in this encounter.  No orders of the defined types were placed in this encounter.   There are no Patient Instructions on file for this visit.   Signed, Emmaline Life, NP  10/27/2022 8:15 AM    [place]

## 2022-10-28 ENCOUNTER — Other Ambulatory Visit: Payer: Self-pay | Admitting: Family

## 2022-10-29 DIAGNOSIS — Z951 Presence of aortocoronary bypass graft: Secondary | ICD-10-CM | POA: Diagnosis not present

## 2022-10-30 ENCOUNTER — Ambulatory Visit: Payer: Medicare HMO | Admitting: Nurse Practitioner

## 2022-10-31 DIAGNOSIS — Z951 Presence of aortocoronary bypass graft: Secondary | ICD-10-CM | POA: Diagnosis not present

## 2022-11-01 DIAGNOSIS — Z951 Presence of aortocoronary bypass graft: Secondary | ICD-10-CM | POA: Diagnosis not present

## 2022-11-05 DIAGNOSIS — Z951 Presence of aortocoronary bypass graft: Secondary | ICD-10-CM | POA: Diagnosis not present

## 2022-11-07 DIAGNOSIS — Z951 Presence of aortocoronary bypass graft: Secondary | ICD-10-CM | POA: Diagnosis not present

## 2022-11-08 DIAGNOSIS — Z48812 Encounter for surgical aftercare following surgery on the circulatory system: Secondary | ICD-10-CM | POA: Diagnosis not present

## 2022-11-08 DIAGNOSIS — Z951 Presence of aortocoronary bypass graft: Secondary | ICD-10-CM | POA: Diagnosis not present

## 2022-11-12 DIAGNOSIS — Z48812 Encounter for surgical aftercare following surgery on the circulatory system: Secondary | ICD-10-CM | POA: Diagnosis not present

## 2022-11-12 DIAGNOSIS — Z951 Presence of aortocoronary bypass graft: Secondary | ICD-10-CM | POA: Diagnosis not present

## 2022-11-14 DIAGNOSIS — Z48812 Encounter for surgical aftercare following surgery on the circulatory system: Secondary | ICD-10-CM | POA: Diagnosis not present

## 2022-11-14 DIAGNOSIS — Z951 Presence of aortocoronary bypass graft: Secondary | ICD-10-CM | POA: Diagnosis not present

## 2022-11-15 DIAGNOSIS — Z951 Presence of aortocoronary bypass graft: Secondary | ICD-10-CM | POA: Diagnosis not present

## 2022-11-15 DIAGNOSIS — Z48812 Encounter for surgical aftercare following surgery on the circulatory system: Secondary | ICD-10-CM | POA: Diagnosis not present

## 2022-11-19 DIAGNOSIS — Z48812 Encounter for surgical aftercare following surgery on the circulatory system: Secondary | ICD-10-CM | POA: Diagnosis not present

## 2022-11-19 DIAGNOSIS — Z951 Presence of aortocoronary bypass graft: Secondary | ICD-10-CM | POA: Diagnosis not present

## 2022-11-20 ENCOUNTER — Telehealth: Payer: Self-pay | Admitting: Interventional Cardiology

## 2022-11-20 NOTE — Telephone Encounter (Signed)
Amber states their office received a voice message from pre-op requesting to confirm this patient's DOB for their clearance request. Amber provided DOB and confirmed that this is the patient needing clearance.

## 2022-11-20 NOTE — Telephone Encounter (Signed)
   Patient Name: Hector Edwards  DOB: 12/04/1945 MRN: [id number]  Primary Cardiologist: Lauree Chandler, MD  Chart reviewed as part of pre-operative protocol coverage.   Simple dental extractions (i.e. 1-2 teeth) are considered low risk procedures per guidelines and generally do not require any specific cardiac clearance. It is also generally accepted that for simple extractions and dental cleanings, there is no need to interrupt blood thinner therapy.   SBE prophylaxis is required for the patient from a cardiac standpoint and per patient he is already taking antibiotics provided by his DDS.  I will route this recommendation to the requesting party via Epic fax function and remove from pre-op pool.  Please call with questions.  Mable Fill, Marissa Nestle, NP 11/20/2022, 11:06 AM

## 2022-11-20 NOTE — Telephone Encounter (Signed)
   Pre-operative Risk Assessment    Patient Name: Hector Edwards  DOB: September 24, 1946 MRN: [id number]     Request for Surgical Clearance    Procedure:  Dental Extraction - Amount of Teeth to be Pulled:  1 TOOTH TO BE EXTRACTED; SIMPLE EXTRACTION ; I DID CONFIRM NOT A SURGICAL EXTRACTION  Date of Surgery:  Clearance TBD                                 Surgeon:  DR. Ulice Brilliant, DDS Surgeon's Group or Practice Name:  DR. Ulice Brilliant, DDS Phone number:  [phone] Fax number:  [phone]   Type of Clearance Requested:   - Medical  - Pharmacy:  Hold Aspirin and Apixaban (Eliquis) ; PT ALSO STATES THE DDS OFFICE THE HE IS ALREADY ON ABX   Type of Anesthesia:  Local    Additional requests/questions:    Jiles Prows   11/20/2022, 11:01 AM

## 2022-11-21 ENCOUNTER — Ambulatory Visit (HOSPITAL_COMMUNITY): Payer: Medicare HMO | Admitting: Physician Assistant

## 2022-11-26 DIAGNOSIS — Z48812 Encounter for surgical aftercare following surgery on the circulatory system: Secondary | ICD-10-CM | POA: Diagnosis not present

## 2022-11-26 DIAGNOSIS — Z951 Presence of aortocoronary bypass graft: Secondary | ICD-10-CM | POA: Diagnosis not present

## 2022-11-27 ENCOUNTER — Ambulatory Visit (HOSPITAL_COMMUNITY): Payer: Medicare HMO | Admitting: Physician Assistant

## 2022-11-28 DIAGNOSIS — Z951 Presence of aortocoronary bypass graft: Secondary | ICD-10-CM | POA: Diagnosis not present

## 2022-11-28 DIAGNOSIS — Z48812 Encounter for surgical aftercare following surgery on the circulatory system: Secondary | ICD-10-CM | POA: Diagnosis not present

## 2022-11-29 ENCOUNTER — Encounter (HOSPITAL_COMMUNITY): Payer: Self-pay | Admitting: Nurse Practitioner

## 2022-11-29 ENCOUNTER — Ambulatory Visit (HOSPITAL_COMMUNITY)
Admission: RE | Admit: 2022-11-29 | Discharge: 2022-11-29 | Disposition: A | Discharge status: Home / Self Care | Payer: Medicare HMO | Source: Ambulatory Visit | Attending: Physician Assistant | Admitting: Physician Assistant

## 2022-11-29 ENCOUNTER — Inpatient Hospital Stay (HOSPITAL_COMMUNITY)
Admission: RE | Admit: 2022-11-29 | Discharge: 2022-11-29 | Disposition: A | Discharge status: Home / Self Care | Payer: Medicare HMO | Source: Ambulatory Visit | Attending: Nurse Practitioner | Admitting: Nurse Practitioner

## 2022-11-29 VITALS — BP 126/70 | HR 73 | Ht 76.0 in | Wt 291.8 lb

## 2022-11-29 DIAGNOSIS — Z6834 Body mass index (BMI) 34.0-34.9, adult: Secondary | ICD-10-CM | POA: Diagnosis not present

## 2022-11-29 DIAGNOSIS — G4733 Obstructive sleep apnea (adult) (pediatric): Secondary | ICD-10-CM | POA: Insufficient documentation

## 2022-11-29 DIAGNOSIS — Z7982 Long term (current) use of aspirin: Secondary | ICD-10-CM | POA: Insufficient documentation

## 2022-11-29 DIAGNOSIS — I480 Paroxysmal atrial fibrillation: Secondary | ICD-10-CM | POA: Diagnosis not present

## 2022-11-29 DIAGNOSIS — E669 Obesity, unspecified: Secondary | ICD-10-CM | POA: Insufficient documentation

## 2022-11-29 DIAGNOSIS — I10 Essential (primary) hypertension: Secondary | ICD-10-CM | POA: Insufficient documentation

## 2022-11-29 DIAGNOSIS — Z79899 Other long term (current) drug therapy: Secondary | ICD-10-CM | POA: Insufficient documentation

## 2022-11-29 DIAGNOSIS — Z7901 Long term (current) use of anticoagulants: Secondary | ICD-10-CM | POA: Diagnosis not present

## 2022-11-29 DIAGNOSIS — Z951 Presence of aortocoronary bypass graft: Secondary | ICD-10-CM | POA: Diagnosis not present

## 2022-11-29 DIAGNOSIS — I4819 Other persistent atrial fibrillation: Secondary | ICD-10-CM | POA: Insufficient documentation

## 2022-11-29 DIAGNOSIS — D6869 Other thrombophilia: Secondary | ICD-10-CM | POA: Insufficient documentation

## 2022-11-29 DIAGNOSIS — I2510 Atherosclerotic heart disease of native coronary artery without angina pectoris: Secondary | ICD-10-CM | POA: Diagnosis not present

## 2022-11-29 DIAGNOSIS — Z48812 Encounter for surgical aftercare following surgery on the circulatory system: Secondary | ICD-10-CM | POA: Diagnosis not present

## 2022-11-29 DIAGNOSIS — E785 Hyperlipidemia, unspecified: Secondary | ICD-10-CM | POA: Insufficient documentation

## 2022-11-29 NOTE — Progress Notes (Signed)
Primary Care Physician: Hector Edwards, [place] Primary Cardiologist: Dr Hector Edwards  Primary Electrophysiologist: Dr Hector Edwards  Referring Physician: Dr Hector Edwards is a 77 y.o. male with a history of HTN, HLD, OSA, CAD, atrial fibrillation who presents for follow up in the [facility].  The patient was initially diagnosed with atrial fibrillation postoperatively following CABG. Coronary CTA 07/24/22 with severe disease in the LAD and RCA that appears to be flow limiting by CT FFR. Cardiac cath 08/01/22 with severe distal left main stenosis, severe disease in the ostial LAD and mid LAD, ostial Circumflex and in the proximal/distal RCA, PLA and PDA. He underwent CABG on 08/03/22. EP consulted 08/07/22 for rapid atrial fibrillation and atrial flutter, patient loaded on amiodarone and underwent TEE/DCCV on 08/08/22. He did have some paroxysms of afib after but was maintaining SR as he loaded on amiodarone. Patient is on Eliquis for a CHADS2VASC score of 3.  On follow up today, patient reports that he has done reasonably well. He continues to recover from his surgery. He has not had any symptoms of atrial fibrillation since discharge and is in SR today. No bleeding issues on anticoagulation.   F/u in afib clinic, 11/29/22. He is now 4 months out from CABG. He has not noted any heart rhythm irregularity. He feels his is back to his usual baseline prior to surgery. He wants to stop as many meds as he can. EKG shows SR.   Today, he denies symptoms of palpitations, chest pain, shortness of breath, orthopnea, PND, lower extremity edema, dizziness, presyncope, syncope, bleeding, or neurologic sequela. The patient is tolerating medications without difficulties and is otherwise without complaint today.    Atrial Fibrillation Risk Factors:  he does have symptoms or diagnosis of sleep apnea. he is not compliant with CPAP therapy. he does not have a history of  rheumatic fever.   he has a BMI of Body mass index is 34.47 kg/m.Marland Kitchen Filed Weights   08/22/22 1316  Weight: 128.5 kg    Family History  Problem Relation Age of Onset   Stroke Mother 25   Depression Mother    Hypertension Father    Heart disease Father    Colon polyps Father    Pancreatic cancer Brother    Colon polyps Brother    Throat cancer Brother    Breast cancer Daughter    Hypertension Other    Colon cancer Neg Hx    Esophageal cancer Neg Hx    Stomach cancer Neg Hx    Rectal cancer Neg Hx      Atrial Fibrillation Management history:  Previous antiarrhythmic drugs: amiodarone  Previous cardioversions: 08/08/22 Previous ablations: none CHADS2VASC score: 3 Anticoagulation history: Eliquis   Past Medical History:  Diagnosis Date   Depression    GERD (gastroesophageal reflux disease)    Hx of adenomatous colonic polyps 09/04/2015   Hyperlipidemia    Hypertension    LBP (low back pain)    OSA (obstructive sleep apnea) 08/01/2018   Sleep apnea    wears cpap    Past Surgical History:  Procedure Laterality Date   CARDIOVERSION N/A 08/08/2022   Procedure: CARDIOVERSION;  Surgeon: Hector Latch, MD;  Location: [place];  Service: Cardiovascular;  Laterality: N/A;   COLONOSCOPY  2004, 2021   hyperplastic polyps x 2   CORONARY ARTERY BYPASS GRAFT N/A 08/03/2022   Procedure: CORONARY ARTERY BYPASS GRAFTING (CABG) 3X, USING RIGHT GREATER SAPHENOUS VEIN AND LEFT  INTERNAL MAMMARY ARTERY.;  Surgeon: Hector Pollack, MD;  Location: [facility] OR;  Service: Open Heart Surgery;  Laterality: N/A;   KNEE ARTHROSCOPY Right    LEFT HEART CATH AND CORONARY ANGIOGRAPHY N/A 08/01/2022   Procedure: LEFT HEART CATH AND CORONARY ANGIOGRAPHY;  Surgeon: Hector Blanks, MD;  Location: [place] CV LAB;  Service: Cardiovascular;  Laterality: N/A;   PENILE PROSTHESIS IMPLANT N/A 01/25/2020   Procedure: PENILE PROTHESIS INFLATABLE COLOPLAST;  Surgeon: Hector Mallow, MD;   Location: [place];  Service: Urology;  Laterality: N/A;   SHOULDER ARTHROSCOPY W/ ROTATOR CUFF REPAIR Left    TEE WITHOUT CARDIOVERSION N/A 08/03/2022   Procedure: TRANSESOPHAGEAL ECHOCARDIOGRAM (TEE);  Surgeon: Hector Pollack, MD;  Location: [place];  Service: Open Heart Surgery;  Laterality: N/A;   TEE WITHOUT CARDIOVERSION N/A 08/08/2022   Procedure: TRANSESOPHAGEAL ECHOCARDIOGRAM (TEE);  Surgeon: Hector Latch, MD;  Location: [facility] ENDOSCOPY;  Service: Cardiovascular;  Laterality: N/A;   TONSILLECTOMY  as child   vocal cord growth removed  age 75   benign   WISDOM TOOTH EXTRACTION      Current Outpatient Medications  Medication Sig Dispense Refill   apixaban (ELIQUIS) 5 MG TABS tablet Take 1 tablet (5 mg total) by mouth 2 (two) times daily. 60 tablet 3   aspirin EC 81 MG tablet Take 1 tablet (81 mg total) by mouth daily. Swallow whole. 30 tablet 12   atorvastatin (LIPITOR) 80 MG tablet Take 1 tablet (80 mg total) by mouth daily. 30 tablet 3   benazepril (LOTENSIN) 20 MG tablet Take 1 tablet (20 mg total) by mouth at bedtime. 30 tablet 2   cefdinir (OMNICEF) 300 MG capsule Take 1 capsule (300 mg total) by mouth 2 (two) times daily. 14 capsule 0   diltiazem (CARDIZEM CD) 180 MG 24 hr capsule Take 1 capsule (180 mg total) by mouth daily. 30 capsule 1   metoprolol tartrate (LOPRESSOR) 25 MG tablet Take 1 tablet (25 mg total) by mouth 2 (two) times daily. 30 tablet 1   pantoprazole (PROTONIX) 40 MG tablet TAKE ONE TABLET BY MOUTH EVERY EVENING 30 tablet 0   [START ON 08/29/2022] amiodarone (PACERONE) 200 MG tablet Take 1 tablet (200 mg total) by mouth daily. 30 tablet 3   No current facility-administered medications for this encounter.    Allergies  Allergen Reactions   Hctz [Hydrochlorothiazide] Shortness Of Breath   Enalapril Maleate     cramps   Tribenzor [Olmesartan-Amlodipine-Hctz] Swelling    Fatigue, CP, edema   Verapamil     Fatigue    Social History    Socioeconomic History   Marital status: Divorced    Spouse name: Not on file   Number of children: 1   Years of education: Not on file   Highest education level: Not on file  Occupational History   Occupation: Retired   Occupation: Retired-mailman  Tobacco Use   Smoking status: Never   Smokeless tobacco: Never   Tobacco comments:    Never smoke 08/22/22  Vaping Use   Vaping Use: Never used  Substance and Sexual Activity   Alcohol use: Not Currently    Alcohol/week: 4.0 standard drinks of alcohol    Types: 4 Cans of beer per week    Comment: stop drinking 08/22/22   Drug use: No   Sexual activity: Not Currently  Other Topics Concern   Not on file  Social History Narrative   Regular exercise- yes, seldom; working physically  all the time.   Social Determinants of Health   Financial Resource Strain: Low Risk  (03/06/2022)   Overall Financial Resource Strain (CARDIA)    Difficulty of Paying Living Expenses: Not hard at all  Food Insecurity: No Food Insecurity (08/07/2022)   Hunger Vital Sign    Worried About Running Out of Food in the Last Year: Never true    Ran Out of Food in the Last Year: Never true  Transportation Needs: No Transportation Needs (08/07/2022)   PRAPARE - Hydrologist (Medical): No    Lack of Transportation (Non-Medical): No  Physical Activity: Inactive (03/06/2022)   Exercise Vital Sign    Days of Exercise per Week: 0 days    Minutes of Exercise per Session: 0 min  Stress: No Stress Concern Present (03/06/2022)   [place]    Feeling of Stress : Not at all  Social Connections: Moderately Integrated (03/06/2022)   Social Connection and Isolation Panel [NHANES]    Frequency of Communication with Friends and Family: More than three times a week    Frequency of Social Gatherings with Friends and Family: More than three times a week    Attends Religious  Services: 1 to 4 times per year    Active Member of [employer] or Organizations: Yes    Attends Archivist Meetings: 1 to 4 times per year    Marital Status: Divorced  Human resources officer Violence: Not At Risk (08/07/2022)   Humiliation, Afraid, Rape, and Kick questionnaire    Fear of Current or Ex-Partner: No    Emotionally Abused: No    Physically Abused: No    Sexually Abused: No     ROS- All systems are reviewed and negative except as per the HPI above.  Physical Exam: Vitals:   08/22/22 1316  BP: 118/72  Pulse: 71  Weight: 128.5 kg  Height: 6' 4"$  (1.93 m)    GEN- The patient is a well appearing obese male, alert and oriented x 3 today.   Head- normocephalic, atraumatic Eyes-  Sclera clear, conjunctiva pink Ears- hearing intact Oropharynx- clear Neck- supple  Lungs- Clear to ausculation bilaterally, normal work of breathing Heart- Regular rate and rhythm, no murmurs, rubs or gallops  GI- soft, NT, ND, + BS Extremities- no clubbing, cyanosis, or edema MS- no significant deformity or atrophy Skin- no rash or lesion Psych- euthymic mood, full affect Neuro- strength and sensation are intact  Wt Readings from Last 3 Encounters:  08/22/22 128.5 kg  08/18/22 126.7 kg  07/27/22 (!) 138.2 kg    EKG -Vent. rate 73 BPM PR interval 220 ms QRS duration 120 ms QT/QTcB 420/462 ms P-R-T axes 55 28 6 Sinus rhythm with 1st degree A-V block Non-specific intra-ventricular conduction delay Minimal voltage criteria for LVH, may be normal variant ( Cornell product ) Nonspecific ST abnormality Abnormal ECG When compared with ECG of 22-Aug-2022 13:27, PREVIOUS ECG IS PRESENT  Echo 08/13/22 demonstrated   1. Technically difficult study. Left ventricular ejection fraction, by  estimation, is 60 to 65%. The left ventricle has normal function. The left  ventricle has no regional wall motion abnormalities. There is moderate  left ventricular hypertrophy. Left ventricular  diastolic parameters are indeterminate.   2. Right ventricular systolic function was not well visualized. The right  ventricular size is not well visualized.   3. A small pericardial effusion is present.   4. The mitral valve is normal  in structure. No evidence of mitral valve  regurgitation.   5. The aortic valve was not well visualized. Aortic valve regurgitation  is not visualized.   Epic records are reviewed at length today  CHA2DS2-VASc Score = 3  The patient's score is based upon: CHF History: 0 HTN History: 1 Diabetes History: 0 Stroke History: 0 Vascular Disease History: 1 Age Score: 1 Gender Score: 0       ASSESSMENT AND PLAN: 1. Persistent Atrial Fibrillation (ICD10:  I48.19) The patient's CHA2DS2-VASc score is 3, indicating a 3.2% annual risk of stroke.   Diagnosed postoperatively  S/p TEE/DCCV 08/08/22 Patient in [place] today.  He wants to stop a many drugs as possible Will stop amiodarone today Continue diltiazem 120 mg qd  Will place a 2 week zio patch and if no Afib can probably stop eliquis  Continue Eliquis 5 mg BID for now   2. Secondary Hypercoagulable State (ICD10:  D68.69) The patient is at significant risk for stroke/thromboembolism based upon his CHA2DS2-VASc Score of 3.  Continue Apixaban (Eliquis).   3. Obesity Body mass index is 34.47 kg/m. Lifestyle modification was discussed at length including regular exercise and weight reduction.  4. CAD S/p CABG No anginal symptoms Continue ASA, BB/ACE/STATIN  6. HTN Stable, no changes today.   F/u with Dr. Angelena Edwards 4/8  Hector Edwards. Hector Edwards, [facility] [address] [place], [place] [address] [phone]  1:52 PM

## 2022-11-29 NOTE — Patient Instructions (Signed)
Stop amiodarone

## 2022-12-03 DIAGNOSIS — Z48812 Encounter for surgical aftercare following surgery on the circulatory system: Secondary | ICD-10-CM | POA: Diagnosis not present

## 2022-12-03 DIAGNOSIS — Z951 Presence of aortocoronary bypass graft: Secondary | ICD-10-CM | POA: Diagnosis not present

## 2022-12-07 DIAGNOSIS — M1711 Unilateral primary osteoarthritis, right knee: Secondary | ICD-10-CM | POA: Diagnosis not present

## 2022-12-10 DIAGNOSIS — Z951 Presence of aortocoronary bypass graft: Secondary | ICD-10-CM | POA: Diagnosis not present

## 2022-12-12 DIAGNOSIS — Z951 Presence of aortocoronary bypass graft: Secondary | ICD-10-CM | POA: Diagnosis not present

## 2022-12-13 DIAGNOSIS — Z951 Presence of aortocoronary bypass graft: Secondary | ICD-10-CM | POA: Diagnosis not present

## 2022-12-17 DIAGNOSIS — Z951 Presence of aortocoronary bypass graft: Secondary | ICD-10-CM | POA: Diagnosis not present

## 2022-12-19 ENCOUNTER — Telehealth: Payer: Self-pay | Admitting: Cardiovascular Disease

## 2022-12-19 DIAGNOSIS — Z951 Presence of aortocoronary bypass graft: Secondary | ICD-10-CM | POA: Diagnosis not present

## 2022-12-19 NOTE — Telephone Encounter (Signed)
The pt's sister [name] called stating that DR. Ulice Brilliant, DDS never received any notice on if it was okay to continue with the dental extraction. [name] stated that they need paperwork showing that the doctor approved it and have it faxed to [phone].

## 2022-12-19 NOTE — Telephone Encounter (Signed)
Clearance was faxed to DDS office on 11/20/22 by Ambrose Pancoast, NP at 11:07 am. I will re-fax notes today.

## 2022-12-19 NOTE — Telephone Encounter (Signed)
Pt's daughter called and said DDS did not get the clearance notes. See notes below.     Clearance was faxed to DDS office on 11/20/22 by Ambrose Pancoast, NP at 11:07 am. I will re-fax notes today.

## 2022-12-20 DIAGNOSIS — Z951 Presence of aortocoronary bypass graft: Secondary | ICD-10-CM | POA: Diagnosis not present

## 2022-12-22 DIAGNOSIS — I480 Paroxysmal atrial fibrillation: Secondary | ICD-10-CM | POA: Diagnosis not present

## 2022-12-24 DIAGNOSIS — Z951 Presence of aortocoronary bypass graft: Secondary | ICD-10-CM | POA: Diagnosis not present

## 2022-12-24 NOTE — Addendum Note (Signed)
Encounter addended by: Juluis Mire, RN on: 12/24/2022 1:37 PM  Actions taken: Imaging Exam ended

## 2022-12-26 DIAGNOSIS — Z951 Presence of aortocoronary bypass graft: Secondary | ICD-10-CM | POA: Diagnosis not present

## 2022-12-27 DIAGNOSIS — Z951 Presence of aortocoronary bypass graft: Secondary | ICD-10-CM | POA: Diagnosis not present

## 2022-12-31 DIAGNOSIS — Z951 Presence of aortocoronary bypass graft: Secondary | ICD-10-CM | POA: Diagnosis not present

## 2023-01-01 ENCOUNTER — Other Ambulatory Visit: Payer: Self-pay | Admitting: Physician Assistant

## 2023-01-01 ENCOUNTER — Other Ambulatory Visit: Payer: Self-pay | Admitting: Family

## 2023-01-02 DIAGNOSIS — Z951 Presence of aortocoronary bypass graft: Secondary | ICD-10-CM | POA: Diagnosis not present

## 2023-01-04 ENCOUNTER — Telehealth: Payer: Self-pay | Admitting: Cardiovascular Disease

## 2023-01-04 ENCOUNTER — Other Ambulatory Visit: Payer: Self-pay | Admitting: Physician Assistant

## 2023-01-04 ENCOUNTER — Other Ambulatory Visit: Payer: Self-pay | Admitting: Family

## 2023-01-04 DIAGNOSIS — I4819 Other persistent atrial fibrillation: Secondary | ICD-10-CM

## 2023-01-04 MED ORDER — APIXABAN 5 MG PO TABS: 5.0000 mg | ORAL_TABLET | Freq: Two times a day (BID) | ORAL | 0 refills | Status: DC

## 2023-01-04 NOTE — Telephone Encounter (Signed)
Prescription refill request for Eliquis received. Indication: a fib Last office visit: 11/29/22 Scr: 0.94 08/17/22 epic Age: 77 Weight: 290

## 2023-01-04 NOTE — Telephone Encounter (Signed)
*  STAT* If patient is at the pharmacy, call can be transferred to refill team.   1. Which medications need to be refilled? (please list name of each medication and dose if known)   apixaban (ELIQUIS) 5 MG TABS tablet   2. Which pharmacy/location (including street and city if local pharmacy) is medication to be sent to?  [facility] - [place], [place] - [address]   3. Do they need a 30 day or 90 day supply?   90 day   Caller stated patient is completely out of this medication.

## 2023-01-07 DIAGNOSIS — Z951 Presence of aortocoronary bypass graft: Secondary | ICD-10-CM | POA: Diagnosis not present

## 2023-01-09 DIAGNOSIS — Z951 Presence of aortocoronary bypass graft: Secondary | ICD-10-CM | POA: Diagnosis not present

## 2023-01-10 ENCOUNTER — Encounter (HOSPITAL_COMMUNITY): Payer: Self-pay | Admitting: *Deleted

## 2023-01-10 DIAGNOSIS — Z951 Presence of aortocoronary bypass graft: Secondary | ICD-10-CM | POA: Diagnosis not present

## 2023-01-13 NOTE — Progress Notes (Unsigned)
No chief complaint on file.  History of Present Illness: 77 yo male with history of depression, GERD, obesity, HTN, HLD and sleep apnea who is here today for follow up. I saw him as a new consult for the evaluation of dyspnea on exertion in September 2023. He was seen in 2017 by Dr. Eldridge Dace. Echo July 2017 with LV function, no valve disease. Nuclear stress test in 2017 with no ischemia. Echo September 2020 with normal LV function and no valvular disease. At his visit here in September 2023 he described progressive dyspnea and fatigue but no chest pain. He has sleep apnea but does not tolerate CPAP. Echo 07/05/22 with LVEF=60-65%, moderate LVH. Mild aortic stenosis with mean gradient of 12 mmHg. Coronary CTA 07/24/22 with severe disease in the LAD and RCA that appears to be flow limiting by CT FFR. Cardiac catheterization October 2023 with severe left main and three vessel CAD. He underwent 3V CABG in Octoberr 2023. (LIMA to LAD, SVG to PDA, SVG to posterolateral branch).  Post-op course complicated by A Fib RVR  that persisted despite cardizem and digoxin. He underwent cardioversion on 08/08/2022.  Follow-up CXR showed prominent central pulmonary vessels suggesting CHF and increased small to moderate bilateral pleural effusions which were treated with 1 dose of IV diuretic and he was restarted on daily oral diuretic. Echo prior to discharge in November 2023 with LVEF=60-65%. He was seen in atrial fib clinic following discharge and was continued on Eliquis and amiodarone. Cardiac monitor March 2024 with no evidence of atrial fib. Amiodarone was stopped. ? Eliquis  He is here today for follow up. The patient denies any chest pain, dyspnea, palpitations, lower extremity edema, orthopnea, PND, dizziness, near syncope or syncope.   Primary Care Physician: Olive Bass, FNP   Past Medical History:  Diagnosis Date   Depression    GERD (gastroesophageal reflux disease)    Hx of adenomatous colonic  polyps 09/04/2015   Hyperlipidemia    Hypertension    LBP (low back pain)    OSA (obstructive sleep apnea) 08/01/2018   Sleep apnea    wears cpap     Past Surgical History:  Procedure Laterality Date   CARDIOVERSION N/A 08/08/2022   Procedure: CARDIOVERSION;  Surgeon: Chilton Si, MD;  Location: [facility] ENDOSCOPY;  Service: Cardiovascular;  Laterality: N/A;   COLONOSCOPY  2004, 2021   hyperplastic polyps x 2   CORONARY ARTERY BYPASS GRAFT N/A 08/03/2022   Procedure: CORONARY ARTERY BYPASS GRAFTING (CABG) 3X, USING RIGHT GREATER SAPHENOUS VEIN AND LEFT INTERNAL MAMMARY ARTERY.;  Surgeon: Alleen Borne, MD;  Location: [facility] OR;  Service: Open Heart Surgery;  Laterality: N/A;   KNEE ARTHROSCOPY Right    LEFT HEART CATH AND CORONARY ANGIOGRAPHY N/A 08/01/2022   Procedure: LEFT HEART CATH AND CORONARY ANGIOGRAPHY;  Surgeon: Kathleene Hazel, MD;  Location: [facility] INVASIVE CV LAB;  Service: Cardiovascular;  Laterality: N/A;   PENILE PROSTHESIS IMPLANT N/A 01/25/2020   Procedure: PENILE PROTHESIS INFLATABLE COLOPLAST;  Surgeon: Crista Elliot, MD;  Location: [facility];  Service: Urology;  Laterality: N/A;   SHOULDER ARTHROSCOPY W/ ROTATOR CUFF REPAIR Left    TEE WITHOUT CARDIOVERSION N/A 08/03/2022   Procedure: TRANSESOPHAGEAL ECHOCARDIOGRAM (TEE);  Surgeon: Alleen Borne, MD;  Location[facility] OR;  Service: Open Heart Surgery;  Laterality: N/A;   TEE WITHOUT CARDIOVERSION N/A 08/08/2022   Procedure: TRANSESOPHAGEAL ECHOCARDIOGRAM (TEE);  Surgeon: Chilton Si, MD;  Location: [facility] ENDOSCOPY;  Service: Cardiovascular;  Laterality: N/A;   TONSILLECTOMY  as child   vocal cord growth removed  age 89   benign   WISDOM TOOTH EXTRACTION      Current Outpatient Medications  Medication Sig Dispense Refill   alfuzosin (UROXATRAL) 10 MG 24 hr tablet Take 1 tablet by mouth once daily with breakfast 30 tablet 0   apixaban (ELIQUIS) 5 MG TABS tablet Take 1 tablet (5 mg total) by  mouth 2 (two) times daily. 180 tablet 0   aspirin EC 81 MG tablet Take 1 tablet (81 mg total) by mouth daily. Swallow whole. 30 tablet 12   atorvastatin (LIPITOR) 80 MG tablet Take 1 tablet (80 mg total) by mouth daily. 90 tablet 3   benazepril (LOTENSIN) 20 MG tablet Take 1 tablet (20 mg total) by mouth at bedtime. 90 tablet 3   diltiazem (CARDIZEM CD) 180 MG 24 hr capsule Take 1 capsule (180 mg total) by mouth daily. 90 capsule 3   metoprolol tartrate (LOPRESSOR) 25 MG tablet Take 1 tablet (25 mg total) by mouth 2 (two) times daily. 180 tablet 3   pantoprazole (PROTONIX) 40 MG tablet Take 1 tablet (40 mg total) by mouth every evening. 90 tablet 0   No current facility-administered medications for this visit.    Allergies  Allergen Reactions   Hctz [Hydrochlorothiazide] Shortness Of Breath   Enalapril Maleate     cramps   Tribenzor [Olmesartan-Amlodipine-Hctz] Swelling    Fatigue, CP, edema   Verapamil     Fatigue    Social History   Socioeconomic History   Marital status: Divorced    Spouse name: Not on file   Number of children: 1   Years of education: Not on file   Highest education level: Not on file  Occupational History   Occupation: Retired   Occupation: Retired-mailman  Tobacco Use   Smoking status: Never   Smokeless tobacco: Never   Tobacco comments:    Never smoke 08/22/22  Vaping Use   Vaping Use: Never used  Substance and Sexual Activity   Alcohol use: Not Currently    Alcohol/week: 4.0 standard drinks of alcohol    Types: 4 Cans of beer per week    Comment: stop drinking 08/22/22   Drug use: No   Sexual activity: Not Currently  Other Topics Concern   Not on file  Social History Narrative   Regular exercise- yes, seldom; working physically all the time.   Social Determinants of Health   Financial Resource Strain: Low Risk  (03/06/2022)   Overall Financial Resource Strain (CARDIA)    Difficulty of Paying Living Expenses: Not hard at all  Food  Insecurity: No Food Insecurity (08/07/2022)   Hunger Vital Sign    Worried About Running Out of Food in the Last Year: Never true    Ran Out of Food in the Last Year: Never true  Transportation Needs: No Transportation Needs (08/07/2022)   PRAPARE - Administrator, Civil ServiceTransportation    Lack of Transportation (Medical): No    Lack of Transportation (Non-Medical): No  Physical Activity: Inactive (03/06/2022)   Exercise Vital Sign    Days of Exercise per Week: 0 days    Minutes of Exercise per Session: 0 min  Stress: No Stress Concern Present (03/06/2022)   [employer] - Occupational Stress Questionnaire    Feeling of Stress : Not at all  Social Connections: Moderately Integrated (03/06/2022)   Social Connection and Isolation Panel [NHANES]    Frequency of Communication with  Friends and Family: More than three times a week    Frequency of Social Gatherings with Friends and Family: More than three times a week    Attends Religious Services: 1 to 4 times per year    Active Member of [employer] or Organizations: Yes    Attends Banker Meetings: 1 to 4 times per year    Marital Status: Divorced  Catering manager Violence: Not At Risk (08/07/2022)   Humiliation, Afraid, Rape, and Kick questionnaire    Fear of Current or Ex-Partner: No    Emotionally Abused: No    Physically Abused: No    Sexually Abused: No    Family History  Problem Relation Age of Onset   Stroke Mother 36   Depression Mother    Hypertension Father    Heart disease Father    Colon polyps Father    Pancreatic cancer Brother    Colon polyps Brother    Throat cancer Brother    Breast cancer Daughter    Hypertension Other    Colon cancer Neg Hx    Esophageal cancer Neg Hx    Stomach cancer Neg Hx    Rectal cancer Neg Hx     Review of Systems:  As stated in the HPI and otherwise negative.   There were no vitals taken for this visit.  Physical Examination: General: Well developed, well  nourished, NAD  HEENT: OP clear, mucus membranes moist  SKIN: warm, dry. No rashes. Neuro: No focal deficits  Musculoskeletal: Muscle strength 5/5 all ext  Psychiatric: Mood and affect normal  Neck: No JVD, no carotid bruits, no thyromegaly, no lymphadenopathy.  Lungs:Clear bilaterally, no wheezes, rhonci, crackles Cardiovascular: Regular rate and rhythm. No murmurs, gallops or rubs. Abdomen:Soft. Bowel sounds present. Non-tender.  Extremities: No lower extremity edema. Pulses are 2 + in the bilateral DP/PT.  EKG:  EKG is *** ordered today. The ekg ordered today demonstrates  Recent Labs: 06/05/2022: ALT 35; TSH 2.37 08/07/2022: Magnesium 2.2 08/17/2022: BUN 18; Creatinine, Ser 0.94; Potassium 4.0; Sodium 134 09/04/2022: Hemoglobin 12.6; Platelets 115.0   Lipid Panel    Component Value Date/Time   CHOL 88 08/07/2022 0115   CHOL 170 06/16/2019 1035   TRIG 107 08/07/2022 0115   HDL 16 (L) 08/07/2022 0115   HDL 32 (L) 06/16/2019 1035   CHOLHDL 5.5 08/07/2022 0115   VLDL 21 08/07/2022 0115   LDLCALC 51 08/07/2022 0115   LDLCALC 121 (H) 06/16/2019 1035   LDLDIRECT 158.5 10/19/2008 0858     Wt Readings from Last 3 Encounters:  11/29/22 132.4 kg  09/05/22 131.5 kg  08/28/22 130 kg    Assessment and Plan:   1. CAD s/p CABG without angina: No chest pain suggestive of angina. Continue ASA, statin and beta blocker.   2. HTN: BP is controlled. No changes today  3. Hyperlipidemia: LDL at goal in October 2023. Continue statin.   4. Atrial fibrillation, paroxysmal: Sinus today. Cardiac monitor in March 2024 with sinus. His atrial fib occurred post-op. No recurrence over last six months. *** *** Will stop Eliquis.   Labs/ tests ordered today include:  No orders of the defined types were placed in this encounter.  Disposition:   F/U with me in one year  Signed, Verne Carrow, MD 01/13/2023 12:14 PM    [facility] [address] [place], [place], [place]   [address] Phone: [phone]; Fax: [phone]

## 2023-01-14 ENCOUNTER — Encounter: Payer: Self-pay | Admitting: Cardiovascular Disease

## 2023-01-14 ENCOUNTER — Ambulatory Visit: Payer: Medicare HMO | Attending: Cardiovascular Disease | Admitting: Cardiovascular Disease

## 2023-01-14 VITALS — BP 134/86 | HR 82 | Ht 76.0 in | Wt 289.8 lb

## 2023-01-14 DIAGNOSIS — I480 Paroxysmal atrial fibrillation: Secondary | ICD-10-CM

## 2023-01-14 DIAGNOSIS — E7800 Pure hypercholesterolemia, unspecified: Secondary | ICD-10-CM | POA: Diagnosis not present

## 2023-01-14 DIAGNOSIS — I10 Essential (primary) hypertension: Secondary | ICD-10-CM | POA: Diagnosis not present

## 2023-01-14 DIAGNOSIS — I2510 Atherosclerotic heart disease of native coronary artery without angina pectoris: Secondary | ICD-10-CM | POA: Diagnosis not present

## 2023-01-14 NOTE — Patient Instructions (Signed)
Medication Instructions:  Your physician has recommended you make the following change in your medication:  1.) stop Eliquis  *If you need a refill on your cardiac medications before your next appointment, please call your pharmacy*   Lab Work: none   Testing/Procedures: none   Follow-Up: At [facility], you and your health needs are our priority.  As part of our continuing mission to provide you with exceptional heart care, we have created designated Provider Care Teams.  These Care Teams include your primary Cardiologist (physician) and Advanced Practice Providers (APPs -  Physician Assistants and Nurse Practitioners) who all work together to provide you with the care you need, when you need it.   Your next appointment:   6 month(s)  Provider:   Verne Carrow, MD

## 2023-01-16 DIAGNOSIS — Z951 Presence of aortocoronary bypass graft: Secondary | ICD-10-CM | POA: Diagnosis not present

## 2023-01-17 ENCOUNTER — Other Ambulatory Visit: Payer: Self-pay | Admitting: Family

## 2023-01-17 DIAGNOSIS — K219 Gastro-esophageal reflux disease without esophagitis: Secondary | ICD-10-CM

## 2023-01-17 DIAGNOSIS — Z951 Presence of aortocoronary bypass graft: Secondary | ICD-10-CM | POA: Diagnosis not present

## 2023-01-22 ENCOUNTER — Telehealth: Payer: Self-pay | Admitting: Family

## 2023-01-22 NOTE — Telephone Encounter (Signed)
Called patient to schedule Medicare Annual Wellness Visit (AWV). Left message for patient to call back and schedule Medicare Annual Wellness Visit (AWV).  Last date of AWV: 03/06/2022   Please schedule an AWVS appointment at any time with [facility] ANNUAL WELLNESS VISIT.  If any questions, please contact me at [phone].    Thank you,  [facility] Support [facility] Direct dial  [phone]

## 2023-01-26 ENCOUNTER — Other Ambulatory Visit: Payer: Self-pay | Admitting: Physician Assistant

## 2023-01-28 ENCOUNTER — Other Ambulatory Visit: Payer: Self-pay

## 2023-01-28 MED ORDER — ATORVASTATIN CALCIUM 80 MG PO TABS: 80.0000 mg | ORAL_TABLET | Freq: Every day | ORAL | 3 refills | Status: DC

## 2023-01-29 ENCOUNTER — Other Ambulatory Visit: Payer: Self-pay

## 2023-01-29 ENCOUNTER — Other Ambulatory Visit: Payer: Self-pay | Admitting: Family

## 2023-01-29 ENCOUNTER — Telehealth: Payer: Self-pay | Admitting: Family

## 2023-01-29 DIAGNOSIS — K219 Gastro-esophageal reflux disease without esophagitis: Secondary | ICD-10-CM

## 2023-01-29 MED ORDER — ASPIRIN 81 MG PO TBEC: 81.0000 mg | DELAYED_RELEASE_TABLET | Freq: Every day | ORAL | 11 refills | Status: DC

## 2023-01-29 MED ORDER — PANTOPRAZOLE SODIUM 40 MG PO TBEC: 40.0000 mg | DELAYED_RELEASE_TABLET | Freq: Every evening | ORAL | 3 refills | Status: DC

## 2023-01-29 MED ORDER — ALFUZOSIN HCL ER 10 MG PO TB24: 10.0000 mg | ORAL_TABLET | Freq: Every day | ORAL | 3 refills | Status: DC

## 2023-01-29 MED ORDER — BENAZEPRIL HCL 20 MG PO TABS: 20.0000 mg | ORAL_TABLET | Freq: Every day | ORAL | 3 refills | Status: DC

## 2023-01-29 MED ORDER — METOPROLOL TARTRATE 25 MG PO TABS: 25.0000 mg | ORAL_TABLET | Freq: Two times a day (BID) | ORAL | 3 refills | Status: DC

## 2023-01-29 MED ORDER — DILTIAZEM HCL ER COATED BEADS 180 MG PO CP24: 180.0000 mg | ORAL_CAPSULE | Freq: Every day | ORAL | 3 refills | Status: DC

## 2023-01-29 MED ORDER — ATORVASTATIN CALCIUM 80 MG PO TABS: 80.0000 mg | ORAL_TABLET | Freq: Every day | ORAL | 3 refills | Status: DC

## 2023-01-29 NOTE — Telephone Encounter (Signed)
Prescription Request  01/29/2023  Is this a "Controlled Substance" medicine? No  LOV: 08/28/2022  What is the name of the medication or equipment?  1.pantoprazole (PROTONIX) 40 MG tablet  2.alfuzosin (UROXATRAL) 10 MG 24 hr tablet  3.aspirin EC 81 MG tablet   Have you contacted your pharmacy to request a refill? Yes   Which pharmacy would you like this sent to?  [facility] - [place], [place] - [address] [place] [address] Phone: [phone] Fax: [phone]   Patient notified that their request is being sent to the clinical staff for review and that they should receive a response within 2 business days.   Please advise at [facility] [phone]

## 2023-01-29 NOTE — Telephone Encounter (Signed)
Pt's medications were sent to pt's pharmacy as requested. Confirmation received.  

## 2023-02-18 ENCOUNTER — Other Ambulatory Visit: Payer: Self-pay | Admitting: Pharmacist

## 2023-02-18 NOTE — Progress Notes (Signed)
Pharmacy Quality Measure Review  This patient is appearing on the insurance-provided list for being at risk of failing the adherence measure for cholesterol medications this calendar year. Also noted that he is due to have benazepril filled as well.   Medication: atorvastatin  Last fill date: 12/28/2022 for 30 days (there was an Rx sent in 01/28/2023 for 90 days but per [facility] this Rx was never picked up)  Benazepril was last filled 11/25/2022 for 90 days. Refill should be due later this week.   I looks like patient has used [facility] in the past so I wonder if he needs a pharmacy that can deliver to him or if he would benefit from packaging.   I tried to contact patient regarding atorvastatin and benazepril RFs, to assess adherence and discuss important of ACE-I / hypertension control and statin therapy. I was not able to reach patient today but LM on VM with CB# [phone] or [phone].    Henrene Pastor, PharmD Clinical Pharmacist [facility]

## 2023-02-20 ENCOUNTER — Other Ambulatory Visit: Payer: Self-pay | Admitting: Family

## 2023-02-20 DIAGNOSIS — K219 Gastro-esophageal reflux disease without esophagitis: Secondary | ICD-10-CM

## 2023-03-01 ENCOUNTER — Telehealth: Payer: Self-pay | Admitting: Pharmacist

## 2023-03-01 NOTE — Telephone Encounter (Signed)
Pharmacy Quality Measure Review  This patient is appearing on the insurance-provided list for being at risk of failing the adherence measure for cholesterol and hypertension (ACEi/ARB) medications this calendar year.   Reviewed refill history and looks like both medications were filled recently by [facility] 02/25/2023  Called [facility] to confirm medications were delivered 02/25/2023  Tried to contact patient to review medication list and discuss adherence - unable to reach patient - LM on VM.   Last OV with Ria Clock was 08/2022.

## 2023-03-21 ENCOUNTER — Other Ambulatory Visit: Payer: Self-pay | Admitting: Physician Assistant

## 2023-03-25 ENCOUNTER — Other Ambulatory Visit: Payer: Self-pay | Admitting: Nurse Practitioner

## 2023-04-12 ENCOUNTER — Telehealth: Payer: Self-pay

## 2023-04-12 NOTE — Telephone Encounter (Signed)
Please ask patient if they have picked up the metoprolol Tartrate 25 MG from the pharmacy if not why?

## 2023-04-12 NOTE — Telephone Encounter (Signed)
Called patient LMTCB on their answering machine.

## 2023-04-29 ENCOUNTER — Telehealth: Payer: Self-pay | Admitting: Family

## 2023-04-29 NOTE — Telephone Encounter (Signed)
Copied from CRM [id number]. Topic: Medicare AWV >> Apr 29, 2023  1:51 PM Payton Doughty wrote: Reason for CRM: LM 04/29/2023 to schedule AWV   Verlee Rossetti; Care Guide Ambulatory Clinical Support [place] [facility] Direct Dial: [phone]

## 2023-06-04 ENCOUNTER — Telehealth: Payer: Self-pay | Admitting: Pharmacist

## 2023-06-04 NOTE — Telephone Encounter (Signed)
Following patient for medication adherence.  LR for atorvastatin and benazepril 05/28/2023 for 30 DS.   Patient has been using [facility] as pharmacy and possibly their adherence packaging too. [facility] is closing. Tried to contact patient to discuss options for other pharmacies that could package and deliver his medications and to assist with transition.  Unable to reach patient LM on VM with CB# [phone] or [phone]

## 2023-06-20 NOTE — Telephone Encounter (Signed)
I was able to reach his sister Rowan Blase. She actually is the person that helps him with his medications. She was planning to help patient will filling containers but she would like to continue to get packaged.  We discussed some local pharmacy options.  Mrs Francesca Oman decided keeping packaging similar to [facility] was best option. We will plan to change to [facility] adherence packaging with [facility] pharmacy.  Mrs. Francesca Oman will send me a picture thru email or MyChart of his med list from his packaging so that we can set up the same as his current meds.  She reports he has enough through around 06/29/2023 but will check to get exact date and send to me.

## 2023-06-20 NOTE — Telephone Encounter (Signed)
Tried to reach patient again about medications and packaging. No Answer but LM on VM.  I was able to reach his sister

## 2023-06-26 ENCOUNTER — Telehealth: Payer: Self-pay | Admitting: Pharmacist

## 2023-06-26 ENCOUNTER — Other Ambulatory Visit (HOSPITAL_COMMUNITY): Payer: Self-pay

## 2023-06-26 DIAGNOSIS — K219 Gastro-esophageal reflux disease without esophagitis: Secondary | ICD-10-CM

## 2023-06-26 MED ORDER — PANTOPRAZOLE SODIUM 40 MG PO TBEC
40.0000 mg | DELAYED_RELEASE_TABLET | Freq: Every evening | ORAL | 1 refills | Status: DC
Start: 2023-06-26 — End: 2023-08-14
  Filled 2023-06-26 – 2023-06-28 (×3): qty 30, 30d supply, fill #0
  Filled 2023-07-22: qty 30, 30d supply, fill #1

## 2023-06-26 MED ORDER — BENAZEPRIL HCL 20 MG PO TABS
20.0000 mg | ORAL_TABLET | Freq: Every day | ORAL | 1 refills | Status: DC
  Filled 2023-06-26 – 2023-06-28 (×3): qty 30, 30d supply, fill #0
  Filled 2023-07-22: qty 30, 30d supply, fill #1

## 2023-06-26 MED ORDER — ASPIRIN 81 MG PO TBEC
81.0000 mg | DELAYED_RELEASE_TABLET | Freq: Every day | ORAL | 11 refills | Status: DC
  Filled 2023-06-26 – 2023-06-28 (×3): qty 30, 30d supply, fill #0
  Filled 2023-07-22: qty 30, 30d supply, fill #1
  Filled 2023-08-14: qty 30, 30d supply, fill #2
  Filled 2023-09-18 – 2023-09-19 (×2): qty 30, 30d supply, fill #3
  Filled 2023-10-07 – 2023-10-15 (×2): qty 30, 30d supply, fill #4
  Filled 2023-11-04 – 2023-11-07 (×2): qty 30, 30d supply, fill #5
  Filled 2023-11-21 – 2023-12-03 (×2): qty 30, 30d supply, fill #6
  Filled 2023-12-12 – 2023-12-26 (×2): qty 30, 30d supply, fill #7
  Filled 2024-01-21: qty 30, 30d supply, fill #8
  Filled 2024-02-03 – 2024-02-19 (×3): qty 30, 30d supply, fill #9
  Filled 2024-03-09 – 2024-04-09 (×2): qty 30, 30d supply, fill #10
  Filled 2024-05-12: qty 30, 30d supply, fill #11

## 2023-06-26 MED ORDER — ALFUZOSIN HCL ER 10 MG PO TB24
10.0000 mg | ORAL_TABLET | Freq: Every day | ORAL | 1 refills | Status: DC
  Filled 2023-06-26 – 2023-06-28 (×3): qty 30, 30d supply, fill #0
  Filled 2023-07-22: qty 30, 30d supply, fill #1

## 2023-06-26 MED ORDER — DILTIAZEM HCL ER COATED BEADS 180 MG PO CP24
180.0000 mg | ORAL_CAPSULE | ORAL | 1 refills | Status: DC
  Filled 2023-06-26 – 2023-06-28 (×3): qty 30, 30d supply, fill #0
  Filled 2023-07-22: qty 30, 30d supply, fill #1

## 2023-06-26 MED ORDER — ATORVASTATIN CALCIUM 80 MG PO TABS
80.0000 mg | ORAL_TABLET | Freq: Every day | ORAL | 1 refills | Status: DC
  Filled 2023-06-26 – 2023-06-28 (×3): qty 30, 30d supply, fill #0
  Filled 2023-07-22: qty 30, 30d supply, fill #1

## 2023-06-26 MED ORDER — METOPROLOL TARTRATE 25 MG PO TABS
25.0000 mg | ORAL_TABLET | Freq: Two times a day (BID) | ORAL | 1 refills | Status: DC
  Filled 2023-06-26 – 2023-06-28 (×3): qty 60, 30d supply, fill #0
  Filled 2023-07-22: qty 60, 30d supply, fill #1

## 2023-06-26 NOTE — Telephone Encounter (Signed)
Patients sister call regarding adherence packaging. They previously used [facility] but since [facility] has closed they would like to change to getting all meds in adherence packaging thru [facility].  Previous med schedule AM / Breakfast: Alfuzosin Er 10mg  Aspirin 81mg   Atorvastatin 80mg   Diltiazem Er 180mg   Metoprolol 25mg   PM / Dinner:  Benazepril 20mg  Metoprolol 25mg   Pantoprazole 40mg    Sent Rxs for [employer] for all maintenance meds for 30 ds + 1 RF. Patient to see cardiologist in October 2024 per recall notes.   Send email to [employer] and [employer] Shipper with packaging team to start process.  Patient's sister provided with phone number for [facility] to provide payment.

## 2023-06-27 ENCOUNTER — Other Ambulatory Visit: Payer: Self-pay

## 2023-06-27 ENCOUNTER — Other Ambulatory Visit (HOSPITAL_COMMUNITY): Payer: Self-pay

## 2023-06-28 ENCOUNTER — Other Ambulatory Visit (HOSPITAL_COMMUNITY): Payer: Self-pay

## 2023-06-28 ENCOUNTER — Other Ambulatory Visit: Payer: Self-pay

## 2023-07-01 ENCOUNTER — Other Ambulatory Visit (HOSPITAL_COMMUNITY): Payer: Self-pay

## 2023-07-02 ENCOUNTER — Telehealth: Payer: Self-pay | Admitting: Pharmacist

## 2023-07-02 NOTE — Telephone Encounter (Signed)
Called to verify that patient received his adherence packaged meds in that mail. He did receive them Monday morning 9/23 - and start them that morning.  Plan to follow to see how he is doing and see if any changes are needed in about 2 to 3 weeks.

## 2023-07-04 ENCOUNTER — Other Ambulatory Visit: Payer: Self-pay

## 2023-07-08 DIAGNOSIS — M1711 Unilateral primary osteoarthritis, right knee: Secondary | ICD-10-CM | POA: Diagnosis not present

## 2023-07-19 ENCOUNTER — Other Ambulatory Visit (HOSPITAL_COMMUNITY): Payer: Self-pay

## 2023-07-22 ENCOUNTER — Ambulatory Visit: Payer: Medicare HMO | Admitting: Pharmacist

## 2023-07-22 ENCOUNTER — Other Ambulatory Visit (HOSPITAL_COMMUNITY): Payer: Self-pay

## 2023-07-22 DIAGNOSIS — Z79899 Other long term (current) drug therapy: Secondary | ICD-10-CM

## 2023-07-22 NOTE — Progress Notes (Signed)
07/22/2023 Name: Hector Edwards MRN: [id number] DOB: May 20, 1946  Chief Complaint  Patient presents with   Medication Management    Adherence packaging    Hector Edwards is a 77 y.o. year old male.Completed phone visit with his caregiver, sister Hector Edwards by telephone visit.   They were referred to the pharmacist by a quality report for assistance in managing complex medication management.    Subjective: Hector Edwards previously used [facility] but last month he received adherence packaging box from [facility] since [facility] has closed. He has followed the schedule below.   AM / Breakfast: Alfuzosin ER 10mg  Aspirin 81mg   Atorvastatin 80mg   Diltiazem Er 180mg   Metoprolol 25mg   PM / Dinner:  Benazepril 20mg  Metoprolol 25mg   Pantoprazole 40mg     Medication Access/Adherence  Current Pharmacy:   [facility] [address] [place], [place] - [address] [place] [address] Phone: [phone] Fax: [phone]  [facility] [address] [place] [address] Phone: [phone] Fax: [phone]   Patient reports affordability concerns with their medications: No  Patient reports access/transportation concerns to their pharmacy: Yes  - sister picks up meds if needed but patient is currently set up for all meds to be delivered to his home.  Patient reports adherence concerns with their medications:  Yes  patient forgets to take medications if in bottles but he does remember packaging.     Objective:  Lab Results  Component Value Date   HGBA1C 5.4 08/02/2022    Lab Results  Component Value Date   CREATININE 0.94 08/17/2022   BUN 18 08/17/2022   Edwards 134 (L) 08/17/2022   K 4.0 08/17/2022   CL 101 08/17/2022   CO2 24 08/17/2022    Lab Results  Component Value Date   CHOL 88 08/07/2022   HDL 16 (L) 08/07/2022   LDLCALC 51 08/07/2022   LDLDIRECT 158.5 10/19/2008   TRIG 107  08/07/2022   CHOLHDL 5.5 08/07/2022    Medications Reviewed Today     Reviewed by Henrene Pastor, RPH-CPP (Pharmacist) on 07/22/23 at 1317  Med List Status: <None>   Medication Order Taking? Sig Documenting Provider Last Dose Status Informant  alfuzosin (UROXATRAL) 10 MG 24 hr tablet [id number] Yes Take 1 tablet (10 mg total) by mouth daily with breakfast. Wanda Plump, MD Taking Active   aspirin EC 81 MG tablet [id number] Yes Take 1 tablet (81 mg total) by mouth daily with breakfast. Swallow whole. Wanda Plump, MD Taking Active   atorvastatin (LIPITOR) 80 MG tablet [id number] Yes Take 1 tablet (80 mg total) by mouth daily with breakfast. Wanda Plump, MD Taking Active   benazepril (LOTENSIN) 20 MG tablet [id number] Yes Take 1 tablet (20 mg total) by mouth daily with supper. Wanda Plump, MD Taking Active   diltiazem (CARDIZEM CD) 180 MG 24 hr capsule [id number] Yes Take 1 capsule (180 mg total) by mouth every morning. Wanda Plump, MD Taking Active   metoprolol tartrate (LOPRESSOR) 25 MG tablet [id number] Yes Take 1 tablet (25 mg total) by mouth 2 (two) times daily with morning and evening meals Wanda Plump, MD Taking Active   pantoprazole (PROTONIX) 40 MG tablet [id number] Yes Take 1 tablet (40 mg total) by mouth every evening. Wanda Plump, MD Taking Active               Assessment/Plan:  Medication Adherence - good with packaging.  -  Coordinated with [facility] to start refills for next packaging - should start 07/29/2023. Email was sent to Sherlynn Stalls and [employer] Shipper - Reminded caregiver for Hector Edwards to get annual flu vaccine. Can get at any local pharmacy or the [facility].  - Set up yearly follow up appointment for 08/30/2023 with Ria Clock.   Follow Up Plan: next month prior to need for adherence packing refills.   Henrene Pastor, PharmD Clinical Pharmacist [place] [facility]

## 2023-07-23 ENCOUNTER — Other Ambulatory Visit (HOSPITAL_COMMUNITY): Payer: Self-pay

## 2023-08-14 ENCOUNTER — Other Ambulatory Visit (HOSPITAL_COMMUNITY): Payer: Self-pay

## 2023-08-14 ENCOUNTER — Other Ambulatory Visit: Payer: Self-pay | Admitting: Cardiovascular Disease

## 2023-08-14 ENCOUNTER — Other Ambulatory Visit: Payer: Self-pay | Admitting: Internal Medicine

## 2023-08-14 ENCOUNTER — Other Ambulatory Visit: Payer: Self-pay | Admitting: Family

## 2023-08-14 ENCOUNTER — Other Ambulatory Visit: Payer: Self-pay

## 2023-08-14 DIAGNOSIS — K219 Gastro-esophageal reflux disease without esophagitis: Secondary | ICD-10-CM

## 2023-08-14 MED ORDER — ATORVASTATIN CALCIUM 80 MG PO TABS
80.0000 mg | ORAL_TABLET | Freq: Every day | ORAL | 1 refills | Status: DC
  Filled 2023-08-14: qty 30, 30d supply, fill #0
  Filled 2023-09-18 – 2023-09-19 (×2): qty 30, 30d supply, fill #1
  Filled 2023-10-07 – 2023-10-15 (×2): qty 30, 30d supply, fill #2
  Filled 2023-11-04 – 2023-11-07 (×2): qty 30, 30d supply, fill #3
  Filled 2023-11-21 – 2023-12-03 (×2): qty 30, 30d supply, fill #4
  Filled 2023-12-12 – 2023-12-26 (×2): qty 30, 30d supply, fill #5

## 2023-08-14 MED ORDER — METOPROLOL TARTRATE 25 MG PO TABS
25.0000 mg | ORAL_TABLET | Freq: Two times a day (BID) | ORAL | 1 refills | Status: DC
  Filled 2023-08-14: qty 60, 30d supply, fill #0
  Filled 2023-09-18 – 2023-09-19 (×2): qty 60, 30d supply, fill #1
  Filled 2023-10-07 – 2023-10-15 (×2): qty 60, 30d supply, fill #2
  Filled 2023-11-04 – 2023-11-07 (×2): qty 60, 30d supply, fill #3
  Filled 2023-11-21 – 2023-12-03 (×2): qty 60, 30d supply, fill #4
  Filled 2023-12-12 – 2023-12-26 (×2): qty 60, 30d supply, fill #5

## 2023-08-14 MED ORDER — PANTOPRAZOLE SODIUM 40 MG PO TBEC
40.0000 mg | DELAYED_RELEASE_TABLET | Freq: Every evening | ORAL | 0 refills | Status: DC
Start: 2023-08-14 — End: 2023-09-18
  Filled 2023-08-14: qty 30, 30d supply, fill #0

## 2023-08-14 MED ORDER — DILTIAZEM HCL ER COATED BEADS 180 MG PO CP24
180.0000 mg | ORAL_CAPSULE | ORAL | 1 refills | Status: DC
  Filled 2023-08-14: qty 30, 30d supply, fill #0
  Filled 2023-09-18 – 2023-09-19 (×2): qty 30, 30d supply, fill #1
  Filled 2023-10-07 – 2023-10-15 (×2): qty 30, 30d supply, fill #2
  Filled 2023-11-04 – 2023-11-07 (×2): qty 30, 30d supply, fill #3
  Filled 2023-11-21 – 2023-12-03 (×2): qty 30, 30d supply, fill #4
  Filled 2023-12-12 – 2023-12-26 (×2): qty 30, 30d supply, fill #5

## 2023-08-14 MED ORDER — BENAZEPRIL HCL 20 MG PO TABS
20.0000 mg | ORAL_TABLET | Freq: Every day | ORAL | 1 refills | Status: DC
  Filled 2023-08-14: qty 30, 30d supply, fill #0
  Filled 2023-09-18 – 2023-09-19 (×2): qty 30, 30d supply, fill #1
  Filled 2023-10-07 – 2023-10-15 (×2): qty 30, 30d supply, fill #2
  Filled 2023-11-04 – 2023-11-07 (×2): qty 30, 30d supply, fill #3
  Filled 2023-11-21 – 2023-12-03 (×2): qty 30, 30d supply, fill #4
  Filled 2023-12-12 – 2023-12-26 (×2): qty 30, 30d supply, fill #5

## 2023-08-14 MED ORDER — ALFUZOSIN HCL ER 10 MG PO TB24
10.0000 mg | ORAL_TABLET | Freq: Every day | ORAL | 1 refills | Status: DC
  Filled 2023-08-14: qty 30, 30d supply, fill #0
  Filled 2023-09-18 – 2023-09-19 (×2): qty 30, 30d supply, fill #1

## 2023-08-22 ENCOUNTER — Other Ambulatory Visit: Payer: Self-pay | Admitting: Pharmacist

## 2023-08-26 ENCOUNTER — Other Ambulatory Visit: Payer: Self-pay

## 2023-08-30 ENCOUNTER — Ambulatory Visit: Payer: Medicare HMO | Admitting: Family

## 2023-09-03 ENCOUNTER — Other Ambulatory Visit: Payer: Self-pay

## 2023-09-03 ENCOUNTER — Ambulatory Visit (INDEPENDENT_AMBULATORY_CARE_PROVIDER_SITE_OTHER): Payer: Medicare HMO | Admitting: Family

## 2023-09-03 ENCOUNTER — Encounter: Payer: Self-pay | Admitting: Family

## 2023-09-03 VITALS — BP 124/78 | HR 74 | Ht 76.0 in | Wt 298.4 lb

## 2023-09-03 DIAGNOSIS — Z0000 Encounter for general adult medical examination without abnormal findings: Secondary | ICD-10-CM | POA: Diagnosis not present

## 2023-09-03 DIAGNOSIS — I25110 Atherosclerotic heart disease of native coronary artery with unstable angina pectoris: Secondary | ICD-10-CM

## 2023-09-03 DIAGNOSIS — Z1159 Encounter for screening for other viral diseases: Secondary | ICD-10-CM | POA: Diagnosis not present

## 2023-09-03 DIAGNOSIS — Z125 Encounter for screening for malignant neoplasm of prostate: Secondary | ICD-10-CM | POA: Diagnosis not present

## 2023-09-03 DIAGNOSIS — E785 Hyperlipidemia, unspecified: Secondary | ICD-10-CM

## 2023-09-03 DIAGNOSIS — Z1211 Encounter for screening for malignant neoplasm of colon: Secondary | ICD-10-CM

## 2023-09-03 LAB — LIPID PANEL
Cholesterol: 87 mg/dL (ref 0–200)
HDL: 30.4 mg/dL — ABNORMAL LOW (ref >39.00)
LDL Cholesterol: 44 mg/dL (ref 0–99)
NonHDL: 56.35
Total CHOL/HDL Ratio: 3
Triglycerides: 63 mg/dL (ref 0.0–149.0)
VLDL: 12.6 mg/dL (ref 0.0–40.0)

## 2023-09-03 LAB — CBC WITH DIFFERENTIAL/PLATELET
Basophils Absolute: 0 10*3/uL (ref 0.0–0.1)
Basophils Relative: 0.2 % (ref 0.0–3.0)
Eosinophils Absolute: 0 10*3/uL (ref 0.0–0.7)
Eosinophils Relative: 0.6 % (ref 0.0–5.0)
HCT: 43.1 % (ref 39.0–52.0)
Hemoglobin: 14.4 g/dL (ref 13.0–17.0)
Lymphocytes Relative: 30.7 % (ref 12.0–46.0)
Lymphs Abs: 1.9 10*3/uL (ref 0.7–4.0)
MCHC: 33.5 g/dL (ref 30.0–36.0)
MCV: 100.4 fL — ABNORMAL HIGH (ref 78.0–100.0)
Monocytes Absolute: 0.8 10*3/uL (ref 0.1–1.0)
Monocytes Relative: 11.9 % (ref 3.0–12.0)
Neutro Abs: 3.6 10*3/uL (ref 1.4–7.7)
Neutrophils Relative %: 56.6 % (ref 43.0–77.0)
Platelets: 176 10*3/uL (ref 150.0–400.0)
RBC: 4.29 Mil/uL (ref 4.22–5.81)
RDW: 15.2 % (ref 11.5–15.5)
WBC: 6.3 10*3/uL (ref 4.0–10.5)

## 2023-09-03 LAB — COMPREHENSIVE METABOLIC PANEL
ALT: 19 U/L (ref 0–53)
AST: 14 U/L (ref 0–37)
Albumin: 4.2 g/dL (ref 3.5–5.2)
Alkaline Phosphatase: 82 U/L (ref 39–117)
BUN: 13 mg/dL (ref 6–23)
CO2: 30 meq/L (ref 19–32)
Calcium: 9.5 mg/dL (ref 8.4–10.5)
Chloride: 103 meq/L (ref 96–112)
Creatinine, Ser: 0.97 mg/dL (ref 0.40–1.50)
GFR: 76.59 mL/min (ref >60.00)
Glucose, Bld: 102 mg/dL — ABNORMAL HIGH (ref 70–99)
Potassium: 4.3 meq/L (ref 3.5–5.1)
Sodium: 141 meq/L (ref 135–145)
Total Bilirubin: 1.2 mg/dL (ref 0.2–1.2)
Total Protein: 6.8 g/dL (ref 6.0–8.3)

## 2023-09-03 LAB — PSA: PSA: 2.53 ng/mL (ref 0.10–4.00)

## 2023-09-03 NOTE — Progress Notes (Signed)
Hector Edwards is a 77 y.o. male with the following history as recorded in EpicCare:  Patient Active Problem List   Diagnosis Date Noted   Secondary hypercoagulable state (HCC) 08/22/2022   Persistent atrial fibrillation (HCC)    S/P CABG x 3 08/03/2022   Coronary artery disease involving native coronary artery of native heart with unstable angina pectoris (HCC)    Class 2 severe obesity with serious comorbidity and body mass index (BMI) of 37.0 to 37.9 in adult, unspecified obesity type (HCC) 10/27/2020   OSA (obstructive sleep apnea) 08/01/2018   Benign prostatic hyperplasia with nocturia 11/26/2016   Prostate cancer screening 11/26/2016   Medicare annual wellness visit, subsequent 11/26/2016   Hx of adenomatous colonic polyps 09/04/2015   Annual physical exam 07/20/2015   Visit for preventive health examination 05/04/2013   Erectile dysfunction 05/04/2013   Snoring 02/13/2012   GERD (gastroesophageal reflux disease) 08/12/2011   Osteoarthritis 08/08/2011   Hyperlipidemia 10/13/2007   Essential hypertension 10/13/2007    Current Outpatient Medications  Medication Sig Dispense Refill   alfuzosin (UROXATRAL) 10 MG 24 hr tablet Take 1 tablet (10 mg total) by mouth daily with breakfast. 30 tablet 1   aspirin EC 81 MG tablet Take 1 tablet (81 mg total) by mouth daily with breakfast. Swallow whole. 30 tablet 11   atorvastatin (LIPITOR) 80 MG tablet Take 1 tablet (80 mg total) by mouth daily with breakfast. 90 tablet 1   benazepril (LOTENSIN) 20 MG tablet Take 1 tablet (20 mg total) by mouth daily with supper. 90 tablet 1   diltiazem (CARDIZEM CD) 180 MG 24 hr capsule Take 1 capsule (180 mg total) by mouth every morning. 90 capsule 1   metoprolol tartrate (LOPRESSOR) 25 MG tablet Take 1 tablet (25 mg total) by mouth 2 (two) times daily with morning and evening meals 180 tablet 1   pantoprazole (PROTONIX) 40 MG tablet Take 1 tablet (40 mg total) by mouth every evening. Please make  appointment for refills 30 tablet 0   No current facility-administered medications for this visit.    Allergies: Hctz [hydrochlorothiazide], Enalapril maleate, Tribenzor [olmesartan-amlodipine-hctz], and Verapamil  Past Medical History:  Diagnosis Date   Depression    GERD (gastroesophageal reflux disease)    Hx of adenomatous colonic polyps 09/04/2015   Hyperlipidemia    Hypertension    LBP (low back pain)    OSA (obstructive sleep apnea) 08/01/2018   Sleep apnea    wears cpap     Past Surgical History:  Procedure Laterality Date   CARDIOVERSION N/A 08/08/2022   Procedure: CARDIOVERSION;  Surgeon: Chilton Si, MD;  Location: [facility] ENDOSCOPY;  Service: Cardiovascular;  Laterality: N/A;   COLONOSCOPY  2004, 2021   hyperplastic polyps x 2   CORONARY ARTERY BYPASS GRAFT N/A 08/03/2022   Procedure: CORONARY ARTERY BYPASS GRAFTING (CABG) 3X, USING RIGHT GREATER SAPHENOUS VEIN AND LEFT INTERNAL MAMMARY ARTERY.;  Surgeon: Alleen Borne, MD;  Location: [facility] OR;  Service: Open Heart Surgery;  Laterality: N/A;   KNEE ARTHROSCOPY Right    LEFT HEART CATH AND CORONARY ANGIOGRAPHY N/A 08/01/2022   Procedure: LEFT HEART CATH AND CORONARY ANGIOGRAPHY;  Surgeon: Kathleene Hazel, MD;  Location: [facility] INVASIVE CV LAB;  Service: Cardiovascular;  Laterality: N/A;   PENILE PROSTHESIS IMPLANT N/A 01/25/2020   Procedure: PENILE PROTHESIS INFLATABLE COLOPLAST;  Surgeon: Crista Elliot, MD;  Location[facility] ;  Service: Urology;  Laterality: N/A;   SHOULDER ARTHROSCOPY W/ ROTATOR CUFF REPAIR  Left    TEE WITHOUT CARDIOVERSION N/A 08/03/2022   Procedure: TRANSESOPHAGEAL ECHOCARDIOGRAM (TEE);  Surgeon: Alleen Borne, MD;  Location: [facility] OR;  Service: Open Heart Surgery;  Laterality: N/A;   TEE WITHOUT CARDIOVERSION N/A 08/08/2022   Procedure: TRANSESOPHAGEAL ECHOCARDIOGRAM (TEE);  Surgeon: Chilton Si, MD;  Location: [facility] ENDOSCOPY;  Service: Cardiovascular;  Laterality: N/A;    TONSILLECTOMY  as child   vocal cord growth removed  age 20   benign   WISDOM TOOTH EXTRACTION      Family History  Problem Relation Age of Onset   Stroke Mother 52   Depression Mother    Hypertension Father    Heart disease Father    Colon polyps Father    Pancreatic cancer Brother    Colon polyps Brother    Throat cancer Brother    Breast cancer Daughter    Hypertension Other    Colon cancer Neg Hx    Esophageal cancer Neg Hx    Stomach cancer Neg Hx    Rectal cancer Neg Hx     Social History   Tobacco Use   Smoking status: Never   Smokeless tobacco: Never   Tobacco comments:    Never smoke 08/22/22  Substance Use Topics   Alcohol use: Not Currently    Alcohol/week: 4.0 standard drinks of alcohol    Types: 4 Cans of beer per week    Comment: stop drinking 08/22/22    Subjective:   Presents for yearly exam; no acute concerns today; overdue to see his cardiologist; Is up to date on dental and vision exams;  Defers flu shot; defers colonoscopy but agrees to Cologuard; Not exercising regularly;  Does not have hearing aids in at time of appointment-  Review of Systems  Constitutional: Negative.   HENT: Negative.    Eyes: Negative.   Respiratory: Negative.    Cardiovascular: Negative.   Gastrointestinal: Negative.   Genitourinary: Negative.   Musculoskeletal: Negative.   Skin: Negative.   Neurological: Negative.   Endo/Heme/Allergies: Negative.   Psychiatric/Behavioral: Negative.      Objective:  Vitals:   09/03/23 1117  BP: 124/78  Pulse: 74  SpO2: 97%  Weight: 298 lb 6.4 oz (135.4 kg)  Height: 6\' 4"  (1.93 m)    General: Well developed, well nourished, in no acute distress  Skin : Warm and dry.  Head: Normocephalic and atraumatic  Eyes: Sclera and conjunctiva clear; pupils round and reactive to light; extraocular movements intact  Ears: External normal; canals clear; tympanic membranes normal  Oropharynx: Pink, supple. No suspicious lesions  Neck:  Supple without thyromegaly, adenopathy  Lungs: Respirations unlabored; clear to auscultation bilaterally without wheeze, rales, rhonchi  CVS exam: normal rate and regular rhythm.  Abdomen: Soft; nontender; nondistended; normoactive bowel sounds; no masses or hepatosplenomegaly  Musculoskeletal: No deformities; no active joint inflammation  Extremities: No edema, cyanosis, clubbing  Vessels: Symmetric bilaterally  Neurologic: Alert and oriented; speech intact; face symmetrical; moves all extremities well; CNII-XII intact without focal deficit   Assessment:  1. PE (physical exam), annual   2. Hyperlipidemia, unspecified hyperlipidemia type   3. Prostate cancer screening   4. Need for hepatitis C screening test   5. Colon cancer screening   6. Coronary artery disease involving native coronary artery of native heart with unstable angina pectoris [facility])     Plan:  Age appropriate preventive healthcare needs addressed; encouraged regular eye doctor and dental exams; encouraged regular exercise; will update labs and refills as needed  today; referral updated back to his cardiologist; follow-up in 1 year, sooner prn; Cologuard ordered;  Patient defers flu shot;   No follow-ups on file.  Orders Placed This Encounter  Procedures   CBC with Differential/Platelet   Comp Met (CMET)   Lipid panel   PSA   Hepatitis C Antibody   Cologuard   Ambulatory referral to Cardiology    Referral Priority:   Routine    Referral Type:   Consultation    Referral Reason:   Specialty Services Required    Referred to Provider:   Kathleene Hazel, MD    Number of Visits Requested:   1    Requested Prescriptions    No prescriptions requested or ordered in this encounter

## 2023-09-03 NOTE — Patient Instructions (Signed)
   You are overdue to see your cardiologist- you were due for an appointment there in October 2024;   Verne Carrow, MD 01/14/2023 11:21 AM    [facility] [address] [place], [place], [place]  [address] Phone: [phone]; Fax: [phone]

## 2023-09-04 LAB — HEPATITIS C ANTIBODY: Hepatitis C Ab: NONREACTIVE

## 2023-09-18 ENCOUNTER — Other Ambulatory Visit: Payer: Self-pay | Admitting: Family

## 2023-09-18 ENCOUNTER — Other Ambulatory Visit: Payer: Self-pay

## 2023-09-18 DIAGNOSIS — K219 Gastro-esophageal reflux disease without esophagitis: Secondary | ICD-10-CM

## 2023-09-19 ENCOUNTER — Other Ambulatory Visit (HOSPITAL_COMMUNITY): Payer: Self-pay

## 2023-09-19 ENCOUNTER — Other Ambulatory Visit: Payer: Self-pay

## 2023-09-19 MED ORDER — PANTOPRAZOLE SODIUM 40 MG PO TBEC
40.0000 mg | DELAYED_RELEASE_TABLET | Freq: Every evening | ORAL | 3 refills | Status: DC
  Filled 2023-09-19: qty 30, 30d supply, fill #0
  Filled 2023-10-07 – 2023-10-15 (×2): qty 30, 30d supply, fill #1
  Filled 2023-11-04 – 2023-11-07 (×2): qty 30, 30d supply, fill #2
  Filled 2023-11-21 – 2023-12-03 (×2): qty 30, 30d supply, fill #3
  Filled 2023-12-12 – 2023-12-26 (×2): qty 30, 30d supply, fill #4
  Filled 2024-01-21: qty 30, 30d supply, fill #5
  Filled 2024-02-03 – 2024-02-19 (×3): qty 30, 30d supply, fill #6
  Filled 2024-03-09 – 2024-04-09 (×2): qty 30, 30d supply, fill #7
  Filled 2024-05-12: qty 30, 30d supply, fill #8
  Filled 2024-06-09: qty 30, 30d supply, fill #9
  Filled 2024-07-09: qty 30, 30d supply, fill #10
  Filled 2024-08-17: qty 30, 30d supply, fill #11

## 2023-10-07 ENCOUNTER — Other Ambulatory Visit: Payer: Self-pay | Admitting: Family

## 2023-10-07 ENCOUNTER — Other Ambulatory Visit (HOSPITAL_COMMUNITY): Payer: Self-pay

## 2023-10-07 ENCOUNTER — Telehealth: Payer: Self-pay | Admitting: Family

## 2023-10-07 MED ORDER — ALFUZOSIN HCL ER 10 MG PO TB24
10.0000 mg | ORAL_TABLET | Freq: Every day | ORAL | 1 refills | Status: DC
  Filled 2023-10-15: qty 30, 30d supply, fill #0
  Filled 2023-11-04 – 2023-11-07 (×2): qty 30, 30d supply, fill #1

## 2023-10-08 ENCOUNTER — Other Ambulatory Visit: Payer: Self-pay

## 2023-10-08 ENCOUNTER — Other Ambulatory Visit (HOSPITAL_COMMUNITY): Payer: Self-pay

## 2023-10-12 ENCOUNTER — Encounter (HOSPITAL_BASED_OUTPATIENT_CLINIC_OR_DEPARTMENT_OTHER): Payer: Self-pay

## 2023-10-12 ENCOUNTER — Emergency Department (HOSPITAL_BASED_OUTPATIENT_CLINIC_OR_DEPARTMENT_OTHER): Payer: Medicare HMO

## 2023-10-12 ENCOUNTER — Observation Stay (HOSPITAL_BASED_OUTPATIENT_CLINIC_OR_DEPARTMENT_OTHER)
Admission: EM | Admit: 2023-10-12 | Discharge: 2023-10-13 | Disposition: A | Discharge status: Home / Self Care | Payer: Medicare HMO | Attending: Internal Medicine | Admitting: Internal Medicine

## 2023-10-12 DIAGNOSIS — N401 Enlarged prostate with lower urinary tract symptoms: Secondary | ICD-10-CM | POA: Diagnosis not present

## 2023-10-12 DIAGNOSIS — Z0389 Encounter for observation for other suspected diseases and conditions ruled out: Secondary | ICD-10-CM | POA: Diagnosis not present

## 2023-10-12 DIAGNOSIS — K219 Gastro-esophageal reflux disease without esophagitis: Secondary | ICD-10-CM | POA: Diagnosis present

## 2023-10-12 DIAGNOSIS — Z951 Presence of aortocoronary bypass graft: Secondary | ICD-10-CM | POA: Diagnosis not present

## 2023-10-12 DIAGNOSIS — R351 Nocturia: Secondary | ICD-10-CM | POA: Insufficient documentation

## 2023-10-12 DIAGNOSIS — W19XXXA Unspecified fall, initial encounter: Secondary | ICD-10-CM | POA: Diagnosis not present

## 2023-10-12 DIAGNOSIS — I959 Hypotension, unspecified: Secondary | ICD-10-CM | POA: Diagnosis not present

## 2023-10-12 DIAGNOSIS — Z7901 Long term (current) use of anticoagulants: Secondary | ICD-10-CM | POA: Insufficient documentation

## 2023-10-12 DIAGNOSIS — R61 Generalized hyperhidrosis: Secondary | ICD-10-CM | POA: Diagnosis not present

## 2023-10-12 DIAGNOSIS — I480 Paroxysmal atrial fibrillation: Secondary | ICD-10-CM | POA: Diagnosis present

## 2023-10-12 DIAGNOSIS — G4733 Obstructive sleep apnea (adult) (pediatric): Secondary | ICD-10-CM | POA: Diagnosis present

## 2023-10-12 DIAGNOSIS — R059 Cough, unspecified: Secondary | ICD-10-CM | POA: Diagnosis present

## 2023-10-12 DIAGNOSIS — E871 Hypo-osmolality and hyponatremia: Secondary | ICD-10-CM

## 2023-10-12 DIAGNOSIS — Z0000 Encounter for general adult medical examination without abnormal findings: Secondary | ICD-10-CM

## 2023-10-12 DIAGNOSIS — Z79899 Other long term (current) drug therapy: Secondary | ICD-10-CM | POA: Insufficient documentation

## 2023-10-12 DIAGNOSIS — U071 COVID-19: Principal | ICD-10-CM

## 2023-10-12 DIAGNOSIS — Z8659 Personal history of other mental and behavioral disorders: Secondary | ICD-10-CM | POA: Insufficient documentation

## 2023-10-12 DIAGNOSIS — E876 Hypokalemia: Secondary | ICD-10-CM | POA: Insufficient documentation

## 2023-10-12 DIAGNOSIS — I517 Cardiomegaly: Secondary | ICD-10-CM | POA: Diagnosis not present

## 2023-10-12 DIAGNOSIS — I4819 Other persistent atrial fibrillation: Secondary | ICD-10-CM | POA: Diagnosis not present

## 2023-10-12 DIAGNOSIS — I10 Essential (primary) hypertension: Secondary | ICD-10-CM | POA: Diagnosis not present

## 2023-10-12 DIAGNOSIS — R0989 Other specified symptoms and signs involving the circulatory and respiratory systems: Secondary | ICD-10-CM | POA: Diagnosis not present

## 2023-10-12 DIAGNOSIS — I6782 Cerebral ischemia: Secondary | ICD-10-CM | POA: Diagnosis not present

## 2023-10-12 DIAGNOSIS — E860 Dehydration: Secondary | ICD-10-CM | POA: Diagnosis not present

## 2023-10-12 DIAGNOSIS — N179 Acute kidney failure, unspecified: Secondary | ICD-10-CM | POA: Diagnosis not present

## 2023-10-12 DIAGNOSIS — R55 Syncope and collapse: Secondary | ICD-10-CM | POA: Diagnosis not present

## 2023-10-12 DIAGNOSIS — S0990XA Unspecified injury of head, initial encounter: Secondary | ICD-10-CM | POA: Diagnosis not present

## 2023-10-12 DIAGNOSIS — R531 Weakness: Secondary | ICD-10-CM | POA: Diagnosis not present

## 2023-10-12 LAB — COMPREHENSIVE METABOLIC PANEL
ALT: 20 U/L (ref 0–44)
AST: 26 U/L (ref 15–41)
Albumin: 3.9 g/dL (ref 3.5–5.0)
Alkaline Phosphatase: 66 U/L (ref 38–126)
Anion gap: 10 (ref 5–15)
BUN: 13 mg/dL (ref 8–23)
CO2: 24 mmol/L (ref 22–32)
Calcium: 8.8 mg/dL — ABNORMAL LOW (ref 8.9–10.3)
Chloride: 98 mmol/L (ref 98–111)
Creatinine, Ser: 1.91 mg/dL — ABNORMAL HIGH (ref 0.61–1.24)
GFR, Estimated: 36 mL/min — ABNORMAL LOW (ref >60)
Glucose, Bld: 150 mg/dL — ABNORMAL HIGH (ref 70–99)
Potassium: 4.3 mmol/L (ref 3.5–5.1)
Sodium: 132 mmol/L — ABNORMAL LOW (ref 135–145)
Total Bilirubin: 1.2 mg/dL (ref 0.0–1.2)
Total Protein: 7.4 g/dL (ref 6.5–8.1)

## 2023-10-12 LAB — URINALYSIS, W/ REFLEX TO CULTURE (INFECTION SUSPECTED)
Bilirubin Urine: NEGATIVE
Glucose, UA: NEGATIVE mg/dL
Hgb urine dipstick: NEGATIVE
Ketones, ur: NEGATIVE mg/dL
Leukocytes,Ua: NEGATIVE
Nitrite: NEGATIVE
Protein, ur: 30 mg/dL — AB
RBC / HPF: NONE SEEN RBC/hpf (ref 0–5)
Specific Gravity, Urine: <=1.005 (ref 1.005–1.030)
pH: 5.5 (ref 5.0–8.0)

## 2023-10-12 LAB — CBC WITH DIFFERENTIAL/PLATELET
Abs Immature Granulocytes: 0.19 10*3/uL — ABNORMAL HIGH (ref 0.00–0.07)
Basophils Absolute: 0 10*3/uL (ref 0.0–0.1)
Basophils Relative: 0 %
Eosinophils Absolute: 0.1 10*3/uL (ref 0.0–0.5)
Eosinophils Relative: 1 %
HCT: 42.6 % (ref 39.0–52.0)
Hemoglobin: 14.5 g/dL (ref 13.0–17.0)
Immature Granulocytes: 3 %
Lymphocytes Relative: 15 %
Lymphs Abs: 0.9 10*3/uL (ref 0.7–4.0)
MCH: 33.5 pg (ref 26.0–34.0)
MCHC: 34 g/dL (ref 30.0–36.0)
MCV: 98.4 fL (ref 80.0–100.0)
Monocytes Absolute: 1.6 10*3/uL — ABNORMAL HIGH (ref 0.1–1.0)
Monocytes Relative: 25 %
Neutro Abs: 3.7 10*3/uL (ref 1.7–7.7)
Neutrophils Relative %: 56 %
Platelets: 156 10*3/uL (ref 150–400)
RBC: 4.33 MIL/uL (ref 4.22–5.81)
RDW: 14.1 % (ref 11.5–15.5)
WBC: 6.5 10*3/uL (ref 4.0–10.5)
nRBC: 0 % (ref 0.0–0.2)

## 2023-10-12 LAB — TROPONIN I (HIGH SENSITIVITY)
Troponin I (High Sensitivity): 17 ng/L (ref <18)
Troponin I (High Sensitivity): 10 ng/L (ref <18)

## 2023-10-12 LAB — CBG MONITORING, ED: Glucose-Capillary: 147 mg/dL — ABNORMAL HIGH (ref 70–99)

## 2023-10-12 LAB — PROTIME-INR
INR: 1 (ref 0.8–1.2)
Prothrombin Time: 13.7 s (ref 11.4–15.2)

## 2023-10-12 LAB — APTT: aPTT: 32 s (ref 24–36)

## 2023-10-12 LAB — RESP PANEL BY RT-PCR (RSV, FLU A&B, COVID)  RVPGX2
Influenza A by PCR: NEGATIVE
Influenza B by PCR: NEGATIVE
Resp Syncytial Virus by PCR: NEGATIVE
SARS Coronavirus 2 by RT PCR: POSITIVE — AB

## 2023-10-12 LAB — LACTIC ACID, PLASMA
Lactic Acid, Venous: 2.4 mmol/L (ref 0.5–1.9)
Lactic Acid, Venous: 1.9 mmol/L (ref 0.5–1.9)

## 2023-10-12 LAB — PROCALCITONIN: Procalcitonin: 0.46 ng/mL

## 2023-10-12 MED ORDER — ASPIRIN 81 MG PO TBEC
81.0000 mg | DELAYED_RELEASE_TABLET | Freq: Every day | ORAL | Status: DC
  Administered 2023-10-13: 81 mg via ORAL
  Filled 2023-10-12: qty 1

## 2023-10-12 MED ORDER — VITAMIN C 500 MG PO TABS
500.0000 mg | ORAL_TABLET | Freq: Two times a day (BID) | ORAL | Status: DC
  Administered 2023-10-12 – 2023-10-13 (×2): 500 mg via ORAL
  Filled 2023-10-12 (×2): qty 1

## 2023-10-12 MED ORDER — ACETAMINOPHEN 650 MG RE SUPP: 650.0000 mg | Freq: Four times a day (QID) | RECTAL | Status: DC | PRN

## 2023-10-12 MED ORDER — ACETAMINOPHEN 325 MG PO TABS: 650.0000 mg | ORAL_TABLET | Freq: Four times a day (QID) | ORAL | Status: DC | PRN

## 2023-10-12 MED ORDER — NIRMATRELVIR/RITONAVIR (PAXLOVID) TABLET (RENAL DOSING)
2.0000 | ORAL_TABLET | Freq: Two times a day (BID) | ORAL | Status: DC
  Administered 2023-10-12 – 2023-10-13 (×2): 2 via ORAL
  Filled 2023-10-12 (×2): qty 20

## 2023-10-12 MED ORDER — ATORVASTATIN CALCIUM 40 MG PO TABS: 80.0000 mg | ORAL_TABLET | Freq: Every day | ORAL | Status: DC

## 2023-10-12 MED ORDER — ENOXAPARIN SODIUM 60 MG/0.6ML IJ SOSY
60.0000 mg | PREFILLED_SYRINGE | INTRAMUSCULAR | Status: DC
  Administered 2023-10-12: 60 mg via SUBCUTANEOUS
  Filled 2023-10-12: qty 0.6

## 2023-10-12 MED ORDER — LACTATED RINGERS IV BOLUS (SEPSIS)
2000.0000 mL | Freq: Once | INTRAVENOUS | Status: AC
  Administered 2023-10-12: 2000 mL via INTRAVENOUS

## 2023-10-12 MED ORDER — SODIUM CHLORIDE 0.9 % IV SOLN
500.0000 mg | INTRAVENOUS | Status: DC
  Filled 2023-10-12: qty 5

## 2023-10-12 MED ORDER — ALBUTEROL SULFATE (2.5 MG/3ML) 0.083% IN NEBU
2.5000 mg | INHALATION_SOLUTION | Freq: Four times a day (QID) | RESPIRATORY_TRACT | Status: DC
  Administered 2023-10-12: 2.5 mg via RESPIRATORY_TRACT
  Filled 2023-10-12: qty 3

## 2023-10-12 MED ORDER — MELATONIN 3 MG PO TABS
3.0000 mg | ORAL_TABLET | Freq: Every day | ORAL | Status: DC
  Administered 2023-10-12: 3 mg via ORAL
  Filled 2023-10-12: qty 1

## 2023-10-12 MED ORDER — SODIUM CHLORIDE 0.9 % IV SOLN
2.0000 g | Freq: Once | INTRAVENOUS | Status: AC
  Administered 2023-10-12: 2 g via INTRAVENOUS
  Filled 2023-10-12: qty 20

## 2023-10-12 MED ORDER — SODIUM CHLORIDE 0.9 % IV SOLN
2.0000 g | INTRAVENOUS | Status: DC
  Administered 2023-10-13: 2 g via INTRAVENOUS
  Filled 2023-10-12: qty 20

## 2023-10-12 MED ORDER — METOPROLOL TARTRATE 25 MG PO TABS
25.0000 mg | ORAL_TABLET | Freq: Two times a day (BID) | ORAL | Status: DC
  Administered 2023-10-12 – 2023-10-13 (×2): 25 mg via ORAL
  Filled 2023-10-12 (×2): qty 1

## 2023-10-12 MED ORDER — SODIUM CHLORIDE 0.9 % IV SOLN
500.0000 mg | Freq: Once | INTRAVENOUS | Status: AC
  Administered 2023-10-12: 500 mg via INTRAVENOUS
  Filled 2023-10-12: qty 5

## 2023-10-12 MED ORDER — LACTATED RINGERS IV SOLN: INTRAVENOUS | Status: AC

## 2023-10-12 NOTE — Progress Notes (Signed)
 Patient states he has a CPAP at home but he does not like it and he does not use it.  He does not wish to use one while he is here.

## 2023-10-12 NOTE — Assessment & Plan Note (Addendum)
 Continue cpap at night  He states he doesn't wear, "it drives me crazy."

## 2023-10-12 NOTE — ED Provider Notes (Signed)
 [place] [facility] Provider Note   CSN: [id number] Arrival date & time: 10/12/23  9085     History  Chief Complaint  Patient presents with   Near Syncope    Hector Edwards is a 78 y.o. male.  HPI     78 year old male with a history of persistent atrial fibrillation, coronary artery disease status post coronary artery bypass graft x 3, OSA, hyperlipidemia, hypertension, presents with concern for cough, hypotension, near syncope.  Reports that he has had a cold since around Christmas, with coughing.  He has had low appetite and has not had not had anything to eat since the day before New Year's.  Reports cough, not eating or drinking much.  He went to play slots at a place this morning, and was leaning over to collect his money when he reports that the stool went out from under him and he fell.  He denies any loss of consciousness, but reports he felt too weak and lightheaded to stand up on his own and EMS was called.  There was some report of possible syncopal episode, he was noted to be incontinent of urine.  Patient reports that he did not have syncope and did not hit his head, however others reported that he did have a syncopal episode.  Denies any chest pain or shortness of breath.  Denies any known fevers.  Denies any nausea, vomiting, black or bloody stools, diarrhea.  Did take his bp medications today.  No congestion.  Past Medical History:  Diagnosis Date   Depression    GERD (gastroesophageal reflux disease)    Hx of adenomatous colonic polyps 09/04/2015   Hyperlipidemia    Hypertension    LBP (low back pain)    OSA (obstructive sleep apnea) 08/01/2018   Sleep apnea    wears cpap      Home Medications Prior to Admission medications   Medication Sig Start Date End Date Taking? Authorizing Provider  alfuzosin  (UROXATRAL ) 10 MG 24 hr tablet Take 1 tablet (10 mg total) by mouth daily with breakfast. 10/07/23  Yes Webb, Padonda B,  FNP  aspirin  EC 81 MG tablet Take 1 tablet (81 mg total) by mouth daily with breakfast. Swallow whole. 06/26/23  Yes Amon Aloysius BRAVO, MD  atorvastatin  (LIPITOR ) 80 MG tablet Take 1 tablet (80 mg total) by mouth daily with breakfast. 08/14/23  Yes Verlin Lonni BIRCH, MD  benazepril  (LOTENSIN ) 20 MG tablet Take 1 tablet (20 mg total) by mouth daily with supper. 08/14/23  Yes Verlin Lonni BIRCH, MD  diltiazem  (CARDIZEM  CD) 180 MG 24 hr capsule Take 1 capsule (180 mg total) by mouth every morning. 08/14/23  Yes Verlin Lonni BIRCH, MD  metoprolol  tartrate (LOPRESSOR ) 25 MG tablet Take 1 tablet (25 mg total) by mouth 2 (two) times daily with morning and evening meals 08/14/23  Yes Verlin Lonni BIRCH, MD  pantoprazole  (PROTONIX ) 40 MG tablet Take 1 tablet (40 mg total) by mouth every evening. Please make appointment for refills 09/19/23  Yes [name], FNP      Allergies    Hctz [hydrochlorothiazide], Enalapril maleate, Tribenzor [olmesartan -amlodipine -hctz], and Verapamil     Review of Systems   Review of Systems  Physical Exam Updated Vital Signs BP 117/80 (BP Location: Right Arm)   Pulse 97   Temp 99.2 F (37.3 C) (Oral)   Resp (!) 21   Ht 6' 4 (1.93 m)   Wt 135.4 kg   SpO2 96%  BMI 36.33 kg/m  Physical Exam Vitals and nursing note reviewed.  Constitutional:      General: He is not in acute distress.    Appearance: Normal appearance. He is well-developed. He is not ill-appearing or diaphoretic.  HENT:     Head: Normocephalic and atraumatic.  Eyes:     General: No visual field deficit.    Extraocular Movements: Extraocular movements intact.     Conjunctiva/sclera: Conjunctivae normal.     Pupils: Pupils are equal, round, and reactive to light.  Cardiovascular:     Rate and Rhythm: Normal rate and regular rhythm.     Pulses: Normal pulses.     Heart sounds: Normal heart sounds. No murmur heard.    No friction rub. No gallop.  Pulmonary:     Effort:  Pulmonary effort is normal. No respiratory distress.     Breath sounds: Normal breath sounds. No wheezing or rales.  Abdominal:     General: There is no distension.     Palpations: Abdomen is soft.     Tenderness: There is no abdominal tenderness. There is no guarding.  Musculoskeletal:        General: No swelling or tenderness.     Cervical back: Normal range of motion.  Skin:    General: Skin is warm and dry.     Findings: No erythema or rash.  Neurological:     General: No focal deficit present.     Mental Status: He is alert and oriented to person, place, and time.     GCS: GCS eye subscore is 4. GCS verbal subscore is 5. GCS motor subscore is 6.     Cranial Nerves: No cranial nerve deficit, dysarthria or facial asymmetry.     Sensory: No sensory deficit.     Motor: No weakness or tremor.     Coordination: Coordination normal. Finger-Nose-Finger Test normal.     Gait: Gait normal.     ED Results / Procedures / Treatments   Labs (all labs ordered are listed, but only abnormal results are displayed) Labs Reviewed  RESP PANEL BY RT-PCR (RSV, FLU A&B, COVID)  [id number] - Abnormal; Notable for the following components:      Result Value   SARS Coronavirus 2 by RT PCR POSITIVE (*)    All other components within normal limits  LACTIC ACID, PLASMA - Abnormal; Notable for the following components:   Lactic Acid, Venous 2.4 (*)    All other components within normal limits  COMPREHENSIVE METABOLIC PANEL - Abnormal; Notable for the following components:   Sodium 132 (*)    Glucose, Bld 150 (*)    Creatinine, Ser 1.91 (*)    Calcium  8.8 (*)    GFR, Estimated 36 (*)    All other components within normal limits  CBC WITH DIFFERENTIAL/PLATELET - Abnormal; Notable for the following components:   Monocytes Absolute 1.6 (*)    Abs Immature Granulocytes 0.19 (*)    All other components within normal limits  URINALYSIS, W/ REFLEX TO CULTURE (INFECTION SUSPECTED) - Abnormal; Notable for the  following components:   Protein, ur 30 (*)    Bacteria, UA FEW (*)    All other components within normal limits  CBG MONITORING, ED - Abnormal; Notable for the following components:   Glucose-Capillary 147 (*)    All other components within normal limits  CULTURE, BLOOD (ROUTINE X 2)  CULTURE, BLOOD (ROUTINE X 2)  LACTIC ACID, PLASMA  PROTIME-INR  APTT  PROCALCITONIN  PROCALCITONIN  COMPREHENSIVE METABOLIC PANEL  CBC  TROPONIN I (HIGH SENSITIVITY)  TROPONIN I (HIGH SENSITIVITY)    EKG EKG Interpretation Date/Time:  Saturday October 12 2023 09:22:35 EST Ventricular Rate:  95 PR Interval:  203 QRS Duration:  121 QT Interval:  378 QTC Calculation: 476 R Axis:   -54  Text Interpretation: Sinus rhythm Probable left atrial enlargement Non-specific intra-ventricular conduction delay Left ventricular hypertrophy No significant changes since previous, tw changes lateral leads seen on prior Confirmed by Dreama Longs [phone]) on 10/12/2023 11:17:40 AM  Radiology CT Head Wo Contrast Result Date: 10/12/2023 CLINICAL DATA:  Head trauma, coagulopathy (Age 52-64y) EXAM: CT HEAD WITHOUT CONTRAST TECHNIQUE: Contiguous axial images were obtained from the base of the skull through the vertex without intravenous contrast. RADIATION DOSE REDUCTION: This exam was performed according to the departmental dose-optimization program which includes automated exposure control, adjustment of the mA and/or kV according to patient size and/or use of iterative reconstruction technique. COMPARISON:  None Available. FINDINGS: Brain: No hemorrhage. No hydrocephalus. No extra-axial fluid collection. No mass effect. No mass lesion. No CT evidence of an acute cortical infarct. There is a background of mild chronic microvascular ischemic change. Vascular: No hyperdense vessel or unexpected calcification. Skull: Normal. Negative for fracture or focal lesion. Sinuses/Orbits: No middle ear or mastoid effusion. Pansinus  mucosal thickening. Orbits are unremarkable. Other: None. IMPRESSION: No CT evidence of intracranial injury. Electronically Signed   By: Lyndall Gore M.D.   On: 10/12/2023 12:56   DG Chest Port 1 View Result Date: 10/12/2023 CLINICAL DATA:  Questionable sepsis EXAM: PORTABLE CHEST 1 VIEW COMPARISON:  09/05/2022 FINDINGS: Cardiomegaly. Prior CABG. Low lung volumes. There is no edema, consolidation, effusion, or pneumothorax. Artifact from EKG leads. IMPRESSION: Low volume chest without acute or focal finding. Electronically Signed   By: Dorn Roulette M.D.   On: 10/12/2023 10:20    Procedures Procedures    Medications Ordered in ED Medications  lactated ringers  infusion ( Intravenous Rate/Dose Change 10/12/23 2213)  nirmatrelvir /ritonavir  (renal dosing) (PAXLOVID ) 2 tablet (2 tablets Oral Given 10/12/23 2210)  aspirin  EC tablet 81 mg (has no administration in time range)  atorvastatin  (LIPITOR ) tablet 80 mg (has no administration in time range)  metoprolol  tartrate (LOPRESSOR ) tablet 25 mg (25 mg Oral Given 10/12/23 2017)  cefTRIAXone  (ROCEPHIN ) 2 g in sodium chloride  0.9 % 100 mL IVPB (has no administration in time range)  azithromycin  (ZITHROMAX ) 500 mg in sodium chloride  0.9 % 250 mL IVPB (has no administration in time range)  enoxaparin  (LOVENOX ) injection 60 mg (60 mg Subcutaneous Given 10/12/23 2209)  acetaminophen  (TYLENOL ) tablet 650 mg (has no administration in time range)    Or  acetaminophen  (TYLENOL ) suppository 650 mg (has no administration in time range)  albuterol  (PROVENTIL ) (2.5 MG/3ML) 0.083% nebulizer solution 2.5 mg (2.5 mg Nebulization Given 10/12/23 2121)  melatonin tablet 3 mg (3 mg Oral Given 10/12/23 2209)  ascorbic acid  (VITAMIN C ) tablet 500 mg (500 mg Oral Given 10/12/23 2208)  lactated ringers  bolus 2,000 mL (0 mLs Intravenous Stopped 10/12/23 1142)  cefTRIAXone  (ROCEPHIN ) 2 g in sodium chloride  0.9 % 100 mL IVPB (0 g Intravenous Stopped 10/12/23 1050)  azithromycin  (ZITHROMAX )  500 mg in sodium chloride  0.9 % 250 mL IVPB (0 mg Intravenous Stopped 10/12/23 1212)    ED Course/ Medical Decision Making/ A&P  78 year old male with a history of persistent atrial fibrillation, coronary artery disease status post coronary artery bypass graft x 3, OSA, hyperlipidemia, hypertension, presents with concern for cough, hypotension, near syncope.  DDx includes pneumonia, dehydration, cardia arrhythmia, PE, ACS, hemorrhage, pneumonia, sepsis.   Labs completed and personally about interpreted by me are significant for creatinine of 1.9 from 0.97 previously, mild hyponatremia.  His lactic acid is 2.4, this may be in setting of sepsis, but also may be secondary to dehydration in the setting of not eating or drinking much over the last 5 days.  His troponin is within normal limits.  No sign of anemia or leukocytosis  Chest x-ray completed and evaluated by me and radiology shows no focal findings, low volume chest x-ray.  Given hypotension on arrival and report of cough, ordered antibiotics and fluid for possible sepsis.  Do think that hypotension may also be caused by decreased p.o. intake and dehydration.  Blood pressures improved with fluids.    COVID 19 testing positive. Is out of window for paxlovid .    Suspect COVID 19 related decreased appetite leading to AKI, hypotension and syncope.  No dyspnea or CP and doubt ACS/PE, and troponin negative.    Will admit for continued observation and hydration.   CT head completed given fall/syncope on anticoagulation shows no evidence of ICH. Denies other injuries from fall.          Final Clinical Impression(s) / ED Diagnoses Final diagnoses:  AKI (acute kidney injury) (HCC)  COVID-19  Dehydration  Hypotension, unspecified hypotension type  Syncope, unspecified syncope type    Rx / DC Orders ED Discharge Orders     None         Dreama Longs, MD 10/12/23 2235

## 2023-10-12 NOTE — Assessment & Plan Note (Signed)
 Has alfluzasin, but states he doesn't take this.

## 2023-10-12 NOTE — H&P (Signed)
 History and Physical    Patient: Hector Edwards FMW:[id number] DOB: 1946/01/30 DOA: 10/12/2023 DOS: the patient was seen and examined on 10/12/2023 PCP: [name], FNP  Patient coming from:  [facility]  - lives alone. Ambulates independently.    Chief Complaint: cough, dizziness, near syncope   HPI: Hector Edwards is a 78 y.o. male with medical history significant of CAD s/p CABG x3, afib s/p cardioversion on 08/08/22, HTN, HLD, GERD, BPH who presented to Ed with three day history of cough, dizziness and near syncopal event. He went to the beach on 12/31 and came back New Years Day. He started to feel bad on 1/1 with URI type symptoms. He was fatigued with runny nose, sneezing. He had to sit down a few times while on his walk that day.  He states he feels like his cough progressed. It is dry. He doesn't feel short of breath. He has been taking sudafed and something else. He has not been eating or drinking well since New years Eve. Last ate something New Year's Eve. He states he has normal urine output. He denies any fevers, N/V/D. He denies any sick contacts. He states he has had some mild dizziness with positional changes.   He was playing a slot machine this morning and he bent down to pick up something on the floor and when he stood up and sat back down his chair flew out and he fell on the floor. The attendant called EMS.    Denies any fever/chills, vision changes/headaches, chest pain or palpitations, shortness of breath, abdominal pain, N/V/D, dysuria or leg swelling.    He does not smoke, drink a few beers/week. Not daily.   ER Course:  vitals: afebrile, bp: 84/43>131/75, HR; 95, RR: 26, oxygen : 98%RA Pertinent labs: sodium 132, creatinine: 1.91, lactic acid: 2.4>1.9, +covid, troponin wnl x 2  CXR: no acute finding  CT head: no acute finding  In ED: code sepsis initially called. Given 2L IVF bolus and IVF, rocephin  and azithromycin . BC obtained. TRH asked to admit.     Review of Systems: As mentioned in the history of present illness. All other systems reviewed and are negative. Past Medical History:  Diagnosis Date   Depression    GERD (gastroesophageal reflux disease)    Hx of adenomatous colonic polyps 09/04/2015   Hyperlipidemia    Hypertension    LBP (low back pain)    OSA (obstructive sleep apnea) 08/01/2018   Sleep apnea    wears cpap    Past Surgical History:  Procedure Laterality Date   CARDIOVERSION N/A 08/08/2022   Procedure: CARDIOVERSION;  Surgeon: Raford Riggs, MD;  Location: [facility] ENDOSCOPY;  Service: Cardiovascular;  Laterality: N/A;   COLONOSCOPY  2004, 2021   hyperplastic polyps x 2   CORONARY ARTERY BYPASS GRAFT N/A 08/03/2022   Procedure: CORONARY ARTERY BYPASS GRAFTING (CABG) 3X, USING RIGHT GREATER SAPHENOUS VEIN AND LEFT INTERNAL MAMMARY ARTERY.;  Surgeon: Lucas Dorise POUR, MD;  Location: [facility] OR;  Service: Open Heart Surgery;  Laterality: N/A;   KNEE ARTHROSCOPY Right    LEFT HEART CATH AND CORONARY ANGIOGRAPHY N/A 08/01/2022   Procedure: LEFT HEART CATH AND CORONARY ANGIOGRAPHY;  Surgeon: Verlin Lonni BIRCH, MD;  Location: [facility] INVASIVE CV LAB;  Service: Cardiovascular;  Laterality: N/A;   PENILE PROSTHESIS IMPLANT N/A 01/25/2020   Procedure: PENILE PROTHESIS INFLATABLE COLOPLAST;  Surgeon: Carolee Sherwood BIRCH DOUGLAS, MD;  Location: [facility];  Service: Urology;  Laterality: N/A;   SHOULDER  ARTHROSCOPY W/ ROTATOR CUFF REPAIR Left    TEE WITHOUT CARDIOVERSION N/A 08/03/2022   Procedure: TRANSESOPHAGEAL ECHOCARDIOGRAM (TEE);  Surgeon: Lucas Dorise POUR, MD;  Location: [facility] OR;  Service: Open Heart Surgery;  Laterality: N/A;   TEE WITHOUT CARDIOVERSION N/A 08/08/2022   Procedure: TRANSESOPHAGEAL ECHOCARDIOGRAM (TEE);  Surgeon: Raford Riggs, MD;  Location: [facility] ENDOSCOPY;  Service: Cardiovascular;  Laterality: N/A;   TONSILLECTOMY  as child   vocal cord growth removed  age 53   benign   WISDOM TOOTH EXTRACTION      Social History:  reports that he has never smoked. He has never used smokeless tobacco. He reports that he does not currently use alcohol after a past usage of about 4.0 standard drinks of alcohol per week. He reports that he does not use drugs.  Allergies  Allergen Reactions   Hctz [Hydrochlorothiazide] Shortness Of Breath   Enalapril Maleate     cramps   Tribenzor [Olmesartan -Amlodipine -Hctz] Swelling    Fatigue, CP, edema   Verapamil      Fatigue    Family History  Problem Relation Age of Onset   Stroke Mother 75   Depression Mother    Hypertension Father    Heart disease Father    Colon polyps Father    Pancreatic cancer Brother    Colon polyps Brother    Throat cancer Brother    Breast cancer Daughter    Hypertension Other    Colon cancer Neg Hx    Esophageal cancer Neg Hx    Stomach cancer Neg Hx    Rectal cancer Neg Hx     Prior to Admission medications   Medication Sig Start Date End Date Taking? Authorizing Provider  alfuzosin  (UROXATRAL ) 10 MG 24 hr tablet Take 1 tablet (10 mg total) by mouth daily with breakfast. 10/07/23   Webb, Padonda B, FNP  aspirin  EC 81 MG tablet Take 1 tablet (81 mg total) by mouth daily with breakfast. Swallow whole. 06/26/23   Amon Aloysius BRAVO, MD  atorvastatin  (LIPITOR ) 80 MG tablet Take 1 tablet (80 mg total) by mouth daily with breakfast. 08/14/23   Verlin Lonni BIRCH, MD  benazepril  (LOTENSIN ) 20 MG tablet Take 1 tablet (20 mg total) by mouth daily with supper. 08/14/23   Verlin Lonni BIRCH, MD  diltiazem  (CARDIZEM  CD) 180 MG 24 hr capsule Take 1 capsule (180 mg total) by mouth every morning. 08/14/23   Verlin Lonni BIRCH, MD  metoprolol  tartrate (LOPRESSOR ) 25 MG tablet Take 1 tablet (25 mg total) by mouth 2 (two) times daily with morning and evening meals 08/14/23   Verlin Lonni BIRCH, MD  pantoprazole  (PROTONIX ) 40 MG tablet Take 1 tablet (40 mg total) by mouth every evening. Please make appointment for refills 09/19/23    [name], FNP    Physical Exam: Vitals:   10/12/23 1514 10/12/23 1515 10/12/23 1530 10/12/23 1622  BP: 122/70 115/66 96/80 131/75  Pulse:  95 98 100  Resp: 15 19 18 18   Temp: 98.7 F (37.1 C)   98.2 F (36.8 C)  TempSrc:    Oral  SpO2:  99% 99% 98%  Weight:      Height:       General:  Appears calm and comfortable and is in NAD Eyes:  PERRL, EOMI, normal lids, iris ENT:  HOH, lips & tongue, mmm; appropriate dentition Neck:  no LAD, masses or thyromegaly; no carotid bruits Cardiovascular:  RRR, +systolic murmur. No LE edema.  Respiratory:   CTA bilaterally  with no wheezes/rales/rhonchi.  Normal respiratory effort. Abdomen:  soft, NT, ND, NABS Back:   normal alignment, no CVAT Skin:  no rash or induration seen on limited exam Musculoskeletal:  grossly normal tone BUE/BLE, good ROM, no bony abnormality Lower extremity:  No LE edema.  Limited foot exam with no ulcerations.  2+ distal pulses. Psychiatric:  grossly normal mood and affect, speech fluent and appropriate, AOx3 Neurologic:  CN 2-12 grossly intact, moves all extremities in coordinated fashion, sensation intact   Radiological Exams on Admission: Independently reviewed - see discussion in A/P where applicable  CT Head Wo Contrast Result Date: 10/12/2023 CLINICAL DATA:  Head trauma, coagulopathy (Age 37-64y) EXAM: CT HEAD WITHOUT CONTRAST TECHNIQUE: Contiguous axial images were obtained from the base of the skull through the vertex without intravenous contrast. RADIATION DOSE REDUCTION: This exam was performed according to the departmental dose-optimization program which includes automated exposure control, adjustment of the mA and/or kV according to patient size and/or use of iterative reconstruction technique. COMPARISON:  None Available. FINDINGS: Brain: No hemorrhage. No hydrocephalus. No extra-axial fluid collection. No mass effect. No mass lesion. No CT evidence of an acute cortical infarct. There is a  background of mild chronic microvascular ischemic change. Vascular: No hyperdense vessel or unexpected calcification. Skull: Normal. Negative for fracture or focal lesion. Sinuses/Orbits: No middle ear or mastoid effusion. Pansinus mucosal thickening. Orbits are unremarkable. Other: None. IMPRESSION: No CT evidence of intracranial injury. Electronically Signed   By: Lyndall Gore M.D.   On: 10/12/2023 12:56   DG Chest Port 1 View Result Date: 10/12/2023 CLINICAL DATA:  Questionable sepsis EXAM: PORTABLE CHEST 1 VIEW COMPARISON:  09/05/2022 FINDINGS: Cardiomegaly. Prior CABG. Low lung volumes. There is no edema, consolidation, effusion, or pneumothorax. Artifact from EKG leads. IMPRESSION: Low volume chest without acute or focal finding. Electronically Signed   By: Dorn Roulette M.D.   On: 10/12/2023 10:20    EKG: Independently reviewed.  NSR with rate 95; nonspecific ST changes with no evidence of acute ischemia   Labs on Admission: I have personally reviewed the available labs and imaging studies at the time of the admission.  Pertinent labs:   sodium 132,  creatinine: 1.91,  lactic acid: 2.4>1.9,  +covid  troponin wnl x 2   Assessment and Plan: Principal Problem:   AKI (acute kidney injury) (HCC) Active Problems:   COVID-19 virus infection   Hyponatremia   Paroxysmal atrial fibrillation (HCC)   S/P CABG x 3   Essential hypertension   GERD (gastroesophageal reflux disease)   Benign prostatic hyperplasia with nocturia   OSA (obstructive sleep apnea)    Assessment and Plan: * AKI (acute kidney injury) (HCC) 78 year old presenting to ED with complaints of near syncope after bending over with hx of URI x 4 days with poor PO intake found to have Covid and and new AKI with creatinine of 1.9. creatinine baseline normal (<1.0) -admit to tele  -likely pre renal in setting of poor PO intake x 4 days with ACE-I insult and hypotension  -UA with no hgb -received IVF in ED, continue x 24  hours -strict I/o -avoid nephrotoxic drugs -maintain MAP >65 -trend   COVID-19 virus infection Covid 19 order set utilized PPE and contact/airborne precautions From a respiratory/covid standpoint he looks good. Lungs clear, oxygen  99% on room air and no shortness of breath Symptoms <5 days, start renally dosed paxlovid   No indication for steroids Albuterol  PRN  Neutroceuticals  IS to bedside and encouraged  to use hourly  Given rocephin /azith in ED. PCT .46, will continue for now. Trend PCT   Hyponatremia Sodium 132. Likely hypovolemic hyponatremia in setting of poor PO intake x 4 days Continue IVF Trend If no improvement, check urine studies   Paroxysmal atrial fibrillation (HCC) Episode of afib post op Cardioversion 08/08/2022 Cardiac monitor in March 2024 with sinus. His atrial fib occurred post-op.  No known recurrence over last six months.  Eliquis  stopped by cardiology 01/2023  Continue cardizem  and lopressor  if bp allows  S/P CABG x 3 S/p 3V CABG in 07/2022 EKG with no significant changes Troponin wnl x 2 Continue medical management   Essential hypertension Hypotensive on arrival to ED, but responded to IVF and now mildly HTN Will start back his lopressor  only tonight. Hold lotensin  in setting of AKI  Continue rest of home medication tomorrow if BP allows Check orthostatics, TED hose  On lotensin  20mg  daily, cardizem  180mg  and lopressor  25mg  BID   GERD (gastroesophageal reflux disease) Has protonix , but states he doesn't use this   Benign prostatic hyperplasia with nocturia Has alfluzasin, but states he doesn't take this.   OSA (obstructive sleep apnea) Continue cpap at night  He states he doesn't wear, it drives me crazy.      Advance Care Planning:   Code Status: Full Code   Consults: none   DVT Prophylaxis: lovenox    Family Communication: none   Severity of Illness: The appropriate patient status for this patient is INPATIENT. Inpatient  status is judged to be reasonable and necessary in order to provide the required intensity of service to ensure the patient's safety. The patient's presenting symptoms, physical exam findings, and initial radiographic and laboratory data in the context of their chronic comorbidities is felt to place them at high risk for further clinical deterioration. Furthermore, it is not anticipated that the patient will be medically stable for discharge from the hospital within 2 midnights of admission.   * I certify that at the point of admission it is my clinical judgment that the patient will require inpatient hospital care spanning beyond 2 midnights from the point of admission due to high intensity of service, high risk for further deterioration and high frequency of surveillance required.*  Author: Isaiah Geralds, MD 10/12/2023 6:09 PM  For on call review [email or web address].

## 2023-10-12 NOTE — ED Notes (Signed)
 Patient transported to CT

## 2023-10-12 NOTE — Assessment & Plan Note (Addendum)
 78 year old presenting to ED with complaints of near syncope after bending over with hx of URI x 4 days with poor PO intake found to have Covid and and new AKI with creatinine of 1.9. creatinine baseline normal (<1.0) -admit to tele  -likely pre renal in setting of poor PO intake x 4 days with ACE-I insult and hypotension  -UA with no hgb -received IVF in ED, continue x 24 hours -strict I/o -avoid nephrotoxic drugs -maintain MAP >65 -trend

## 2023-10-12 NOTE — Assessment & Plan Note (Addendum)
 Has protonix, but states he doesn't use this

## 2023-10-12 NOTE — Assessment & Plan Note (Addendum)
 Hypotensive on arrival to ED, but responded to IVF and now mildly HTN Will start back his lopressor  only tonight. Hold lotensin  in setting of AKI  Continue rest of home medication tomorrow if BP allows Check orthostatics, TED hose  On lotensin  20mg  daily, cardizem  180mg  and lopressor  25mg  BID

## 2023-10-12 NOTE — ED Notes (Signed)
 Fall risk armband Fall risk sign on door  Fall risk socks

## 2023-10-12 NOTE — ED Notes (Signed)
 ED TO INPATIENT HANDOFF REPORT  ED Nurse Name and Phone #: R. Rosana, RN [phone]  S Name/Age/Gender Hector Edwards 78 y.o. male Room/Bed: MH10/MH10  Code Status   Code Status: Prior  Home/SNF/Other Home Patient oriented to: self, place, time, and situation Is this baseline? Yes   Triage Complete: Triage complete  Chief Complaint AKI (acute kidney injury) (HCC) [N17.9]  Triage Note Pt arrived via EMS. EMS reports pt at [facility] and had 2 episodes of near syncope and fell off stool . Pt reports stool shot out from under him causing him to fall out of chair. Denies hitting head.Pt doesn't remember if he passed out but was told he did. Currently on blood thinners. Not eating well. Coughing for approx 1 week. Pt endorses he returned from the beach on wednesday   Allergies Allergies  Allergen Reactions   Hctz [Hydrochlorothiazide] Shortness Of Breath   Enalapril Maleate     cramps   Tribenzor [Olmesartan -Amlodipine -Hctz] Swelling    Fatigue, CP, edema   Verapamil      Fatigue    Level of Care/Admitting Diagnosis ED Disposition     ED Disposition  Admit   Condition  --   Comment  Hospital Area: [facility] [[id number]]  Level of Care: Progressive [102]  Admit to Progressive based on following criteria: MULTISYSTEM THREATS such as stable sepsis, metabolic/electrolyte imbalance with or without encephalopathy that is responding to early treatment.  May admit patient to [facility] or [facility] if equivalent level of care is available:: Yes  Interfacility transfer: Yes  Covid Evaluation: Confirmed COVID Positive  Diagnosis: AKI (acute kidney injury) [facility]) [[id number]]  Admitting Physician: DAVIA NYDIA POUR [[id number]]  Attending Physician: RAI, RIPUDEEP K [[id number]]  Certification:: I certify this patient will need inpatient services for at least 2 midnights  Expected Medical Readiness: 10/14/2023          B Medical/Surgery History Past Medical History:   Diagnosis Date   Depression    GERD (gastroesophageal reflux disease)    Hx of adenomatous colonic polyps 09/04/2015   Hyperlipidemia    Hypertension    LBP (low back pain)    OSA (obstructive sleep apnea) 08/01/2018   Sleep apnea    wears cpap    Past Surgical History:  Procedure Laterality Date   CARDIOVERSION N/A 08/08/2022   Procedure: CARDIOVERSION;  Surgeon: Raford Riggs, MD;  Location: [facility] ENDOSCOPY;  Service: Cardiovascular;  Laterality: N/A;   COLONOSCOPY  2004, 2021   hyperplastic polyps x 2   CORONARY ARTERY BYPASS GRAFT N/A 08/03/2022   Procedure: CORONARY ARTERY BYPASS GRAFTING (CABG) 3X, USING RIGHT GREATER SAPHENOUS VEIN AND LEFT INTERNAL MAMMARY ARTERY.;  Surgeon: Lucas Dorise POUR, MD;  Location: [facility] OR;  Service: Open Heart Surgery;  Laterality: N/A;   KNEE ARTHROSCOPY Right    LEFT HEART CATH AND CORONARY ANGIOGRAPHY N/A 08/01/2022   Procedure: LEFT HEART CATH AND CORONARY ANGIOGRAPHY;  Surgeon: Verlin Lonni BIRCH, MD;  Location: [facility] INVASIVE CV LAB;  Service: Cardiovascular;  Laterality: N/A;   PENILE PROSTHESIS IMPLANT N/A 01/25/2020   Procedure: PENILE PROTHESIS INFLATABLE COLOPLAST;  Surgeon: Carolee Sherwood BIRCH DOUGLAS, MD;  Location: [facility] [place];  Service: Urology;  Laterality: N/A;   SHOULDER ARTHROSCOPY W/ ROTATOR CUFF REPAIR Left    TEE WITHOUT CARDIOVERSION N/A 08/03/2022   Procedure: TRANSESOPHAGEAL ECHOCARDIOGRAM (TEE);  Surgeon: Lucas Dorise POUR, MD;  Location: [facility] OR;  Service: Open Heart Surgery;  Laterality: N/A;   TEE WITHOUT CARDIOVERSION N/A 08/08/2022  Procedure: TRANSESOPHAGEAL ECHOCARDIOGRAM (TEE);  Surgeon: Raford Riggs, MD;  Location: [facility] ENDOSCOPY;  Service: Cardiovascular;  Laterality: N/A;   TONSILLECTOMY  as child   vocal cord growth removed  age 56   benign   WISDOM TOOTH EXTRACTION       A IV Location/Drains/Wounds Patient Lines/Drains/Airways Status     Active Line/Drains/Airways     Name Placement date Placement  time Site Days   Peripheral IV 10/12/23 20 G 1 Left;Medial Antecubital 10/12/23  0935  Antecubital  less than 1   Peripheral IV 10/12/23 20 G Posterior;Proximal;Right Forearm 10/12/23  1012  Forearm  less than 1            Intake/Output Last 24 hours  Intake/Output Summary (Last 24 hours) at 10/12/2023 1444 Last data filed at 10/12/2023 1212 Gross per 24 hour  Intake 2387.95 ml  Output --  Net 2387.95 ml    Labs/Imaging Results for orders placed or performed during the hospital encounter of 10/12/23 (from the past 48 hours)  Lactic acid, plasma     Status: Abnormal   Collection Time: 10/12/23  9:31 AM  Result Value Ref Range   Lactic Acid, Venous 2.4 (HH) 0.5 - 1.9 mmol/L    Comment: CRITICAL RESULT CALLED TO, READ BACK BY AND VERIFIED WITH MELFORD RAMAN RN 1012 [phone] PHILLIPS C Performed at [facility], [address] [place], [place] [address]   Comprehensive metabolic panel     Status: Abnormal   Collection Time: 10/12/23  9:31 AM  Result Value Ref Range   Sodium 132 (L) 135 - 145 mmol/L   Potassium 4.3 3.5 - 5.1 mmol/L   Chloride 98 98 - 111 mmol/L   CO2 24 22 - 32 mmol/L   Glucose, Bld 150 (H) 70 - 99 mg/dL    Comment: Glucose reference range applies only to samples taken after fasting for at least 8 hours.   BUN 13 8 - 23 mg/dL   Creatinine, Ser 8.08 (H) 0.61 - 1.24 mg/dL   Calcium  8.8 (L) 8.9 - 10.3 mg/dL   Total Protein 7.4 6.5 - 8.1 g/dL   Albumin  3.9 3.5 - 5.0 g/dL   AST 26 15 - 41 U/L   ALT 20 0 - 44 U/L   Alkaline Phosphatase 66 38 - 126 U/L   Total Bilirubin 1.2 0.0 - 1.2 mg/dL   GFR, Estimated 36 (L) >60 mL/min    Comment: (NOTE) Calculated using the CKD-EPI Creatinine Equation (2021)    Anion gap 10 5 - 15    Comment: Performed at [facility], [address] [place], [place] [address]  CBC with Differential     Status: Abnormal   Collection Time: 10/12/23  9:31 AM  Result Value Ref Range   WBC 6.5 4.0 - 10.5 K/uL   RBC 4.33  4.22 - 5.81 MIL/uL   Hemoglobin 14.5 13.0 - 17.0 g/dL   HCT 57.3 60.9 - 47.9 %   MCV 98.4 80.0 - 100.0 fL   MCH 33.5 26.0 - 34.0 pg   MCHC 34.0 30.0 - 36.0 g/dL   RDW 85.8 88.4 - 84.4 %   Platelets 156 150 - 400 K/uL   nRBC 0.0 0.0 - 0.2 %   Neutrophils Relative % 56 %   Neutro Abs 3.7 1.7 - 7.7 K/uL   Lymphocytes Relative 15 %   Lymphs Abs 0.9 0.7 - 4.0 K/uL   Monocytes Relative 25 %   Monocytes Absolute  1.6 (H) 0.1 - 1.0 K/uL   Eosinophils Relative 1 %   Eosinophils Absolute 0.1 0.0 - 0.5 K/uL   Basophils Relative 0 %   Basophils Absolute 0.0 0.0 - 0.1 K/uL   Immature Granulocytes 3 %   Abs Immature Granulocytes 0.19 (H) 0.00 - 0.07 K/uL    Comment: Performed at [facility], [address] [place], [place] [address]  Protime-INR     Status: None   Collection Time: 10/12/23  9:31 AM  Result Value Ref Range   Prothrombin Time 13.7 11.4 - 15.2 seconds   INR 1.0 0.8 - 1.2    Comment: (NOTE) INR goal varies based on device and disease states. Performed at [facility], [address] [place], [place] [address]   APTT     Status: None   Collection Time: 10/12/23  9:31 AM  Result Value Ref Range   aPTT 32 24 - 36 seconds    Comment: Performed at [facility], [address] [place], [place] [address]  Troponin I (High Sensitivity)     Status: None   Collection Time: 10/12/23  9:31 AM  Result Value Ref Range   Troponin I (High Sensitivity) 17 <18 ng/L    Comment: (NOTE) Elevated high sensitivity troponin I (hsTnI) values and significant  changes across serial measurements may suggest ACS but many other  chronic and acute conditions are known to elevate hsTnI results.  Refer to the Links section for chest pain algorithms and additional  guidance. Performed at [facility], [address] [place], [place] [address]   CBG monitoring, ED     Status: Abnormal   Collection Time: 10/12/23  9:55 AM  Result Value Ref Range    Glucose-Capillary 147 (H) 70 - 99 mg/dL    Comment: Glucose reference range applies only to samples taken after fasting for at least 8 hours.   Comment 1 Notify RN   Resp panel by RT-PCR (RSV, Flu A&B, Covid) Anterior Nasal Swab     Status: Abnormal   Collection Time: 10/12/23 10:25 AM   Specimen: Anterior Nasal Swab  Result Value Ref Range   SARS Coronavirus 2 by RT PCR POSITIVE (A) NEGATIVE    Comment: (NOTE) SARS-CoV-2 target nucleic acids are DETECTED.  The SARS-CoV-2 RNA is generally detectable in upper respiratory specimens during the acute phase of infection. Positive results are indicative of the presence of the identified virus, but do not rule out bacterial infection or co-infection with other pathogens not detected by the test. Clinical correlation with patient history and other diagnostic information is necessary to determine patient infection status. The expected result is Negative.  Fact Sheet for Patients: bloggercourse.com  Fact Sheet for Healthcare Providers: seriousbroker.it  This test is not yet approved or cleared by the [place]  FDA and  has been authorized for detection and/or diagnosis of SARS-CoV-2 by FDA under an Emergency Use Authorization (EUA).  This EUA will remain in effect (meaning this test can be used) for the duration of  the COVID-19 declaration under Section 564(b)(1) of the A ct, 21 U.S.C. section 360bbb-3(b)(1), unless the authorization is terminated or revoked sooner.     Influenza A by PCR NEGATIVE NEGATIVE   Influenza B by PCR NEGATIVE NEGATIVE    Comment: (NOTE) The Xpert Xpress SARS-CoV-2/FLU/RSV plus assay is intended as an aid in the diagnosis of influenza from Nasopharyngeal swab specimens and should not be used as  a sole basis for treatment. Nasal washings and aspirates are unacceptable for Xpert Xpress SARS-CoV-2/FLU/RSV testing.  Fact Sheet for  Patients: bloggercourse.com  Fact Sheet for Healthcare Providers: seriousbroker.it  This test is not yet approved or cleared by the [place]  FDA and has been authorized for detection and/or diagnosis of SARS-CoV-2 by FDA under an Emergency Use Authorization (EUA). This EUA will remain in effect (meaning this test can be used) for the duration of the COVID-19 declaration under Section 564(b)(1) of the Act, 21 U.S.C. section 360bbb-3(b)(1), unless the authorization is terminated or revoked.     Resp Syncytial Virus by PCR NEGATIVE NEGATIVE    Comment: (NOTE) Fact Sheet for Patients: bloggercourse.com  Fact Sheet for Healthcare Providers: seriousbroker.it  This test is not yet approved or cleared by the [place]  FDA and has been authorized for detection and/or diagnosis of SARS-CoV-2 by FDA under an Emergency Use Authorization (EUA). This EUA will remain in effect (meaning this test can be used) for the duration of the COVID-19 declaration under Section 564(b)(1) of the Act, 21 U.S.C. section 360bbb-3(b)(1), unless the authorization is terminated or revoked.  Performed at [facility], [address] [place], [place] [address]   Lactic acid, plasma     Status: None   Collection Time: 10/12/23 11:54 AM  Result Value Ref Range   Lactic Acid, Venous 1.9 0.5 - 1.9 mmol/L    Comment: Performed at [facility], [address] [place], [place] [address]  Troponin I (High Sensitivity)     Status: None   Collection Time: 10/12/23 11:54 AM  Result Value Ref Range   Troponin I (High Sensitivity) 10 <18 ng/L    Comment: (NOTE) Elevated high sensitivity troponin I (hsTnI) values and significant  changes across serial measurements may suggest ACS but many other  chronic and acute conditions are known to elevate hsTnI results.  Refer to the Links section  for chest pain algorithms and additional  guidance. Performed at [facility], [address] [place], [place] [address]   Urinalysis, w/ Reflex to Culture (Infection Suspected) -Urine, Clean Catch     Status: Abnormal   Collection Time: 10/12/23 12:08 PM  Result Value Ref Range   Specimen Source URINE, CLEAN CATCH    Color, Urine YELLOW YELLOW   APPearance CLEAR CLEAR   Specific Gravity, Urine <=1.005 1.005 - 1.030   pH 5.5 5.0 - 8.0   Glucose, UA NEGATIVE NEGATIVE mg/dL   Hgb urine dipstick NEGATIVE NEGATIVE   Bilirubin Urine NEGATIVE NEGATIVE   Ketones, ur NEGATIVE NEGATIVE mg/dL   Protein, ur 30 (A) NEGATIVE mg/dL   Nitrite NEGATIVE NEGATIVE   Leukocytes,Ua NEGATIVE NEGATIVE   Squamous Epithelial / HPF 0-5 0 - 5 /HPF   WBC, UA 0-5 0 - 5 WBC/hpf    Comment: Reflex urine culture not performed if WBC <=10, OR if Squamous epithelial cells >5. If Squamous epithelial cells >5, suggest recollection.   RBC / HPF NONE SEEN 0 - 5 RBC/hpf   Bacteria, UA FEW (A) NONE SEEN   Mucus PRESENT    Hyaline Casts, UA PRESENT     Comment: Performed at [facility], [address] [place], [place] [address]   CT Head Wo Contrast Result Date: 10/12/2023 CLINICAL DATA:  Head trauma, coagulopathy (Age 32-64y) EXAM: CT HEAD WITHOUT CONTRAST TECHNIQUE: Contiguous axial images were obtained from the base of the skull through the vertex without intravenous  contrast. RADIATION DOSE REDUCTION: This exam was performed according to the departmental dose-optimization program which includes automated exposure control, adjustment of the mA and/or kV according to patient size and/or use of iterative reconstruction technique. COMPARISON:  None Available. FINDINGS: Brain: No hemorrhage. No hydrocephalus. No extra-axial fluid collection. No mass effect. No mass lesion. No CT evidence of an acute cortical infarct. There is a background of mild chronic microvascular ischemic change. Vascular: No  hyperdense vessel or unexpected calcification. Skull: Normal. Negative for fracture or focal lesion. Sinuses/Orbits: No middle ear or mastoid effusion. Pansinus mucosal thickening. Orbits are unremarkable. Other: None. IMPRESSION: No CT evidence of intracranial injury. Electronically Signed   By: Lyndall Gore M.D.   On: 10/12/2023 12:56   DG Chest Port 1 View Result Date: 10/12/2023 CLINICAL DATA:  Questionable sepsis EXAM: PORTABLE CHEST 1 VIEW COMPARISON:  09/05/2022 FINDINGS: Cardiomegaly. Prior CABG. Low lung volumes. There is no edema, consolidation, effusion, or pneumothorax. Artifact from EKG leads. IMPRESSION: Low volume chest without acute or focal finding. Electronically Signed   By: Dorn Roulette M.D.   On: 10/12/2023 10:20    Pending Labs Unresulted Labs (From admission, onward)     Start     Ordered   10/12/23 1343  C-reactive protein  ONCE - URGENT,   URGENT        10/12/23 1342   10/12/23 1343  Procalcitonin  ONCE - URGENT,   URGENT       References:    Procalcitonin Lower Respiratory Tract Infection AND Sepsis Procalcitonin Algorithm   10/12/23 1342   10/12/23 0943  Blood Culture (routine x 2)  (Septic presentation on arrival (screening labs, nursing and treatment orders for obvious sepsis))  BLOOD CULTURE X 2,   STAT      10/12/23 0944            Vitals/Pain Today's Vitals   10/12/23 1159 10/12/23 1200 10/12/23 1230 10/12/23 1324  BP:  115/65 117/64   Pulse: 96 97 95 100  Resp: (!) 21 18 (!) 22 (!) 21  Temp: 98.8 F (37.1 C)  98.8 F (37.1 C)   SpO2: 98% 97% 97% 93%  Weight:      Height:      PainSc:        Isolation Precautions Airborne precautions  Medications Medications  lactated ringers  infusion ( Intravenous New Bag/Given 10/12/23 1159)  lactated ringers  bolus 2,000 mL (0 mLs Intravenous Stopped 10/12/23 1142)  cefTRIAXone  (ROCEPHIN ) 2 g in sodium chloride  0.9 % 100 mL IVPB (0 g Intravenous Stopped 10/12/23 1050)  azithromycin  (ZITHROMAX ) 500 mg in  sodium chloride  0.9 % 250 mL IVPB (0 mg Intravenous Stopped 10/12/23 1212)    Mobility walks     Focused Assessments Covid +, AKI   R Recommendations: See Admitting Provider Note  Report given to:   Additional Notes:

## 2023-10-12 NOTE — Assessment & Plan Note (Addendum)
 S/p 3V CABG in 07/2022 EKG with no significant changes Troponin wnl x 2 Continue medical management

## 2023-10-12 NOTE — ED Triage Notes (Addendum)
 Pt arrived via EMS. EMS reports pt at [facility] and had 2 episodes of near syncope and fell off stool . Pt reports stool shot out from under him causing him to fall out of chair. Denies hitting head.Pt doesn't remember if he passed out but was told he did. Currently on blood thinners. Not eating well. Coughing for approx 1 week. Pt endorses he returned from the beach on wednesday

## 2023-10-12 NOTE — Assessment & Plan Note (Signed)
 Episode of afib post op Cardioversion 08/08/2022 Cardiac monitor in March 2024 with sinus. His atrial fib occurred post-op.  No known recurrence over last six months.  Eliquis stopped by cardiology 01/2023  Continue cardizem and lopressor if bp allows

## 2023-10-12 NOTE — Sepsis Progress Note (Signed)
 Elink following code sepsis

## 2023-10-12 NOTE — Assessment & Plan Note (Signed)
 Sodium 132. Likely hypovolemic hyponatremia in setting of poor PO intake x 4 days Continue IVF Trend If no improvement, check urine studies

## 2023-10-12 NOTE — ED Notes (Signed)
 ED Provider at bedside.

## 2023-10-12 NOTE — ED Notes (Signed)
 Second IV established. #20 Right posterior forearm. Blood return but unable to pull enough blood for 2nd set blood cultures. EDP notified. Rocephin started so not to delay

## 2023-10-12 NOTE — ED Notes (Signed)
 [facility] into transport

## 2023-10-12 NOTE — Assessment & Plan Note (Addendum)
 Covid 19 order set utilized PPE and contact/airborne precautions From a respiratory/covid standpoint he looks good. Lungs clear, oxygen  99% on room air and no shortness of breath Symptoms <5 days, start renally dosed paxlovid   No indication for steroids Albuterol  PRN  Neutroceuticals  IS to bedside and encouraged to use hourly  Given rocephin /azith in ED. PCT .46, will continue for now. Trend PCT

## 2023-10-13 ENCOUNTER — Other Ambulatory Visit: Payer: Self-pay

## 2023-10-13 DIAGNOSIS — N179 Acute kidney failure, unspecified: Secondary | ICD-10-CM | POA: Diagnosis not present

## 2023-10-13 LAB — CBC
HCT: 37.3 % — ABNORMAL LOW (ref 39.0–52.0)
Hemoglobin: 12.4 g/dL — ABNORMAL LOW (ref 13.0–17.0)
MCH: 33.2 pg (ref 26.0–34.0)
MCHC: 33.2 g/dL (ref 30.0–36.0)
MCV: 100 fL (ref 80.0–100.0)
Platelets: 126 10*3/uL — ABNORMAL LOW (ref 150–400)
RBC: 3.73 MIL/uL — ABNORMAL LOW (ref 4.22–5.81)
RDW: 14.3 % (ref 11.5–15.5)
WBC: 3.2 10*3/uL — ABNORMAL LOW (ref 4.0–10.5)
nRBC: 0 % (ref 0.0–0.2)

## 2023-10-13 LAB — COMPREHENSIVE METABOLIC PANEL
ALT: 16 U/L (ref 0–44)
AST: 18 U/L (ref 15–41)
Albumin: 3.2 g/dL — ABNORMAL LOW (ref 3.5–5.0)
Alkaline Phosphatase: 53 U/L (ref 38–126)
Anion gap: 7 (ref 5–15)
BUN: 11 mg/dL (ref 8–23)
CO2: 25 mmol/L (ref 22–32)
Calcium: 8.2 mg/dL — ABNORMAL LOW (ref 8.9–10.3)
Chloride: 102 mmol/L (ref 98–111)
Creatinine, Ser: 0.55 mg/dL — ABNORMAL LOW (ref 0.61–1.24)
GFR, Estimated: >60 mL/min (ref >60)
Glucose, Bld: 108 mg/dL — ABNORMAL HIGH (ref 70–99)
Potassium: 3.4 mmol/L — ABNORMAL LOW (ref 3.5–5.1)
Sodium: 134 mmol/L — ABNORMAL LOW (ref 135–145)
Total Bilirubin: 0.9 mg/dL (ref 0.0–1.2)
Total Protein: 6.4 g/dL — ABNORMAL LOW (ref 6.5–8.1)

## 2023-10-13 LAB — PROCALCITONIN: Procalcitonin: <0.1 ng/mL

## 2023-10-13 MED ORDER — DOXYCYCLINE HYCLATE 100 MG PO TABS: 100.0000 mg | ORAL_TABLET | Freq: Two times a day (BID) | ORAL | 0 refills | Status: AC

## 2023-10-13 MED ORDER — POTASSIUM CHLORIDE CRYS ER 20 MEQ PO TBCR
40.0000 meq | EXTENDED_RELEASE_TABLET | Freq: Once | ORAL | Status: AC
  Administered 2023-10-13: 40 meq via ORAL
  Filled 2023-10-13: qty 2

## 2023-10-13 MED ORDER — NIRMATRELVIR/RITONAVIR (PAXLOVID) TABLET (RENAL DOSING): 2.0000 | ORAL_TABLET | Freq: Two times a day (BID) | ORAL | Status: AC

## 2023-10-13 MED ORDER — ALBUTEROL SULFATE (2.5 MG/3ML) 0.083% IN NEBU
2.5000 mg | INHALATION_SOLUTION | Freq: Three times a day (TID) | RESPIRATORY_TRACT | Status: DC
  Administered 2023-10-13: 2.5 mg via RESPIRATORY_TRACT
  Filled 2023-10-13 (×2): qty 3

## 2023-10-13 MED ORDER — ALFUZOSIN HCL ER 10 MG PO TB24
10.0000 mg | ORAL_TABLET | Freq: Every day | ORAL | Status: DC
  Filled 2023-10-13: qty 1

## 2023-10-13 MED ORDER — GUAIFENESIN-DM 100-10 MG/5ML PO SYRP: 5.0000 mL | ORAL_SOLUTION | ORAL | 0 refills | Status: DC | PRN

## 2023-10-13 MED ORDER — ALFUZOSIN HCL ER 10 MG PO TB24: 10.0000 mg | ORAL_TABLET | Freq: Every day | ORAL | Status: DC

## 2023-10-13 MED ORDER — ATORVASTATIN CALCIUM 40 MG PO TABS: 80.0000 mg | ORAL_TABLET | Freq: Every day | ORAL | Status: DC

## 2023-10-13 MED ORDER — ASCORBIC ACID 500 MG PO TABS: 500.0000 mg | ORAL_TABLET | Freq: Two times a day (BID) | ORAL | 0 refills | Status: AC

## 2023-10-13 NOTE — Care Management Obs Status (Signed)
 MEDICARE OBSERVATION STATUS NOTIFICATION   Patient Details  Name: Hector Edwards MRN: [id number] Date of Birth: Mar 12, 1946   Medicare Observation Status Notification Given:  Yes    Maryjean Ka, LCSW 10/13/2023, 1:45 PM

## 2023-10-13 NOTE — Hospital Course (Signed)
 Hector Edwards is a 78 y.o. male with medical history significant of CAD s/p CABG x3, atrial fibrillation status post cardioversion on 08/08/22, HTN, HLD, GERD, BPH  presented to the ED with cough dizziness and near syncopal episode.  Patient had been having upper respiratory symptoms and had decreased appetite.  Patient stated that he was feeling slot machine the morning of presentation and fell down on the floor.  EMS was called and an initial blood pressure was noted to be low.  Labs in the ED showed mild hyponatremia with sodium of 132 with creatinine elevation at 1.9.  Initial lactate was elevated at 2.4 subsequently 1.9.  Patient tested positive for COVID.  Troponins were negative.  Chest x-ray negative for acute findings.  CT head was negative for acute findings.  Initially consultation was called in and received 2 L of IV fluid bolus including Rocephin  and Zithromax .  Blood cultures were obtained and patient was admitted hospital for further evaluation and treatment.  Assessment and plan.  AKI (acute kidney injury)  Likely secondary to poor oral intake and volume depletion.  Improved after IV fluids.  Creatinine today at 3.5 from initial 1.9.  At baseline currently.  Check BMP closely.  Intake and output charting.  Avoid nephrotoxic medications.  COVID-19 virus infection Symptoms less than 5 days.  No hypoxia.  Symptomatic care.  Procalcitonin 0.46 followed by less than 0.10.  Follow blood cultures.  Continue Paxlovid .  Mild hypokalemia.  Will replenish.  Check levels in AM.   Hyponatremia Mild.  Sodium of 134.  Improved.   Paroxysmal atrial fibrillation (HCC) Status post cardioversion  08/08/2022. Cardiac monitor in March 2024 with sinus. His atrial fib occurred post-op. No known recurrence over last six months.  Eliquis  stopped by cardiology 01/2023 .on  cardizem  and lopressor  at home.  Currently on metoprolol .   S/P CABG x 3 S/p 3V CABG in 07/2022. Troponins and EKG was unremarkable.     Essential hypertension Hypertension on hold.  Initially was hypotensive.  Responded with IV fluids.  Patient is on Lotensin , Cardizem  and Lopressor  at home.  Continue Lopressor  for now. Check orthostatics, TED hose    GERD (gastroesophageal reflux disease) Continue Protonix    Benign prostatic hyperplasia with nocturia Has alfluzasin, but states he doesn't take this.    OSA (obstructive sleep apnea) Does not like to wear his CPAP.

## 2023-10-13 NOTE — Discharge Summary (Signed)
 Physician Discharge Summary  Hector Edwards FMW:[id number] DOB: [birth year] DOA: 10/12/2023  PCP: [name], FNP  Admit date: 10/12/2023 Discharge date: 10/13/2023  Admitted From: Home  Discharge disposition: Home   Recommendations for Outpatient Follow-Up:   Follow up with your primary care provider in one week.  Check CBC, BMP, magnesium  in the next visit   Discharge Diagnosis:   Principal Problem:   AKI (acute kidney injury) (HCC) Active Problems:   COVID-19 virus infection   Hyponatremia   Paroxysmal atrial fibrillation (HCC)   S/P CABG x 3   Essential hypertension   GERD (gastroesophageal reflux disease)   Benign prostatic hyperplasia with nocturia   OSA (obstructive sleep apnea)   Discharge Condition: Improved.  Diet recommendation: Low sodium, heart healthy.    Wound care: None.  Code status: Full.   History of Present Illness:   Hector Edwards is a 78 y.o. male with medical history significant of CAD s/p CABG x3, atrial fibrillation status post cardioversion on 08/08/22, HTN, HLD, GERD, BPH  presented to the ED with cough dizziness and near syncopal episode.  Patient had been having upper respiratory symptoms and had decreased appetite.  Patient stated that he was feeling slot machine the morning of presentation and fell down on the floor.  EMS was called and an initial blood pressure was noted to be low.  Labs in the ED showed mild hyponatremia with sodium of 132 with creatinine elevation at 1.9.  Initial lactate was elevated at 2.4 subsequently 1.9.  Patient tested positive for COVID.  Troponins were negative.  Chest x-ray negative for acute findings.  CT head was negative for acute findings.  Initially consultation was called in and received 2 L of IV fluid bolus including Rocephin  and Zithromax .  Blood cultures were obtained and patient was admitted hospital for further evaluation and treatment.  Hospital Course:   Following conditions were  addressed during hospitalization as listed below,  AKI (acute kidney injury)  Likely secondary to poor oral intake and volume depletion.  Improved after IV fluids.  Creatinine today at 0.5 from initial 1.9.  At baseline currently.  Encouraged oral hydration on discharge.  COVID-19 virus infection Symptoms less than 5 days.  No hypoxia.    Procalcitonin 0.46 followed by less than 0.10.  Blood cultures negative in less than 24 hours.  Patient was started on Paxlovid  which will be continued on discharge.  Currently on room air.  Tolerating well.  Mild hypokalemia.  Replenished.   Hyponatremia Mild.  Sodium of 134.  Improved.  Recommend outpatient follow-up with BMP.   Paroxysmal atrial fibrillation (HCC) Status post cardioversion  08/08/2022. Cardiac monitor in March 2024 with sinus. His atrial fib occurred post-op. No known recurrence over last six months.  Eliquis  stopped by cardiology 01/2023 .on  cardizem  and lopressor  at home.  Currently on metoprolol .  Resume on discharge.  Rate controlled at this time.   S/P CABG x 3 S/p 3V CABG in 07/2022. Troponins and EKG was unremarkable.    Essential hypertension Hypertension on hold.  Initially was hypotensive.  Responded with IV fluids.  Patient is on Lotensin , Cardizem  and Lopressor  at home.  Has been able to ambulate without any symptoms.  Resume home medications on discharge.   GERD (gastroesophageal reflux disease) Continue Protonix    Benign prostatic hyperplasia with nocturia Has alfluzasin, but states he doesn't take this.    OSA (obstructive sleep apnea) Does not like to wear his CPAP.  Not on  supplemental oxygen   Disposition.  At this time, patient is stable for disposition home with outpatient PCP follow-up  Medical Consultants:   None.  Procedures:    None Subjective:   Today, patient seen and examined at bedside.  Denies any nausea, vomiting, fever, chills or rigor.  Ambulating in the room.  Wants to go home.  Feels  improved.  Discharge Exam:   Vitals:   10/13/23 0834 10/13/23 0904  BP:  (!) 125/57  Pulse:  87  Resp:  16  Temp:  98 F (36.7 C)  SpO2: 98% 94%   Vitals:   10/13/23 0420 10/13/23 0832 10/13/23 0834 10/13/23 0904  BP: 137/72   (!) 125/57  Pulse: 87   87  Resp:    16  Temp: 98.5 F (36.9 C)   98 F (36.7 C)  TempSrc: Oral   Oral  SpO2: 96% 98% 98% 94%  Weight:      Height:       Body mass index is 36.33 kg/m.   General: Alert awake, not in obvious distress, obese built, elderly male, Communicative.  Elderly male HENT: pupils equally reacting to light,  No scleral pallor or icterus noted. Oral mucosa is moist.  Chest:  Clear breath sounds.   Diminished breath sounds bilaterally. CVS: S1 &S2 heard. No murmur. Abdomen: Soft, nontender, nondistended.  Bowel sounds are heard.   Extremities: No cyanosis, clubbing or edema.  Peripheral pulses are palpable. Psych: Alert, awake and oriented, normal mood CNS:  No cranial nerve deficits.  Power equal in all extremities.   Skin: Warm and dry.  No rashes noted.  The results of significant diagnostics from this hospitalization (including imaging, microbiology, ancillary and laboratory) are listed below for reference.     Diagnostic Studies:   CT Head Wo Contrast Result Date: 10/12/2023 CLINICAL DATA:  Head trauma, coagulopathy (Age 59-64y) EXAM: CT HEAD WITHOUT CONTRAST TECHNIQUE: Contiguous axial images were obtained from the base of the skull through the vertex without intravenous contrast. RADIATION DOSE REDUCTION: This exam was performed according to the departmental dose-optimization program which includes automated exposure control, adjustment of the mA and/or kV according to patient size and/or use of iterative reconstruction technique. COMPARISON:  None Available. FINDINGS: Brain: No hemorrhage. No hydrocephalus. No extra-axial fluid collection. No mass effect. No mass lesion. No CT evidence of an acute cortical infarct. There is  a background of mild chronic microvascular ischemic change. Vascular: No hyperdense vessel or unexpected calcification. Skull: Normal. Negative for fracture or focal lesion. Sinuses/Orbits: No middle ear or mastoid effusion. Pansinus mucosal thickening. Orbits are unremarkable. Other: None. IMPRESSION: No CT evidence of intracranial injury. Electronically Signed   By: Lyndall Gore M.D.   On: 10/12/2023 12:56   DG Chest Port 1 View Result Date: 10/12/2023 CLINICAL DATA:  Questionable sepsis EXAM: PORTABLE CHEST 1 VIEW COMPARISON:  09/05/2022 FINDINGS: Cardiomegaly. Prior CABG. Low lung volumes. There is no edema, consolidation, effusion, or pneumothorax. Artifact from EKG leads. IMPRESSION: Low volume chest without acute or focal finding. Electronically Signed   By: Dorn Roulette M.D.   On: 10/12/2023 10:20     Labs:   Basic Metabolic Panel: Recent Labs  Lab 10/12/23 0931 10/13/23 0505  NA 132* 134*  K 4.3 3.4*  CL 98 102  CO2 24 25  GLUCOSE 150* 108*  BUN 13 11  CREATININE 1.91* 0.55*  CALCIUM  8.8* 8.2*   GFR Estimated Creatinine Clearance: 119.8 mL/min (A) (by C-G formula based on SCr of 0.55  mg/dL (L)). Liver Function Tests: Recent Labs  Lab 10/12/23 0931 10/13/23 0505  AST 26 18  ALT 20 16  ALKPHOS 66 53  BILITOT 1.2 0.9  PROT 7.4 6.4*  ALBUMIN  3.9 3.2*   No results for input(s): LIPASE, AMYLASE in the last 168 hours. No results for input(s): AMMONIA in the last 168 hours. Coagulation profile Recent Labs  Lab 10/12/23 0931  INR 1.0    CBC: Recent Labs  Lab 10/12/23 0931 10/13/23 0505  WBC 6.5 3.2*  NEUTROABS 3.7  --   HGB 14.5 12.4*  HCT 42.6 37.3*  MCV 98.4 100.0  PLT 156 126*   Cardiac Enzymes: No results for input(s): CKTOTAL, CKMB, CKMBINDEX, TROPONINI in the last 168 hours. BNP: Invalid input(s): POCBNP CBG: Recent Labs  Lab 10/12/23 0955  GLUCAP 147*   D-Dimer No results for input(s): DDIMER in the last 72 hours. Hgb  A1c No results for input(s): HGBA1C in the last 72 hours. Lipid Profile No results for input(s): CHOL, HDL, LDLCALC, TRIG, CHOLHDL, LDLDIRECT in the last 72 hours. Thyroid  function studies No results for input(s): TSH, T4TOTAL, T3FREE, THYROIDAB in the last 72 hours.  Invalid input(s): FREET3 Anemia work up No results for input(s): VITAMINB12, FOLATE, FERRITIN, TIBC, IRON, RETICCTPCT in the last 72 hours. Microbiology Recent Results (from the past 240 hours)  Blood Culture (routine x 2)     Status: None (Preliminary result)   Collection Time: 10/12/23  9:31 AM   Specimen: Left Antecubital; Blood  Result Value Ref Range Status   Specimen Description   Final    LEFT ANTECUBITAL Performed at [facility], [address] [place], [place] [address]    Special Requests   Final    BOTTLES DRAWN AEROBIC AND ANAEROBIC Blood Culture results may not be optimal due to an inadequate volume of blood received in culture bottles Performed at [facility], [address] [place], [place] [address]    Culture   Final    NO GROWTH < 24 HOURS Performed at [facility], [address], [place], [place] [address]    Report Status PENDING  Incomplete  Resp panel by RT-PCR (RSV, Flu A&B, Covid) Anterior Nasal Swab     Status: Abnormal   Collection Time: 10/12/23 10:25 AM   Specimen: Anterior Nasal Swab  Result Value Ref Range Status   SARS Coronavirus 2 by RT PCR POSITIVE (A) NEGATIVE Final    Comment: (NOTE) SARS-CoV-2 target nucleic acids are DETECTED.  The SARS-CoV-2 RNA is generally detectable in upper respiratory specimens during the acute phase of infection. Positive results are indicative of the presence of the identified virus, but do not rule out bacterial infection or co-infection with other pathogens not detected by the test. Clinical correlation with patient history and other diagnostic information is necessary to  determine patient infection status. The expected result is Negative.  Fact Sheet for Patients: bloggercourse.com  Fact Sheet for Healthcare Providers: seriousbroker.it  This test is not yet approved or cleared by the [place]  FDA and  has been authorized for detection and/or diagnosis of SARS-CoV-2 by FDA under an Emergency Use Authorization (EUA).  This EUA will remain in effect (meaning this test can be used) for the duration of  the COVID-19 declaration under Section 564(b)(1) of the A ct, 21 U.S.C. section 360bbb-3(b)(1), unless the authorization is terminated or revoked sooner.     Influenza A by PCR NEGATIVE NEGATIVE Final   Influenza  B by PCR NEGATIVE NEGATIVE Final    Comment: (NOTE) The Xpert Xpress SARS-CoV-2/FLU/RSV plus assay is intended as an aid in the diagnosis of influenza from Nasopharyngeal swab specimens and should not be used as a sole basis for treatment. Nasal washings and aspirates are unacceptable for Xpert Xpress SARS-CoV-2/FLU/RSV testing.  Fact Sheet for Patients: bloggercourse.com  Fact Sheet for Healthcare Providers: seriousbroker.it  This test is not yet approved or cleared by the [place]  FDA and has been authorized for detection and/or diagnosis of SARS-CoV-2 by FDA under an Emergency Use Authorization (EUA). This EUA will remain in effect (meaning this test can be used) for the duration of the COVID-19 declaration under Section 564(b)(1) of the Act, 21 U.S.C. section 360bbb-3(b)(1), unless the authorization is terminated or revoked.     Resp Syncytial Virus by PCR NEGATIVE NEGATIVE Final    Comment: (NOTE) Fact Sheet for Patients: bloggercourse.com  Fact Sheet for Healthcare Providers: seriousbroker.it  This test is not yet approved or cleared by the [place]  FDA and has  been authorized for detection and/or diagnosis of SARS-CoV-2 by FDA under an Emergency Use Authorization (EUA). This EUA will remain in effect (meaning this test can be used) for the duration of the COVID-19 declaration under Section 564(b)(1) of the Act, 21 U.S.C. section 360bbb-3(b)(1), unless the authorization is terminated or revoked.  Performed at [facility], [address] [place], [place] [address]   Blood Culture (routine x 2)     Status: None (Preliminary result)   Collection Time: 10/12/23 10:25 AM   Specimen: Arm; Blood  Result Value Ref Range Status   Specimen Description   Final    ARM LEFT Performed at [facility], [address] [place], [place] [address]    Special Requests   Final    BOTTLES DRAWN AEROBIC ONLY Blood Culture results may not be optimal due to an inadequate volume of blood received in culture bottles Performed at [facility], [address] [place], [place] [address]    Culture   Final    NO GROWTH < 24 HOURS Performed at [facility], [address] [place], [place] [address]    Report Status PENDING  Incomplete     Discharge Instructions:   Discharge Instructions     Call MD for:  difficulty breathing, headache or visual disturbances   Complete by: As directed    Call MD for:  severe uncontrolled pain   Complete by: As directed    Call MD for:  temperature >100.4   Complete by: As directed    Diet - low sodium heart healthy   Complete by: As directed    Discharge instructions   Complete by: As directed    Complete the course of paxlovid , seek medical attention for worsening symptoms. Follow up with your  primary care provider in one week. No overexertion. Ok to take tylenol  for pain, headache and fever.   Increase activity slowly   Complete by: As directed       Allergies as of 10/13/2023       Reactions   Hctz [hydrochlorothiazide] Shortness Of Breath   Enalapril Maleate    cramps    Tribenzor [olmesartan -amlodipine -hctz] Swelling   Fatigue, CP, edema   Verapamil     Fatigue        Medication List     PAUSE taking these medications    alfuzosin  10 MG 24 hr tablet Wait  to take this until: October 17, 2023 Morning Commonly known as: UROXATRAL  Take 1 tablet (10 mg total) by mouth daily with breakfast.   atorvastatin  80 MG tablet Wait to take this until: October 17, 2023 Morning Commonly known as: LIPITOR  Take 1 tablet (80 mg total) by mouth daily with breakfast.       TAKE these medications    ascorbic acid  500 MG tablet Commonly known as: VITAMIN C  Take 1 tablet (500 mg total) by mouth 2 (two) times daily for 15 days.   aspirin  EC 81 MG tablet Take 1 tablet (81 mg total) by mouth daily with breakfast. Swallow whole.   benazepril  20 MG tablet Commonly known as: LOTENSIN  Take 1 tablet (20 mg total) by mouth daily with supper.   diltiazem  180 MG 24 hr capsule Commonly known as: CARDIZEM  CD Take 1 capsule (180 mg total) by mouth every morning.   doxycycline  100 MG tablet Commonly known as: VIBRA -TABS Take 1 tablet (100 mg total) by mouth 2 (two) times daily for 5 days.   guaiFENesin -dextromethorphan 100-10 MG/5ML syrup Commonly known as: ROBITUSSIN DM Take 5 mLs by mouth every 4 (four) hours as needed for cough.   metoprolol  tartrate 25 MG tablet Commonly known as: LOPRESSOR  Take 1 tablet (25 mg total) by mouth 2 (two) times daily with morning and evening meals   nirmatrelvir /ritonavir  (renal dosing) 10 x 150 MG & 10 x 100MG  Tabs Commonly known as: PAXLOVID  Take 2 tablets by mouth 2 (two) times daily for 4 days. Take nirmatrelvir  (150 mg) one tablet twice daily for 4 days and ritonavir  (100 mg) one tablet twice daily for 4 days.   pantoprazole  40 MG tablet Commonly known as: PROTONIX  Take 1 tablet (40 mg total) by mouth every evening. Please make appointment for refills          Time coordinating discharge: 39  minutes  Signed:  Makale Pindell  [facility] 10/13/2023, 1:40 PM

## 2023-10-13 NOTE — Care Management CC44 (Signed)
 Condition Code 44 Documentation Completed  Patient Details  Name: Hector Edwards MRN: [id number] Date of Birth: 07/06/1946   Condition Code 44 given:  Yes Patient signature on Condition Code 44 notice:  Yes Documentation of 2 MD's agreement:  Yes Code 44 added to claim:  Yes    Mitzie LOISE Pinal, LCSW 10/13/2023, 1:46 PM

## 2023-10-13 NOTE — Plan of Care (Signed)
  Problem: Coping: Goal: Psychosocial and spiritual needs will be supported Outcome: Progressing   Problem: Respiratory: Goal: Will maintain a patent airway Outcome: Progressing Goal: Complications related to the disease process, condition or treatment will be avoided or minimized Outcome: Progressing   Problem: Education: Goal: Knowledge of General Education information will improve Description: Including pain rating scale, medication(s)/side effects and non-pharmacologic comfort measures Outcome: Progressing   Problem: Clinical Measurements: Goal: Respiratory complications will improve Outcome: Progressing

## 2023-10-15 ENCOUNTER — Other Ambulatory Visit: Payer: Self-pay

## 2023-10-16 ENCOUNTER — Other Ambulatory Visit: Payer: Self-pay

## 2023-10-17 LAB — CULTURE, BLOOD (ROUTINE X 2)
Culture: NO GROWTH
Culture: NO GROWTH

## 2023-11-04 ENCOUNTER — Other Ambulatory Visit: Payer: Self-pay

## 2023-11-07 ENCOUNTER — Other Ambulatory Visit: Payer: Self-pay

## 2023-11-21 ENCOUNTER — Other Ambulatory Visit: Payer: Self-pay | Admitting: Family

## 2023-11-21 ENCOUNTER — Other Ambulatory Visit: Payer: Self-pay

## 2023-11-27 ENCOUNTER — Other Ambulatory Visit: Payer: Self-pay

## 2023-12-03 ENCOUNTER — Other Ambulatory Visit: Payer: Self-pay

## 2023-12-03 ENCOUNTER — Other Ambulatory Visit: Payer: Self-pay | Admitting: Family

## 2023-12-03 MED ORDER — ALFUZOSIN HCL ER 10 MG PO TB24
10.0000 mg | ORAL_TABLET | Freq: Every day | ORAL | 1 refills | Status: DC
  Filled 2023-12-03: qty 30, 30d supply, fill #0
  Filled 2023-12-12 – 2023-12-26 (×2): qty 30, 30d supply, fill #1

## 2023-12-04 ENCOUNTER — Other Ambulatory Visit: Payer: Self-pay

## 2023-12-09 ENCOUNTER — Other Ambulatory Visit: Payer: Self-pay

## 2023-12-12 ENCOUNTER — Other Ambulatory Visit: Payer: Self-pay

## 2023-12-26 ENCOUNTER — Other Ambulatory Visit: Payer: Self-pay

## 2023-12-26 ENCOUNTER — Other Ambulatory Visit (HOSPITAL_COMMUNITY): Payer: Self-pay

## 2024-01-10 DIAGNOSIS — Z1211 Encounter for screening for malignant neoplasm of colon: Secondary | ICD-10-CM | POA: Diagnosis not present

## 2024-01-21 ENCOUNTER — Other Ambulatory Visit: Payer: Self-pay | Admitting: Family

## 2024-01-21 ENCOUNTER — Other Ambulatory Visit (HOSPITAL_COMMUNITY): Payer: Self-pay

## 2024-01-21 ENCOUNTER — Other Ambulatory Visit: Payer: Self-pay | Admitting: Cardiovascular Disease

## 2024-01-21 ENCOUNTER — Other Ambulatory Visit: Payer: Self-pay

## 2024-01-21 MED ORDER — ALFUZOSIN HCL ER 10 MG PO TB24
10.0000 mg | ORAL_TABLET | Freq: Every day | ORAL | 0 refills | Status: DC
  Filled 2024-01-21: qty 30, 30d supply, fill #0

## 2024-01-22 ENCOUNTER — Other Ambulatory Visit: Payer: Self-pay

## 2024-01-22 ENCOUNTER — Other Ambulatory Visit (HOSPITAL_COMMUNITY): Payer: Self-pay

## 2024-01-22 MED ORDER — METOPROLOL TARTRATE 25 MG PO TABS
25.0000 mg | ORAL_TABLET | Freq: Two times a day (BID) | ORAL | 0 refills | Status: DC
  Filled 2024-01-22: qty 60, 30d supply, fill #0

## 2024-01-22 MED ORDER — DILTIAZEM HCL ER COATED BEADS 180 MG PO CP24
180.0000 mg | ORAL_CAPSULE | ORAL | 0 refills | Status: DC
  Filled 2024-01-22: qty 30, 30d supply, fill #0

## 2024-01-22 MED ORDER — ATORVASTATIN CALCIUM 80 MG PO TABS
80.0000 mg | ORAL_TABLET | Freq: Every day | ORAL | 0 refills | Status: DC
  Filled 2024-01-22: qty 30, 30d supply, fill #0

## 2024-01-22 MED ORDER — BENAZEPRIL HCL 20 MG PO TABS
20.0000 mg | ORAL_TABLET | Freq: Every day | ORAL | 0 refills | Status: DC
  Filled 2024-01-22: qty 30, 30d supply, fill #0

## 2024-01-23 ENCOUNTER — Other Ambulatory Visit: Payer: Self-pay

## 2024-01-24 ENCOUNTER — Other Ambulatory Visit: Payer: Self-pay

## 2024-01-27 ENCOUNTER — Telehealth: Payer: Self-pay | Admitting: Cardiovascular Disease

## 2024-01-27 NOTE — Telephone Encounter (Signed)
 Pt c/o swelling: STAT is pt has developed SOB within 24 hours  How much weight have you gained and in what time span?  Sister says patient doesn't weight himself often  If swelling, where is the swelling located?  Mainly in legs   Are you currently taking a fluid pill?    Are you currently SOB? Sister says patient gets SOB easily when up and moving around  Do you have a log of your daily weights (if so, list)?   Have you gained 3 pounds in a day or 5 pounds in a week?   Have you traveled recently?  No

## 2024-01-27 NOTE — Telephone Encounter (Signed)
 Left voicemail to return call to office

## 2024-01-31 ENCOUNTER — Encounter: Payer: Self-pay | Admitting: Family

## 2024-01-31 LAB — COLOGUARD: COLOGUARD: NEGATIVE

## 2024-02-03 ENCOUNTER — Other Ambulatory Visit: Payer: Self-pay | Admitting: Cardiovascular Disease

## 2024-02-03 ENCOUNTER — Other Ambulatory Visit: Payer: Self-pay | Admitting: Family

## 2024-02-03 ENCOUNTER — Other Ambulatory Visit: Payer: Self-pay

## 2024-02-03 MED ORDER — ATORVASTATIN CALCIUM 80 MG PO TABS
80.0000 mg | ORAL_TABLET | Freq: Every day | ORAL | 0 refills | Status: DC
  Filled 2024-02-18 – 2024-02-19 (×2): qty 30, 30d supply, fill #0

## 2024-02-03 MED ORDER — DILTIAZEM HCL ER COATED BEADS 180 MG PO CP24
180.0000 mg | ORAL_CAPSULE | ORAL | 0 refills | Status: DC
  Filled 2024-02-18 – 2024-02-19 (×2): qty 30, 30d supply, fill #0

## 2024-02-03 MED ORDER — BENAZEPRIL HCL 20 MG PO TABS
20.0000 mg | ORAL_TABLET | Freq: Every day | ORAL | 0 refills | Status: DC
  Filled 2024-02-18 – 2024-02-19 (×2): qty 30, 30d supply, fill #0

## 2024-02-03 MED ORDER — METOPROLOL TARTRATE 25 MG PO TABS
25.0000 mg | ORAL_TABLET | Freq: Two times a day (BID) | ORAL | 0 refills | Status: DC
  Filled 2024-02-18: qty 60, 30d supply, fill #0

## 2024-02-03 NOTE — Telephone Encounter (Signed)
 Please call sister Vernal Gold at [phone]

## 2024-02-03 NOTE — Telephone Encounter (Signed)
 Tried to return call but the number is invalid.

## 2024-02-04 ENCOUNTER — Other Ambulatory Visit: Payer: Self-pay

## 2024-02-04 NOTE — Telephone Encounter (Signed)
 Called and spoke w patient's sister.  She knows the patient is due for appointment and reports he has been having swelling.  I scheduled him with Dr. Abel Hoe tomorrow morning.  No other needs at this time.

## 2024-02-05 ENCOUNTER — Ambulatory Visit: Attending: Cardiovascular Disease | Admitting: Cardiovascular Disease

## 2024-02-05 ENCOUNTER — Encounter: Payer: Self-pay | Admitting: Cardiovascular Disease

## 2024-02-05 VITALS — BP 108/62 | HR 94 | Ht 76.0 in | Wt 305.4 lb

## 2024-02-05 DIAGNOSIS — I2510 Atherosclerotic heart disease of native coronary artery without angina pectoris: Secondary | ICD-10-CM | POA: Diagnosis not present

## 2024-02-05 DIAGNOSIS — I10 Essential (primary) hypertension: Secondary | ICD-10-CM

## 2024-02-05 DIAGNOSIS — I480 Paroxysmal atrial fibrillation: Secondary | ICD-10-CM

## 2024-02-05 DIAGNOSIS — I350 Nonrheumatic aortic (valve) stenosis: Secondary | ICD-10-CM

## 2024-02-05 DIAGNOSIS — E7800 Pure hypercholesterolemia, unspecified: Secondary | ICD-10-CM

## 2024-02-05 NOTE — Progress Notes (Signed)
 Chief Complaint  Patient presents with   Follow-up    CAD   History of Present Illness: 78 yo male with history of CAD s/p CABG, post-operative atrial fibrillation, obesity, HTN, HLD, mild aortic stenosis and sleep apnea who is here today for follow up. I saw him as a new consult for the evaluation of dyspnea in September 2023. Echo September 2020 with normal LV function and no valvular disease. At his visit here in September 2023 he described progressive dyspnea and fatigue but no chest pain. He has sleep apnea but does not tolerate CPAP. Echo September 2023 with LVEF=60-65%, moderate LVH. Mild aortic stenosis with mean gradient of 12 mmHg. Coronary CTA 07/24/22 with severe disease in the LAD and RCA. Cardiac catheterization October 2023 with severe left main and three vessel CAD. He underwent 3V CABG in Octoberr 2023. (LIMA to LAD, SVG to PDA, SVG to posterolateral branch).  Post-op course complicated by A Fib RVR  that persisted despite cardizem  and digoxin . He underwent cardioversion on 08/08/2022. Echo prior to discharge in November 2023 with LVEF=60-65%. He was seen in atrial fib clinic following discharge and was continued on Eliquis  and amiodarone . Cardiac monitor March 2024 with no evidence of atrial fib. Amiodarone  and Eliquis  were stopped.  He is here today for follow up. The patient denies any chest pain, dyspnea, palpitations, lower extremity edema, orthopnea, PND, dizziness, near syncope or syncope. He has fatigue.   Primary Care Physician: Adra Alanis, FNP   Past Medical History:  Diagnosis Date   Depression    GERD (gastroesophageal reflux disease)    Hx of adenomatous colonic polyps 09/04/2015   Hyperlipidemia    Hypertension    LBP (low back pain)    OSA (obstructive sleep apnea) 08/01/2018   Sleep apnea    wears cpap     Past Surgical History:  Procedure Laterality Date   CARDIOVERSION N/A 08/08/2022   Procedure: CARDIOVERSION;  Surgeon: Maudine Sos,  MD;  Location[facility] ENDOSCOPY;  Service: Cardiovascular;  Laterality: N/A;   COLONOSCOPY  2004, 2021   hyperplastic polyps x 2   CORONARY ARTERY BYPASS GRAFT N/A 08/03/2022   Procedure: CORONARY ARTERY BYPASS GRAFTING (CABG) 3X, USING RIGHT GREATER SAPHENOUS VEIN AND LEFT INTERNAL MAMMARY ARTERY.;  Surgeon: Bartley Lightning, MD;  Location: [facility] OR;  Service: Open Heart Surgery;  Laterality: N/A;   KNEE ARTHROSCOPY Right    LEFT HEART CATH AND CORONARY ANGIOGRAPHY N/A 08/01/2022   Procedure: LEFT HEART CATH AND CORONARY ANGIOGRAPHY;  Surgeon: Odie Benne, MD;  Location: [facility] INVASIVE CV LAB;  Service: Cardiovascular;  Laterality: N/A;   PENILE PROSTHESIS IMPLANT N/A 01/25/2020   Procedure: PENILE PROTHESIS INFLATABLE COLOPLAST;  Surgeon: Samson Croak, MD;  Location: [facility];  Service: Urology;  Laterality: N/A;   SHOULDER ARTHROSCOPY W/ ROTATOR CUFF REPAIR Left    TEE WITHOUT CARDIOVERSION N/A 08/03/2022   Procedure: TRANSESOPHAGEAL ECHOCARDIOGRAM (TEE);  Surgeon: Bartley Lightning, MD;  Location: [facility];  Service: Open Heart Surgery;  Laterality: N/A;   TEE WITHOUT CARDIOVERSION N/A 08/08/2022   Procedure: TRANSESOPHAGEAL ECHOCARDIOGRAM (TEE);  Surgeon: Maudine Sos, MD;  Location: [facility] ENDOSCOPY;  Service: Cardiovascular;  Laterality: N/A;   TONSILLECTOMY  as child   vocal cord growth removed  age 59   benign   WISDOM TOOTH EXTRACTION      Current Outpatient Medications  Medication Sig Dispense Refill   alfuzosin  (UROXATRAL ) 10 MG 24 hr tablet Take 1 tablet (10  mg total) by mouth daily with breakfast. 30 tablet 0   aspirin  EC 81 MG tablet Take 1 tablet (81 mg total) by mouth daily with breakfast. Swallow whole. 30 tablet 11   atorvastatin  (LIPITOR ) 80 MG tablet Take 1 tablet (80 mg total) by mouth daily with breakfast. Please call [phone] to schedule an overdue appointment for future refills. Thank you.  FINAL ATTEMPT. 30 tablet 0   benazepril  (LOTENSIN ) 20  MG tablet Take 1 tablet (20 mg total) by mouth daily with supper. Please call [phone] to schedule an appointment for future refills. Thank you. 2nd attempt. 30 tablet 0   diltiazem  (CARDIZEM  CD) 180 MG 24 hr capsule Take 1 capsule (180 mg total) by mouth every morning. Please call [phone] to schedule an overdue appointment for future refills. Thank you. 1st attempt. 30 capsule 0   pantoprazole  (PROTONIX ) 40 MG tablet Take 1 tablet (40 mg total) by mouth every evening. Please make appointment for refills 90 tablet 3   guaiFENesin -dextromethorphan (ROBITUSSIN DM) 100-10 MG/5ML syrup Take 5 mLs by mouth every 4 (four) hours as needed for cough. 118 mL 0   No current facility-administered medications for this visit.    Allergies  Allergen Reactions   Hctz [Hydrochlorothiazide] Shortness Of Breath   Enalapril Maleate     cramps   Tribenzor [Olmesartan -Amlodipine -Hctz] Swelling    Fatigue, CP, edema   Verapamil      Fatigue    Social History   Socioeconomic History   Marital status: Divorced    Spouse name: Not on file   Number of children: 1   Years of education: Not on file   Highest education level: Not on file  Occupational History   Occupation: Retired   Occupation: Retired-mailman  Tobacco Use   Smoking status: Never   Smokeless tobacco: Never   Tobacco comments:    Never smoke 08/22/22  Vaping Use   Vaping status: Never Used  Substance and Sexual Activity   Alcohol use: Not Currently    Alcohol/week: 4.0 standard drinks of alcohol    Types: 4 Cans of beer per week    Comment: stop drinking 08/22/22   Drug use: No   Sexual activity: Not Currently  Other Topics Concern   Not on file  Social History Narrative   Regular exercise- yes, seldom; working physically all the time.   Social Drivers of Corporate investment banker Strain: Low Risk  (03/06/2022)   Overall Financial Resource Strain (CARDIA)    Difficulty of Paying Living Expenses: Not hard at all  Food  Insecurity: No Food Insecurity (10/12/2023)   Hunger Vital Sign    Worried About Running Out of Food in the Last Year: Never true    Ran Out of Food in the Last Year: Never true  Transportation Needs: No Transportation Needs (10/12/2023)   PRAPARE - Administrator, Civil Service (Medical): No    Lack of Transportation (Non-Medical): No  Physical Activity: Inactive (03/06/2022)   Exercise Vital Sign    Days of Exercise per Week: 0 days    Minutes of Exercise per Session: 0 min  Stress: No Stress Concern Present (03/06/2022)   [employer] of Occupational Health - Occupational Stress Questionnaire    Feeling of Stress : Not at all  Social Connections: Moderately Integrated (10/13/2023)   Social Connection and Isolation Panel [NHANES]    Frequency of Communication with Friends and Family: More than three times a week    Frequency of Social  Gatherings with Friends and Family: More than three times a week    Attends Religious Services: 1 to 4 times per year    Active Member of [employer] or Organizations: Yes    Attends Engineer, structural: More than 4 times per year    Marital Status: Divorced  Intimate Partner Violence: Not At Risk (10/12/2023)   Humiliation, Afraid, Rape, and Kick questionnaire    Fear of Current or Ex-Partner: No    Emotionally Abused: No    Physically Abused: No    Sexually Abused: No    Family History  Problem Relation Age of Onset   Stroke Mother 68   Depression Mother    Hypertension Father    Heart disease Father    Colon polyps Father    Pancreatic cancer Brother    Colon polyps Brother    Throat cancer Brother    Breast cancer Daughter    Hypertension Other    Colon cancer Neg Hx    Esophageal cancer Neg Hx    Stomach cancer Neg Hx    Rectal cancer Neg Hx     Review of Systems:  As stated in the HPI and otherwise negative.   BP 108/62   Pulse 94   Ht 6\' 4"  (1.93 m)   Wt (!) 138.5 kg   SpO2 97%   BMI 37.17 kg/m   Physical  Examination: General: Well developed, well nourished, NAD  HEENT: OP clear, mucus membranes moist  SKIN: warm, dry. No rashes. Neuro: No focal deficits  Musculoskeletal: Muscle strength 5/5 all ext  Psychiatric: Mood and affect normal  Neck: No JVD, no carotid bruits, no thyromegaly, no lymphadenopathy.  Lungs:Clear bilaterally, no wheezes, rhonci, crackles Cardiovascular: Regular rate and rhythm. Systolic murmur.  Abdomen:Soft. Bowel sounds present. Non-tender.  Extremities: No lower extremity edema. Pulses are 2 + in the bilateral DP/PT.  EKG:  EKG is not ordered today. The ekg ordered today demonstrates  Recent Labs: 10/13/2023: ALT 16; BUN 11; Creatinine, Ser 0.55; Hemoglobin 12.4; Platelets 126; Potassium 3.4; Sodium 134   Lipid Panel    Component Value Date/Time   CHOL 87 09/03/2023 1149   CHOL 170 06/16/2019 1035   TRIG 63.0 09/03/2023 1149   HDL 30.40 (L) 09/03/2023 1149   HDL 32 (L) 06/16/2019 1035   CHOLHDL 3 09/03/2023 1149   VLDL 12.6 09/03/2023 1149   LDLCALC 44 09/03/2023 1149   LDLCALC 121 (H) 06/16/2019 1035   LDLDIRECT 158.5 10/19/2008 0858     Wt Readings from Last 3 Encounters:  02/05/24 (!) 138.5 kg  10/12/23 135.4 kg  09/03/23 135.4 kg    Assessment and Plan:   1. CAD s/p CABG without angina: No chest pain. Continue ASA, statin. He has fatigue. Will stop metoprolol .    2. HTN: BP is well controlled. Stopping metoprolol .   3. Hyperlipidemia: LDL 44 in November2024. Continue statin  4. Atrial fibrillation, paroxysmal: Sinus today on exam. Cardiac monitor in March 2024 with sinus. His atrial fib occurred post-op. No known recurrence. His Eliquis  was stopped in 2024. Continue Cardizem .   5. Aortic stenosis: Mild by echo in 2023. Will repeat his echo now.   Labs/ tests ordered today include:   Orders Placed This Encounter  Procedures   ECHOCARDIOGRAM COMPLETE   Disposition:   F/U with me in 12 months  Signed, Antoinette Batman,  MD 02/05/2024 9:27 AM    [facility] [address] [place], [place], [place]  [address] Phone: [phone]; Fax: [phone]

## 2024-02-05 NOTE — Patient Instructions (Signed)
 Medication Instructions:  Your physician has recommended you make the following change in your medication:  1.) stop metoprolol   *If you need a refill on your cardiac medications before your next appointment, please call your pharmacy*  Lab Work: none  Testing/Procedures: Your physician has requested that you have an echocardiogram. Echocardiography is a painless test that uses sound waves to create images of your heart. It provides your doctor with information about the size and shape of your heart and how well your heart's chambers and valves are working. This procedure takes approximately one hour. There are no restrictions for this procedure. Please do NOT wear cologne, perfume, aftershave, or lotions (deodorant is allowed). Please arrive 15 minutes prior to your appointment time.  Please note: We ask at that you not bring children with you during ultrasound (echo/ vascular) testing. Due to room size and safety concerns, children are not allowed in the ultrasound rooms during exams. Our front office staff cannot provide observation of children in our lobby area while testing is being conducted. An adult accompanying a patient to their appointment will only be allowed in the ultrasound room at the discretion of the ultrasound technician under special circumstances. We apologize for any inconvenience.   Follow-Up: At [facility], you and your health needs are our priority.  As part of our continuing mission to provide you with exceptional heart care, our providers are all part of one team.  This team includes your primary Cardiologist (physician) and Advanced Practice Providers or APPs (Physician Assistants and Nurse Practitioners) who all work together to provide you with the care you need, when you need it.  Your next appointment:   12 month(s)  Provider:   Antoinette Batman, MD

## 2024-02-17 ENCOUNTER — Other Ambulatory Visit (HOSPITAL_COMMUNITY): Payer: Self-pay

## 2024-02-18 ENCOUNTER — Other Ambulatory Visit: Payer: Self-pay | Admitting: Cardiovascular Disease

## 2024-02-18 ENCOUNTER — Other Ambulatory Visit: Payer: Self-pay

## 2024-02-18 ENCOUNTER — Other Ambulatory Visit (HOSPITAL_COMMUNITY): Payer: Self-pay

## 2024-02-19 ENCOUNTER — Other Ambulatory Visit (HOSPITAL_COMMUNITY): Payer: Self-pay

## 2024-02-19 ENCOUNTER — Other Ambulatory Visit: Payer: Self-pay

## 2024-02-20 ENCOUNTER — Other Ambulatory Visit: Payer: Self-pay

## 2024-03-09 ENCOUNTER — Other Ambulatory Visit: Payer: Self-pay | Admitting: Cardiovascular Disease

## 2024-03-09 ENCOUNTER — Other Ambulatory Visit: Payer: Self-pay

## 2024-03-09 MED ORDER — DILTIAZEM HCL ER COATED BEADS 180 MG PO CP24
180.0000 mg | ORAL_CAPSULE | ORAL | 3 refills | Status: AC
  Filled 2024-04-09: qty 30, 30d supply, fill #0
  Filled 2024-05-12: qty 30, 30d supply, fill #1
  Filled 2024-06-09: qty 30, 30d supply, fill #2
  Filled 2024-07-09: qty 30, 30d supply, fill #3
  Filled 2024-08-17: qty 30, 30d supply, fill #4
  Filled 2024-09-11: qty 30, 30d supply, fill #5
  Filled 2024-10-14: qty 30, 30d supply, fill #6

## 2024-03-09 MED ORDER — BENAZEPRIL HCL 20 MG PO TABS
20.0000 mg | ORAL_TABLET | Freq: Every day | ORAL | 3 refills | Status: DC
  Filled 2024-04-09: qty 30, 30d supply, fill #0
  Filled 2024-05-12: qty 30, 30d supply, fill #1
  Filled 2024-06-09: qty 30, 30d supply, fill #2
  Filled 2024-07-09: qty 30, 30d supply, fill #3
  Filled 2024-08-17: qty 30, 30d supply, fill #4
  Filled 2024-09-11 – 2024-10-14 (×2): qty 30, 30d supply, fill #5

## 2024-03-09 MED ORDER — ATORVASTATIN CALCIUM 80 MG PO TABS
80.0000 mg | ORAL_TABLET | Freq: Every day | ORAL | 3 refills | Status: AC
  Filled 2024-04-09: qty 30, 30d supply, fill #0
  Filled 2024-05-12: qty 30, 30d supply, fill #1
  Filled 2024-06-09: qty 30, 30d supply, fill #2
  Filled 2024-07-09: qty 30, 30d supply, fill #3
  Filled 2024-08-17: qty 30, 30d supply, fill #4
  Filled 2024-09-11: qty 30, 30d supply, fill #5
  Filled 2024-10-14: qty 30, 30d supply, fill #6

## 2024-03-10 ENCOUNTER — Other Ambulatory Visit: Payer: Self-pay

## 2024-03-10 ENCOUNTER — Other Ambulatory Visit (HOSPITAL_COMMUNITY): Payer: Self-pay

## 2024-03-11 ENCOUNTER — Encounter (HOSPITAL_COMMUNITY): Payer: Self-pay | Admitting: Cardiovascular Disease

## 2024-03-11 ENCOUNTER — Ambulatory Visit (HOSPITAL_COMMUNITY): Attending: Cardiology

## 2024-04-09 ENCOUNTER — Other Ambulatory Visit: Payer: Self-pay

## 2024-04-16 ENCOUNTER — Other Ambulatory Visit: Payer: Self-pay

## 2024-04-21 ENCOUNTER — Telehealth: Payer: Self-pay | Admitting: Cardiovascular Disease

## 2024-04-21 NOTE — Telephone Encounter (Signed)
 Spoke with patient's [name] (OK per [facility]). She reports she does not live with patient, but when she saw him recently she noticed his feet and ankles were swollen, and it appeared his stomach has gotten larger. She also reports he is hoarse, but does not seem to have a cough. He has SOB with minimal activity. [name] reports this has been going on for the past couple of weeks and she is worried about patient.  [name] also report she does not think patient has been taking his medications like he should. Patient No Showed for echo scheduled last month. Offered next available appt with [facility] on 7/18, [name] accepted. Patient is scheduled to see Dr. Ladona ([facility]) on 7/18 for evaluation.   Advised on ED precautions. Also advised [name] contact patient's PCP. [name] verbalized understanding.

## 2024-04-21 NOTE — Telephone Encounter (Signed)
 Pt c/o swelling/edema: STAT if pt has developed SOB within 24 hours  If swelling, where is the swelling located?  In his feet/ ankles / she stated it feels like his stomach as gotten larger   How much weight have you gained and in what time span? No   Have you gained 2 pounds in a day or 5 pounds in a week? Na  Do you have a log of your daily weights (if so, list)? Na   Are you currently taking a fluid pill?   Are you currently SOB? Yes when he is up moving around   Have you traveled recently in a car or plane for an extended period of time? Na   [phone].  She was not currently with patient.

## 2024-04-24 ENCOUNTER — Other Ambulatory Visit: Payer: Self-pay | Admitting: *Deleted

## 2024-04-24 ENCOUNTER — Other Ambulatory Visit (HOSPITAL_COMMUNITY): Payer: Self-pay

## 2024-04-24 ENCOUNTER — Encounter: Payer: Self-pay | Admitting: Cardiovascular Disease

## 2024-04-24 ENCOUNTER — Other Ambulatory Visit: Payer: Self-pay

## 2024-04-24 ENCOUNTER — Encounter: Payer: Self-pay | Admitting: Cardiology

## 2024-04-24 ENCOUNTER — Ambulatory Visit: Attending: Cardiology | Admitting: Cardiology

## 2024-04-24 VITALS — BP 138/92 | HR 91 | Ht 76.0 in | Wt 314.0 lb

## 2024-04-24 DIAGNOSIS — I10 Essential (primary) hypertension: Secondary | ICD-10-CM

## 2024-04-24 DIAGNOSIS — G4733 Obstructive sleep apnea (adult) (pediatric): Secondary | ICD-10-CM | POA: Diagnosis not present

## 2024-04-24 DIAGNOSIS — I5033 Acute on chronic diastolic (congestive) heart failure: Secondary | ICD-10-CM

## 2024-04-24 DIAGNOSIS — E66812 Obesity, class 2: Secondary | ICD-10-CM | POA: Diagnosis not present

## 2024-04-24 DIAGNOSIS — Z6838 Body mass index (BMI) 38.0-38.9, adult: Secondary | ICD-10-CM | POA: Diagnosis not present

## 2024-04-24 DIAGNOSIS — R600 Localized edema: Secondary | ICD-10-CM

## 2024-04-24 MED ORDER — SPIRONOLACTONE 25 MG PO TABS
25.0000 mg | ORAL_TABLET | ORAL | 2 refills | Status: DC
  Filled 2024-04-24 (×3): qty 30, 30d supply, fill #0
  Filled 2024-05-12 – 2024-05-18 (×2): qty 30, 30d supply, fill #1
  Filled 2024-06-09: qty 30, 30d supply, fill #2

## 2024-04-24 NOTE — Patient Instructions (Signed)
 Medication Instructions:  Start spironolactone 25 mg by mouth daily  *If you need a refill on your cardiac medications before your next appointment, please call your pharmacy*  Lab Work: Have lab work done in 2 weeks.  BMP and BNP.  This is not fasting.  Can be done at any [facility] location.  There is a [facility] on the first floor of our building If you have labs (blood work) drawn today and your tests are completely normal, you will receive your results only by: MyChart Message (if you have MyChart) OR A paper copy in the mail If you have any lab test that is abnormal or we need to change your treatment, we will call you to review the results.  Testing/Procedures: Please reschedule  echocardiogram appointment  Your physician has requested that you have an echocardiogram. Echocardiography is a painless test that uses sound waves to create images of your heart. It provides your doctor with information about the size and shape of your heart and how well your heart's chambers and valves are working. This procedure takes approximately one hour. There are no restrictions for this procedure. Please do NOT wear cologne, perfume, aftershave, or lotions (deodorant is allowed). Please arrive 15 minutes prior to your appointment time.  Please note: We ask at that you not bring children with you during ultrasound (echo/ vascular) testing. Due to room size and safety concerns, children are not allowed in the ultrasound rooms during exams. Our front office staff cannot provide observation of children in our lobby area while testing is being conducted. An adult accompanying a patient to their appointment will only be allowed in the ultrasound room at the discretion of the ultrasound technician under special circumstances. We apologize for any inconvenience.   Follow-Up: At [facility], you and your health needs are our priority.  As part of our continuing mission to provide you with exceptional heart  care, our providers are all part of one team.  This team includes your primary Cardiologist (physician) and Advanced Practice Providers or APPs (Physician Assistants and Nurse Practitioners) who all work together to provide you with the care you need, when you need it.  Your next appointment:   3 - 4 week(s)  Provider:  Dr Verlin or  One of our Advanced Practice Providers (APPs): Morse Clause, PA-C  Lamarr Satterfield, NP Miriam Shams, NP  Olivia Pavy, PA-C Josefa Beauvais, NP  Leontine Salen, PA-C Orren Fabry, PA-C  [place], PA-C Ernest Dick, NP  Damien Braver, NP Jon Hails, PA-C  Waddell Donath, PA-C    Dayna Dunn, PA-C  Scott Weaver, PA-C Lum Louis, NP Katlyn West, NP Callie Goodrich, PA-C  Evan Williams, PA-C Sheng Haley, PA-C  Xika Zhao, NP Kathleen Johnson, PA-C       We recommend signing up for the patient portal called MyChart.  Sign up information is provided on this After Visit Summary.  MyChart is used to connect with patients for Virtual Visits (Telemedicine).  Patients are able to view lab/test results, encounter notes, upcoming appointments, etc.  Non-urgent messages can be sent to your provider as well.   To learn more about what you can do with MyChart, go to [email or web address].   Other Instructions

## 2024-04-24 NOTE — Progress Notes (Signed)
 Cardiology Office Note:  .   Date:  04/24/2024  ID:  Hector Edwards, DOB 1945/12/17, MRN [id number] PCP: Hector Leita Repine, FNP  [facility] Providers Cardiologist:  Hector Cash, MD   History of Present Illness: .   Hector Edwards is a 78 y.o.male with history of CAD s/p CABG on 08/03/2022 with LIMA to LAD, SVG to PDA and SVG to PL branch of RCA, post-operative atrial fibrillation, obesity, HTN, HLD, mild aortic stenosis and sleep apnea not on therapy, is being seen as a sick working for my partner Dr. Cash for evaluation of worsening leg edema and abdominal distention.  A telephone call was made by his sister requesting appointment and also endorsed that patient has not been taking medications as prescribed. Last hospitalization was in January 2025 with COVID infection and acute kidney injury. He has last seen his PCP on 08/28/2022 but no further follow-up in the past year and a half. Accompanied by his sister at the bedside, no specific complaints except for gradually increasing leg edema and weight gain.  States that he is doing his routine activities without any limitation but does admit to sedentary lifestyle.  Discussed the use of AI scribe software for clinical note transcription with the patient, who gave verbal consent to proceed.  History of Present Illness Hector Edwards is a 78 year old male with coronary artery disease and atrial fibrillation who presents with leg swelling and weight gain. He is accompanied by his sister, Hector Edwards, who is his primary caregiver. He was referred by Dr. Patsie for evaluation of possible heart failure.  He experiences swelling in his feet and legs, which became noticeable when he could no longer fit into his shoes. He also feels bloated and heavy in his abdomen. He has gained approximately 10 to 15 pounds since his last hospital visit, likely due to fluid retention rather than increased food intake.  His daily  activities include mowing the lawn with a riding mower, feeding horses, and working on cars, but he has limitations in walking long distances. During a recent visit to a hardware store, he felt well, with no unusual shortness of breath aside from leg swelling.  He occasionally forgets to take his medications but generally takes them regularly. He underwent a four-vessel coronary artery bypass graft on August 03, 2022, and has atrial fibrillation, for which he was briefly on blood thinners. He was hospitalized with COVID-19 in January 2025.   Labs   Lab Results  Component Value Date   CHOL 87 09/03/2023   HDL 30.40 (L) 09/03/2023   LDLCALC 44 09/03/2023   LDLDIRECT 158.5 10/19/2008   TRIG 63.0 09/03/2023   CHOLHDL 3 09/03/2023   Lipoprotein (a)  Date/Time Value Ref Range Status  08/02/2022 02:55 AM 25.3 <75.0 nmol/L Final    Comment:    (NOTE) Note:  Values greater than or equal to 75.0 nmol/L may       indicate an independent risk factor for CHD,       but must be evaluated with caution when applied       to non-Caucasian populations due to the       influence of genetic factors on Lp(a) across       ethnicities. Performed At: [facility] [address] [place], [place] [id number] Jennette Shorter MD Ey:[id number]     Lab Results  Component Value Date   NA 134 (L) 10/13/2023   K 3.4 (L) 10/13/2023   CO2  25 10/13/2023   GLUCOSE 108 (H) 10/13/2023   BUN 11 10/13/2023   CREATININE 0.55 (L) 10/13/2023   CALCIUM  8.2 (L) 10/13/2023   GFR 76.59 09/03/2023   EGFR 86 07/27/2022   GFRNONAA >60 10/13/2023      Latest Ref Rng & Units 10/13/2023    5:05 AM 10/12/2023    9:31 AM 09/03/2023   11:49 AM  BMP  Glucose 70 - 99 mg/dL 891  849  897   BUN 8 - 23 mg/dL 11  13  13    Creatinine 0.61 - 1.24 mg/dL 9.44  8.08  9.02   Sodium 135 - 145 mmol/L 134  132  141   Potassium 3.5 - 5.1 mmol/L 3.4  4.3  4.3   Chloride 98 - 111 mmol/L 102  98  103   CO2 22 - 32 mmol/L 25  24  30     Calcium  8.9 - 10.3 mg/dL 8.2  8.8  9.5       Latest Ref Rng & Units 10/13/2023    5:05 AM 10/12/2023    9:31 AM 09/03/2023   11:49 AM  CBC  WBC 4.0 - 10.5 K/uL 3.2  6.5  6.3   Hemoglobin 13.0 - 17.0 g/dL 87.5  85.4  85.5   Hematocrit 39.0 - 52.0 % 37.3  42.6  43.1   Platelets 150 - 400 K/uL 126  156  176.0    Lab Results  Component Value Date   HGBA1C 5.4 08/02/2022    Lab Results  Component Value Date   TSH 2.37 06/05/2022     ROS  Review of Systems  Constitutional: Positive for weight gain.  Cardiovascular:  Positive for leg swelling. Negative for chest pain, dyspnea on exertion, orthopnea and palpitations.   Physical Exam:   VS:  BP (!) 138/92 (BP Location: Left Arm, Patient Position: Sitting)   Pulse 91   Ht 6' 4 (1.93 m)   Wt (!) 314 lb (142.4 kg)   SpO2 92%   BMI 38.22 kg/m    Wt Readings from Last 3 Encounters:  04/24/24 (!) 314 lb (142.4 kg)  02/05/24 (!) 305 lb 6.4 oz (138.5 kg)  10/12/23 298 lb 8.1 oz (135.4 kg)    Physical Exam Constitutional:      Appearance: He is obese.  Neck:     Vascular: No carotid bruit.     Comments: Short neck, unable to determine JVD Cardiovascular:     Rate and Rhythm: Normal rate and regular rhythm.     Pulses: Intact distal pulses.     Heart sounds: Normal heart sounds. No murmur heard.    No gallop.  Pulmonary:     Effort: Pulmonary effort is normal.     Breath sounds: Normal breath sounds.  Abdominal:     General: Bowel sounds are normal.     Palpations: Abdomen is soft.  Musculoskeletal:     Right lower leg: Edema (1-2+ ankle edema and 1+ below knee edema) present.     Left lower leg: Edema (1-2+ ankle edema and 1+ below knee edema) present.    Studies Reviewed: SABRA    CARDIAC CATHETERIZATION 08/01/2022   ECHOCARDIOGRAM LIMITED 08/13/2022  1. Technically difficult study. Left ventricular ejection fraction, by estimation, is 60 to 65%. The left ventricle has normal function. The left ventricle has no regional  wall motion abnormalities. There is moderate left ventricular hypertrophy. Left ventricular diastolic parameters are indeterminate. 2. Right ventricular systolic function was not well visualized. The  right ventricular size is not well visualized. 3. A small pericardial effusion is present.  CABG 08/03/2022: LIMA to LAD, RSVG to PDA, RSVG to PLA     EKG:    EKG Interpretation Date/Time:  Friday April 24 2024 09:36:02 EDT Ventricular Rate:  91 PR Interval:  194 QRS Duration:  114 QT Interval:  372 QTC Calculation: 457 R Axis:   -47  Text Interpretation: EKG 04/24/2024: Normal sinus rhythm with rate of 91 bpm, left atrial enlargement, left anterior fascicular block.  Poor R wave progression, cannot exclude anteroseptal infarct old.  Poor R progression may be related to LAFB.  LVH with repolarization abnormality.  Compared to 10/12/2023, no significant change. Confirmed by Donat Humble, Jagadeesh ([id number]) on 04/24/2024 9:49:12 AM    Medications ordered    Meds ordered this encounter  Medications   spironolactone  (ALDACTONE ) 25 MG tablet    Sig: Take 1 tablet (25 mg total) by mouth every morning.    Dispense:  30 tablet    Refill:  2     ASSESSMENT AND PLAN: .      ICD-10-CM   1. Acute on chronic diastolic heart failure (HCC)  P49.66 EKG 12-Lead    2. Leg edema  R60.0 EKG 12-Lead    spironolactone  (ALDACTONE ) 25 MG tablet    3. Primary hypertension  I10 spironolactone  (ALDACTONE ) 25 MG tablet    4. Class 2 severe obesity due to excess calories with serious comorbidity and body mass index (BMI) of 38.0 to 38.9 in adult [facility])  Z33.187    E66.01    Z68.38      Assessment & Plan Acute on chronic diastolic heart failure Mild leg edema and fluid overload suggest acute on chronic diastolic heart failure, likely exacerbated by dietary habits and weight gain. Lungs are clear, EKG shows no changes from previous, and no significant neck vein distension due to short neck anatomy. Symptoms include  leg swelling and weight gain, with no significant shortness of breath reported. - Prescribe spironolactone  25 mg once daily to manage heart failure and reduce fluid retention gradually to avoid kidney damage. - Order blood tests, including BMP and BNP, to be done in two weeks. - Encourage dietary modifications to reduce salt intake and manage weight.  Patient does not cook, 100% of his diet is either at a restaurant or ordered.  Discussed healthy options.  Suspect symptoms will improve just with dietary modification.  Hypertension Blood pressure is not well controlled, likely due to dietary habits and fluid retention. Spironolactone  is expected to aid in blood pressure management as well as heart failure. - Prescribe spironolactone  25 mg once daily to aid in blood pressure control. - Encourage dietary modifications to reduce salt intake.  Moderate obesity Body mass index is 38, indicating moderate obesity. Weight gain is contributing to heart failure symptoms and hypertension. - Encourage dietary modifications to reduce caloric intake and manage weight. - Advise increased physical activity as tolerated. - Patient pretty much eats out every day or or resolved food  Obstructive sleep apnea Non-compliance with CPAP therapy noted. Discussed the risks of untreated sleep apnea, including potential for stroke, recurrence of atrial fibrillation, and exacerbation of heart failure. - Encourage regular use of CPAP therapy. - Suggest exploring different CPAP masks if current one is uncomfortable.  CAD s/p CABG on 08/03/2022 with LIMA to LAD, SVG to PDA and SVG to PL branch of RCA H/O Post CABG Atrial fibrillation with no recurrence Currently in regular rhythm with no recent  episodes of atrial fibrillation. Continued monitoring is necessary, especially given the history of non-compliance with CPAP therapy, which could increase the risk of recurrence.  Needs to f/u with PCP. Keep scheduled appointment  with Dr. Medford Edwards in 3 to 4 months and will schedule follow-up visit in 4 weeks with one of the team members.SABRA Fus,  Gordy Bergamo, MD, [facility] 04/24/2024, 10:10 AM [facility] [address] [place], [place] [address] Phone: [phone]. Fax:  [phone]

## 2024-04-27 ENCOUNTER — Other Ambulatory Visit: Payer: Self-pay

## 2024-04-30 ENCOUNTER — Other Ambulatory Visit: Payer: Self-pay

## 2024-05-12 DIAGNOSIS — I10 Essential (primary) hypertension: Secondary | ICD-10-CM | POA: Diagnosis not present

## 2024-05-12 DIAGNOSIS — R600 Localized edema: Secondary | ICD-10-CM | POA: Diagnosis not present

## 2024-05-12 DIAGNOSIS — I5033 Acute on chronic diastolic (congestive) heart failure: Secondary | ICD-10-CM | POA: Diagnosis not present

## 2024-05-12 LAB — BASIC METABOLIC PANEL WITH GFR
BUN/Creatinine Ratio: 21 (ref 10–24)
BUN: 21 mg/dL (ref 8–27)
CO2: 19 mmol/L — ABNORMAL LOW (ref 20–29)
Calcium: 9.6 mg/dL (ref 8.6–10.2)
Chloride: 99 mmol/L (ref 96–106)
Creatinine, Ser: 0.98 mg/dL (ref 0.76–1.27)
Glucose: 135 mg/dL — ABNORMAL HIGH (ref 70–99)
Potassium: 4.6 mmol/L (ref 3.5–5.2)
Sodium: 135 mmol/L (ref 134–144)
eGFR: 80 mL/min/1.73 (ref >59)

## 2024-05-13 ENCOUNTER — Ambulatory Visit: Payer: Self-pay | Admitting: *Deleted

## 2024-05-13 ENCOUNTER — Other Ambulatory Visit (HOSPITAL_COMMUNITY): Payer: Self-pay

## 2024-05-13 ENCOUNTER — Other Ambulatory Visit: Payer: Self-pay

## 2024-05-13 LAB — PRO B NATRIURETIC PEPTIDE: NT-Pro BNP: 260 pg/mL (ref 0–486)

## 2024-05-14 ENCOUNTER — Other Ambulatory Visit: Payer: Self-pay

## 2024-05-14 NOTE — Progress Notes (Signed)
 Normal kidney function. Mild elevation in blood sugar non fasting. BNP does not suggest heart failure

## 2024-06-01 ENCOUNTER — Other Ambulatory Visit: Payer: Self-pay | Admitting: Internal Medicine

## 2024-06-01 ENCOUNTER — Other Ambulatory Visit: Payer: Self-pay

## 2024-06-01 ENCOUNTER — Ambulatory Visit (HOSPITAL_COMMUNITY)
Admission: RE | Admit: 2024-06-01 | Discharge: 2024-06-01 | Disposition: A | Discharge status: Home / Self Care | Source: Ambulatory Visit | Attending: Cardiology | Admitting: Cardiology

## 2024-06-01 ENCOUNTER — Ambulatory Visit: Payer: Self-pay | Admitting: Cardiovascular Disease

## 2024-06-01 DIAGNOSIS — I350 Nonrheumatic aortic (valve) stenosis: Secondary | ICD-10-CM | POA: Insufficient documentation

## 2024-06-01 LAB — ECHOCARDIOGRAM COMPLETE
AR max vel: 1.58 cm2
AV Area VTI: 1.62 cm2
AV Area mean vel: 1.63 cm2
AV Mean grad: 21 mmHg
AV Peak grad: 38.2 mmHg
Ao pk vel: 3.09 m/s
Area-P 1/2: 3.53 cm2
S' Lateral: 3 cm

## 2024-06-09 ENCOUNTER — Other Ambulatory Visit (HOSPITAL_COMMUNITY): Payer: Self-pay

## 2024-06-09 ENCOUNTER — Telehealth: Payer: Self-pay | Admitting: Cardiovascular Disease

## 2024-06-09 NOTE — Telephone Encounter (Signed)
 Patients's sister is calling in for the results of the test. Please advise

## 2024-06-09 NOTE — Telephone Encounter (Signed)
 Patient identification verified by 2 forms.   Called and spoke to patient  Patient states:  -Called to go over results -Noted weight gain was 260 before surgery. Now 305 lbs. Over 6 months.   -His belly looks like he swallowed a watermelon.  -Pt's sister notes weakness and SOB, pt states I don't feel good. I have no energy. -Can not exercise due to decreased energy. (Over the past few months).              Interventions/Plan: -Will keep the appt already scheduled. -Will start to monitor weight daily (will obtain a scale able to weigh over 300 pounds.)   Reviewed ED warning signs/precautions  Patient agrees with plan, no questions at this time

## 2024-06-10 ENCOUNTER — Other Ambulatory Visit: Payer: Self-pay

## 2024-06-10 ENCOUNTER — Other Ambulatory Visit (HOSPITAL_COMMUNITY): Payer: Self-pay

## 2024-06-10 ENCOUNTER — Other Ambulatory Visit: Payer: Self-pay | Admitting: Family

## 2024-06-10 MED ORDER — ASPIRIN 81 MG PO TBEC
81.0000 mg | DELAYED_RELEASE_TABLET | Freq: Every day | ORAL | 1 refills | Status: DC
  Filled 2024-06-10: qty 30, 30d supply, fill #0

## 2024-06-10 NOTE — Progress Notes (Signed)
 Cardiology Office Note:  .   Date:  06/23/2024  ID:  Hector Edwards, DOB November 21, 1945, MRN [id number] PCP: [name], FNP  [facility] Providers Cardiologist:  Lonni Cash, MD    History of Present Illness: .   Hector Edwards is a 78 y.o. male with history of CAD s/p CABG on 08/03/2022 with LIMA to LAD, SVG to PDA and SVG to PL branch of RCA, post-operative atrial fibrillation, obesity, HTN, HLD, mild aortic stenosis and sleep apnea not on therapy, is being seen as a sick working for my partner Dr. Cash for evaluation of worsening leg edema and abdominal distention.  A telephone call was made by his sister requesting appointment and also endorsed that patient has not been taking medications as prescribed. Last hospitalization was in January 2025 with COVID infection and acute kidney injury.  Patient saw Dr. Ladona 04/2024 for LE edema. Echo showed normal heart function.  Patient here with his sister. His swelling has improved some with spironolactone . Eats out but has lost his appetite. Complains of chronic DOE. Can walk 250 feet 5 times. Whenever he goes anywhere he has to stop and sit down. Has sleep apnea but doesn't use his CPAP.   ROS:    Studies Reviewed: SABRA    EKG Interpretation Date/Time:  Tuesday June 23 2024 12:28:22 EDT Ventricular Rate:  94 PR Interval:  194 QRS Duration:  122 QT Interval:  360 QTC Calculation: 450 R Axis:   -52  Text Interpretation: Normal sinus rhythm Left anterior fascicular block Left ventricular hypertrophy with QRS widening and repolarization abnormality ( R in aVL , Cornell product ) When compared with ECG of 24-Apr-2024 09:36, No significant change was found Confirmed by Parthenia Edwards [id number] on 06/23/2024 12:31:27 PM    Prior CV Studies:   Echo 05/2024 IMPRESSIONS     1. Significant septal lateral wall dysynergy. Left ventricular ejection  fraction, by estimation, is 50 to 55%. The left ventricle has low  normal  function. The left ventricle has no regional wall motion abnormalities.  There is severe left ventricular  hypertrophy. Left ventricular diastolic parameters are consistent with  Grade I diastolic dysfunction (impaired relaxation).   2. Right ventricular systolic function is normal. The right ventricular  size is normal.   3. Left atrial size was mildly dilated.   4. The mitral valve is abnormal. Trivial mitral valve regurgitation. No  evidence of mitral stenosis.   5. The aortic valve is tricuspid. There is moderate calcification of the  aortic valve. There is moderate thickening of the aortic valve. Aortic  valve regurgitation is not visualized. Mild to moderate aortic valve  stenosis.   6. Aortic dilatation noted. There is moderate dilatation of the aortic  root, measuring 43 mm. There is mild dilatation of the ascending aorta,  measuring 38 mm.   7. The inferior vena cava is normal in size with greater than 50%  respiratory variability, suggesting right atrial pressure of 3 mmHg.   CARDIAC CATHETERIZATION 08/01/2022   ECHOCARDIOGRAM LIMITED 08/13/2022  1. Technically difficult study. Left ventricular ejection fraction, by estimation, is 60 to 65%. The left ventricle has normal function. The left ventricle has no regional wall motion abnormalities. There is moderate left ventricular hypertrophy. Left ventricular diastolic parameters are indeterminate. 2. Right ventricular systolic function was not well visualized. The right ventricular size is not well visualized. 3. A small pericardial effusion is present.   CABG 08/03/2022: LIMA to LAD, RSVG to PDA, RSVG  to PLA     Risk Assessment/Calculations:             Physical Exam:   VS:  BP 130/78   Pulse 100   Ht 6' 4 (1.93 m)   Wt 299 lb (135.6 kg)   SpO2 96%   BMI 36.40 kg/m    Orhtostatics: No data found. Wt Readings from Last 3 Encounters:  06/23/24 299 lb (135.6 kg)  04/24/24 (!) 314 lb (142.4 kg)  02/05/24 (!)  305 lb 6.4 oz (138.5 kg)    GEN: Well nourished, well developed in no acute distress NECK: No JVD; No carotid bruits CARDIAC:  RRR, 2/6 systolic murmur LSB RESPIRATORY:  Decreased breath sounds throughout ABDOMEN: Soft, non-tender, non-distended EXTREMITIES:  No edema; No deformity   ASSESSMENT AND PLAN: .    Chronic diastolic heart failure Edema improved with spironolactone  25 mg once daily to manage heart failure and reduce fluid retention  BNP and bmet were normal. - echo normal LVEF, severe LVH - Encourage dietary modifications to reduce salt intake and manage weight.  Patient does not cook, 100% of his diet is either at a restaurant or ordered.  Discussed healthy options.  -continues with DOE-HR 100/m at rest after walking in here today. -deconditioned-discussed increase exercise.  CAD s/p CABG on 08/03/2022 with LIMA to LAD, SVG to PDA and SVG to PL branch of RCA H/O Post CABG Atrial fibrillation with no recurrence Currently in regular rhythm with no recent episodes of atrial fibrillation.    Hypertension Blood pressure is better controlled on Spironolactone  - Encourage dietary modifications to reduce salt intake.   Moderate obesity Body mass index is 38, indicating moderate obesity. Weight gain is contributing to heart failure symptoms and hypertension. - Encourage dietary modifications to reduce caloric intake and manage weight. - Advise increased physical activity as tolerated. - Patient pretty much eats out every day or or resolved food   Obstructive sleep apnea Non-compliance with CPAP therapy noted. Discussed the risks of untreated sleep apnea, including potential for stroke, recurrence of atrial fibrillation, and exacerbation of heart failure. -refer to pulmonary for sleep re-evaluation             Dispo:    Signed, Olivia Pavy, PA-C

## 2024-06-10 NOTE — Telephone Encounter (Signed)
 Verlin Lonni BIRCH, MD to [facility] Triage (Selected Message)     06/09/24  3:55 PM It looks like he was 296 lbs in September 2023 prior to his surgery and 299 lbs in January 2025. Is he having lower extremity edema or dyspnea? If not, we can keep his appt for 06/23/24 as planned. Chris  Patient identification verified by 2 forms. Spoke with pt's sister, [name] (ok per [facility]) regarding Dr. Santa advise. [name] explains that  she recently noted that pt is probably not taking his medications as prescribed. She notes that she was at his house a few days ago and saw a medication delivery from 8/17 that was unopened. [name] states she was able to set up his pill container correctly to start today. She reports that pt has had issues with lower extremity swelling and shortness of breath when walking short distances. [name] will check in on Mr. Medford more often and will let us  know if she thinks that pt needs to be seen sooner than 9/16. [name] would also like for it to be noted that pt doesn't seem himself. She isn't sure if he is experiencing memory issues, or if issues are because he isn't taking his medications correctly, she says pt seems lost. She will also investigate this more. No further questions at this time.

## 2024-06-11 ENCOUNTER — Other Ambulatory Visit: Payer: Self-pay

## 2024-06-11 DIAGNOSIS — Z008 Encounter for other general examination: Secondary | ICD-10-CM | POA: Diagnosis not present

## 2024-06-12 ENCOUNTER — Other Ambulatory Visit: Payer: Self-pay

## 2024-06-23 ENCOUNTER — Ambulatory Visit: Attending: Physician Assistant | Admitting: Physician Assistant

## 2024-06-23 ENCOUNTER — Other Ambulatory Visit (HOSPITAL_COMMUNITY): Payer: Self-pay

## 2024-06-23 ENCOUNTER — Encounter: Payer: Self-pay | Admitting: Physician Assistant

## 2024-06-23 VITALS — BP 130/78 | HR 100 | Ht 76.0 in | Wt 299.0 lb

## 2024-06-23 DIAGNOSIS — Z6837 Body mass index (BMI) 37.0-37.9, adult: Secondary | ICD-10-CM

## 2024-06-23 DIAGNOSIS — I10 Essential (primary) hypertension: Secondary | ICD-10-CM

## 2024-06-23 DIAGNOSIS — E66812 Obesity, class 2: Secondary | ICD-10-CM | POA: Diagnosis not present

## 2024-06-23 DIAGNOSIS — R600 Localized edema: Secondary | ICD-10-CM | POA: Diagnosis not present

## 2024-06-23 DIAGNOSIS — G4733 Obstructive sleep apnea (adult) (pediatric): Secondary | ICD-10-CM

## 2024-06-23 DIAGNOSIS — I5032 Chronic diastolic (congestive) heart failure: Secondary | ICD-10-CM

## 2024-06-23 DIAGNOSIS — I2510 Atherosclerotic heart disease of native coronary artery without angina pectoris: Secondary | ICD-10-CM | POA: Diagnosis not present

## 2024-06-23 MED ORDER — SPIRONOLACTONE 25 MG PO TABS
25.0000 mg | ORAL_TABLET | ORAL | 3 refills | Status: DC
  Filled 2024-06-23: qty 90, 90d supply, fill #0
  Filled 2024-07-09: qty 30, 30d supply, fill #0
  Filled 2024-08-17: qty 30, 30d supply, fill #1
  Filled 2024-09-11: qty 30, 30d supply, fill #2

## 2024-06-23 NOTE — Patient Instructions (Addendum)
 Thank you for choosing [facility]!     Medication Instructions:  A referral has been placed for Pulmonology for sleep apnea.   *If you need a refill on your cardiac medications before your next appointment, please call your pharmacy*   Lab Work: No labs were ordered during today's visit.  If you have labs (blood work) drawn today and your tests are completely normal, you will receive your results only by: MyChart Message (if you have MyChart) OR A paper copy in the mail If you have any lab test that is abnormal or we need to change your treatment, we will call you to review the results.   Testing/Procedures: No procedures were ordered during today's visit.   Your next appointment:   2 month(s)   Provider:   Dr. Verlin   Follow-Up: At [facility], you and your health needs are our priority.  As part of our continuing mission to provide you with exceptional heart care, we have created designated Provider Care Teams.  These Care Teams include your primary Cardiologist (physician) and Advanced Practice Providers (APPs -  Physician Assistants and Nurse Practitioners) who all work together to provide you with the care you need, when you need it. We recommend signing up for the patient portal called MyChart.  Sign up information is provided on this After Visit Summary.  MyChart is used to connect with patients for Virtual Visits (Telemedicine).  Patients are able to view lab/test results, encounter notes, upcoming appointments, etc.  Non-urgent messages can be sent to your provider as well.   To learn more about what you can do with MyChart, go to [email or web address].

## 2024-06-27 ENCOUNTER — Emergency Department (HOSPITAL_COMMUNITY)

## 2024-06-27 ENCOUNTER — Inpatient Hospital Stay (HOSPITAL_COMMUNITY)

## 2024-06-27 ENCOUNTER — Inpatient Hospital Stay (HOSPITAL_COMMUNITY)
Admission: EM | Admit: 2024-06-27 | Discharge: 2024-06-29 | DRG: 063 | Disposition: A | Discharge status: Home Health | Attending: Student in an Organized Health Care Education/Training Program | Admitting: Student in an Organized Health Care Education/Training Program

## 2024-06-27 DIAGNOSIS — Z818 Family history of other mental and behavioral disorders: Secondary | ICD-10-CM

## 2024-06-27 DIAGNOSIS — R29701 NIHSS score 1: Secondary | ICD-10-CM | POA: Diagnosis not present

## 2024-06-27 DIAGNOSIS — Z808 Family history of malignant neoplasm of other organs or systems: Secondary | ICD-10-CM

## 2024-06-27 DIAGNOSIS — Z8249 Family history of ischemic heart disease and other diseases of the circulatory system: Secondary | ICD-10-CM | POA: Diagnosis not present

## 2024-06-27 DIAGNOSIS — Z888 Allergy status to other drugs, medicaments and biological substances status: Secondary | ICD-10-CM | POA: Diagnosis not present

## 2024-06-27 DIAGNOSIS — I63431 Cerebral infarction due to embolism of right posterior cerebral artery: Secondary | ICD-10-CM | POA: Diagnosis present

## 2024-06-27 DIAGNOSIS — Z79899 Other long term (current) drug therapy: Secondary | ICD-10-CM

## 2024-06-27 DIAGNOSIS — I10 Essential (primary) hypertension: Secondary | ICD-10-CM | POA: Diagnosis not present

## 2024-06-27 DIAGNOSIS — I739 Peripheral vascular disease, unspecified: Secondary | ICD-10-CM

## 2024-06-27 DIAGNOSIS — R29704 NIHSS score 4: Secondary | ICD-10-CM | POA: Diagnosis present

## 2024-06-27 DIAGNOSIS — G459 Transient cerebral ischemic attack, unspecified: Secondary | ICD-10-CM | POA: Diagnosis not present

## 2024-06-27 DIAGNOSIS — R29818 Other symptoms and signs involving the nervous system: Secondary | ICD-10-CM | POA: Diagnosis not present

## 2024-06-27 DIAGNOSIS — I639 Cerebral infarction, unspecified: Principal | ICD-10-CM | POA: Diagnosis present

## 2024-06-27 DIAGNOSIS — G8314 Monoplegia of lower limb affecting left nondominant side: Secondary | ICD-10-CM | POA: Diagnosis not present

## 2024-06-27 DIAGNOSIS — K219 Gastro-esophageal reflux disease without esophagitis: Secondary | ICD-10-CM | POA: Diagnosis not present

## 2024-06-27 DIAGNOSIS — R200 Anesthesia of skin: Secondary | ICD-10-CM | POA: Diagnosis not present

## 2024-06-27 DIAGNOSIS — I6340 Cerebral infarction due to embolism of unspecified cerebral artery: Secondary | ICD-10-CM | POA: Diagnosis not present

## 2024-06-27 DIAGNOSIS — E041 Nontoxic single thyroid nodule: Secondary | ICD-10-CM | POA: Diagnosis not present

## 2024-06-27 DIAGNOSIS — E785 Hyperlipidemia, unspecified: Secondary | ICD-10-CM | POA: Diagnosis not present

## 2024-06-27 DIAGNOSIS — Z7982 Long term (current) use of aspirin: Secondary | ICD-10-CM | POA: Diagnosis not present

## 2024-06-27 DIAGNOSIS — I2510 Atherosclerotic heart disease of native coronary artery without angina pectoris: Secondary | ICD-10-CM | POA: Diagnosis not present

## 2024-06-27 DIAGNOSIS — G4733 Obstructive sleep apnea (adult) (pediatric): Secondary | ICD-10-CM | POA: Diagnosis present

## 2024-06-27 DIAGNOSIS — I6349 Cerebral infarction due to embolism of other cerebral artery: Secondary | ICD-10-CM | POA: Diagnosis not present

## 2024-06-27 DIAGNOSIS — I6523 Occlusion and stenosis of bilateral carotid arteries: Secondary | ICD-10-CM | POA: Diagnosis not present

## 2024-06-27 DIAGNOSIS — I6389 Other cerebral infarction: Secondary | ICD-10-CM | POA: Diagnosis not present

## 2024-06-27 DIAGNOSIS — Z860101 Personal history of adenomatous and serrated colon polyps: Secondary | ICD-10-CM

## 2024-06-27 DIAGNOSIS — Z6836 Body mass index (BMI) 36.0-36.9, adult: Secondary | ICD-10-CM | POA: Diagnosis not present

## 2024-06-27 DIAGNOSIS — R29898 Other symptoms and signs involving the musculoskeletal system: Secondary | ICD-10-CM

## 2024-06-27 DIAGNOSIS — E669 Obesity, unspecified: Secondary | ICD-10-CM | POA: Diagnosis not present

## 2024-06-27 DIAGNOSIS — G319 Degenerative disease of nervous system, unspecified: Secondary | ICD-10-CM | POA: Diagnosis not present

## 2024-06-27 DIAGNOSIS — R531 Weakness: Secondary | ICD-10-CM | POA: Diagnosis not present

## 2024-06-27 DIAGNOSIS — Z800 Family history of malignant neoplasm of digestive organs: Secondary | ICD-10-CM | POA: Diagnosis not present

## 2024-06-27 DIAGNOSIS — I63521 Cerebral infarction due to unspecified occlusion or stenosis of right anterior cerebral artery: Secondary | ICD-10-CM | POA: Diagnosis not present

## 2024-06-27 DIAGNOSIS — I480 Paroxysmal atrial fibrillation: Secondary | ICD-10-CM | POA: Diagnosis present

## 2024-06-27 DIAGNOSIS — I6782 Cerebral ischemia: Secondary | ICD-10-CM | POA: Diagnosis not present

## 2024-06-27 DIAGNOSIS — Z823 Family history of stroke: Secondary | ICD-10-CM | POA: Diagnosis not present

## 2024-06-27 DIAGNOSIS — Z83719 Family history of colon polyps, unspecified: Secondary | ICD-10-CM

## 2024-06-27 DIAGNOSIS — R29810 Facial weakness: Secondary | ICD-10-CM | POA: Diagnosis present

## 2024-06-27 DIAGNOSIS — Z743 Need for continuous supervision: Secondary | ICD-10-CM | POA: Diagnosis not present

## 2024-06-27 DIAGNOSIS — Z803 Family history of malignant neoplasm of breast: Secondary | ICD-10-CM | POA: Diagnosis not present

## 2024-06-27 DIAGNOSIS — Z951 Presence of aortocoronary bypass graft: Secondary | ICD-10-CM

## 2024-06-27 DIAGNOSIS — R4781 Slurred speech: Secondary | ICD-10-CM | POA: Diagnosis present

## 2024-06-27 DIAGNOSIS — I6521 Occlusion and stenosis of right carotid artery: Principal | ICD-10-CM | POA: Diagnosis present

## 2024-06-27 LAB — PROTIME-INR
INR: 1 (ref 0.8–1.2)
Prothrombin Time: 14 s (ref 11.4–15.2)

## 2024-06-27 LAB — CBC
HCT: 40.9 % (ref 39.0–52.0)
Hemoglobin: 13.6 g/dL (ref 13.0–17.0)
MCH: 34.3 pg — ABNORMAL HIGH (ref 26.0–34.0)
MCHC: 33.3 g/dL (ref 30.0–36.0)
MCV: 103.3 fL — ABNORMAL HIGH (ref 80.0–100.0)
Platelets: 256 K/uL (ref 150–400)
RBC: 3.96 MIL/uL — ABNORMAL LOW (ref 4.22–5.81)
RDW: 15.6 % — ABNORMAL HIGH (ref 11.5–15.5)
WBC: 9.9 K/uL (ref 4.0–10.5)
nRBC: 0 % (ref 0.0–0.2)

## 2024-06-27 LAB — COMPREHENSIVE METABOLIC PANEL WITH GFR
ALT: 39 U/L (ref 0–44)
AST: 32 U/L (ref 15–41)
Albumin: 3.7 g/dL (ref 3.5–5.0)
Alkaline Phosphatase: 75 U/L (ref 38–126)
Anion gap: 10 (ref 5–15)
BUN: 10 mg/dL (ref 8–23)
CO2: 21 mmol/L — ABNORMAL LOW (ref 22–32)
Calcium: 9.6 mg/dL (ref 8.9–10.3)
Chloride: 105 mmol/L (ref 98–111)
Creatinine, Ser: 0.87 mg/dL (ref 0.61–1.24)
GFR, Estimated: >60 mL/min (ref >60)
Glucose, Bld: 113 mg/dL — ABNORMAL HIGH (ref 70–99)
Potassium: 3.7 mmol/L (ref 3.5–5.1)
Sodium: 136 mmol/L (ref 135–145)
Total Bilirubin: 1.2 mg/dL (ref 0.0–1.2)
Total Protein: 7.4 g/dL (ref 6.5–8.1)

## 2024-06-27 LAB — I-STAT CHEM 8, ED
BUN: 10 mg/dL (ref 8–23)
Calcium, Ion: 1.18 mmol/L (ref 1.15–1.40)
Chloride: 103 mmol/L (ref 98–111)
Creatinine, Ser: 0.9 mg/dL (ref 0.61–1.24)
Glucose, Bld: 108 mg/dL — ABNORMAL HIGH (ref 70–99)
HCT: 40 % (ref 39.0–52.0)
Hemoglobin: 13.6 g/dL (ref 13.0–17.0)
Potassium: 3.6 mmol/L (ref 3.5–5.1)
Sodium: 138 mmol/L (ref 135–145)
TCO2: 22 mmol/L (ref 22–32)

## 2024-06-27 LAB — HEMOGLOBIN A1C
Hgb A1c MFr Bld: 5.2 % (ref 4.8–5.6)
Mean Plasma Glucose: 102.54 mg/dL

## 2024-06-27 LAB — GLUCOSE, CAPILLARY: Glucose-Capillary: 128 mg/dL — ABNORMAL HIGH (ref 70–99)

## 2024-06-27 LAB — DIFFERENTIAL
Abs Immature Granulocytes: 0.22 K/uL — ABNORMAL HIGH (ref 0.00–0.07)
Basophils Absolute: 0 K/uL (ref 0.0–0.1)
Basophils Relative: 0 %
Eosinophils Absolute: 0 K/uL (ref 0.0–0.5)
Eosinophils Relative: 0 %
Immature Granulocytes: 2 %
Lymphocytes Relative: 22 %
Lymphs Abs: 2.2 K/uL (ref 0.7–4.0)
Monocytes Absolute: 1.4 K/uL — ABNORMAL HIGH (ref 0.1–1.0)
Monocytes Relative: 14 %
Neutro Abs: 6 K/uL (ref 1.7–7.7)
Neutrophils Relative %: 62 %

## 2024-06-27 LAB — ETHANOL: Alcohol, Ethyl (B): <15 mg/dL

## 2024-06-27 LAB — APTT: aPTT: 35 s (ref 24–36)

## 2024-06-27 LAB — CBG MONITORING, ED: Glucose-Capillary: 118 mg/dL — ABNORMAL HIGH (ref 70–99)

## 2024-06-27 LAB — MRSA NEXT GEN BY PCR, NASAL: MRSA by PCR Next Gen: NOT DETECTED

## 2024-06-27 MED ORDER — IOHEXOL 350 MG/ML SOLN
75.0000 mL | Freq: Once | INTRAVENOUS | Status: AC | PRN
  Administered 2024-06-27: 75 mL via INTRAVENOUS

## 2024-06-27 MED ORDER — CHLORHEXIDINE GLUCONATE CLOTH 2 % EX PADS
6.0000 | MEDICATED_PAD | Freq: Every day | CUTANEOUS | Status: DC
  Administered 2024-06-27 – 2024-06-28 (×2): 6 via TOPICAL

## 2024-06-27 MED ORDER — PANTOPRAZOLE SODIUM 40 MG IV SOLR
40.0000 mg | Freq: Every day | INTRAVENOUS | Status: DC
  Administered 2024-06-27 – 2024-06-28 (×2): 40 mg via INTRAVENOUS
  Filled 2024-06-27 (×2): qty 10

## 2024-06-27 MED ORDER — ACETAMINOPHEN 160 MG/5ML PO SOLN: 650.0000 mg | ORAL | Status: DC | PRN

## 2024-06-27 MED ORDER — OXYCODONE-ACETAMINOPHEN 5-325 MG PO TABS
1.0000 | ORAL_TABLET | Freq: Four times a day (QID) | ORAL | Status: DC | PRN
Start: 2024-06-28 — End: 2024-06-29

## 2024-06-27 MED ORDER — SENNOSIDES-DOCUSATE SODIUM 8.6-50 MG PO TABS: 1.0000 | ORAL_TABLET | Freq: Every evening | ORAL | Status: DC | PRN

## 2024-06-27 MED ORDER — TENECTEPLASE FOR STROKE
25.0000 mg | PACK | Freq: Once | INTRAVENOUS | Status: AC
  Administered 2024-06-27: 25 mg via INTRAVENOUS
  Filled 2024-06-27: qty 10

## 2024-06-27 MED ORDER — STROKE: EARLY STAGES OF RECOVERY BOOK
Freq: Once | Status: AC
  Filled 2024-06-27: qty 1

## 2024-06-27 MED ORDER — ACETAMINOPHEN 650 MG RE SUPP: 650.0000 mg | RECTAL | Status: DC | PRN

## 2024-06-27 MED ORDER — SODIUM CHLORIDE 0.9 % IV SOLN: INTRAVENOUS | Status: AC

## 2024-06-27 MED ORDER — ORAL CARE MOUTH RINSE: 15.0000 mL | OROMUCOSAL | Status: DC | PRN

## 2024-06-27 MED ORDER — ACETAMINOPHEN 325 MG PO TABS
650.0000 mg | ORAL_TABLET | ORAL | Status: DC | PRN
  Administered 2024-06-27: 650 mg via ORAL
  Filled 2024-06-27: qty 2

## 2024-06-27 MED ORDER — SODIUM CHLORIDE 0.9% FLUSH
3.0000 mL | Freq: Once | INTRAVENOUS | Status: AC
  Administered 2024-06-27: 3 mL via INTRAVENOUS

## 2024-06-27 NOTE — Code Documentation (Signed)
 Stroke Response Nurse Documentation Code Documentation  QUINNLAN ABRUZZO is a 78 y.o. male arriving to [facility]  via [facility] on 06/27/24 with past medical hx of HTN, HLD, CAD. On aspirin  81 mg daily. Code stroke was activated by EMS.   Patient had driven to the gas station where he was LKW at 1615 and now complaining of sudden onset of left sided weakness. Upon trying to get out of his car, at the gas station, he was unable to use his left arm.   Stroke team at the bedside on patient arrival. Labs drawn and patient cleared for CT by Dr. Cleotis. Patient to CT with team.   NIHSS 4, see documentation for details and code stroke times. Patient with left facial droop, left arm weakness, left decreased sensation, and dysarthria  on exam.   The following imaging was completed:  CT Head and CTA. Patient is a candidate for IV Thrombolytic. Patient is not a candidate for IR due to imaging and exam negative for LVO.   Care Plan:  Q63min x 2 hours, q30min x 6 hours, q1h x 16 hours until 24 hour mark  BP < 180/105  Call Neuro for: New Headache, worsening symptoms, bleeding, nausea/vomiting, or any signs of angioedema.  Process Delays Noted: no IV access, difficult IV start. Required US  guided IV by EDP.   Bedside handoff with ED RN Clotilda.    Brian Zeitlin L Chandler Stofer  Rapid Response RN

## 2024-06-27 NOTE — ED Notes (Signed)
 Pt transported to IP floor

## 2024-06-27 NOTE — H&P (Signed)
 NEUROLOGY H&P NOTE   Date of service: June 27, 2024 Patient Name: Hector Edwards MRN:  [id number] DOB:  [birth year] Chief Complaint: Left-sided weakness-sudden onset-code stroke  History of Present Illness  Hector Edwards is a 78 y.o. male with hx of hypertension, hyperlipidemia, CAD status post CABG, presenting for evaluation of sudden onset of left-sided weakness.  Last known well at 4:15 PM when he pulled into a gas station where he usually goes, and upon trying to get out from his car, was unable to use the left arm.  Mild facial droop, slurred speech, not much of left leg weakness. Evaluated by EMS-.  Met their criteria for stroke activation, code stroke activated and brought to [facility] Stat CT head negative.  Significant delay in getting IV access (tried and failed by multiple nurses and then the ED provider with ultrasound to get IV access ) prior to thrombolytic administration.  Last known well: 1615 hrs. Modified rankin score: 0-Completely asymptomatic and back to baseline post- stroke IV Thrombolysis: Yes Thrombectomy: No-no ELVO NIHSS components Score: Comment  1a Level of Conscious 0[x]  1[]  2[]  3[]      1b LOC Questions 0[x]  1[]  2[]       1c LOC Commands 0[x]  1[]  2[]       2 Best Gaze 0[x]  1[]  2[]       3 Visual 0[x]  1[]  2[]  3[]      4 Facial Palsy 0[]  1[x]  2[]  3[]      5a Motor Arm - left 0[]  1[x]  2[]  3[]  4[]  UN[]    5b Motor Arm - Right 0[x]  1[]  2[]  3[]  4[]  UN[]    6a Motor Leg - Left 0[x]  1[]  2[]  3[]  4[]  UN[]    6b Motor Leg - Right 0[x]  1[]  2[]  3[]  4[]  UN[]    7 Limb Ataxia 0[x]  1[]  2[]  UN[]      8 Sensory 0[]  1[x]  2[]  UN[]      9 Best Language 0[x]  1[]  2[]  3[]      10 Dysarthria 0[]  1[x]  2[]  UN[]      11 Extinct. and Inattention 0[x]  1[]  2[]       TOTAL: 4      ROS  Comprehensive ROS performed and pertinent positives documented in the HPI  Past History   Past Medical History:  Diagnosis Date   Depression    GERD (gastroesophageal reflux disease)     Hx of adenomatous colonic polyps 09/04/2015   Hyperlipidemia    Hypertension    LBP (low back pain)    OSA (obstructive sleep apnea) 08/01/2018   Sleep apnea    wears cpap    Past Surgical History:  Procedure Laterality Date   CARDIOVERSION N/A 08/08/2022   Procedure: CARDIOVERSION;  Surgeon: Raford Riggs, MD;  Location: [facility] ENDOSCOPY;  Service: Cardiovascular;  Laterality: N/A;   COLONOSCOPY  2004, 2021   hyperplastic polyps x 2   CORONARY ARTERY BYPASS GRAFT N/A 08/03/2022   Procedure: CORONARY ARTERY BYPASS GRAFTING (CABG) 3X, USING RIGHT GREATER SAPHENOUS VEIN AND LEFT INTERNAL MAMMARY ARTERY.;  Surgeon: Lucas Dorise POUR, MD;  Location: [facility] OR;  Service: Open Heart Surgery;  Laterality: N/A;   KNEE ARTHROSCOPY Right    LEFT HEART CATH AND CORONARY ANGIOGRAPHY N/A 08/01/2022   Procedure: LEFT HEART CATH AND CORONARY ANGIOGRAPHY;  Surgeon: Verlin Lonni BIRCH, MD;  Location: [facility] INVASIVE CV LAB;  Service: Cardiovascular;  Laterality: N/A;   PENILE PROSTHESIS IMPLANT N/A 01/25/2020   Procedure: PENILE PROTHESIS INFLATABLE COLOPLAST;  Surgeon: Carolee Sherwood BIRCH DOUGLAS, MD;  Location: [facility];  Service: Urology;  Laterality: N/A;   SHOULDER ARTHROSCOPY W/ ROTATOR CUFF REPAIR Left    TEE WITHOUT CARDIOVERSION N/A 08/03/2022   Procedure: TRANSESOPHAGEAL ECHOCARDIOGRAM (TEE);  Surgeon: Lucas Dorise POUR, MD;  Location: [facility] OR;  Service: Open Heart Surgery;  Laterality: N/A;   TEE WITHOUT CARDIOVERSION N/A 08/08/2022   Procedure: TRANSESOPHAGEAL ECHOCARDIOGRAM (TEE);  Surgeon: Raford Riggs, MD;  Location: [facility] ENDOSCOPY;  Service: Cardiovascular;  Laterality: N/A;   TONSILLECTOMY  as child   vocal cord growth removed  age 24   benign   WISDOM TOOTH EXTRACTION     Family History  Problem Relation Age of Onset   Stroke Mother 10   Depression Mother    Hypertension Father    Heart disease Father    Colon polyps Father    Pancreatic cancer Brother    Colon polyps Brother     Throat cancer Brother    Breast cancer Daughter    Hypertension Other    Colon cancer Neg Hx    Esophageal cancer Neg Hx    Stomach cancer Neg Hx    Rectal cancer Neg Hx    Social History   Socioeconomic History   Marital status: Divorced    Spouse name: Not on file   Number of children: 1   Years of education: Not on file   Highest education level: Not on file  Occupational History   Occupation: Retired   Occupation: Retired-mailman  Tobacco Use   Smoking status: Never   Smokeless tobacco: Never   Tobacco comments:    Never smoke 08/22/22  Vaping Use   Vaping status: Never Used  Substance and Sexual Activity   Alcohol use: Not Currently    Alcohol/week: 4.0 standard drinks of alcohol    Types: 4 Cans of beer per week    Comment: stop drinking 08/22/22   Drug use: No   Sexual activity: Not Currently  Other Topics Concern   Not on file  Social History Narrative   Regular exercise- yes, seldom; working physically all the time.   Social Drivers of Corporate investment banker Strain: Low Risk  (03/06/2022)   Overall Financial Resource Strain (CARDIA)    Difficulty of Paying Living Expenses: Not hard at all  Food Insecurity: No Food Insecurity (10/12/2023)   Hunger Vital Sign    Worried About Running Out of Food in the Last Year: Never true    Ran Out of Food in the Last Year: Never true  Transportation Needs: No Transportation Needs (10/12/2023)   PRAPARE - Administrator, Civil Service (Medical): No    Lack of Transportation (Non-Medical): No  Physical Activity: Inactive (03/06/2022)   Exercise Vital Sign    Days of Exercise per Week: 0 days    Minutes of Exercise per Session: 0 min  Stress: No Stress Concern Present (03/06/2022)   [employer] of Occupational Health - Occupational Stress Questionnaire    Feeling of Stress : Not at all  Social Connections: Moderately Integrated (10/13/2023)   Social Connection and Isolation Panel    Frequency of  Communication with Friends and Family: More than three times a week    Frequency of Social Gatherings with Friends and Family: More than three times a week    Attends Religious Services: 1 to 4 times per year    Active Member of [employer] or Organizations: Yes    Attends Banker Meetings: More than 4 times per year    Marital Status:  Divorced   Allergies  Allergen Reactions   Hctz [Hydrochlorothiazide] Shortness Of Breath   Enalapril Maleate     cramps   Tribenzor [Olmesartan -Amlodipine -Hctz] Swelling    Fatigue, CP, edema   Verapamil      Fatigue    Medications   Current Facility-Administered Medications:    [START ON 06/28/2024]  stroke: early stages of recovery book, , Does not apply, Once, Voncile Isles, MD   0.9 %  sodium chloride  infusion, , Intravenous, Continuous, Shauntavia Brackin, MD   acetaminophen  (TYLENOL ) tablet 650 mg, 650 mg, Oral, Q4H PRN **OR** acetaminophen  (TYLENOL ) 160 MG/5ML solution 650 mg, 650 mg, Per Tube, Q4H PRN **OR** acetaminophen  (TYLENOL ) suppository 650 mg, 650 mg, Rectal, Q4H PRN, Kaysia Willard, MD   pantoprazole  (PROTONIX ) injection 40 mg, 40 mg, Intravenous, QHS, Zayvion Stailey, MD   senna-docusate (Senokot-S) tablet 1 tablet, 1 tablet, Oral, QHS PRN, Cheridan Kibler, MD   sodium chloride  flush (NS) 0.9 % injection 3 mL, 3 mL, Intravenous, Once, Tegeler, Lonni PARAS, MD   tenecteplase  (TNKASE ) injection for Stroke 25 mg, 25 mg, Intravenous, Once, Voncile Isles, MD  Current Outpatient Medications:    alfuzosin  (UROXATRAL ) 10 MG 24 hr tablet, Take 1 tablet (10 mg total) by mouth daily with breakfast., Disp: 30 tablet, Rfl: 0   aspirin  EC (ASPIRIN  LOW DOSE) 81 MG tablet, Take 1 tablet (81 mg total) by mouth daily with breakfast. Swallow whole., Disp: 30 tablet, Rfl: 1   atorvastatin  (LIPITOR ) 80 MG tablet, Take 1 tablet (80 mg total) by mouth daily with breakfast., Disp: 90 tablet, Rfl: 3   benazepril  (LOTENSIN ) 20 MG tablet, Take 1 tablet (20 mg  total) by mouth daily with supper., Disp: 90 tablet, Rfl: 3   diltiazem  (CARDIZEM  CD) 180 MG 24 hr capsule, Take 1 capsule (180 mg total) by mouth every morning., Disp: 90 capsule, Rfl: 3   guaiFENesin -dextromethorphan (ROBITUSSIN DM) 100-10 MG/5ML syrup, Take 5 mLs by mouth every 4 (four) hours as needed for cough., Disp: 118 mL, Rfl: 0   pantoprazole  (PROTONIX ) 40 MG tablet, Take 1 tablet (40 mg total) by mouth every evening. Please make appointment for refills, Disp: 90 tablet, Rfl: 3   spironolactone  (ALDACTONE ) 25 MG tablet, Take 1 tablet (25 mg total) by mouth every morning., Disp: 90 tablet, Rfl: 3   Vitals   Vitals:   06/27/24 1700  Weight: 135.6 kg  Body mass index is 36.39 kg/m.  Physical Exam   Constitutional: Appears well-developed and well-nourished.  Psych: Affect appropriate to situation.  Eyes: No scleral injection.  HENT: No OP obstruction.  Head: Normocephalic.  Cardiovascular: Normal rate and regular rhythm. Respiratory: Effort normal, non-labored breathing.  GI: Soft.  No distension. There is no tenderness.  Skin: WDI.   Neurologic Examination  Awake alert oriented x 3 Mild dysarthria No aphasia Cranial nerves: Pupils equal round react light, extraocular movements intact, visual fields full, mild facial asymmetry at rest with left lower facial weakness which he is able to overcome when he smiles, tongue and palate midline. Motor examination reveals drift in the left upper extremity worse on the left hand than proximally.  Left leg appears a little weaker but there is no vertical drift.  Right side full strength Sensation diminished to light touch on left arm in comparison to the right Coordination examination with no significant findings disproportionate to weakness of  Labs   CBC:  Recent Labs  Lab 06/27/24 1717 06/27/24 1723  WBC 9.9  --   NEUTROABS 6.0  --  HGB 13.6 13.6  HCT 40.9 40.0  MCV 103.3*  --   PLT 256  --    Basic Metabolic Panel:   Lab Results  Component Value Date   NA 138 06/27/2024   K 3.6 06/27/2024   CO2 19 (L) 05/12/2024   GLUCOSE 108 (H) 06/27/2024   BUN 10 06/27/2024   CREATININE 0.90 06/27/2024   CALCIUM  9.6 05/12/2024   GFRNONAA >60 10/13/2023   GFRAA 103 06/16/2019   Lipid Panel:  Lab Results  Component Value Date   LDLCALC 44 09/03/2023   HgbA1c:  Lab Results  Component Value Date   HGBA1C 5.4 08/02/2022   INR  Lab Results  Component Value Date   INR 1.0 10/12/2023   APTT  Lab Results  Component Value Date   APTT 32 10/12/2023     CT Head without contrast(Personally reviewed): No bleed.  Aspects 10.  CT angio Head and Neck with contrast(Personally reviewed): Prominent soft plaque at right ICA bulb.  Short segment severe stenosis versus near occlusion of the V4 segment of the distal left V4.  Assessment   Hector Edwards is a 78 y.o. male past medical history as above presenting with sudden onset of left face and left arm weakness along with slurred speech.  On examination he also had mild left-sided sensory deficit.  CT head with no acute findings.  CT head reviewed personally prior to decision for IV thrombolysis.  Risk-benefit alternatives discussed with the patient. Significant amount of delay due to inability to get IV access but eventually IV access obtained with ultrasound guidance by the EDP and IV TNKase  given.  CT angiography head and neck negative for LVO-there was prominent soft plaque at the right ICA bulb and short segment severe stenosis versus near occlusion of the V4 segment of the distal left vertebral artery.  Will be admitted to ICU for post TNK care  Impression: Acute ischemic infarct-either a small vessel etiology in the brainstem or corona radiata or internal capsule or small embolic infarct involving more of the hand knob area  Recommendations  Admit to ICU Frequent neurochecks and vitals per the post TNK protocol-ordered Telemetry MRI brain 24 hours 2D  echo A1c Lipid panel No antiplatelets or anticoagulants for 24 hours from IV TNKase  administration until 24-hour imaging is negative for bleed. Check BMP again in the morning.  Electrolyte replacement as needed. Check UA and chest x-ray Blood pressure goal less than 180/105.  Use labetalol  and hydralazine  as needed and if drip required, use Cleviprex. Avoid hypotension  PPI for GI prophylaxis Docusate senna for bowel SCDs for DVT prophylaxis  Full code  Plan discussed with ED provider Dr. Ginger, patient and family at bedside.  ______________________________________________________________________   Bonney Eligio Lav, MD [facility]  Risks, benefits and alternatives of IVT discussed with patient and/or family and they agreed. CTH personally reviewed prior to TNK administration   CRITICAL CARE ATTESTATION Performed by: Eligio Lav, MD Total critical care time: 40 minutes Critical care time was exclusive of separately billable procedures and treating other patients and/or supervising APPs/Residents/Students Critical care was necessary to treat or prevent imminent or life-threatening deterioration. This patient is critically ill and at significant risk for neurological worsening and/or death and care requires constant monitoring. Critical care was time spent personally by me on the following activities: development of treatment plan with patient and/or surrogate as well as nursing, discussions with consultants, evaluation of patient's response to treatment, examination of patient, obtaining history from patient  or surrogate, ordering and performing treatments and interventions, ordering and review of laboratory studies, ordering and review of radiographic studies, pulse oximetry, re-evaluation of patient's condition, participation in multidisciplinary rounds and medical decision making of high complexity in the care of this patient.

## 2024-06-27 NOTE — Progress Notes (Signed)
 PHARMACIST CODE STROKE RESPONSE  Notified to mix TNK at 1722 by Dr. Voncile TNK preparation completed at 1723  TNK dose = 25 mg IV over 5 seconds.   Issues/delays encountered (if applicable): IV access req. Ultrasound guided   Dorn Poot 06/27/24 5:27 PM

## 2024-06-27 NOTE — ED Provider Notes (Signed)
 [place] EMERGENCY DEPARTMENT AT [facility] Provider Note   CSN: [id number] Arrival date & time: 06/27/24  8286     Patient presents with: No chief complaint on file.   Hector Edwards is a 78 y.o. male.   The history is provided by the patient, medical records and the EMS personnel. No language interpreter was used.  Neurologic Problem This is a new problem. The current episode started 1 to 2 hours ago. The problem occurs constantly. The problem has been gradually worsening. Pertinent negatives include no chest pain, no abdominal pain, no headaches and no shortness of breath. Nothing aggravates the symptoms. Nothing relieves the symptoms. He has tried nothing for the symptoms. The treatment provided no relief.       Prior to Admission medications   Medication Sig Start Date End Date Taking? Authorizing Provider  alfuzosin  (UROXATRAL ) 10 MG 24 hr tablet Take 1 tablet (10 mg total) by mouth daily with breakfast. 01/21/24   [name], FNP  aspirin  EC (ASPIRIN  LOW DOSE) 81 MG tablet Take 1 tablet (81 mg total) by mouth daily with breakfast. Swallow whole. 06/10/24   [name], FNP  atorvastatin  (LIPITOR ) 80 MG tablet Take 1 tablet (80 mg total) by mouth daily with breakfast. 03/09/24   Verlin Lonni BIRCH, MD  benazepril  (LOTENSIN ) 20 MG tablet Take 1 tablet (20 mg total) by mouth daily with supper. 03/09/24   Verlin Lonni BIRCH, MD  diltiazem  (CARDIZEM  CD) 180 MG 24 hr capsule Take 1 capsule (180 mg total) by mouth every morning. 03/09/24   Verlin Lonni BIRCH, MD  guaiFENesin -dextromethorphan (ROBITUSSIN DM) 100-10 MG/5ML syrup Take 5 mLs by mouth every 4 (four) hours as needed for cough. 10/13/23   Pokhrel, Laxman, MD  pantoprazole  (PROTONIX ) 40 MG tablet Take 1 tablet (40 mg total) by mouth every evening. Please make appointment for refills 09/19/23   [name], FNP  spironolactone  (ALDACTONE ) 25 MG tablet Take 1 tablet (25 mg total)  by mouth every morning. 06/23/24   Parthenia Olivia HERO, PA-C    Allergies: Hctz [hydrochlorothiazide], Enalapril maleate, Tribenzor [olmesartan -amlodipine -hctz], and Verapamil     Review of Systems  Constitutional:  Negative for chills and fever.  HENT:  Negative for ear pain and sore throat.   Eyes:  Negative for pain and visual disturbance.  Respiratory:  Negative for cough and shortness of breath.   Cardiovascular:  Negative for chest pain and palpitations.  Gastrointestinal:  Negative for abdominal pain, constipation, diarrhea, nausea and vomiting.  Genitourinary:  Negative for dysuria, flank pain and hematuria.  Musculoskeletal:  Negative for arthralgias, back pain and neck pain.  Skin:  Negative for color change and rash.  Neurological:  Positive for facial asymmetry, speech difficulty, weakness and numbness. Negative for dizziness, seizures, syncope, light-headedness and headaches.  Psychiatric/Behavioral:  Negative for agitation.   All other systems reviewed and are negative.   Updated Vital Signs Wt 135.6 kg   BMI 36.39 kg/m   Physical Exam Vitals and nursing note reviewed.  Constitutional:      General: He is not in acute distress.    Appearance: He is well-developed. He is not ill-appearing, toxic-appearing or diaphoretic.  HENT:     Head: Normocephalic and atraumatic.     Nose: No congestion or rhinorrhea.     Mouth/Throat:     Mouth: Mucous membranes are moist.  Eyes:     Conjunctiva/sclera: Conjunctivae normal.  Cardiovascular:     Rate and Rhythm: Normal rate  and regular rhythm.     Heart sounds: No murmur heard. Pulmonary:     Effort: Pulmonary effort is normal. No respiratory distress.     Breath sounds: Normal breath sounds. No wheezing, rhonchi or rales.  Chest:     Chest wall: No tenderness.  Abdominal:     General: Abdomen is flat.     Palpations: Abdomen is soft.     Tenderness: There is no abdominal tenderness.  Musculoskeletal:        General: No  swelling.     Cervical back: Neck supple.  Skin:    General: Skin is warm and dry.     Capillary Refill: Capillary refill takes less than 2 seconds.  Neurological:     Mental Status: He is alert.     Sensory: Sensory deficit present.     Motor: Weakness present.     (all labs ordered are listed, but only abnormal results are displayed) Labs Reviewed  CBG MONITORING, ED - Abnormal; Notable for the following components:      Result Value   Glucose-Capillary 118 (*)    All other components within normal limits  PROTIME-INR  APTT  CBC  DIFFERENTIAL  COMPREHENSIVE METABOLIC PANEL WITH GFR  ETHANOL  I-STAT CHEM 8, ED    EKG: None  Radiology: No results found.   Procedures   EMERGENCY DEPARTMENT  US  GUIDANCE EXAM Emergency Ultrasound:  US  Guidance for Needle Guidance  INDICATIONS: Difficult vascular access Linear probe used in real-time to visualize location of needle entry through skin.   PERFORMED BY: Myself IMAGES ARCHIVED?: No LIMITATIONS: body habitus VIEWS USED: Transverse INTERPRETATION: Needle visualized within vein, Left arm, and Needle gauge 18  Medications Ordered in the ED  sodium chloride  flush (NS) 0.9 % injection 3 mL (has no administration in time range)  tenecteplase  (TNKASE ) injection for Stroke 25 mg (has no administration in time range)   CRITICAL CARE Performed by: Lonni PARAS Naheem Mosco Total critical care time: 20 minutes Critical care time was exclusive of separately billable procedures and treating other patients. Critical care was necessary to treat or prevent imminent or life-threatening deterioration. Critical care was time spent personally by me on the following activities: development of treatment plan with patient and/or surrogate as well as nursing, discussions with consultants, evaluation of patient's response to treatment, examination of patient, obtaining history from patient or surrogate, ordering and performing treatments and  interventions, ordering and review of laboratory studies, ordering and review of radiographic studies, pulse oximetry and re-evaluation of patient's condition.                                   Medical Decision Making Amount and/or Complexity of Data Reviewed Labs: ordered. Radiology: ordered.  Risk Decision regarding hospitalization.    JAMISEN DUERSON is a 78 y.o. male with a past medical history significant for hypertension, hyperlipidemia, GERD, atrial fibrillation not on anticoagulation and CAD status post PCI who presents as a code stroke.  According to patient and EMS report, he was last normal at 4:15 PM after driving his Corvette.  He reportedly pulled over at a gas station and immediately had weakness in his left arm.  There is also numbness in his left arm and left face.  He was noted to have facial droop slightly.  He does not have a history of stroke per his chart and he says that he was feeling at his  baseline before symptoms began.  He he was also noted to have some dysarthria.  No other complaints reported.  On exam, lungs clear.  Chest nontender.  Abdomen nontender.  Patient otherwise well-appearing but does have numbness and weakness in left face and left arm.  Leg had intact sensation and strength.  Intact pulses.  Ultrasound-guided IV was performed by me for imaging needs.  Initial head CT did not show bleed and he will get TNK and be admitted to the neuro ICU for acute stroke.  He will be admitted for further management.      Final diagnoses:  Cerebrovascular accident (CVA), unspecified mechanism (HCC)  Left arm weakness  Facial droop     Clinical Impression: 1. Cerebrovascular accident (CVA), unspecified mechanism (HCC)   2. Left arm weakness   3. Facial droop     Disposition: Admit  This note was prepared with assistance of Dragon voice recognition software. Occasional wrong-word or sound-a-like substitutions may have occurred due to the inherent  limitations of voice recognition software.      Josyah Achor, Lonni PARAS, MD 06/27/24 [phone]

## 2024-06-27 NOTE — ED Triage Notes (Signed)
 Pt bib ems from gas station. Pt was about to get out the car when his left arm went limp.   LKW 16:15  Deficits left side weakness with left sided facial droop.   GCS 15 BP 134/78 HR 90 RA 96%  CBG 115

## 2024-06-28 ENCOUNTER — Inpatient Hospital Stay (HOSPITAL_COMMUNITY)

## 2024-06-28 ENCOUNTER — Other Ambulatory Visit: Payer: Self-pay

## 2024-06-28 ENCOUNTER — Encounter (HOSPITAL_COMMUNITY): Payer: Self-pay | Admitting: Student in an Organized Health Care Education/Training Program

## 2024-06-28 DIAGNOSIS — I6389 Other cerebral infarction: Secondary | ICD-10-CM | POA: Diagnosis not present

## 2024-06-28 LAB — ECHOCARDIOGRAM COMPLETE
AR max vel: 1.4 cm2
AV Area VTI: 1.46 cm2
AV Area mean vel: 1.29 cm2
AV Mean grad: 25 mmHg
AV Peak grad: 42.8 mmHg
Ao pk vel: 3.27 m/s
Area-P 1/2: 3.53 cm2
Calc EF: 69.8 %
S' Lateral: 2.3 cm
Single Plane A2C EF: 74.1 %
Single Plane A4C EF: 64.9 %
Weight: 4783.1 [oz_av]

## 2024-06-28 LAB — LIPID PANEL
Cholesterol: 76 mg/dL (ref 0–200)
HDL: 29 mg/dL — ABNORMAL LOW (ref >40)
LDL Cholesterol: 37 mg/dL (ref 0–99)
Total CHOL/HDL Ratio: 2.6 ratio
Triglycerides: 52 mg/dL (ref <150)
VLDL: 10 mg/dL (ref 0–40)

## 2024-06-28 MED ORDER — INFLUENZA VAC SPLIT HIGH-DOSE 0.5 ML IM SUSY: 0.5000 mL | PREFILLED_SYRINGE | INTRAMUSCULAR | Status: DC | PRN

## 2024-06-28 NOTE — Evaluation (Signed)
 Occupational Therapy Evaluation Patient Details Name: Hector Edwards MRN: [id number] DOB: 02-29-1946 Today's Date: 06/28/2024   History of Present Illness   77 yo male presents to [facility] 9/20 code CVA for LUE>LLE and L facial weakness. CTH negative for acute findings, CTA shows prominent soft plaque at the R ICA bulb and short segment severe stenosis versus near occlusion of the V4 segment of the distal L vertebral artery, await MRI. TNK given 9/20 1700. PMH significant for CAD s/p CABG x3 2023, depression, GERD, HTN, HLD, low back pain, OSA.     Clinical Impressions Pt ind at baseline with ADLs/functional mobility, lives alone but reports he will have PRN assist from family/friends at d/c. Pt currently needs up to min A for ADLs, CGA for bed mobility and CGA for transfers without ADL. Pt with LUE distal vs proximal weakness, coordination and strength deficits noted, provided with squeeze foam and fine motor coordination exercises. Pt presenting with impairments listed below, will follow acutely. Recommend OP OT at d/c.      If plan is discharge home, recommend the following:   A little help with walking and/or transfers;A little help with bathing/dressing/bathroom;Assistance with cooking/housework;Direct supervision/assist for medications management;Direct supervision/assist for financial management;Assist for transportation;Help with stairs or ramp for entrance     Functional Status Assessment   Patient has had a recent decline in their functional status and demonstrates the ability to make significant improvements in function in a reasonable and predictable amount of time.     Equipment Recommendations   Tub/shower seat     Recommendations for Other Services   PT consult     Precautions/Restrictions   Precautions Precautions: Fall Restrictions Weight Bearing Restrictions Per Provider Order: No     Mobility Bed Mobility Overal bed mobility: Needs Assistance Bed  Mobility: Supine to Sit     Supine to sit: Contact guard          Transfers Overall transfer level: Needs assistance Equipment used: None Transfers: Sit to/from Stand Sit to Stand: Contact guard assist                  Balance Overall balance assessment: Needs assistance Sitting-balance support: No upper extremity supported, Feet supported Sitting balance-Leahy Scale: Fair     Standing balance support: No upper extremity supported, During functional activity Standing balance-Leahy Scale: Fair                             ADL either performed or assessed with clinical judgement   ADL Overall ADL's : Needs assistance/impaired Eating/Feeding: Set up   Grooming: Minimal assistance   Upper Body Bathing: Minimal assistance   Lower Body Bathing: Minimal assistance   Upper Body Dressing : Minimal assistance   Lower Body Dressing: Minimal assistance   Toilet Transfer: Contact guard assist   Toileting- Clothing Manipulation and Hygiene: Contact guard assist       Functional mobility during ADLs: Contact guard assist       Vision   Vision Assessment?: No apparent visual deficits     Perception Perception: Not tested       Praxis Praxis: Not tested       Pertinent Vitals/Pain Pain Assessment Pain Assessment: No/denies pain     Extremity/Trunk Assessment Upper Extremity Assessment Upper Extremity Assessment: Generalized weakness   Lower Extremity Assessment Lower Extremity Assessment: Defer to PT evaluation LLE Deficits / Details: 4/5 hip flexion, 4/5 knee flex/ext, 3+/5 hip abd/add  Cervical / Trunk Assessment Cervical / Trunk Assessment: Other exceptions Cervical / Trunk Exceptions: abd obesity   Communication Communication Communication: No apparent difficulties   Cognition Arousal: Alert Behavior During Therapy: Flat affect Cognition: No apparent impairments             OT - Cognition Comments: reports some baseline  memory deficits                 Following commands: Intact       Cueing  General Comments   Cueing Techniques: Verbal cues;Gestural cues  VSS on RA   Exercises     Shoulder Instructions      Home Living Family/patient expects to be discharged to:: Private residence Living Arrangements: Alone Available Help at Discharge: Family;Friend(s);Available PRN/intermittently Type of Home: House Home Access: Level entry     Home Layout: Two level;Able to live on main level with bedroom/bathroom     Bathroom Shower/Tub: Chief Strategy Officer: Standard     Home Equipment: Other (comment)   Additional Comments: adjustable bed      Prior Functioning/Environment Prior Level of Function : Independent/Modified Independent;Driving             Mobility Comments: endorses no recent falls, 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 that cleans for him but could do it himself he states      OT Problem List: Decreased strength;Decreased range of motion;Decreased activity tolerance;Impaired balance (sitting and/or standing);Impaired UE functional use;Impaired sensation   OT Treatment/Interventions: Self-care/ADL training;Therapeutic exercise;Energy conservation;DME and/or AE instruction;Therapeutic activities;Patient/family education;Balance training      OT Goals(Current goals can be found in the care plan section)   Acute Rehab OT Goals Patient Stated Goal: none stated OT Goal Formulation: With patient Time For Goal Achievement: 07/12/24 Potential to Achieve Goals: Good ADL Goals Pt Will Perform Grooming: Independently;standing Pt Will Perform Upper Body Dressing: with supervision;sitting;standing Pt Will Perform Lower Body Dressing: with supervision;sit to/from stand;sitting/lateral leans Pt Will Perform Tub/Shower Transfer: Tub transfer;Shower transfer;with supervision;ambulating Pt/caregiver will Perform Home Exercise Program: Increased ROM;Increased strength;Left upper  extremity;With written HEP provided;With Supervision   OT Frequency:  Min 2X/week    Co-evaluation              AM-PAC OT 6 Clicks Daily Activity     Outcome Measure Help from another person eating meals?: A Little Help from another person taking care of personal grooming?: A Little Help from another person toileting, which includes using toliet, bedpan, or urinal?: A Little Help from another person bathing (including washing, rinsing, drying)?: A Little Help from another person to put on and taking off regular upper body clothing?: A Little Help from another person to put on and taking off regular lower body clothing?: A Little 6 Click Score: 18   End of Session Equipment Utilized During Treatment: Gait belt Nurse Communication: Mobility status  Activity Tolerance: Patient tolerated treatment well Patient left: in bed;with call bell/phone within reach  OT Visit Diagnosis: Unsteadiness on feet (R26.81);Other abnormalities of gait and mobility (R26.89);Muscle weakness (generalized) (M62.81)                Time: 8644-8587 OT Time Calculation (min): 17 min Charges:  OT General Charges $OT Visit: 1 Visit OT Evaluation $OT Eval Moderate Complexity: 1 Mod  Maryna Yeagle K, OTD, OTR/L SecureChat Preferred Acute Rehab [phone]   Shakala Marlatt K Koonce 06/28/2024, 2:25 PM

## 2024-06-28 NOTE — Progress Notes (Signed)
 PT Cancellation Note  Patient Details Name: Hector Edwards MRN: [id number] DOB: 10/27/1945   Cancelled Treatment:    Reason Eval/Treat Not Completed: Active bedrest order  [name], PT DPT Acute Rehabilitation Services Secure Chat Preferred  Office [phone]  [name] [name] Kingdom 06/28/2024, 8:22 AM

## 2024-06-28 NOTE — Progress Notes (Signed)
 OT Cancellation Note  Patient Details Name: VYOM BRASS MRN: [id number] DOB: 07-03-1946   Cancelled Treatment:    Reason Eval/Treat Not Completed: Active bedrest order  Chavez Rosol K, OTD, OTR/L SecureChat Preferred Acute Rehab [phone]   Laneta MARLA Pereyra 06/28/2024, 6:52 AM

## 2024-06-28 NOTE — Progress Notes (Addendum)
 STROKE TEAM PROGRESS NOTE   INTERIM HISTORY/SUBJECTIVE MRI this afternoon Echo in progress  States sensation is improved to 90% on the left. Denies any weakness   OBJECTIVE  CBC    Component Value Date/Time   WBC 9.9 06/27/2024 1717   RBC 3.96 (L) 06/27/2024 1717   HGB 13.6 06/27/2024 1723   HGB 15.1 07/27/2022 0904   HCT 40.0 06/27/2024 1723   HCT 43.5 07/27/2022 0904   PLT 256 06/27/2024 1717   PLT 165 07/27/2022 0904   MCV 103.3 (H) 06/27/2024 1717   MCV 95 07/27/2022 0904   MCH 34.3 (H) 06/27/2024 1717   MCHC 33.3 06/27/2024 1717   RDW 15.6 (H) 06/27/2024 1717   RDW 13.2 07/27/2022 0904   LYMPHSABS 2.2 06/27/2024 1717   LYMPHSABS 1.9 06/16/2019 1035   MONOABS 1.4 (H) 06/27/2024 1717   EOSABS 0.0 06/27/2024 1717   EOSABS 0.1 06/16/2019 1035   BASOSABS 0.0 06/27/2024 1717   BASOSABS 0.0 06/16/2019 1035    BMET    Component Value Date/Time   NA 138 06/27/2024 1723   NA 135 05/12/2024 1026   K 3.6 06/27/2024 1723   CL 103 06/27/2024 1723   CO2 21 (L) 06/27/2024 1717   GLUCOSE 108 (H) 06/27/2024 1723   BUN 10 06/27/2024 1723   BUN 21 05/12/2024 1026   CREATININE 0.90 06/27/2024 1723   CREATININE 0.87 05/05/2014 0948   CALCIUM  9.6 06/27/2024 1717   EGFR 80 05/12/2024 1026   GFRNONAA >60 06/27/2024 1717   GFRNONAA >89 05/05/2014 0948    IMAGING past 24 hours ECHOCARDIOGRAM COMPLETE Result Date: 06/28/2024    ECHOCARDIOGRAM REPORT   Patient Name:   Hector Edwards Date of Exam: 06/28/2024 Medical Rec #:  [id number]        Height:       76.0 in Accession #:    [id number]       Weight:       298.9 lb Date of Birth:  [birth year]       BSA:          2.628 m Patient Age:    77 years         BP:           158/75 mmHg Patient Gender: M                HR:           77 bpm. Exam Location:  Inpatient Procedure: 2D Echo (Both Spectral and Color Flow Doppler were utilized during            procedure). Indications:     Stroke  History:         Patient has prior history of  Echocardiogram examinations. CAD;                  Risk Factors:Hypertension.  Sonographer:     Charmaine Gaskins Referring Phys:  [id number] ASHISH ARORA Diagnosing Phys: Vinie Maxcy MD IMPRESSIONS  1. Left ventricular ejection fraction, by estimation, is 65 to 70%. Left ventricular ejection fraction by 2D MOD biplane is 69.8 %. The left ventricle has normal function. The left ventricle has no regional wall motion abnormalities. There is severe left ventricular hypertrophy. Left ventricular diastolic parameters are consistent with Grade I diastolic dysfunction (impaired relaxation).  2. Right ventricular systolic function is normal. The right ventricular size is normal.  3. The mitral valve is degenerative. Trivial mitral valve regurgitation.  4. The aortic valve is calcified.  There is severe calcifcation of the aortic valve. Aortic valve regurgitation is trivial. Moderate aortic valve stenosis. Aortic valve area, by VTI measures 1.46 cm. Aortic valve mean gradient measures 25.0 mmHg. Aortic  valve Vmax measures 3.27 m/s. Peak gradient 42.8 mmHg, DI 0.37.  5. Aortic dilatation noted. There is mild dilatation of the aortic root, measuring 40 mm. Comparison(s): Changes from prior study are noted. 05/12/2024: LVEF 50-55%, severe LVH, mild to moderate AS- MG 21 mmHg, dilated aortic root to 43 mm. FINDINGS  Left Ventricle: Left ventricular ejection fraction, by estimation, is 65 to 70%. Left ventricular ejection fraction by 2D MOD biplane is 69.8 %. The left ventricle has normal function. The left ventricle has no regional wall motion abnormalities. The left ventricular internal cavity size was normal in size. There is severe left ventricular hypertrophy. Left ventricular diastolic parameters are consistent with Grade I diastolic dysfunction (impaired relaxation). Indeterminate filling pressures. Right Ventricle: The right ventricular size is normal. No increase in right ventricular wall thickness. Right ventricular systolic  function is normal. Left Atrium: Left atrial size was normal in size. Right Atrium: Right atrial size was normal in size. Pericardium: There is no evidence of pericardial effusion. Mitral Valve: The mitral valve is degenerative in appearance. Mild mitral annular calcification. Trivial mitral valve regurgitation. Tricuspid Valve: The tricuspid valve is not well visualized. Tricuspid valve regurgitation is not demonstrated. Aortic Valve: The aortic valve is calcified. There is severe calcifcation of the aortic valve. Aortic valve regurgitation is trivial. Moderate aortic stenosis is present. Aortic valve mean gradient measures 25.0 mmHg. Aortic valve peak gradient measures 42.8 mmHg. Aortic valve area, by VTI measures 1.46 cm. Pulmonic Valve: The pulmonic valve was grossly normal. Pulmonic valve regurgitation is trivial. Aorta: Aortic dilatation noted. There is mild dilatation of the aortic root, measuring 40 mm. IAS/Shunts: The interatrial septum was not well visualized.  LEFT VENTRICLE PLAX 2D                        Biplane EF (MOD) LVIDd:         3.35 cm         LV Biplane EF:   Left LVIDs:         2.30 cm                          ventricular LV PW:         1.30 cm                          ejection LV IVS:        1.34 cm                          fraction by LVOT diam:     2.24 cm                          2D MOD LV SV:         80                               biplane is LV SV Index:   30  69.8 %. LVOT Area:     3.94 cm                                Diastology                                LV e' medial:    5.33 cm/s LV Volumes (MOD)               LV E/e' medial:  12.0 LV vol d, MOD    67.3 ml       LV e' lateral:   8.16 cm/s A2C:                           LV E/e' lateral: 7.8 LV vol d, MOD    68.6 ml A4C: LV vol s, MOD    17.4 ml A2C: LV vol s, MOD    24.1 ml A4C: LV SV MOD A2C:   49.9 ml LV SV MOD A4C:   68.6 ml LV SV MOD BP:    48.8 ml RIGHT VENTRICLE RV Basal diam:  2.74 cm RV Mid  diam:    3.36 cm RV S prime:     11.30 cm/s LEFT ATRIUM             Index        RIGHT ATRIUM           Index LA diam:        2.56 cm 0.97 cm/m   RA Area:     15.60 cm LA Vol (A2C):   52.8 ml 20.09 ml/m  RA Volume:   36.50 ml  13.89 ml/m LA Vol (A4C):   47.5 ml 18.08 ml/m LA Biplane Vol: 50.4 ml 19.18 ml/m  AORTIC VALVE AV Area (Vmax):    1.40 cm AV Area (Vmean):   1.29 cm AV Area (VTI):     1.46 cm AV Vmax:           327.00 cm/s AV Vmean:          229.000 cm/s AV VTI:            0.545 m AV Peak Grad:      42.8 mmHg AV Mean Grad:      25.0 mmHg LVOT Vmax:         116.00 cm/s LVOT Vmean:        75.100 cm/s LVOT VTI:          0.202 m LVOT/AV VTI ratio: 0.37  AORTA Ao Root diam: 4.00 cm Ao Asc diam:  3.58 cm MITRAL VALVE MV Area (PHT): 3.53 cm    SHUNTS MV Decel Time: 215 msec    Systemic VTI:  0.20 m MV E velocity: 63.90 cm/s  Systemic Diam: 2.24 cm MV A velocity: 82.30 cm/s MV E/A ratio:  0.78 Vinie Maxcy MD Electronically signed by Vinie Maxcy MD Signature Date/Time: 06/28/2024/12:44:49 PM    Final (Updated)    CT ANGIO HEAD NECK W WO CM (CODE STROKE) Result Date: 06/27/2024 CLINICAL DATA:  Stroke, follow up. Left arm weakness, left arm and facial numbness, and facial droop. EXAM: CT ANGIOGRAPHY HEAD AND NECK WITH AND WITHOUT CONTRAST TECHNIQUE: Multidetector CT imaging of the head and neck was performed using the standard protocol during bolus administration of intravenous contrast. Multiplanar CT image  reconstructions and MIPs were obtained to evaluate the vascular anatomy. Carotid stenosis measurements (when applicable) are obtained utilizing NASCET criteria, using the distal internal carotid diameter as the denominator. RADIATION DOSE REDUCTION: This exam was performed according to the departmental dose-optimization program which includes automated exposure control, adjustment of the mA and/or kV according to patient size and/or use of iterative reconstruction technique. CONTRAST:  75mL OMNIPAQUE   IOHEXOL  350 MG/ML SOLN COMPARISON:  None Available. FINDINGS: CTA NECK FINDINGS Aortic arch: Incompletely imaged. Partially visualized mixed plaque in the distal arch and in the proximal left subclavian artery without evidence of a significant stenosis. The left vertebral artery arises directly from the arch. Right carotid system: Patent with scattered atherosclerosis throughout the common carotid artery and mixed plaque at the carotid bifurcation including prominent soft plaque resulting in 60% stenosis of the ICA origin. Left carotid system: Patent with mild scattered plaque throughout the common carotid artery and a moderate amount of mixed plaque at the carotid bifurcation and in the proximal ICA. No evidence of a significant stenosis. Vertebral arteries: The right vertebral artery is patent and dominant without evidence of a significant stenosis or dissection. The left vertebral artery is diffusely hypoplastic but patent with calcified plaque at its origin resulting in potentially severe stenosis with assessment limited by small vessel size. Skeleton: Dental caries.  Moderate cervical spondylosis. Other neck: Suspected nodule extending inferiorly off the right thyroid  lobe measuring approximately 1.7 cm with assessment significantly limited by streak artifact from venous contrast. No evidence cervical lymphadenopathy. Upper chest: Clear lung apices. Review of the MIP images confirms the above findings CTA HEAD FINDINGS Anterior circulation: The internal carotid arteries are patent from skull base to carotid termini with mild atherosclerotic calcification bilaterally not resulting in a significant stenosis. ACAs and MCAs are patent with mild branch vessel irregularity but no evidence of a proximal branch occlusion or significant A1 or M1 stenosis. No aneurysm is identified. Posterior circulation: The intracranial right vertebral artery is widely patent. The intracranial left vertebral artery is small, and there  is either a severe stenosis or short segment occlusion of the distal left V4 segment approximately 4 mm from the vertebrobasilar junction. Patent PICA and SCA origins are visualized bilaterally. There is likely a severe stenosis of the proximal left AICA. The basilar artery is patent and irregular with a mild stenosis proximally. There are large posterior communicating arteries and hypoplastic P1 segments bilaterally. The PCAs are patent with mild diffuse atherosclerotic irregularity bilaterally as well as a moderate stenosis of the proximal left P2 segment. No aneurysm is identified. Venous sinuses: Not evaluated due to contrast timing. Anatomic variants: Fetal origin of the PCAs. Review of the MIP images confirms the above findings These results were communicated to Dr. Arora at 6:02 pm on 06/27/2024 by text page via the [facility] messaging system. IMPRESSION: 1. Severe stenosis or short segment occlusion of the distal V4 segment of the nondominant left vertebral artery. 2. Prominent soft plaque in the right carotid bulb with 60% stenosis of the ICA origin. 3. Moderate proximal left P2 stenosis. 4. Suspected 1.7 cm right thyroid  nodule. Recommend non-emergent thyroid  ultrasound. Reference: J Am Coll Radiol. 2015 Feb;12(2): 143-50 5.  Aortic Atherosclerosis (ICD10-I70.0). Electronically Signed   By: Dasie Hamburg M.D.   On: 06/27/2024 18:17   CT HEAD CODE STROKE WO CONTRAST Result Date: 06/27/2024 CLINICAL DATA:  Code stroke. Neuro deficit, acute, stroke suspected. EXAM: CT HEAD WITHOUT CONTRAST TECHNIQUE: Contiguous axial images were obtained from the base of  the skull through the vertex without intravenous contrast. RADIATION DOSE REDUCTION: This exam was performed according to the departmental dose-optimization program which includes automated exposure control, adjustment of the mA and/or kV according to patient size and/or use of iterative reconstruction technique. COMPARISON:  Head CT 10/12/2023 FINDINGS: Brain:  There is no evidence of an acute infarct, intracranial hemorrhage, mass, midline shift, or extra-axial fluid collection. There is mild cerebral atrophy. Cerebral white matter hypodensities are similar to the prior CT and are nonspecific but compatible with mild chronic small vessel ischemic disease. Vascular: Calcified atherosclerosis at the skull base. No hyperdense vessel. Skull: No fracture or suspicious lesion. Sinuses/Orbits: Visualized paranasal sinuses and mastoid air cells are clear. Unremarkable orbits. Other: None. ASPECTS ([place] Stroke Program Early CT Score) - Ganglionic level infarction (caudate, lentiform nuclei, internal capsule, insula, M1-M3 cortex): 7 - Supraganglionic infarction (M4-M6 cortex): 3 Total score (0-10 with 10 being normal): 10 These results were communicated to Dr. Voncile at 5:31 pm on 06/27/2024 by text page via the [facility] messaging system. IMPRESSION: 1. No evidence of acute intracranial abnormality. ASPECTS of 10. 2. Mild chronic small vessel ischemic disease. Electronically Signed   By: Dasie Hamburg M.D.   On: 06/27/2024 17:31    Vitals:   06/28/24 1140 06/28/24 1200 06/28/24 1300 06/28/24 1400  BP:  (!) 141/78 138/78 (!) 152/88  Pulse:  84 88 83  Resp:  19 17 (!) 21  Temp: 98.2 F (36.8 C)     TempSrc: Axillary     SpO2:  94% 96% 96%  Weight:         PHYSICAL EXAM General:  Alert, well-nourished, well-developed patient in no acute distress Psych:  Mood and affect appropriate for situation CV: Regular rate and rhythm on monitor Respiratory:  Regular, unlabored respirations on room air GI: Abdomen soft and nontender   NEURO:  Mental Status: AA&Ox3, patient is able to give clear and coherent history Speech/Language: speech is without dysarthria or aphasia.  Naming, repetition, fluency, and comprehension intact.  Cranial Nerves:  II: PERRL. Visual fields full.  [name], IV, VI: EOMI. Eyelids elevate symmetrically.  V: Sensation is intact to light touch and  symmetrical to face.  VII: Face is symmetrical resting and smiling VIII: hearing intact to voice. IX, X: Palate elevates symmetrically. Phonation is normal.  KP:Dynloizm shrug 5/5. XII: tongue is midline without fasciculations. Motor: 5/5 strength to all muscle groups tested.  Tone: is normal and bulk is normal Sensation- diminished sensation in left arm, improving Coordination: FTN intact bilaterally, HKS: no ataxia in BLE.No drift.  Gait- deferred  Most Recent NIH 1    ASSESSMENT/PLAN  Mr. Hector Edwards is a 78 y.o. male with history of  hypertension, hyperlipidemia, CAD status post CABG, presenting for evaluation of sudden onset of left-sided weakness and sensory changes.  Last known well at 4:15 PM when he pulled into a gas station where he usually goes, and upon trying to get out from his car, was unable to use the left arm.  Mild facial droop, slurred speech, minimal left leg weakness.  NIH on Admission 4  Acute Ischemic Infarct:  suspect right hemispheric stroke Etiology:  vessel disease   Code Stroke CT head No acute abnormality. ASPECTS 10.    CTA head & neck Severe stenosis or short segment occlusion of the distal V4 segment of the nondominant left vertebral artery.  Prominent soft plaque in the right carotid bulb with 60% stenosis of the ICA origin. Moderate proximal left  P2 stenosis. MRI  Pending  2D Echo EF 65-70% LDL 37 HgbA1c 5.2 VTE prophylaxis - SCDs aspirin  81 mg daily prior to admission, now no antithrombotic until 24 hours post TNK.  ASA 81mg  and Plavix 75mg  for 3 weeks and then plavix alone if MRI has no hemorrhage.  Therapy recommendations:  Home Health PT and Outpatient PT/OT/ST Disposition:  Pending   Hypertension Home meds:  lotensin , cardizem  Stable Blood Pressure Goal: BP less than 180/105   CAD s/p CABG Hyperlipidemia Home meds:  Atorvastatin  80mg , resumed in hospital LDL 37, goal < 70 Continue statin at discharge   Hospital day #  1  Patient seen and examined by NP/APP with MD. MD to update note as needed.   Jorene Last, DNP, FNP-BC [facility] Pager: [phone]   ATTENDING NOTE: I reviewed above note and agree with the assessment and plan. Pt was seen and examined. Spouse at bedside. States that his sensation is 90% back to normal. Denies any weakness. MRI pending.   For detailed assessment and plan, please refer to above as I have made changes wherever appropriate.  I spent a total of 35 minutes dedicated to the care of this patient    Pastor Falling, MD  Stroke Neurology 06/28/2024 3:41 PM      To contact Stroke Continuity provider, please refer to [email or web address]. After hours, contact General Neurology

## 2024-06-28 NOTE — Evaluation (Signed)
 Physical Therapy Evaluation Patient Details Name: Hector Edwards MRN: [id number] DOB: [birth year] Today's Date: 06/28/2024  History of Present Illness  78 yo male presents to [facility] 9/20 code CVA for LUE>LLE and L facial weakness. CTH negative for acute findings, CTA shows prominent soft plaque at the R ICA bulb and short segment severe stenosis versus near occlusion of the V4 segment of the distal L vertebral artery, await MRI. TNK given 9/20 1700. PMH significant for CAD s/p CABG x3 2023, depression, GERD, HTN, HLD, low back pain, OSA.  Clinical Impression   Pt presents with mild LUE>LLE weakness, impaired LUE sensation, increased time and effort to mobilize, and decreased activity tolerance. Pt to benefit from acute PT to address deficits. Pt ambulated hallway distance without AD but does require mild steadying with challenge, anticipate pt is slightly weaker vs baseline. Pt would benefit from HHPT post-acutely to address deficits and ensure safe home set up, decrease risk of falls. Pt also may require increased supervision from family, pt's sister at bedside supportive. PT to progress mobility as tolerated, and will continue to follow acutely.          If plan is discharge home, recommend the following: A little help with walking and/or transfers;A little help with bathing/dressing/bathroom   Can travel by private vehicle        Equipment Recommendations None recommended by PT  Recommendations for Other Services       Functional Status Assessment Patient has had a recent decline in their functional status and demonstrates the ability to make significant improvements in function in a reasonable and predictable amount of time.     Precautions / Restrictions Precautions Precautions: Fall Restrictions Weight Bearing Restrictions Per Provider Order: No      Mobility  Bed Mobility Overal bed mobility: Needs Assistance Bed Mobility: Supine to Sit     Supine to sit: Mod assist, HOB  elevated, Used rails     General bed mobility comments: truncal elevation assist via HHA, HOB elevated. Increased time.    Transfers Overall transfer level: Needs assistance Equipment used: None Transfers: Sit to/from Stand Sit to Stand: Min assist           General transfer comment: assist for rise and steady    Ambulation/Gait Ambulation/Gait assistance: Contact guard assist, Min assist Gait Distance (Feet): 150 Feet Assistive device: None Gait Pattern/deviations: Step-through pattern, Decreased stride length, Trunk flexed Gait velocity: decr     General Gait Details: close guard for safety, x1 period of mild truncal steadying  Stairs            Wheelchair Mobility     Tilt Bed    Modified Rankin (Stroke Patients Only) Modified Rankin (Stroke Patients Only) Pre-Morbid Rankin Score: No significant disability Modified Rankin: Moderate disability     Balance Overall balance assessment: Needs assistance Sitting-balance support: No upper extremity supported, Feet supported Sitting balance-Leahy Scale: Fair     Standing balance support: No upper extremity supported, During functional activity Standing balance-Leahy Scale: Fair Standing balance comment: tolerates horizontal head turns (mild steadying needed), vertical head turns, change in gait speed, and directional changes well with increased time                             Pertinent Vitals/Pain Pain Assessment Pain Assessment: No/denies pain    Home Living Family/patient expects to be discharged to:: Private residence Living Arrangements: Alone Available Help at Discharge: Family;Friend(s);Available PRN/intermittently  Type of Home: House Home Access: Level entry       Home Layout: Two level;Able to live on main level with bedroom/bathroom Home Equipment: Other (comment) Additional Comments: adjustable bed    Prior Function Prior Level of Function : Independent/Modified  Independent;Driving             Mobility Comments: endorses no recent falls, 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 that cleans for him but could do it himself he states       Extremity/Trunk Assessment   Upper Extremity Assessment Upper Extremity Assessment: Defer to OT evaluation;Right hand dominant    Lower Extremity Assessment Lower Extremity Assessment: LLE deficits/detail LLE Deficits / Details: 4/5 hip flexion, 4/5 knee flex/ext, 3+/5 hip abd/add    Cervical / Trunk Assessment Cervical / Trunk Assessment: Other exceptions Cervical / Trunk Exceptions: abd obesity  Communication   Communication Communication: No apparent difficulties    Cognition Arousal: Alert Behavior During Therapy: Flat affect   PT - Cognitive impairments: No apparent impairments                         Following commands: Intact       Cueing Cueing Techniques: Verbal cues, Gestural cues     General Comments General comments (skin integrity, edema, etc.): SBP 166 s/p mobility    Exercises     Assessment/Plan    PT Assessment Patient needs continued PT services  PT Problem List Decreased strength;Decreased mobility;Decreased activity tolerance;Decreased balance;Decreased knowledge of use of DME;Cardiopulmonary status limiting activity;Impaired sensation;Decreased coordination;Decreased safety awareness       PT Treatment Interventions DME instruction;Therapeutic activities;Gait training;Therapeutic exercise;Patient/family education;Balance training;Functional mobility training;Neuromuscular re-education    PT Goals (Current goals can be found in the Care Plan section)  Acute Rehab PT Goals PT Goal Formulation: With patient Time For Goal Achievement: 07/12/24 Potential to Achieve Goals: Good    Frequency Min 2X/week     Co-evaluation               AM-PAC PT 6 Clicks Mobility  Outcome Measure Help needed turning from your back to your side while in a flat bed without using  bedrails?: A Little Help needed moving from lying on your back to sitting on the side of a flat bed without using bedrails?: A Little Help needed moving to and from a bed to a chair (including a wheelchair)?: A Little Help needed standing up from a chair using your arms (e.g., wheelchair or bedside chair)?: A Little Help needed to walk in hospital room?: A Little Help needed climbing 3-5 steps with a railing? : A Lot 6 Click Score: 17    End of Session Equipment Utilized During Treatment: Gait belt Activity Tolerance: Patient tolerated treatment well Patient left: in chair;with call bell/phone within reach;with chair alarm set;with family/visitor present Nurse Communication: Mobility status PT Visit Diagnosis: Other abnormalities of gait and mobility (R26.89);Muscle weakness (generalized) (M62.81)    Time: 8958-8895 PT Time Calculation (min) (ACUTE ONLY): 23 min   Charges:   PT Evaluation $PT Eval Low Complexity: 1 Low PT Treatments $Gait Training: 8-22 mins PT General Charges $$ ACUTE PT VISIT: 1 Visit         Johana RAMAN, PT DPT Acute Rehabilitation Services Secure Chat Preferred  Office [phone]   Takela Varden E Johna 06/28/2024, 11:24 AM

## 2024-06-28 NOTE — Plan of Care (Signed)
 Patient worked with PT/OT today and was able to perform own bath in bathroom. Patient has gotten up to the chair and up to wheelchair to go to MRI. Patient has eaten all meals, used urinal and bathroom independently. Educated patient on post-stroke guide. Family has visited and been updated on patient.

## 2024-06-29 ENCOUNTER — Other Ambulatory Visit (HOSPITAL_COMMUNITY): Payer: Self-pay

## 2024-06-29 ENCOUNTER — Other Ambulatory Visit: Payer: Self-pay

## 2024-06-29 ENCOUNTER — Telehealth (HOSPITAL_COMMUNITY): Payer: Self-pay | Admitting: Pharmacy Technician

## 2024-06-29 DIAGNOSIS — I6340 Cerebral infarction due to embolism of unspecified cerebral artery: Secondary | ICD-10-CM | POA: Diagnosis not present

## 2024-06-29 MED ORDER — APIXABAN 5 MG PO TABS
5.0000 mg | ORAL_TABLET | Freq: Two times a day (BID) | ORAL | Status: DC
  Administered 2024-06-29: 5 mg via ORAL
  Filled 2024-06-29: qty 1

## 2024-06-29 MED ORDER — APIXABAN 5 MG PO TABS
5.0000 mg | ORAL_TABLET | Freq: Two times a day (BID) | ORAL | 1 refills | Status: DC
  Filled 2024-06-29 (×2): qty 60, 30d supply, fill #0
  Filled 2024-07-09 – 2024-08-17 (×2): qty 60, 30d supply, fill #1

## 2024-06-29 MED ORDER — APIXABAN 5 MG PO TABS
5.0000 mg | ORAL_TABLET | Freq: Two times a day (BID) | ORAL | 1 refills | Status: DC
  Filled 2024-06-29: qty 60, 30d supply, fill #0

## 2024-06-29 NOTE — TOC Transition Note (Signed)
 Transition of Care (TOC) - Discharge Note Rayfield Gobble RN,BSN Inpatient Care Management Unit 4NP (Non Trauma)- RN Case Manager See Treatment Team for direct Phone #   Patient Details  Name: Hector Edwards MRN: [id number] Date of Birth: Mar 01, 1946  Transition of Care [facility]) CM/SW Contact:  Gobble Rayfield Hurst, RN Phone Number: 06/29/2024, 11:37 AM   Clinical Narrative:    Per attending team, pt likely to discharge today,  Orders placed for [facility] PT/OT needs.   CM in to speak with pt at bedside- sister also present at bedside- and pt voiced it's ok to speak in her presence.   Discussed HH needs, pt voiced he has done outpt cardiac rehab in past. No HH hx noted in [place].  List provided for [facility] choice Per CMS guidelines from [employer] website with star ratings (copy placed in shadow chart)- they have selected [facility] for [facility] needs.   Address, phone # and PCP all confirmed.  Pt voiced that his PCP is leaving the practice and he will [facility] a new [facility] [place] [facility]    Sister will transport home,   No DME needs noted, pt declined shower chair as he is redoing his bathroom and will have a shower seat built in.   Call made to [facility] liaison for [facility] referral- referral has been accepted for PT/OT needs.  They will contact pt within 48 hr of discharge to schedule.   Final next level of care: Home w Home Health Services Barriers to Discharge: No Barriers Identified   Patient Goals and CMS Choice Patient states their goals for this hospitalization and ongoing recovery are:: return home and get back to normal CMS Medicare.gov Compare Post Acute Care list provided to:: Patient Choice offered to / list presented to : Patient, Sibling      Discharge Placement                 Home w/ [facility].       Discharge Plan and Services Additional resources added to the After Visit Summary for     Discharge Planning Services: CM Consult Post Acute  Care Choice: Home Health          DME Arranged: N/A DME Agency: NA       HH Arranged: PT, OT HH Agency: [facility] Date HH Agency Contacted: 06/29/24 Time HH Agency Contacted: [id number] Representative spoke with at [facility] Agency: [facility]  Social Drivers of Health (SDOH) Interventions SDOH Screenings   Food Insecurity: No Food Insecurity (06/28/2024)  Housing: High Risk (06/28/2024)  Transportation Needs: No Transportation Needs (06/28/2024)  Utilities: Not At Risk (06/28/2024)  Alcohol Screen: Low Risk  (03/06/2022)  Depression (PHQ2-9): Low Risk  (09/03/2023)  Financial Resource Strain: Low Risk  (03/06/2022)  Physical Activity: Inactive (03/06/2022)  Social Connections: Moderately Isolated (06/28/2024)  Stress: No Stress Concern Present (03/06/2022)  Tobacco Use: Low Risk  (06/28/2024)     Readmission Risk Interventions    06/29/2024   11:37 AM 08/12/2022   10:16 AM  Readmission Risk Prevention Plan  Post Dischage Appt Complete Complete  Medication Screening Complete Complete  Transportation Screening Complete Complete

## 2024-06-29 NOTE — Telephone Encounter (Addendum)
 Patient Product/process development scientist completed.    The patient is insured through [employer]. Patient has Medicare and is not eligible for a copay card, but may be able to apply for patient assistance or Medicare RX Payment Plan (Patient Must reach out to their plan, if eligible for payment plan), if available.    Ran test claim for Eliquis  5 mg and the current 30 day co-pay is $152.71.  Ran test claim for Xarelto  20 mg and the current 30 day co-pay is $150.63  Ran test claim for dabigatran 150 mg and the current 30 day co-pay is $53.03.  This test claim was processed through [facility]- copay amounts may vary at other pharmacies due to pharmacy/plan contracts, or as the patient moves through the different stages of their insurance plan.     Hector Edwards, CPHT Pharmacy Technician [name] Certified Patient Advocate [facility] Patient Advocate Team Direct Number: [phone]  Fax: [phone]

## 2024-06-29 NOTE — Discharge Summary (Addendum)
 Stroke Discharge Summary  Patient ID: Hector Edwards   MRN: [id number]      DOB: Dec 12, 1945  Date of Admission: 06/27/2024 Date of Discharge: 06/29/2024  Attending Physician:  Stroke, Md, MD Consultant(s):    None  Patient's PCP:  Hector Leita Repine, FNP  DISCHARGE PRIMARY DIAGNOSIS:  Scattered right cerebral acute ischemic infarct: right frontal, parietal, occipital lobes Etiology: likely cardioembolic given prior history of A-fib  Secondary Diagnoses: Hypertension Hyperlipidemia History of PAF s/p ablation  Obesity BMI 36.285 kg/m2 OSA intolerant to CPAP CAD s/p 3 vessel CABG  Allergies as of 06/29/2024       Reactions   Hctz [hydrochlorothiazide] Shortness Of Breath   Enalapril Maleate Other (See Comments)   cramps   Tribenzor [olmesartan -amlodipine -hctz] Swelling   Fatigue, CP, edema   Verapamil  Other (See Comments)   Fatigue        Medication List     STOP taking these medications    Aspirin  Low Dose 81 MG tablet Generic drug: aspirin  EC       TAKE these medications    alfuzosin  10 MG 24 hr tablet Commonly known as: UROXATRAL  Take 1 tablet (10 mg total) by mouth daily with breakfast.   apixaban  5 MG Tabs tablet Commonly known as: ELIQUIS  Take 1 tablet (5 mg total) by mouth 2 (two) times daily.   atorvastatin  80 MG tablet Commonly known as: LIPITOR  Take 1 tablet (80 mg total) by mouth daily with breakfast.   benazepril  20 MG tablet Commonly known as: LOTENSIN  Take 1 tablet (20 mg total) by mouth daily with supper.   diltiazem  180 MG 24 hr capsule Commonly known as: CARDIZEM  CD Take 1 capsule (180 mg total) by mouth every morning.   pantoprazole  40 MG tablet Commonly known as: PROTONIX  Take 1 tablet (40 mg total) by mouth every evening. Please make appointment for refills   spironolactone  25 MG tablet Commonly known as: ALDACTONE  Take 1 tablet (25 mg total) by mouth every morning.       LABORATORY STUDIES CBC    Component  Value Date/Time   WBC 9.9 06/27/2024 1717   RBC 3.96 (L) 06/27/2024 1717   HGB 13.6 06/27/2024 1723   HGB 15.1 07/27/2022 0904   HCT 40.0 06/27/2024 1723   HCT 43.5 07/27/2022 0904   PLT 256 06/27/2024 1717   PLT 165 07/27/2022 0904   MCV 103.3 (H) 06/27/2024 1717   MCV 95 07/27/2022 0904   MCH 34.3 (H) 06/27/2024 1717   MCHC 33.3 06/27/2024 1717   RDW 15.6 (H) 06/27/2024 1717   RDW 13.2 07/27/2022 0904   LYMPHSABS 2.2 06/27/2024 1717   LYMPHSABS 1.9 06/16/2019 1035   MONOABS 1.4 (H) 06/27/2024 1717   EOSABS 0.0 06/27/2024 1717   EOSABS 0.1 06/16/2019 1035   BASOSABS 0.0 06/27/2024 1717   BASOSABS 0.0 06/16/2019 1035   CMP    Component Value Date/Time   NA 138 06/27/2024 1723   NA 135 05/12/2024 1026   K 3.6 06/27/2024 1723   CL 103 06/27/2024 1723   CO2 21 (L) 06/27/2024 1717   GLUCOSE 108 (H) 06/27/2024 1723   BUN 10 06/27/2024 1723   BUN 21 05/12/2024 1026   CREATININE 0.90 06/27/2024 1723   CREATININE 0.87 05/05/2014 0948   CALCIUM  9.6 06/27/2024 1717   PROT 7.4 06/27/2024 1717   PROT 7.5 06/16/2019 1035   ALBUMIN  3.7 06/27/2024 1717   ALBUMIN  4.4 06/16/2019 1035   AST 32 06/27/2024 1717  ALT 39 06/27/2024 1717   ALKPHOS 75 06/27/2024 1717   BILITOT 1.2 06/27/2024 1717   BILITOT 0.6 06/16/2019 1035   GFRNONAA >60 06/27/2024 1717   GFRNONAA >89 05/05/2014 0948   GFRAA 103 06/16/2019 1035   GFRAA >89 05/05/2014 0948   COAGS Lab Results  Component Value Date   INR 1.0 06/27/2024   INR 1.0 10/12/2023   INR 1.2 08/08/2022   Lipid Panel    Component Value Date/Time   CHOL 76 06/28/2024 0450   CHOL 170 06/16/2019 1035   TRIG 52 06/28/2024 0450   HDL 29 (L) 06/28/2024 0450   HDL 32 (L) 06/16/2019 1035   CHOLHDL 2.6 06/28/2024 0450   VLDL 10 06/28/2024 0450   LDLCALC 37 06/28/2024 0450   LDLCALC 121 (H) 06/16/2019 1035   HgbA1C  Lab Results  Component Value Date   HGBA1C 5.2 06/27/2024   Alcohol Level    Component Value Date/Time   [facility] <15  06/27/2024 1717   SIGNIFICANT DIAGNOSTIC STUDIES MR BRAIN WO CONTRAST Result Date: 06/28/2024 CLINICAL DATA:  Neuro deficit, acute, stroke suspected. EXAM: MRI HEAD WITHOUT CONTRAST TECHNIQUE: Multiplanar, multiecho pulse sequences of the brain and surrounding structures were obtained without intravenous contrast. COMPARISON:  Head CT and CTA 06/27/2024 FINDINGS: Brain: Scattered small acute cortical and subcortical infarcts are present in the right frontal, parietal, and occipital lobes with the most confluent area measuring 1.5 cm in the precentral gyrus. A single chronic microhemorrhage is noted in the right temporal lobe. Patchy T2 hyperintensities in the cerebral white matter bilaterally are nonspecific but compatible with mild-to-moderate chronic small vessel ischemic disease. There is mild generalized cerebral atrophy. No mass, midline shift, hydrocephalus, or extra-axial fluid collection is identified. Vascular: Abnormal appearance of the distal left vertebral artery, more fully evaluated on yesterday's CTA. Skull and upper cervical spine: Unremarkable bone marrow signal. Sinuses/Orbits: Unremarkable orbits. Small mucous retention cysts in the maxillary sinuses. Trace bilateral mastoid effusions. Other: None. IMPRESSION: 1. Scattered small acute right cerebral infarcts including in the precentral gyrus. 2. Mild-to-moderate chronic small vessel ischemic disease. Electronically Signed   By: Dasie Hamburg M.D.   On: 06/28/2024 17:04   ECHOCARDIOGRAM COMPLETE Result Date: 06/28/2024    ECHOCARDIOGRAM REPORT   Patient Name:   Hector Edwards Date of Exam: 06/28/2024 Medical Rec #:  [id number]        Height:       76.0 in Accession #:    [id number]       Weight:       298.9 lb Date of Birth:  15-Sep-1946       BSA:          2.628 m Patient Age:    77 years         BP:           158/75 mmHg Patient Gender: M                HR:           77 bpm. Exam Location:  Inpatient Procedure: 2D Echo (Both Spectral and  Color Flow Doppler were utilized during            procedure). Indications:     Stroke  History:         Patient has prior history of Echocardiogram examinations. CAD;                  Risk Factors:Hypertension.  Sonographer:     Charmaine Gaskins  Referring Phys:  [id number] ASHISH ARORA Diagnosing Phys: Vinie Maxcy MD IMPRESSIONS  1. Left ventricular ejection fraction, by estimation, is 65 to 70%. Left ventricular ejection fraction by 2D MOD biplane is 69.8 %. The left ventricle has normal function. The left ventricle has no regional wall motion abnormalities. There is severe left ventricular hypertrophy. Left ventricular diastolic parameters are consistent with Grade I diastolic dysfunction (impaired relaxation).  2. Right ventricular systolic function is normal. The right ventricular size is normal.  3. The mitral valve is degenerative. Trivial mitral valve regurgitation.  4. The aortic valve is calcified. There is severe calcifcation of the aortic valve. Aortic valve regurgitation is trivial. Moderate aortic valve stenosis. Aortic valve area, by VTI measures 1.46 cm. Aortic valve mean gradient measures 25.0 mmHg. Aortic  valve Vmax measures 3.27 m/s. Peak gradient 42.8 mmHg, DI 0.37.  5. Aortic dilatation noted. There is mild dilatation of the aortic root, measuring 40 mm. Comparison(s): Changes from prior study are noted. 05/12/2024: LVEF 50-55%, severe LVH, mild to moderate AS- MG 21 mmHg, dilated aortic root to 43 mm. FINDINGS  Left Ventricle: Left ventricular ejection fraction, by estimation, is 65 to 70%. Left ventricular ejection fraction by 2D MOD biplane is 69.8 %. The left ventricle has normal function. The left ventricle has no regional wall motion abnormalities. The left ventricular internal cavity size was normal in size. There is severe left ventricular hypertrophy. Left ventricular diastolic parameters are consistent with Grade I diastolic dysfunction (impaired relaxation). Indeterminate filling  pressures. Right Ventricle: The right ventricular size is normal. No increase in right ventricular wall thickness. Right ventricular systolic function is normal. Left Atrium: Left atrial size was normal in size. Right Atrium: Right atrial size was normal in size. Pericardium: There is no evidence of pericardial effusion. Mitral Valve: The mitral valve is degenerative in appearance. Mild mitral annular calcification. Trivial mitral valve regurgitation. Tricuspid Valve: The tricuspid valve is not well visualized. Tricuspid valve regurgitation is not demonstrated. Aortic Valve: The aortic valve is calcified. There is severe calcifcation of the aortic valve. Aortic valve regurgitation is trivial. Moderate aortic stenosis is present. Aortic valve mean gradient measures 25.0 mmHg. Aortic valve peak gradient measures 42.8 mmHg. Aortic valve area, by VTI measures 1.46 cm. Pulmonic Valve: The pulmonic valve was grossly normal. Pulmonic valve regurgitation is trivial. Aorta: Aortic dilatation noted. There is mild dilatation of the aortic root, measuring 40 mm. IAS/Shunts: The interatrial septum was not well visualized.  LEFT VENTRICLE PLAX 2D                        Biplane EF (MOD) LVIDd:         3.35 cm         LV Biplane EF:   Left LVIDs:         2.30 cm                          ventricular LV PW:         1.30 cm                          ejection LV IVS:        1.34 cm                          fraction by LVOT diam:     2.24 cm  2D MOD LV SV:         80                               biplane is LV SV Index:   30                               69.8 %. LVOT Area:     3.94 cm                                Diastology                                LV e' medial:    5.33 cm/s LV Volumes (MOD)               LV E/e' medial:  12.0 LV vol d, MOD    67.3 ml       LV e' lateral:   8.16 cm/s A2C:                           LV E/e' lateral: 7.8 LV vol d, MOD    68.6 ml A4C: LV vol s, MOD    17.4 ml A2C: LV vol s,  MOD    24.1 ml A4C: LV SV MOD A2C:   49.9 ml LV SV MOD A4C:   68.6 ml LV SV MOD BP:    48.8 ml RIGHT VENTRICLE RV Basal diam:  2.74 cm RV Mid diam:    3.36 cm RV S prime:     11.30 cm/s LEFT ATRIUM             Index        RIGHT ATRIUM           Index LA diam:        2.56 cm 0.97 cm/m   RA Area:     15.60 cm LA Vol (A2C):   52.8 ml 20.09 ml/m  RA Volume:   36.50 ml  13.89 ml/m LA Vol (A4C):   47.5 ml 18.08 ml/m LA Biplane Vol: 50.4 ml 19.18 ml/m  AORTIC VALVE AV Area (Vmax):    1.40 cm AV Area (Vmean):   1.29 cm AV Area (VTI):     1.46 cm AV Vmax:           327.00 cm/s AV Vmean:          229.000 cm/s AV VTI:            0.545 m AV Peak Grad:      42.8 mmHg AV Mean Grad:      25.0 mmHg LVOT Vmax:         116.00 cm/s LVOT Vmean:        75.100 cm/s LVOT VTI:          0.202 m LVOT/AV VTI ratio: 0.37  AORTA Ao Root diam: 4.00 cm Ao Asc diam:  3.58 cm MITRAL VALVE MV Area (PHT): 3.53 cm    SHUNTS MV Decel Time: 215 msec    Systemic VTI:  0.20 m MV E velocity: 63.90 cm/s  Systemic Diam: 2.24 cm MV A velocity: 82.30 cm/s MV E/A ratio:  0.78 Vinie Maxcy MD  Electronically signed by Vinie Maxcy MD Signature Date/Time: 06/28/2024/12:44:49 PM    Final (Updated)    CT ANGIO HEAD NECK W WO CM (CODE STROKE) Result Date: 06/27/2024 CLINICAL DATA:  Stroke, follow up. Left arm weakness, left arm and facial numbness, and facial droop. EXAM: CT ANGIOGRAPHY HEAD AND NECK WITH AND WITHOUT CONTRAST TECHNIQUE: Multidetector CT imaging of the head and neck was performed using the standard protocol during bolus administration of intravenous contrast. Multiplanar CT image reconstructions and MIPs were obtained to evaluate the vascular anatomy. Carotid stenosis measurements (when applicable) are obtained utilizing NASCET criteria, using the distal internal carotid diameter as the denominator. RADIATION DOSE REDUCTION: This exam was performed according to the departmental dose-optimization program which includes automated exposure  control, adjustment of the mA and/or kV according to patient size and/or use of iterative reconstruction technique. CONTRAST:  75mL OMNIPAQUE  IOHEXOL  350 MG/ML SOLN COMPARISON:  None Available. FINDINGS: CTA NECK FINDINGS Aortic arch: Incompletely imaged. Partially visualized mixed plaque in the distal arch and in the proximal left subclavian artery without evidence of a significant stenosis. The left vertebral artery arises directly from the arch. Right carotid system: Patent with scattered atherosclerosis throughout the common carotid artery and mixed plaque at the carotid bifurcation including prominent soft plaque resulting in 60% stenosis of the ICA origin. Left carotid system: Patent with mild scattered plaque throughout the common carotid artery and a moderate amount of mixed plaque at the carotid bifurcation and in the proximal ICA. No evidence of a significant stenosis. Vertebral arteries: The right vertebral artery is patent and dominant without evidence of a significant stenosis or dissection. The left vertebral artery is diffusely hypoplastic but patent with calcified plaque at its origin resulting in potentially severe stenosis with assessment limited by small vessel size. Skeleton: Dental caries.  Moderate cervical spondylosis. Other neck: Suspected nodule extending inferiorly off the right thyroid  lobe measuring approximately 1.7 cm with assessment significantly limited by streak artifact from venous contrast. No evidence cervical lymphadenopathy. Upper chest: Clear lung apices. Review of the MIP images confirms the above findings CTA HEAD FINDINGS Anterior circulation: The internal carotid arteries are patent from skull base to carotid termini with mild atherosclerotic calcification bilaterally not resulting in a significant stenosis. ACAs and MCAs are patent with mild branch vessel irregularity but no evidence of a proximal branch occlusion or significant A1 or M1 stenosis. No aneurysm is  identified. Posterior circulation: The intracranial right vertebral artery is widely patent. The intracranial left vertebral artery is small, and there is either a severe stenosis or short segment occlusion of the distal left V4 segment approximately 4 mm from the vertebrobasilar junction. Patent PICA and SCA origins are visualized bilaterally. There is likely a severe stenosis of the proximal left AICA. The basilar artery is patent and irregular with a mild stenosis proximally. There are large posterior communicating arteries and hypoplastic P1 segments bilaterally. The PCAs are patent with mild diffuse atherosclerotic irregularity bilaterally as well as a moderate stenosis of the proximal left P2 segment. No aneurysm is identified. Venous sinuses: Not evaluated due to contrast timing. Anatomic variants: Fetal origin of the PCAs. Review of the MIP images confirms the above findings These results were communicated to Dr. Arora at 6:02 pm on 06/27/2024 by text page via the [facility] messaging system. IMPRESSION: 1. Severe stenosis or short segment occlusion of the distal V4 segment of the nondominant left vertebral artery. 2. Prominent soft plaque in the right carotid bulb with 60% stenosis of the ICA  origin. 3. Moderate proximal left P2 stenosis. 4. Suspected 1.7 cm right thyroid  nodule. Recommend non-emergent thyroid  ultrasound. Reference: J Am Coll Radiol. 2015 Feb;12(2): 143-50 5.  Aortic Atherosclerosis (ICD10-I70.0). Electronically Signed   By: Dasie Hamburg M.D.   On: 06/27/2024 18:17   CT HEAD CODE STROKE WO CONTRAST Result Date: 06/27/2024 CLINICAL DATA:  Code stroke. Neuro deficit, acute, stroke suspected. EXAM: CT HEAD WITHOUT CONTRAST TECHNIQUE: Contiguous axial images were obtained from the base of the skull through the vertex without intravenous contrast. RADIATION DOSE REDUCTION: This exam was performed according to the departmental dose-optimization program which includes automated exposure control,  adjustment of the mA and/or kV according to patient size and/or use of iterative reconstruction technique. COMPARISON:  Head CT 10/12/2023 FINDINGS: Brain: There is no evidence of an acute infarct, intracranial hemorrhage, mass, midline shift, or extra-axial fluid collection. There is mild cerebral atrophy. Cerebral white matter hypodensities are similar to the prior CT and are nonspecific but compatible with mild chronic small vessel ischemic disease. Vascular: Calcified atherosclerosis at the skull base. No hyperdense vessel. Skull: No fracture or suspicious lesion. Sinuses/Orbits: Visualized paranasal sinuses and mastoid air cells are clear. Unremarkable orbits. Other: None. ASPECTS ([place] Stroke Program Early CT Score) - Ganglionic level infarction (caudate, lentiform nuclei, internal capsule, insula, M1-M3 cortex): 7 - Supraganglionic infarction (M4-M6 cortex): 3 Total score (0-10 with 10 being normal): 10 These results were communicated to Dr. Voncile at 5:31 pm on 06/27/2024 by text page via the [facility] messaging system. IMPRESSION: 1. No evidence of acute intracranial abnormality. ASPECTS of 10. 2. Mild chronic small vessel ischemic disease. Electronically Signed   By: Dasie Hamburg M.D.   On: 06/27/2024 17:31   ECHOCARDIOGRAM COMPLETE Result Date: 06/01/2024    ECHOCARDIOGRAM REPORT   Patient Name:   Hector Edwards Date of Exam: 06/01/2024 Medical Rec #:  [id number]        Height:       76.0 in Accession #:    [id number]       Weight:       314.0 lb Date of Birth:  1946/02/19       BSA:          2.683 m Patient Age:    77 years         BP:           138/92 mmHg Patient Gender: M                HR:           87 bpm. Exam Location:  [address] Procedure: 2D Echo, Cardiac Doppler and Color Doppler (Both Spectral and Color            Flow Doppler were utilized during procedure). Indications:    I35.0 Aortic stenosis  History:        Patient has prior history of Echocardiogram examinations, most                  recent 07/05/2022. CAD, Prior CABG, Arrythmias:Atrial                 Fibrillation, Signs/Symptoms:Dyspnea on exertion; Risk                 Factors:Hypertension, Sleep Apnea, Dyslipidemia and Obesity.                 Previous echo revealed LVEF 65% mild As mean gradient 12 mmHg.  mild aortic dilatation.  Sonographer:    Nolon Berg BA, RDCS Referring Phys: [id number] CHRISTOPHER D MCALHANY IMPRESSIONS  1. Significant septal lateral wall dysynergy. Left ventricular ejection fraction, by estimation, is 50 to 55%. The left ventricle has low normal function. The left ventricle has no regional wall motion abnormalities. There is severe left ventricular hypertrophy. Left ventricular diastolic parameters are consistent with Grade I diastolic dysfunction (impaired relaxation).  2. Right ventricular systolic function is normal. The right ventricular size is normal.  3. Left atrial size was mildly dilated.  4. The mitral valve is abnormal. Trivial mitral valve regurgitation. No evidence of mitral stenosis.  5. The aortic valve is tricuspid. There is moderate calcification of the aortic valve. There is moderate thickening of the aortic valve. Aortic valve regurgitation is not visualized. Mild to moderate aortic valve stenosis.  6. Aortic dilatation noted. There is moderate dilatation of the aortic root, measuring 43 mm. There is mild dilatation of the ascending aorta, measuring 38 mm.  7. The inferior vena cava is normal in size with greater than 50% respiratory variability, suggesting right atrial pressure of 3 mmHg. FINDINGS  Left Ventricle: Significant septal lateral wall dysynergy. Left ventricular ejection fraction, by estimation, is 50 to 55%. The left ventricle has low normal function. The left ventricle has no regional wall motion abnormalities. Strain was performed and the global longitudinal strain is indeterminate. The left ventricular internal cavity size was normal in size. There is severe left  ventricular hypertrophy. Left ventricular diastolic parameters are consistent with Grade I diastolic dysfunction (impaired relaxation). Right Ventricle: The right ventricular size is normal. No increase in right ventricular wall thickness. Right ventricular systolic function is normal. Left Atrium: Left atrial size was mildly dilated. Right Atrium: Right atrial size was normal in size. Pericardium: There is no evidence of pericardial effusion. Mitral Valve: The mitral valve is abnormal. There is mild thickening of the mitral valve leaflet(s). There is mild calcification of the mitral valve leaflet(s). Mild mitral annular calcification. Trivial mitral valve regurgitation. No evidence of mitral valve stenosis. Tricuspid Valve: The tricuspid valve is normal in structure. Tricuspid valve regurgitation is not demonstrated. No evidence of tricuspid stenosis. Aortic Valve: The aortic valve is tricuspid. There is moderate calcification of the aortic valve. There is moderate thickening of the aortic valve. Aortic valve regurgitation is not visualized. Mild to moderate aortic stenosis is present. Aortic valve mean gradient measures 21.0 mmHg. Aortic valve peak gradient measures 38.2 mmHg. Aortic valve area, by VTI measures 1.62 cm. Pulmonic Valve: The pulmonic valve was normal in structure. Pulmonic valve regurgitation is not visualized. No evidence of pulmonic stenosis. Aorta: Aortic dilatation noted. There is moderate dilatation of the aortic root, measuring 43 mm. There is mild dilatation of the ascending aorta, measuring 38 mm. Venous: The inferior vena cava is normal in size with greater than 50% respiratory variability, suggesting right atrial pressure of 3 mmHg. IAS/Shunts: The interatrial septum was not well visualized. Additional Comments: 3D was performed not requiring image post processing on an independent workstation and was indeterminate.  LEFT VENTRICLE PLAX 2D LVIDd:         4.10 cm   Diastology LVIDs:          3.00 cm   LV e' medial:    5.66 cm/s LV PW:         1.40 cm   LV E/e' medial:  10.9 LV IVS:        1.60 cm   LV e'  lateral:   7.40 cm/s LVOT diam:     2.40 cm   LV E/e' lateral: 8.3 LV SV:         84 LV SV Index:   31 LVOT Area:     4.52 cm  RIGHT VENTRICLE            IVC RV Basal diam:  3.90 cm    IVC diam: 1.20 cm RV S prime:     8.92 cm/s TAPSE (M-mode): 2.4 cm LEFT ATRIUM             Index        RIGHT ATRIUM           Index LA diam:        3.90 cm 1.45 cm/m   RA Area:     18.00 cm LA Vol (A2C):   33.7 ml 12.56 ml/m  RA Volume:   48.90 ml  18.22 ml/m LA Vol (A4C):   23.7 ml 8.83 ml/m LA Biplane Vol: 29.8 ml 11.11 ml/m  AORTIC VALVE AV Area (Vmax):    1.58 cm AV Area (Vmean):   1.63 cm AV Area (VTI):     1.62 cm AV Vmax:           309.00 cm/s AV Vmean:          199.000 cm/s AV VTI:            0.517 m AV Peak Grad:      38.2 mmHg AV Mean Grad:      21.0 mmHg LVOT Vmax:         108.00 cm/s LVOT Vmean:        71.800 cm/s LVOT VTI:          0.185 m LVOT/AV VTI ratio: 0.36  AORTA Ao Root diam: 4.30 cm Ao Asc diam:  3.80 cm MITRAL VALVE MV Area (PHT): 3.53 cm    SHUNTS MV Decel Time: 215 msec    Systemic VTI:  0.18 m MV E velocity: 61.60 cm/s  Systemic Diam: 2.40 cm MV A velocity: 84.10 cm/s MV E/A ratio:  0.73 Maude Emmer MD Electronically signed by Maude Emmer MD Signature Date/Time: 06/01/2024/2:26:47 PM    Final     HISTORY OF PRESENT ILLNESS 78 y.o. patient with history of hypertension, hyperlipidemia, CAD s/p CABG, OSA, CABG with postop atrial fibrillation not on [facility] was admitted with sudden onset of left-sided weakness and sensory changes.  HOSPITAL COURSE Hector Edwards is a 78 y.o. male with history of  hypertension, hyperlipidemia, CAD status post 3 vessel CABG 07/2022 with postop AF with RVR, AF s/p cardioversion in November 2023 on Eliquis  and amiodarone  until 2024 with discontinuation following cardiac monitor presenting for evaluation of sudden onset of left-sided weakness and  sensory changes.  Last known well at 4:15 PM when he pulled into a gas station where he usually goes, and upon trying to get out from his car, was unable to use the left arm.  Mild facial droop, slurred speech, minimal left leg weakness.  NIH on Admission 4   Scattered right cerebral acute ischemic infarct: right frontal, parietal, occipital lobes Etiology: likely cardioembolic Code Stroke CT head No acute abnormality. ASPECTS 10.    CTA head & neck Severe stenosis or short segment occlusion of the distal V4 segment of the nondominant left vertebral artery.  Prominent soft plaque in the right carotid bulb with 60% stenosis of the ICA origin. Moderate proximal left P2 stenosis. MRI  scattered small acute right cerebral infarcts including the precentral gyrus.  Mild to moderate chronic small vessel ischemic disease. 2D Echo EF 65-70% LDL 37 HgbA1c 5.2 VTE prophylaxis: SCDs, up ad lib aspirin  81 mg daily prior to admission, now Eliquis  dosing per pharmacy Therapy recommendations:  Home Health PT and OT Disposition: Discharged to home   History of Atrial Fibrillation AF with RVR s/p three-vessel CABG in October 2023 Patient underwent cardioversion 08/08/2022 Followed in atrial fibrillation clinic on Eliquis  and amiodarone  Cardiac monitor in 2024 without evidence of atrial fibrillation for 6 months so amiodarone  and Eliquis  subsequently stopped  Hypertension Home meds:  lotensin , cardizem  Stable Blood Pressure Goal: Gradual normotension   CAD s/p CABG Hyperlipidemia Home meds:  Atorvastatin  80mg , resumed in hospital LDL 37, goal < 70 Continue statin at discharge  OSA  Intolerant to CPAP   Obesity BMI 36.285 kg/m2  DISCHARGE EXAM PHYSICAL EXAM General:  Alert, well-nourished, well-developed patient in no acute distress Psych:  Mood and affect appropriate for situation CV: Regular rate and rhythm on monitor Respiratory:  Regular, unlabored respirations on room air GI: Abdomen soft  and nontender  NEURO:  Mental Status: AA&Ox3  Speech/Language: speech is without dysarthria or aphasia.  Naming, repetition, fluency, and comprehension intact.  Cranial Nerves:  II: PERRL. Visual fields full.  [name], IV, VI: EOMI. Eyelids elevate symmetrically.  V: Sensation is intact to light touch and symmetrical to face.  VII: Face is symmetric VIII: Hearing intact to voice. IX, X: Phonation intact XI: Shoulder shrug 5/5. XII: Tongue protrudes midline Motor: Antigravity throughout without vertical drift.  Proximal strength 5/5 in upper and lower extremities.  Diminished fine finger movements on the left.  Orbits right over left.  Left hand grip strength weakness noted compared to the right. Tone: is normal and bulk is normal Sensation: Slightly diminished sensation in the left arm to light touch Coordination: FTN intact bilaterally, HKS: no ataxia in BLE.No drift.  Gait: Deferred  Discharge Diet       Diet   Diet heart healthy/carb modified Fluid consistency: Thin   liquids  DISCHARGE PLAN Disposition: Home with Home Health Eliquis  (apixaban ) daily for secondary stroke prevention  Ongoing stroke risk factor control by Primary Care Physician at time of discharge Follow-up PCP Hector Leita Repine, FNP in 2 weeks. Follow up with cardiologist.  Dr. Verlin patient endorses planned follow up visit.  Follow-up in [facility] with nurse practitioner in 8 weeks, office to schedule an appointment. Able to see NP in clinic.  35 minutes were spent preparing discharge.  Stevi Toberman, AGACNP-BC [facility] Pager: [phone]  I have personally obtained history,examined this patient, reviewed notes, independently viewed imaging studies, participated in medical decision making and plan of care.ROS completed by me personally and pertinent positives fully documented  I have made any additions or clarifications directly to the above note.  Agree with note above.    Eather Popp, MD Medical Director [facility] Pager: [phone] 06/29/2024 3:44 PM

## 2024-06-29 NOTE — Progress Notes (Signed)
 Patient received from ICU without pain or discomfort. SpO2 > 93% on 2 lpm. Left arm weakness noted.

## 2024-06-29 NOTE — Plan of Care (Signed)
  Problem: Education: Goal: Knowledge of disease or condition will improve Outcome: Progressing Goal: Knowledge of secondary prevention will improve (MUST DOCUMENT ALL) Outcome: Progressing Goal: Knowledge of patient specific risk factors will improve (DELETE if not current risk factor) Outcome: Progressing   Problem: Ischemic Stroke/TIA Tissue Perfusion: Goal: Complications of ischemic stroke/TIA will be minimized Outcome: Progressing   Problem: Coping: Goal: Will verbalize positive feelings about self Outcome: Progressing Goal: Will identify appropriate support needs Outcome: Progressing   Problem: Health Behavior/Discharge Planning: Goal: Ability to manage health-related needs will improve Outcome: Progressing Goal: Goals will be collaboratively established with patient/family Outcome: Progressing   Problem: Self-Care: Goal: Ability to participate in self-care as condition permits will improve Outcome: Progressing Goal: Verbalization of feelings and concerns over difficulty with self-care will improve Outcome: Progressing Goal: Ability to communicate needs accurately will improve Outcome: Progressing   Problem: Nutrition: Goal: Risk of aspiration will decrease Outcome: Progressing Goal: Dietary intake will improve Outcome: Progressing   Problem: Education: Goal: Knowledge of disease or condition will improve Outcome: Progressing Goal: Knowledge of secondary prevention will improve (MUST DOCUMENT ALL) Outcome: Progressing Goal: Knowledge of patient specific risk factors will improve (DELETE if not current risk factor) Outcome: Progressing   Problem: Ischemic Stroke/TIA Tissue Perfusion: Goal: Complications of ischemic stroke/TIA will be minimized Outcome: Progressing   Problem: Coping: Goal: Will verbalize positive feelings about self Outcome: Progressing Goal: Will identify appropriate support needs Outcome: Progressing   Problem: Health Behavior/Discharge  Planning: Goal: Ability to manage health-related needs will improve Outcome: Progressing Goal: Goals will be collaboratively established with patient/family Outcome: Progressing   Problem: Self-Care: Goal: Ability to participate in self-care as condition permits will improve Outcome: Progressing Goal: Verbalization of feelings and concerns over difficulty with self-care will improve Outcome: Progressing Goal: Ability to communicate needs accurately will improve Outcome: Progressing   Problem: Nutrition: Goal: Risk of aspiration will decrease Outcome: Progressing Goal: Dietary intake will improve Outcome: Progressing   Problem: Education: Goal: Knowledge of General Education information will improve Description: Including pain rating scale, medication(s)/side effects and non-pharmacologic comfort measures Outcome: Progressing   Problem: Health Behavior/Discharge Planning: Goal: Ability to manage health-related needs will improve Outcome: Progressing   Problem: Clinical Measurements: Goal: Ability to maintain clinical measurements within normal limits will improve Outcome: Progressing Goal: Will remain free from infection Outcome: Progressing Goal: Diagnostic test results will improve Outcome: Progressing Goal: Respiratory complications will improve Outcome: Progressing Goal: Cardiovascular complication will be avoided Outcome: Progressing   Problem: Activity: Goal: Risk for activity intolerance will decrease Outcome: Progressing   Problem: Nutrition: Goal: Adequate nutrition will be maintained Outcome: Progressing   Problem: Coping: Goal: Level of anxiety will decrease Outcome: Progressing   Problem: Elimination: Goal: Will not experience complications related to bowel motility Outcome: Progressing Goal: Will not experience complications related to urinary retention Outcome: Progressing   Problem: Pain Managment: Goal: General experience of comfort will  improve and/or be controlled Outcome: Progressing   Problem: Safety: Goal: Ability to remain free from injury will improve Outcome: Progressing   Problem: Skin Integrity: Goal: Risk for impaired skin integrity will decrease Outcome: Progressing

## 2024-06-29 NOTE — Progress Notes (Signed)
 PHARMACY - ANTICOAGULATION CONSULT NOTE  Pharmacy Consult for Apixaban  (Eliquis )  Indication: atrial fibrillation  Allergies  Allergen Reactions   Hctz [Hydrochlorothiazide] Shortness Of Breath   Enalapril Maleate Other (See Comments)    cramps   Tribenzor [Olmesartan -Amlodipine -Hctz] Swelling    Fatigue, CP, edema   Verapamil  Other (See Comments)    Fatigue    Patient Measurements: Weight: 135.2 kg (298 lb 1 oz)  Vital Signs: Temp: 97.9 F (36.6 C) (09/22 0853) Temp Source: Oral (09/22 0853) BP: 165/79 (09/22 1100) Pulse Rate: 89 (09/22 1100)  Labs: Recent Labs    06/27/24 1717 06/27/24 1723  HGB 13.6 13.6  HCT 40.9 40.0  PLT 256  --   APTT 35  --   LABPROT 14.0  --   INR 1.0  --   CREATININE 0.87 0.90    Estimated Creatinine Clearance: 106.5 mL/min (by C-G formula based on SCr of 0.9 mg/dL).   Medical History: Past Medical History:  Diagnosis Date   Depression    GERD (gastroesophageal reflux disease)    Hx of adenomatous colonic polyps 09/04/2015   Hyperlipidemia    Hypertension    LBP (low back pain)    OSA (obstructive sleep apnea) 08/01/2018   Sleep apnea    wears cpap     Medications:  Medications Prior to Admission  Medication Sig Dispense Refill Last Dose/Taking   aspirin  EC (ASPIRIN  LOW DOSE) 81 MG tablet Take 1 tablet (81 mg total) by mouth daily with breakfast. Swallow whole. 30 tablet 1 06/27/2024   atorvastatin  (LIPITOR ) 80 MG tablet Take 1 tablet (80 mg total) by mouth daily with breakfast. 90 tablet 3 06/27/2024   benazepril  (LOTENSIN ) 20 MG tablet Take 1 tablet (20 mg total) by mouth daily with supper. 90 tablet 3 06/27/2024   diltiazem  (CARDIZEM  CD) 180 MG 24 hr capsule Take 1 capsule (180 mg total) by mouth every morning. 90 capsule 3 06/27/2024   pantoprazole  (PROTONIX ) 40 MG tablet Take 1 tablet (40 mg total) by mouth every evening. Please make appointment for refills 90 tablet 3 06/27/2024   spironolactone  (ALDACTONE ) 25 MG tablet Take  1 tablet (25 mg total) by mouth every morning. 90 tablet 3 06/27/2024   alfuzosin  (UROXATRAL ) 10 MG 24 hr tablet Take 1 tablet (10 mg total) by mouth daily with breakfast. (Patient not taking: Reported on 06/28/2024) 30 tablet 0 Not Taking   Scheduled:   apixaban   5 mg Oral BID   Chlorhexidine  Gluconate Cloth  6 each Topical Daily   pantoprazole  (PROTONIX ) IV  40 mg Intravenous QHS   Infusions:   Assessment: Patient admitted 9/20 with acute ischemic stroke. Patient has a history of atrial fibrillation on Eliquis  in 2024 for 6 months but subsequently discontinued after cardiac monitor.   Plan:  Eliquis  5 mg BID to start today  Will do copay check and education prior to discharge   Rankin Sams 06/29/2024,12:40 PM

## 2024-06-30 ENCOUNTER — Telehealth: Payer: Self-pay

## 2024-06-30 ENCOUNTER — Other Ambulatory Visit (HOSPITAL_COMMUNITY): Payer: Self-pay

## 2024-06-30 NOTE — Patient Instructions (Signed)
 Visit Information  Thank you for taking time to visit with me today. Please don't hesitate to contact me if I can be of assistance to you before our next scheduled telephone appointment.  Our next appointment is by telephone on Tuesday September 30th at 1:30pm  Following is a copy of your care plan:   Goals Addressed             This Visit's Progress    VBCI Transitions of Care (TOC) Care Plan       Problems:  Recent Hospitalization for treatment of Atrial Fibrillation, CVA Hospital or ED Adm Risk of 79%  Goal:  Over the next 30 days, the patient will not experience hospital readmission  Interventions:   Stroke: Reviewed Importance of taking all medications as prescribed Reviewed Importance of attending all scheduled provider appointments Advised to report any changes in symptoms or exercise tolerance Assessed for signs and symptoms of stroke Reviewed referrals to home health Reviewed the importance of exercise Reviewed the importance of following a heart healthy diet  Patient Self Care Activities:  Attend all scheduled provider appointments Call pharmacy for medication refills 3-7 days in advance of running out of medications Call provider office for new concerns or questions  Notify RN Care Manager of [facility] call rescheduling needs Participate in Transition of Care Program/Attend [facility] scheduled calls Perform all self care activities independently  Take medications as prescribed    Plan:  Telephone follow up appointment with care management team member scheduled for:  Tuesday September 30th at 1:30pm        Patient verbalizes understanding of instructions and care plan provided today and agrees to view in MyChart. Active MyChart status and patient understanding of how to access instructions and care plan via MyChart confirmed with patient.     The patient has been provided with contact information for the care management team and has been advised to call with any  health related questions or concerns.   Please call the care guide team at [phone] if you need to cancel or reschedule your appointment.   Please call the Suicide and Crisis Lifeline: 988 call the [place]  National Suicide Prevention Lifeline: 701-421-4101 or TTY: 2242017445 TTY 304 677 1077) to talk to a trained counselor if you are experiencing a Mental Health or Behavioral Health Crisis or need someone to talk to.  Medford Balboa, BSN, RN [place]  VBCI - [employer] [facility] RN Care Manager [phone]

## 2024-06-30 NOTE — Transitions of Care (Post Inpatient/ED Visit) (Signed)
 06/30/2024  Name: Hector Edwards MRN: [id number] DOB: 17-Dec-1945  Today's TOC FU Call Status: Today's TOC FU Call Status:: Successful TOC FU Call Completed TOC FU Call Complete Date: 06/30/24 Patient's Name and Date of Birth confirmed.  Transition Care Management Follow-up Telephone Call Date of Discharge: 06/29/24 Discharge Facility: [facility]) Type of Discharge: Inpatient Admission Primary Inpatient Discharge Diagnosis:: CVA How have you been since you were released from the hospital?: Better Any questions or concerns?: No  Items Reviewed: Did you receive and understand the discharge instructions provided?: Yes Medications obtained,verified, and reconciled?: Yes (Medications Reviewed) Any new allergies since your discharge?: No Dietary orders reviewed?: Yes Type of Diet Ordered:: Low Sodium Heart Healthy Do you have support at home?: Yes People in Home [RPT]: alone Name of Support/Comfort Primary Source: [name] Hesselbach  Medications Reviewed Today: Medications Reviewed Today     Reviewed by Moises Reusing, RN (Case Manager) on 06/30/24 at 1349  Med List Status: <None>   Medication Order Taking? Sig Documenting Provider Last Dose Status Informant  alfuzosin  (UROXATRAL ) 10 MG 24 hr tablet [id number] No Take 1 tablet (10 mg total) by mouth daily with breakfast.  Patient not taking: Reported on 06/28/2024   [name], FNP Not Taking Active Self, Spouse/Significant Other, Pharmacy Records, Multiple Informants  apixaban  (ELIQUIS ) 5 MG TABS tablet [id number]  Take 1 tablet (5 mg total) by mouth 2 (two) times daily. Toberman, Stevi W, NP  Active   atorvastatin  (LIPITOR ) 80 MG tablet [id number] No Take 1 tablet (80 mg total) by mouth daily with breakfast. Verlin Lonni BIRCH, MD 06/27/2024 Active Self, Spouse/Significant Other, Pharmacy Records, Multiple Informants  benazepril  (LOTENSIN ) 20 MG tablet [id number] No Take 1 tablet (20 mg total) by mouth daily with  supper. Verlin Lonni BIRCH, MD 06/27/2024 Active Self, Spouse/Significant Other, Pharmacy Records, Multiple Informants  diltiazem  (CARDIZEM  CD) 180 MG 24 hr capsule [id number] No Take 1 capsule (180 mg total) by mouth every morning. Verlin Lonni BIRCH, MD 06/27/2024 Active Self, Spouse/Significant Other, Pharmacy Records, Multiple Informants  pantoprazole  (PROTONIX ) 40 MG tablet [id number] No Take 1 tablet (40 mg total) by mouth every evening. Please make appointment for refills [name], FNP 06/27/2024 Active Spouse/Significant Other, Self, Pharmacy Records, Multiple Informants  spironolactone  (ALDACTONE ) 25 MG tablet [id number] No Take 1 tablet (25 mg total) by mouth every morning. Parthenia Olivia HERO, PA-C 06/27/2024 Active Self, Spouse/Significant Other, Pharmacy Records, Multiple Informants  Med List Note (Soremekun, Olalekan O, CPhT 10/12/23 2039): [name] [phone].             Home Care and Equipment/Supplies: Were Home Health Services Ordered?: Yes Name of Home Health Agency:: [facility] Has Agency set up a time to come to your home?: Yes First Home Health Visit Date: 06/30/24 Any new equipment or medical supplies ordered?: No  Functional Questionnaire: Do you need assistance with bathing/showering or dressing?: No Do you need assistance with meal preparation?: No (Eats out a lot) Do you need assistance with eating?: No Do you have difficulty maintaining continence: No Do you need assistance with getting out of bed/getting out of a chair/moving?: No Do you have difficulty managing or taking your medications?: No (Compliance packing from the pharmacy)  Follow up appointments reviewed: PCP Follow-up appointment confirmed?: Yes Date of PCP follow-up appointment?: 07/02/24 [facility]up Provider[facility] Follow-up appointment confirmed?: Yes Date of Specialist follow-up appointment?: 08/24/24 Follow-Up Specialty Provider:: Dr. Garrett Do you  need transportation to your follow-up appointment?: No Do you  understand care options if your condition(s) worsen?: Yes-patient verbalized understanding  SDOH Interventions Today    Flowsheet Row Most Recent Value  SDOH Interventions   Food Insecurity Interventions Intervention Not Indicated  Housing Interventions Intervention Not Indicated  Transportation Interventions Intervention Not Indicated  Utilities Interventions Intervention Not Indicated    Goals Addressed             This Visit's Progress    VBCI Transitions of Care (TOC) Care Plan       Problems:  Recent Hospitalization for treatment of Atrial Fibrillation, CVA Hospital or ED Adm Risk of 79%  Goal:  Over the next 30 days, the patient will not experience hospital readmission  Interventions:   Stroke: Reviewed Importance of taking all medications as prescribed Reviewed Importance of attending all scheduled provider appointments Advised to report any changes in symptoms or exercise tolerance Assessed for signs and symptoms of stroke Reviewed referrals to home health Reviewed the importance of exercise Reviewed the importance of following a heart healthy diet  Patient Self Care Activities:  Attend all scheduled provider appointments Call pharmacy for medication refills 3-7 days in advance of running out of medications Call provider office for new concerns or questions  Notify RN Care Manager of [facility] call rescheduling needs Participate in Transition of Care Program/Attend [facility] scheduled calls Perform all self care activities independently  Take medications as prescribed    Plan:  Telephone follow up appointment with care management team member scheduled for:  Tuesday September 30th at 1:30pm         Medford Balboa, BSN, RN [place]  VBCI - [facility] RN Care Manager [phone]

## 2024-06-30 NOTE — Telephone Encounter (Signed)
 Copied from CRM #[id number]. Topic: Clinical - Home Health Verbal Orders >> Jun 30, 2024  9:35 AM Carlyon D wrote: Caller/Agency: [facility]  Callback Number: [phone] (confidential VM) Service Requested: Physical Therapy Frequency: 1 x week for 9 weeks  Any new concerns about the patient? No

## 2024-06-30 NOTE — Transitions of Care (Post Inpatient/ED Visit) (Signed)
   06/30/2024  Name: Hector Edwards MRN: [id number] DOB: [birth year]  Today's TOC FU Call Status: Today's TOC FU Call Status:: Unsuccessful Call (1st Attempt) Unsuccessful Call (1st Attempt) Date: 06/30/24  Attempted to reach the patient regarding the most recent Inpatient/ED visit.  Follow Up Plan: Additional outreach attempts will be made to reach the patient to complete the Transitions of Care (Post Inpatient/ED visit) call.   Medford Balboa, BSN, RN [place]  VBCI - [employer] [facility] RN Care Manager [phone]

## 2024-06-30 NOTE — Telephone Encounter (Signed)
 Okay to give verbal orders in your name? Pt is scheduled for transfer of care appt w/ you in January.

## 2024-07-01 ENCOUNTER — Other Ambulatory Visit: Payer: Self-pay | Admitting: Cardiology

## 2024-07-01 ENCOUNTER — Other Ambulatory Visit: Payer: Self-pay

## 2024-07-01 ENCOUNTER — Other Ambulatory Visit (HOSPITAL_COMMUNITY): Payer: Self-pay

## 2024-07-01 DIAGNOSIS — I6349 Cerebral infarction due to embolism of other cerebral artery: Secondary | ICD-10-CM

## 2024-07-01 DIAGNOSIS — I639 Cerebral infarction, unspecified: Secondary | ICD-10-CM

## 2024-07-01 NOTE — Progress Notes (Signed)
 30d monitor for stroke

## 2024-07-02 ENCOUNTER — Ambulatory Visit: Payer: Self-pay | Admitting: Medical

## 2024-07-02 ENCOUNTER — Ambulatory Visit: Admitting: Medical

## 2024-07-02 ENCOUNTER — Other Ambulatory Visit (HOSPITAL_COMMUNITY): Payer: Self-pay

## 2024-07-02 ENCOUNTER — Other Ambulatory Visit: Payer: Self-pay

## 2024-07-02 VITALS — BP 112/74 | HR 95 | Temp 98.5°F | Resp 16 | Ht 76.0 in | Wt 295.6 lb

## 2024-07-02 DIAGNOSIS — E785 Hyperlipidemia, unspecified: Secondary | ICD-10-CM | POA: Diagnosis not present

## 2024-07-02 DIAGNOSIS — I480 Paroxysmal atrial fibrillation: Secondary | ICD-10-CM

## 2024-07-02 DIAGNOSIS — I509 Heart failure, unspecified: Secondary | ICD-10-CM | POA: Diagnosis not present

## 2024-07-02 DIAGNOSIS — Z8673 Personal history of transient ischemic attack (TIA), and cerebral infarction without residual deficits: Secondary | ICD-10-CM

## 2024-07-02 DIAGNOSIS — I10 Essential (primary) hypertension: Secondary | ICD-10-CM | POA: Diagnosis not present

## 2024-07-02 DIAGNOSIS — R351 Nocturia: Secondary | ICD-10-CM

## 2024-07-02 DIAGNOSIS — E041 Nontoxic single thyroid nodule: Secondary | ICD-10-CM

## 2024-07-02 DIAGNOSIS — Z23 Encounter for immunization: Secondary | ICD-10-CM | POA: Diagnosis not present

## 2024-07-02 DIAGNOSIS — N401 Enlarged prostate with lower urinary tract symptoms: Secondary | ICD-10-CM | POA: Diagnosis not present

## 2024-07-02 DIAGNOSIS — G4733 Obstructive sleep apnea (adult) (pediatric): Secondary | ICD-10-CM

## 2024-07-02 LAB — CBC WITH DIFFERENTIAL/PLATELET
Basophils Absolute: 0 K/uL (ref 0.0–0.1)
Basophils Relative: 0.2 % (ref 0.0–3.0)
Eosinophils Absolute: 0 K/uL (ref 0.0–0.7)
Eosinophils Relative: 0.3 % (ref 0.0–5.0)
HCT: 41.3 % (ref 39.0–52.0)
Hemoglobin: 14.3 g/dL (ref 13.0–17.0)
Lymphocytes Relative: 22.2 % (ref 12.0–46.0)
Lymphs Abs: 2.1 K/uL (ref 0.7–4.0)
MCHC: 34.6 g/dL (ref 30.0–36.0)
MCV: 99.8 fl (ref 78.0–100.0)
Monocytes Absolute: 1.4 K/uL — ABNORMAL HIGH (ref 0.1–1.0)
Monocytes Relative: 15.2 % — ABNORMAL HIGH (ref 3.0–12.0)
Neutro Abs: 5.9 K/uL (ref 1.4–7.7)
Neutrophils Relative %: 62.1 % (ref 43.0–77.0)
Platelets: 195 K/uL (ref 150.0–400.0)
RBC: 4.14 Mil/uL — ABNORMAL LOW (ref 4.22–5.81)
RDW: 16.7 % — ABNORMAL HIGH (ref 11.5–15.5)
WBC: 9.5 K/uL (ref 4.0–10.5)

## 2024-07-02 LAB — BRAIN NATRIURETIC PEPTIDE: Pro B Natriuretic peptide (BNP): 43 pg/mL (ref 0.0–100.0)

## 2024-07-02 MED ORDER — ALFUZOSIN HCL ER 10 MG PO TB24
10.0000 mg | ORAL_TABLET | Freq: Every day | ORAL | 11 refills | Status: AC
  Filled 2024-07-02 – 2024-07-09 (×2): qty 30, 30d supply, fill #0
  Filled 2024-08-17: qty 30, 30d supply, fill #1
  Filled 2024-09-11: qty 30, 30d supply, fill #2
  Filled 2024-10-14: qty 30, 30d supply, fill #3

## 2024-07-02 NOTE — Patient Instructions (Addendum)
 Ischemic stroke. likely cardioembolic Recent ischemic stroke likely cardioembolic due to atrial fibrillation. Residual left hand deficits, no significant left leg deficits. - Continue Eliquis  5 mg twice daily. - Follow up with [facility] in 8 weeks. - Order CBC, metabolic panel, and BNP. -PT and OT already scheduled.  PAF suspected suspected to have contributed to the stroke. No current cardiac monitoring. - Arrange for a 30-day cardiac monitor. Call back business that has sent you monitor information. - Follow up with cardiologist.  CHF Heart failure with no significant leg swelling. Oxygen  saturation 97%. - Continue Lotensin , diltiazem , and spironolactone . - Order BNP. - Monitor for leg swelling.  Hypertension. bp well controlled. Hypertension management ongoing. Blood pressure 112/74 mmHg, within acceptable range. - Continue current antihypertensive regimen.  Sleep apnea Obstructive sleep apnea with previous CPAP use discontinued. Discussed potential link to hypertension and stroke risk. - Consider retrying CPAP or exploring alternatives like Inspire. - Discuss weight loss for management.  Benign prostatic hyperplasia with lower urinary tract symptoms Benign prostatic hyperplasia with nocturia and increased urination frequency. Uroxatral  not taken due to packaging issues. - Refill Uroxatral  prescription.  Hyperlipidemia Hyperlipidemia management ongoing with atorvastatin . - Continue atorvastatin  80 mg daily.  Right thyroid  nodule Incidental 1.7 cm right thyroid  nodule on CT scan. - Order thyroid  ultrasound.  Follow up one month or sooner if needed

## 2024-07-02 NOTE — Progress Notes (Signed)
 Subjective:    Patient ID: Hector Edwards, male    DOB: 1946-04-03, 78 y.o.   MRN: [id number]  HPI     Date of Admission: 06/27/2024 Date of Discharge: 06/29/2024   Attending Physician:  Stroke, Md, MD Consultant(s):    None  Patient's PCP:  [name], FNP   DISCHARGE PRIMARY DIAGNOSIS:  Scattered right cerebral acute ischemic infarct: right frontal, parietal, occipital lobes Etiology: likely cardioembolic given prior history of A-fib   Secondary Diagnoses: Hypertension Hyperlipidemia History of PAF s/p ablation  Obesity BMI 36.285 kg/m2 OSA intolerant to CPAP CAD s/p 3 vessel CABG  Take these meds.  alfuzosin  10 MG 24 hr tablet Commonly known as: UROXATRAL  Take 1 tablet (10 mg total) by mouth daily with breakfast.    apixaban  5 MG Tabs tablet Commonly known as: ELIQUIS  Take 1 tablet (5 mg total) by mouth 2 (two) times daily.    atorvastatin  80 MG tablet Commonly known as: LIPITOR  Take 1 tablet (80 mg total) by mouth daily with breakfast.    benazepril  20 MG tablet Commonly known as: LOTENSIN  Take 1 tablet (20 mg total) by mouth daily with supper.    diltiazem  180 MG 24 hr capsule Commonly known as: CARDIZEM  CD Take 1 capsule (180 mg total) by mouth every morning.    pantoprazole  40 MG tablet Commonly known as: PROTONIX  Take 1 tablet (40 mg total) by mouth every evening. Please make appointment for refills    spironolactone  25 MG tablet Commonly known as: ALDACTONE  Take 1 tablet (25 mg total) by mouth every morning.    Neurology admission note.  Hector Edwards is a 78 y.o. male with hx of hypertension, hyperlipidemia, CAD status post CABG, presenting for evaluation of sudden onset of left-sided weakness.  Last known well at 4:15 PM when he pulled into a gas station where he usually goes, and upon trying to get out from his car, was unable to use the left arm.  Mild facial droop, slurred speech, not much of left leg weakness. Evaluated by  EMS-.  Met their criteria for stroke activation, code stroke activated and brought to [facility] Stat CT head negative.  Significant delay in getting IV access (tried and failed by multiple nurses and then the ED provider with ultrasound to get IV access ) prior to thrombolytic administration   Assessment    Hector Edwards is a 78 y.o. male past medical history as above presenting with sudden onset of left face and left arm weakness along with slurred speech.  On examination he also had mild left-sided sensory deficit.  CT head with no acute findings.  CT head reviewed personally prior to decision for IV thrombolysis.  Risk-benefit alternatives discussed with the patient. Significant amount of delay due to inability to get IV access but eventually IV access obtained with ultrasound guidance by the EDP and IV TNKase  given.  CT angiography head and neck negative for LVO-there was prominent soft plaque at the right ICA bulb and short segment severe stenosis versus near occlusion of the V4 segment of the distal left vertebral artery.   Will be admitted to ICU for post TNK care   Impression: Acute ischemic infarct-either a small vessel etiology in the brainstem or corona radiata or internal capsule or small embolic infarct involving more of the hand knob area   Hector Edwards is a 78 year old male with atrial fibrillation and recent stroke who presents for follow-up after hospitalization.  He is accompanied by his sister.  He was hospitalized on September 20th for a stroke, diagnosed as a scattered right cerebral artery ischemic infarct, likely cardioembolic due to his history of atrial fibrillation. He was discharged with instructions to stop low-dose aspirin  and start Eliquis  5 mg twice daily. He was already on atorvastatin  80 mg once daily, which he continues to take.  He experiences residual weakness in his left hand, with about 90% extension of digits capability and difficulty  using his thumb. He is right-handed and does not notice any issues with his left leg.  History of experiencing nocturia and frequent urination, which he attributes to not taking Uroxatral , a medication previously prescribed for BPH symptoms. He does not recall seeing a urologist.  He has a history of sleep apnea and previously used a CPAP machine, which he found uncomfortable and discontinued a couple of years ago. He reports snoring and shortness of breath when lying flat. He has been monitoring his weight daily since discharge, noting a gradual decrease. He experiences occasional swelling in his legs, which has improved since hospitalization.  He has a history of congestive heart failure and hypertension, managed with Lotensin , diltiazem , and spironolactone . Prior to recent stroke he  had experienced leg swelling severe enough to prevent wearing shoes in the past.  A recent CT angiogram of his neck revealed a 1.7 cm nodule on his right side of thyroid , which was an incidental finding during his hospital stay.   Review of Systems  Constitutional:  Negative for chills, fatigue and fever.  HENT:  Negative for congestion, ear discharge and facial swelling.   Respiratory:  Negative for cough, chest tightness, shortness of breath and wheezing.   Cardiovascular:  Negative for chest pain and palpitations.  Gastrointestinal:  Negative for abdominal pain, diarrhea, rectal pain and vomiting.  Musculoskeletal:  Negative for back pain and myalgias.  Skin:  Negative for rash.  Hematological:  Negative for adenopathy. Does not bruise/bleed easily.  Psychiatric/Behavioral:  Negative for behavioral problems and decreased concentration.     Past Medical History:  Diagnosis Date   Depression    GERD (gastroesophageal reflux disease)    Hx of adenomatous colonic polyps 09/04/2015   Hyperlipidemia    Hypertension    LBP (low back pain)    OSA (obstructive sleep apnea) 08/01/2018   Sleep apnea    wears  cpap      Social History   Socioeconomic History   Marital status: Divorced    Spouse name: Not on file   Number of children: 1   Years of education: Not on file   Highest education level: Not on file  Occupational History   Occupation: Retired   Occupation: Retired-mailman  Tobacco Use   Smoking status: Never   Smokeless tobacco: Never   Tobacco comments:    Never smoke 08/22/22  Vaping Use   Vaping status: Never Used  Substance and Sexual Activity   Alcohol use: Not Currently    Alcohol/week: 4.0 standard drinks of alcohol    Types: 4 Cans of beer per week    Comment: stop drinking 08/22/22   Drug use: No   Sexual activity: Not Currently  Other Topics Concern   Not on file  Social History Narrative   Regular exercise- yes, seldom; working physically all the time.   Social Drivers of Health   Financial Resource Strain: Low Risk  (03/06/2022)   Overall Financial Resource Strain (CARDIA)    Difficulty of Paying Living Expenses:  Not hard at all  Food Insecurity: No Food Insecurity (06/30/2024)   Hunger Vital Sign    Worried About Running Out of Food in the Last Year: Never true    Ran Out of Food in the Last Year: Never true  Transportation Needs: No Transportation Needs (06/30/2024)   PRAPARE - Administrator, Civil Service (Medical): No    Lack of Transportation (Non-Medical): No  Physical Activity: Inactive (03/06/2022)   Exercise Vital Sign    Days of Exercise per Week: 0 days    Minutes of Exercise per Session: 0 min  Stress: No Stress Concern Present (03/06/2022)   [employer] of Occupational Health - Occupational Stress Questionnaire    Feeling of Stress : Not at all  Social Connections: Moderately Isolated (06/28/2024)   Social Connection and Isolation Panel    Frequency of Communication with Friends and Family: More than three times a week    Frequency of Social Gatherings with Friends and Family: More than three times a week    Attends  Religious Services: Never    Database administrator or Organizations: Yes    Attends Engineer, structural: More than 4 times per year    Marital Status: Divorced  Intimate Partner Violence: Not At Risk (06/30/2024)   Humiliation, Afraid, Rape, and Kick questionnaire    Fear of Current or Ex-Partner: No    Emotionally Abused: No    Physically Abused: No    Sexually Abused: No    Past Surgical History:  Procedure Laterality Date   CARDIOVERSION N/A 08/08/2022   Procedure: CARDIOVERSION;  Surgeon: Raford Riggs, MD;  Location: [facility] ENDOSCOPY;  Service: Cardiovascular;  Laterality: N/A;   COLONOSCOPY  2004, 2021   hyperplastic polyps x 2   CORONARY ARTERY BYPASS GRAFT N/A 08/03/2022   Procedure: CORONARY ARTERY BYPASS GRAFTING (CABG) 3X, USING RIGHT GREATER SAPHENOUS VEIN AND LEFT INTERNAL MAMMARY ARTERY.;  Surgeon: Lucas Dorise POUR, MD;  Location: [facility] OR;  Service: Open Heart Surgery;  Laterality: N/A;   KNEE ARTHROSCOPY Right    LEFT HEART CATH AND CORONARY ANGIOGRAPHY N/A 08/01/2022   Procedure: LEFT HEART CATH AND CORONARY ANGIOGRAPHY;  Surgeon: Verlin Lonni BIRCH, MD;  Location: [facility] INVASIVE CV LAB;  Service: Cardiovascular;  Laterality: N/A;   PENILE PROSTHESIS IMPLANT N/A 01/25/2020   Procedure: PENILE PROTHESIS INFLATABLE COLOPLAST;  Surgeon: Carolee Sherwood BIRCH DOUGLAS, MD;  Location: [facility];  Service: Urology;  Laterality: N/A;   SHOULDER ARTHROSCOPY W/ ROTATOR CUFF REPAIR Left    TEE WITHOUT CARDIOVERSION N/A 08/03/2022   Procedure: TRANSESOPHAGEAL ECHOCARDIOGRAM (TEE);  Surgeon: Lucas Dorise POUR, MD;  Location: [facility] OR;  Service: Open Heart Surgery;  Laterality: N/A;   TEE WITHOUT CARDIOVERSION N/A 08/08/2022   Procedure: TRANSESOPHAGEAL ECHOCARDIOGRAM (TEE);  Surgeon: Raford Riggs, MD;  Location: [facility] ENDOSCOPY;  Service: Cardiovascular;  Laterality: N/A;   TONSILLECTOMY  as child   vocal cord growth removed  age 39   benign   WISDOM TOOTH EXTRACTION       Family History  Problem Relation Age of Onset   Stroke Mother 13   Depression Mother    Hypertension Father    Heart disease Father    Colon polyps Father    Pancreatic cancer Brother    Colon polyps Brother    Throat cancer Brother    Breast cancer Daughter    Hypertension Other    Colon cancer Neg Hx    Esophageal cancer Neg Hx  Stomach cancer Neg Hx    Rectal cancer Neg Hx     Allergies  Allergen Reactions   Hctz [Hydrochlorothiazide] Shortness Of Breath   Enalapril Maleate Other (See Comments)    cramps   Tribenzor [Olmesartan -Amlodipine -Hctz] Swelling    Fatigue, CP, edema   Verapamil  Other (See Comments)    Fatigue    Current Outpatient Medications on File Prior to Visit  Medication Sig Dispense Refill   apixaban  (ELIQUIS ) 5 MG TABS tablet Take 1 tablet (5 mg total) by mouth 2 (two) times daily. 60 tablet 1   atorvastatin  (LIPITOR ) 80 MG tablet Take 1 tablet (80 mg total) by mouth daily with breakfast. 90 tablet 3   benazepril  (LOTENSIN ) 20 MG tablet Take 1 tablet (20 mg total) by mouth daily with supper. 90 tablet 3   diltiazem  (CARDIZEM  CD) 180 MG 24 hr capsule Take 1 capsule (180 mg total) by mouth every morning. 90 capsule 3   pantoprazole  (PROTONIX ) 40 MG tablet Take 1 tablet (40 mg total) by mouth every evening. Please make appointment for refills 90 tablet 3   spironolactone  (ALDACTONE ) 25 MG tablet Take 1 tablet (25 mg total) by mouth every morning. 90 tablet 3   alfuzosin  (UROXATRAL ) 10 MG 24 hr tablet Take 1 tablet (10 mg total) by mouth daily with breakfast. (Patient not taking: Reported on 07/02/2024) 30 tablet 0   No current facility-administered medications on file prior to visit.    BP 112/74   Pulse 95   Temp 98.5 F (36.9 C) (Oral)   Resp 16   Ht 6' 4 (1.93 m)   Wt 295 lb 9.6 oz (134.1 kg)   SpO2 97%   BMI 35.98 kg/m          Objective:   Physical Exam   General Mental Status- Alert. General Appearance- Not in acute  distress.   Skin General: Color- Normal Color. Moisture- Normal Moisture.  Neck Carotid Arteries- Normal color. Moisture- Normal Moisture. No carotid bruits. No JVD.  Chest and Lung Exam Auscultation: Breath Sounds:-Normal.  Cardiovascular Auscultation:Rythm- Regular. Murmurs & Other Heart Sounds:Auscultation of the heart reveals- No Murmurs.  Abdomen Inspection:-Inspeection Normal. Palpation/Percussion:Note:No mass. Palpation and Percussion of the abdomen reveal- Non Tender, Non Distended + BS, no rebound or guarding.    Neurologic Cranial Nerve exam:- CN [name]-XII intact(No nystagmus), symmetric smile. Drift Test:- No drift.. Finger to Nose:- Normal/Intact Strength:- left hand grip strength 3/5 approximate. Hand incordination. Upper arm strength testing 5/5. Rt upper and hand strength normal  Lower ext- calfs symmetric and no pedal edema.     Assessment & Plan:   Patient Instructions  Ischemic stroke. likely cardioembolic Recent ischemic stroke likely cardioembolic due to atrial fibrillation. Residual left hand deficits, no significant left leg deficits. - Continue Eliquis  5 mg twice daily. - Follow up with [facility] Neurology in 8 weeks. - Order CBC, metabolic panel, and BNP.  PAF suspected suspected to have contributed to the stroke. No current cardiac monitoring. - Arrange for a 30-day cardiac monitor. Call back business that has sent you monitor information. - Follow up with cardiologist.  CHF Heart failure with no significant leg swelling. Oxygen  saturation 97%. - Continue Lotensin , diltiazem , and spironolactone . - Order BNP. - Monitor for leg swelling.  Hypertension. bp well controlled. Hypertension management ongoing. Blood pressure 112/74 mmHg, within acceptable range. - Continue current antihypertensive regimen.  Sleep apnea Obstructive sleep apnea with previous CPAP use discontinued. Discussed potential link to hypertension and stroke risk. - Consider  retrying CPAP or exploring alternatives like Inspire. - Discuss weight loss for management.  Benign prostatic hyperplasia with lower urinary tract symptoms Benign prostatic hyperplasia with nocturia and increased urination frequency. Uroxatral  not taken due to packaging issues. - Refill Uroxatral  prescription.  Hyperlipidemia Hyperlipidemia management ongoing with atorvastatin . - Continue atorvastatin  80 mg daily.  Right thyroid  nodule Incidental 1.7 cm right thyroid  nodule on CT scan. - Order thyroid  ultrasound.  Follow up one month or sooner if needed   I personally spent a total of 60 minutes in the care of the patient today including getting/reviewing separately obtained history, performing a medically appropriate exam/evaluation, counseling and educating, placing orders, referring and communicating with other health care professionals, and documenting clinical information in the EHR.

## 2024-07-02 NOTE — Addendum Note (Signed)
 Addended by: TRUDY CURVIN RAMAN on: 07/02/2024 05:08 PM   Modules accepted: Orders

## 2024-07-03 ENCOUNTER — Other Ambulatory Visit: Payer: Self-pay

## 2024-07-03 NOTE — Telephone Encounter (Signed)
 Orders received via fax- I have informed [facility] that Leita is no longer here, Edward Saguier taking over. They will refax w/ corrected information.

## 2024-07-04 ENCOUNTER — Ambulatory Visit (HOSPITAL_BASED_OUTPATIENT_CLINIC_OR_DEPARTMENT_OTHER)
Admission: RE | Admit: 2024-07-04 | Discharge: 2024-07-04 | Disposition: A | Discharge status: Home / Self Care | Source: Ambulatory Visit | Attending: Medical | Admitting: Medical

## 2024-07-04 DIAGNOSIS — E041 Nontoxic single thyroid nodule: Secondary | ICD-10-CM | POA: Diagnosis not present

## 2024-07-06 ENCOUNTER — Other Ambulatory Visit (INDEPENDENT_AMBULATORY_CARE_PROVIDER_SITE_OTHER)

## 2024-07-06 ENCOUNTER — Other Ambulatory Visit: Payer: Self-pay

## 2024-07-06 ENCOUNTER — Other Ambulatory Visit (HOSPITAL_COMMUNITY): Payer: Self-pay

## 2024-07-06 DIAGNOSIS — I6349 Cerebral infarction due to embolism of other cerebral artery: Secondary | ICD-10-CM | POA: Diagnosis not present

## 2024-07-06 DIAGNOSIS — E785 Hyperlipidemia, unspecified: Secondary | ICD-10-CM | POA: Diagnosis not present

## 2024-07-06 DIAGNOSIS — I10 Essential (primary) hypertension: Secondary | ICD-10-CM

## 2024-07-06 DIAGNOSIS — I639 Cerebral infarction, unspecified: Secondary | ICD-10-CM | POA: Diagnosis not present

## 2024-07-06 LAB — COMPREHENSIVE METABOLIC PANEL WITH GFR
ALT: 32 U/L (ref 0–53)
AST: 19 U/L (ref 0–37)
Albumin: 4.2 g/dL (ref 3.5–5.2)
Alkaline Phosphatase: 84 U/L (ref 39–117)
BUN: 11 mg/dL (ref 6–23)
CO2: 28 meq/L (ref 19–32)
Calcium: 9.6 mg/dL (ref 8.4–10.5)
Chloride: 101 meq/L (ref 96–112)
Creatinine, Ser: 0.84 mg/dL (ref 0.40–1.50)
GFR: 85.05 mL/min (ref >60.00)
Glucose, Bld: 123 mg/dL — ABNORMAL HIGH (ref 70–99)
Potassium: 3.8 meq/L (ref 3.5–5.1)
Sodium: 137 meq/L (ref 135–145)
Total Bilirubin: 0.9 mg/dL (ref 0.2–1.2)
Total Protein: 7.1 g/dL (ref 6.0–8.3)

## 2024-07-07 ENCOUNTER — Other Ambulatory Visit: Payer: Self-pay

## 2024-07-07 NOTE — Patient Instructions (Signed)
 Visit Information  Thank you for taking time to visit with me today. Please don't hesitate to contact me if I can be of assistance to you before our next scheduled telephone appointment.  Our next appointment is by telephone on October 7th at 2:00pm  Following is a copy of your care plan:   Goals Addressed             This Visit's Progress    VBCI Transitions of Care (TOC) Care Plan       Problems:  Recent Hospitalization for treatment of Atrial Fibrillation, CVA Hospital or ED Adm Risk of 79%  Goal:  Over the next 30 days, the patient will not experience hospital readmission  Interventions:   Stroke: Reviewed Importance of taking all medications as prescribed Reviewed Importance of attending all scheduled provider appointments Advised to report any changes in symptoms or exercise tolerance Assessed for signs and symptoms of stroke Reviewed referrals to home health Reviewed the importance of exercise Reviewed the importance of following a heart healthy diet 07/07/24 - Wear Cardiac Monitor for 30 Days for A-Fib 07/07/24 - HEP as directed by OT for left hand strength  Patient Self Care Activities:  Attend all scheduled provider appointments Call pharmacy for medication refills 3-7 days in advance of running out of medications Call provider office for new concerns or questions  Notify RN Care Manager of [facility] call rescheduling needs Participate in Transition of Care Program/Attend [facility] scheduled calls Perform all self care activities independently  Take medications as prescribed    Plan:  Telephone follow up appointment with care management team member scheduled for:  Tuesday October 7th at 2:00pm        Patient verbalizes understanding of instructions and care plan provided today and agrees to view in MyChart. Active MyChart status and patient understanding of how to access instructions and care plan via MyChart confirmed with patient.     The patient has been provided  with contact information for the care management team and has been advised to call with any health related questions or concerns.   Please call the care guide team at [phone] if you need to cancel or reschedule your appointment.   Please call the Suicide and Crisis Lifeline: 988 call the [place]  National Suicide Prevention Lifeline: 907-862-0779 or TTY: 323-178-8902 TTY 508-633-3547) to talk to a trained counselor if you are experiencing a Mental Health or Behavioral Health Crisis or need someone to talk to.  Medford Balboa, BSN, RN [place]  [facility]I - [employer] [facility] RN Care Manager [phone]

## 2024-07-07 NOTE — Transitions of Care (Post Inpatient/ED Visit) (Signed)
 Transition of Care week 2  Visit Note  07/07/2024  Name: Hector Edwards MRN: [id number]          DOB: 01-26-1946  Situation: Patient enrolled in [facility] 30-day program. Visit completed with Hector Edwards by telephone.   Background:   Past Medical History:  Diagnosis Date   Depression    GERD (gastroesophageal reflux disease)    Hx of adenomatous colonic polyps 09/04/2015   Hyperlipidemia    Hypertension    LBP (low back pain)    OSA (obstructive sleep apnea) 08/01/2018   Sleep apnea    wears cpap     Assessment: Patient Reported Symptoms: Cognitive Cognitive Status: Alert and oriented to person, place, and time, Normal speech and language skills      Neurological Neurological Review of Symptoms: Weakness Neurological Management Strategies: Medication therapy, Coping strategies, Routine screening Neurological Comment: States he can move his left arm but he cannot grab things with his left hand  HEENT HEENT Symptoms Reported: No symptoms reported HEENT Comment: Deaf in left ear    Cardiovascular Cardiovascular Symptoms Reported: No symptoms reported Does patient have uncontrolled Hypertension?: No Cardiovascular Management Strategies: Medical device, Medication therapy, Routine screening, Coping strategies Weight: 291 lb (132 kg) Cardiovascular Comment: The patient is wearing a 30 day cardiac monitor  Respiratory Respiratory Symptoms Reported: No symptoms reported    Endocrine Endocrine Symptoms Reported: No symptoms reported Is patient diabetic?: No    Gastrointestinal Gastrointestinal Symptoms Reported: No symptoms reported      Genitourinary Genitourinary Symptoms Reported: No symptoms reported    Integumentary Integumentary Symptoms Reported: No symptoms reported    Musculoskeletal Musculoskelatal Symptoms Reviewed: Weakness, Unsteady gait Musculoskeletal Management Strategies: Exercise, Medical device, Routine screening Musculoskeletal Comment: Walked down the  driveway and back today. HHPT was at the house today      Psychosocial Psychosocial Symptoms Reported: No symptoms reported         There were no vitals filed for this visit.  Medications Reviewed Today     Reviewed by Moises Reusing, RN (Case Manager) on 07/07/24 at 1352  Med List Status: <None>   Medication Order Taking? Sig Documenting Provider Last Dose Status Informant  alfuzosin  (UROXATRAL ) 10 MG 24 hr tablet [id number]  Take 1 tablet (10 mg total) by mouth daily with breakfast. Saguier, [place], PA-C  Active   apixaban  (ELIQUIS ) 5 MG TABS tablet [id number]  Take 1 tablet (5 mg total) by mouth 2 (two) times daily. Toberman, Stevi W, NP  Active   atorvastatin  (LIPITOR ) 80 MG tablet [id number]  Take 1 tablet (80 mg total) by mouth daily with breakfast. Verlin Lonni BIRCH, MD  Active Self, Spouse/Significant Other, Pharmacy Records, Multiple Informants  benazepril  (LOTENSIN ) 20 MG tablet [id number]  Take 1 tablet (20 mg total) by mouth daily with supper. Verlin Lonni BIRCH, MD  Active Self, Spouse/Significant Other, Pharmacy Records, Multiple Informants  diltiazem  (CARDIZEM  CD) 180 MG 24 hr capsule [id number]  Take 1 capsule (180 mg total) by mouth every morning. Verlin Lonni BIRCH, MD  Active Self, Spouse/Significant Other, Pharmacy Records, Multiple Informants  pantoprazole  (PROTONIX ) 40 MG tablet [id number]  Take 1 tablet (40 mg total) by mouth every evening. Please make appointment for refills [name], FNP  Active Spouse/Significant Other, Self, Pharmacy Records, Multiple Informants  spironolactone  (ALDACTONE ) 25 MG tablet [id number]  Take 1 tablet (25 mg total) by mouth every morning. Parthenia Olivia HERO, PA-C  Active Self, Spouse/Significant Other, Pharmacy Records, Multiple Informants  Med List Note [name],  [name], CPhT 10/12/23 2039): [name] [phone].             Recommendation:   Continue Current Plan of Care  Follow Up Plan:    Telephone follow-up in 1 week  Medford Balboa, BSN, RN   VBCI - [facility] RN Care Manager [phone]

## 2024-07-08 ENCOUNTER — Telehealth: Payer: Self-pay

## 2024-07-08 ENCOUNTER — Other Ambulatory Visit: Payer: Self-pay

## 2024-07-08 NOTE — Telephone Encounter (Signed)
 [facility] home [facility] forms faxed with confirmation recieved

## 2024-07-09 ENCOUNTER — Other Ambulatory Visit: Payer: Self-pay

## 2024-07-10 ENCOUNTER — Other Ambulatory Visit: Payer: Self-pay

## 2024-07-13 ENCOUNTER — Other Ambulatory Visit: Payer: Self-pay

## 2024-07-14 ENCOUNTER — Other Ambulatory Visit: Payer: Self-pay

## 2024-07-14 NOTE — Patient Instructions (Signed)
 Visit Information  Thank you for taking time to visit with me today. Please don't hesitate to contact me if I can be of assistance to you before our next scheduled telephone appointment.  Our next appointment is by telephone on Tuesday October 14th at 2pm  Following is a copy of your care plan:   Goals Addressed             This Visit's Progress    VBCI Transitions of Care (TOC) Care Plan       Problems: (reviewed 07/14/24) Recent Hospitalization for treatment of Atrial Fibrillation, CVA Hospital or ED Adm Risk of 79%  Goal:  (reviewed 07/14/24) Over the next 30 days, the patient will not experience hospital readmission  Interventions:  (reviewed 07/14/24)  Stroke: Reviewed Importance of taking all medications as prescribed Reviewed Importance of attending all scheduled provider appointments Advised to report any changes in symptoms or exercise tolerance Assessed for signs and symptoms of stroke Reviewed referrals to home health Reviewed the importance of exercise Reviewed the importance of following a heart healthy diet 07/07/24 - Wear Cardiac Monitor for 30 Days for A-Fib 07/07/24 - HEP as directed by OT for left hand strength  Patient Self Care Activities:  (reviewed 07/14/24) Attend all scheduled provider appointments Call pharmacy for medication refills 3-7 days in advance of running out of medications Call provider office for new concerns or questions  Notify RN Care Manager of [facility] call rescheduling needs Participate in Transition of Care Program/Attend [facility] scheduled calls Perform all self care activities independently  Take medications as prescribed    Plan:  Telephone follow up appointment with care management team member scheduled for:  Tuesday October 7th at 2:00pm        Patient verbalizes understanding of instructions and care plan provided today and agrees to view in MyChart. Active MyChart status and patient understanding of how to access instructions and  care plan via MyChart confirmed with patient.     The patient has been provided with contact information for the care management team and has been advised to call with any health related questions or concerns.   Please call the care guide team at [phone] if you need to cancel or reschedule your appointment.   Please call the Suicide and Crisis Lifeline: 988 call the [place]  National Suicide Prevention Lifeline: 706-044-7218 or TTY: (234)819-7130 TTY [phone]) to talk to a trained counselor if you are experiencing a Mental Health or Behavioral Health Crisis or need someone to talk to.  Medford Balboa, BSN, RN [place]  VBCI - [employer] [facility] RN Care Manager [phone]

## 2024-07-14 NOTE — Transitions of Care (Post Inpatient/ED Visit) (Signed)
 Transition of Care week 3  Visit Note  07/14/2024  Name: Hector Edwards MRN: [id number]          DOB: 04-15-1946  Situation: Patient enrolled in [facility] 30-day program. Visit completed with [name] by telephone.   Background:   Past Medical History:  Diagnosis Date   Depression    GERD (gastroesophageal reflux disease)    Hx of adenomatous colonic polyps 09/04/2015   Hyperlipidemia    Hypertension    LBP (low back pain)    OSA (obstructive sleep apnea) 08/01/2018   Sleep apnea    wears cpap     Assessment: Patient Reported Symptoms: Cognitive Cognitive Status: Alert and oriented to person, place, and time, Normal speech and language skills      Neurological Neurological Review of Symptoms: Weakness Neurological Management Strategies: Medication therapy Neurological Comment: Weakness to his left hand due to CVA. He states he has some improvement in his hand  HEENT HEENT Symptoms Reported: No symptoms reported      Cardiovascular Cardiovascular Symptoms Reported: No symptoms reported Does patient have uncontrolled Hypertension?: No Cardiovascular Management Strategies: Medical device, Medication therapy, Routine screening, Coping strategies Weight: 295 lb (133.8 kg) Cardiovascular Comment: The cardiac monitor is not working and he is calling the compnay to see what to do  Respiratory Respiratory Symptoms Reported: No symptoms reported    Endocrine Endocrine Symptoms Reported: No symptoms reported Is patient diabetic?: No    Gastrointestinal Gastrointestinal Symptoms Reported: No symptoms reported      Genitourinary Genitourinary Symptoms Reported: No symptoms reported    Integumentary Integumentary Symptoms Reported: No symptoms reported    Musculoskeletal Musculoskelatal Symptoms Reviewed: Weakness Additional Musculoskeletal Details: The patient states his weakness and mobility have improved Musculoskeletal Management Strategies: Exercise, Activity, Medical  device, Routine screening Musculoskeletal Comment: His mobility is improving. He is working with HHPT      Psychosocial Psychosocial Symptoms Reported: No symptoms reported         There were no vitals filed for this visit.  Medications Reviewed Today     Reviewed by Moises Reusing, RN (Case Manager) on 07/14/24 at 1420  Med List Status: <None>   Medication Order Taking? Sig Documenting Provider Last Dose Status Informant  alfuzosin  (UROXATRAL ) 10 MG 24 hr tablet [id number]  Take 1 tablet (10 mg total) by mouth daily with breakfast. Saguier, [place], PA-C  Active   apixaban  (ELIQUIS ) 5 MG TABS tablet [id number]  Take 1 tablet (5 mg total) by mouth 2 (two) times daily. Toberman, Stevi W, NP  Active   atorvastatin  (LIPITOR ) 80 MG tablet [id number]  Take 1 tablet (80 mg total) by mouth daily with breakfast. Verlin Lonni BIRCH, MD  Active Self, Spouse/Significant Other, Pharmacy Records, Multiple Informants  benazepril  (LOTENSIN ) 20 MG tablet [id number]  Take 1 tablet (20 mg total) by mouth daily with supper. Verlin Lonni BIRCH, MD  Active Self, Spouse/Significant Other, Pharmacy Records, Multiple Informants  diltiazem  (CARDIZEM  CD) 180 MG 24 hr capsule [id number]  Take 1 capsule (180 mg total) by mouth every morning. Verlin Lonni BIRCH, MD  Active Self, Spouse/Significant Other, Pharmacy Records, Multiple Informants  pantoprazole  (PROTONIX ) 40 MG tablet [id number]  Take 1 tablet (40 mg total) by mouth every evening. Please make appointment for refills [name], FNP  Active Spouse/Significant Other, Self, Pharmacy Records, Multiple Informants  spironolactone  (ALDACTONE ) 25 MG tablet [id number]  Take 1 tablet (25 mg total) by mouth every morning. Parthenia Olivia HERO, PA-C  Active Self, Spouse/Significant Other, Pharmacy  Records, Multiple Informants  Med List Note [name] [name], CPhT 10/12/23 2039): [name] [phone].             Recommendation:   Continue  Current Plan of Care  Follow Up Plan:   Telephone follow-up in 1 week  Medford Balboa, BSN, RN [place]  VBCI - [facility] RN Care Manager [phone]

## 2024-07-15 ENCOUNTER — Other Ambulatory Visit: Payer: Self-pay

## 2024-07-16 ENCOUNTER — Other Ambulatory Visit: Payer: Self-pay

## 2024-07-17 ENCOUNTER — Other Ambulatory Visit: Payer: Self-pay

## 2024-07-21 ENCOUNTER — Other Ambulatory Visit: Payer: Self-pay

## 2024-07-21 ENCOUNTER — Ambulatory Visit (INDEPENDENT_AMBULATORY_CARE_PROVIDER_SITE_OTHER)

## 2024-07-21 ENCOUNTER — Other Ambulatory Visit (HOSPITAL_COMMUNITY): Payer: Self-pay

## 2024-07-21 VITALS — BP 130/85 | HR 91 | Temp 98.0°F | Ht 76.0 in | Wt 293.0 lb

## 2024-07-21 DIAGNOSIS — R059 Cough, unspecified: Secondary | ICD-10-CM | POA: Diagnosis not present

## 2024-07-21 DIAGNOSIS — J45909 Unspecified asthma, uncomplicated: Secondary | ICD-10-CM

## 2024-07-21 DIAGNOSIS — G4733 Obstructive sleep apnea (adult) (pediatric): Secondary | ICD-10-CM | POA: Diagnosis not present

## 2024-07-21 DIAGNOSIS — I700 Atherosclerosis of aorta: Secondary | ICD-10-CM | POA: Diagnosis not present

## 2024-07-21 MED ORDER — WIXELA INHUB 100-50 MCG/ACT IN AEPB: 1.0000 | INHALATION_SPRAY | Freq: Two times a day (BID) | RESPIRATORY_TRACT | 12 refills | Status: AC

## 2024-07-21 NOTE — Patient Instructions (Addendum)
 Notification of test results are managed in the following manner: If there are any recommendations or changes to the plan of care discussed in office today, we will contact you and let you know what they are. If you do not hear from us , then your results are normal/expected and you can view them through your MyChart account, or a letter will be sent to you. Thank you again for trusting us  with your care [facility].  Will get sleep studies and PFTs. Inhaler as prescribed.

## 2024-07-21 NOTE — Transitions of Care (Post Inpatient/ED Visit) (Signed)
 Transition of Care week 4  Visit Note  07/21/2024  Name: Hector Edwards MRN: [id number]          DOB: 1946-04-10  Situation: Patient enrolled in [facility] 30-day program. Visit completed with [name] by telephone.   Background:   Initial Transition Care Management Follow-up Telephone Call Discharge Date and Diagnosis: 06/29/24, CVA   Past Medical History:  Diagnosis Date   Depression    GERD (gastroesophageal reflux disease)    Hx of adenomatous colonic polyps 09/04/2015   Hyperlipidemia    Hypertension    LBP (low back pain)    OSA (obstructive sleep apnea) 08/01/2018   Sleep apnea    wears cpap     Assessment: Patient Reported Symptoms: Cognitive Cognitive Status: Alert and oriented to person, place, and time, Normal speech and language skills      Neurological Neurological Review of Symptoms: Weakness Neurological Management Strategies: Routine screening, Exercise Neurological Comment: The patient states he feels like his hand strength is about 90% back  HEENT HEENT Symptoms Reported: Nasal discharge HEENT Comment: Started on Wixela    Cardiovascular Cardiovascular Symptoms Reported: No symptoms reported Cardiovascular Management Strategies: Medical device, Medication therapy, Exercise, Coping strategies, Activity Cardiovascular Comment: The patient states he feels recovered from his stroke. He is driving  Respiratory Respiratory Symptoms Reported: Dry cough Additional Respiratory Details: started on Wixela    Endocrine Endocrine Symptoms Reported: No symptoms reported Is patient diabetic?: No    Gastrointestinal Gastrointestinal Symptoms Reported: No symptoms reported      Genitourinary Genitourinary Symptoms Reported: No symptoms reported    Integumentary Integumentary Symptoms Reported: No symptoms reported    Musculoskeletal   Musculoskeletal Management Strategies: Activity, Routine screening, Coping strategies Musculoskeletal Comment: Continues with  HHPT. He is driving      Psychosocial Psychosocial Symptoms Reported: No symptoms reported         There were no vitals filed for this visit.  Medications Reviewed Today     Reviewed by Moises Reusing, RN (Case Manager) on 07/21/24 at 1403  Med List Status: <None>   Medication Order Taking? Sig Documenting Provider Last Dose Status Informant  alfuzosin  (UROXATRAL ) 10 MG 24 hr tablet [id number]  Take 1 tablet (10 mg total) by mouth daily with breakfast. Saguier, [place], PA-C  Active   apixaban  (ELIQUIS ) 5 MG TABS tablet [id number]  Take 1 tablet (5 mg total) by mouth 2 (two) times daily. Toberman, Stevi W, NP  Active   atorvastatin  (LIPITOR ) 80 MG tablet [id number]  Take 1 tablet (80 mg total) by mouth daily with breakfast. Verlin Lonni BIRCH, MD  Active Self, Spouse/Significant Other, Pharmacy Records, Multiple Informants  benazepril  (LOTENSIN ) 20 MG tablet [id number]  Take 1 tablet (20 mg total) by mouth daily with supper. Verlin Lonni BIRCH, MD  Active Self, Spouse/Significant Other, Pharmacy Records, Multiple Informants  diltiazem  (CARDIZEM  CD) 180 MG 24 hr capsule [id number]  Take 1 capsule (180 mg total) by mouth every morning. Verlin Lonni BIRCH, MD  Active Self, Spouse/Significant Other, Pharmacy Records, Multiple Informants  fluticasone -salmeterol (WIXELA INHUB) 100-50 MCG/ACT AEPB [id number]  Inhale 1 puff into the lungs 2 (two) times daily. Pawar, Rahul, MD  Active   pantoprazole  (PROTONIX ) 40 MG tablet [id number]  Take 1 tablet (40 mg total) by mouth every evening. Please make appointment for refills [name], FNP  Active Spouse/Significant Other, Self, Pharmacy Records, Multiple Informants  spironolactone  (ALDACTONE ) 25 MG tablet [id number]  Take 1 tablet (25 mg total) by mouth every morning.  Parthenia Olivia HERO, PA-C  Active Self, Spouse/Significant Other, Pharmacy Records, Multiple Informants  Med List Note (Soremekun, Olalekan O, CPhT 10/12/23 2039): [name]  [phone].             Recommendation:   Continue Current Plan of Care  Follow Up Plan:   Telephone follow-up in 1 week  Medford Balboa, BSN, RN [place]  VBCI - [facility] Health RN Care Manager [phone]

## 2024-07-21 NOTE — Progress Notes (Signed)
 New Patient Pulmonology Office Visit   Subjective:  Patient ID: Hector Edwards, male    DOB: December 28, 1945  MRN: [id number]  Referred by: Parthenia Olivia HERO, PA-C  CC: No chief complaint on file.   HPI Hector Edwards is a 78 y.o. male with hypertension, CAD s/p CABG, atrial fibrillation on Eliquis , GERD, OSA previously on CPAP presents for evaluation of OSA. Admitted to the hospital in September 2025: For CVA. Improvement in left sided weakness but still some residual.   SOB on exertion for long time. ET 1/4 mile. Chronic cough w no phlegm. No CP.  No triggers to cough.  Sneezing every day.  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   OSA history: Severe OSA diagnosed in 2019.  Was started on auto CPAP.  Patient stopped using it since 3 years. Tried it but unable to tolerate. Pulled it off. Did not like FFM.   Mouth breather: yes Preferred sleeping position: side.   Sleep related Symptoms:  Snoring- y Witnessed apnea- y Gasping/choking- y morning HA/dry mouth- no/no tired on awakening, excessive daytime sleepiness- y  Sleep routine:  -Bed: May sleep in recliner and then go to bed 10p. Falls asleep in 30 min.  -Nocturnal awakenings: 4-6 time.  -Wake: 7-8a.  -Napping:no  Social Hist/Habits:  -Caffeine: no -Alcohol: 2 beer 2 times a week.  -Nicotine:no -Occupation: was a Advertising account planner. Has donkeys, horses and llama on farm. Does not farm.    PRIOR TESTS and IMAGING: PSG/HSAT: HST 2019: AHI 66, AI 4.2, mixed apneas 6.4, desaturation for 94 minutes.  O2 nadir 65.  Banding desaturation and V-shaped desaturation seen.  Suspect complex sleep apnea.  ECHO: September 2025: EF 70%, grade 1 diastolic dysfunction.  MRI Brain: September 2025: Scattered small acute right cerebral infarcts including precentral gyrus.     07/21/2024   10:00 AM 11/10/2018   10:05 AM 07/16/2018    3:00 PM  Results of the Epworth flowsheet  Sitting and reading 2 1 1   Watching TV 2 1 1    Sitting, inactive in a public place (e.g. a theatre or a meeting) 1 0 1  As a passenger in a car for an hour without a break 2 1 1   Lying down to rest in the afternoon when circumstances permit 2 0 1  Sitting and talking to someone 0 0 1  Sitting quietly after a lunch without alcohol 1 1 2   In a car, while stopped for a few minutes in traffic 0 1 2  Total score 10 5 10     Allergies: Hctz [hydrochlorothiazide], Enalapril maleate, Tribenzor [olmesartan -amlodipine -hctz], and Verapamil   Current Outpatient Medications:    alfuzosin  (UROXATRAL ) 10 MG 24 hr tablet, Take 1 tablet (10 mg total) by mouth daily with breakfast., Disp: 30 tablet, Rfl: 11   apixaban  (ELIQUIS ) 5 MG TABS tablet, Take 1 tablet (5 mg total) by mouth 2 (two) times daily., Disp: 60 tablet, Rfl: 1   atorvastatin  (LIPITOR ) 80 MG tablet, Take 1 tablet (80 mg total) by mouth daily with breakfast., Disp: 90 tablet, Rfl: 3   benazepril  (LOTENSIN ) 20 MG tablet, Take 1 tablet (20 mg total) by mouth daily with supper., Disp: 90 tablet, Rfl: 3   diltiazem  (CARDIZEM  CD) 180 MG 24 hr capsule, Take 1 capsule (180 mg total) by mouth every morning., Disp: 90 capsule, Rfl: 3   pantoprazole  (PROTONIX ) 40 MG tablet, Take 1 tablet (40 mg total) by mouth every evening. Please make appointment for  refills, Disp: 90 tablet, Rfl: 3   spironolactone  (ALDACTONE ) 25 MG tablet, Take 1 tablet (25 mg total) by mouth every morning., Disp: 90 tablet, Rfl: 3 Past Medical History:  Diagnosis Date   Depression    GERD (gastroesophageal reflux disease)    Hx of adenomatous colonic polyps 09/04/2015   Hyperlipidemia    Hypertension    LBP (low back pain)    OSA (obstructive sleep apnea) 08/01/2018   Sleep apnea    wears cpap    Past Surgical History:  Procedure Laterality Date   CARDIOVERSION N/A 08/08/2022   Procedure: CARDIOVERSION;  Surgeon: Raford Riggs, MD;  Location: [facility] ENDOSCOPY;  Service: Cardiovascular;  Laterality: N/A;   COLONOSCOPY   2004, 2021   hyperplastic polyps x 2   CORONARY ARTERY BYPASS GRAFT N/A 08/03/2022   Procedure: CORONARY ARTERY BYPASS GRAFTING (CABG) 3X, USING RIGHT GREATER SAPHENOUS VEIN AND LEFT INTERNAL MAMMARY ARTERY.;  Surgeon: Lucas Dorise POUR, MD;  Location: [facility] OR;  Service: Open Heart Surgery;  Laterality: N/A;   KNEE ARTHROSCOPY Right    LEFT HEART CATH AND CORONARY ANGIOGRAPHY N/A 08/01/2022   Procedure: LEFT HEART CATH AND CORONARY ANGIOGRAPHY;  Surgeon: Verlin Lonni BIRCH, MD;  Location: [facility] INVASIVE CV LAB;  Service: Cardiovascular;  Laterality: N/A;   PENILE PROSTHESIS IMPLANT N/A 01/25/2020   Procedure: PENILE PROTHESIS INFLATABLE COLOPLAST;  Surgeon: Carolee Sherwood BIRCH DOUGLAS, MD;  Location[facility];  Service: Urology;  Laterality: N/A;   SHOULDER ARTHROSCOPY W/ ROTATOR CUFF REPAIR Left    TEE WITHOUT CARDIOVERSION N/A 08/03/2022   Procedure: TRANSESOPHAGEAL ECHOCARDIOGRAM (TEE);  Surgeon: Lucas Dorise POUR, MD;  Location: [facility] OR;  Service: Open Heart Surgery;  Laterality: N/A;   TEE WITHOUT CARDIOVERSION N/A 08/08/2022   Procedure: TRANSESOPHAGEAL ECHOCARDIOGRAM (TEE);  Surgeon: Raford Riggs, MD;  Location: [facility] ENDOSCOPY;  Service: Cardiovascular;  Laterality: N/A;   TONSILLECTOMY  as child   vocal cord growth removed  age 40   benign   WISDOM TOOTH EXTRACTION     Family History  Problem Relation Age of Onset   Stroke Mother 67   Depression Mother    Hypertension Father    Heart disease Father    Colon polyps Father    Pancreatic cancer Brother    Colon polyps Brother    Throat cancer Brother    Breast cancer Daughter    Hypertension Other    Colon cancer Neg Hx    Esophageal cancer Neg Hx    Stomach cancer Neg Hx    Rectal cancer Neg Hx    Social History   Socioeconomic History   Marital status: Divorced    Spouse name: Not on file   Number of children: 1   Years of education: Not on file   Highest education level: Not on file  Occupational History    Occupation: Retired   Occupation: Retired-mailman  Tobacco Use   Smoking status: Never   Smokeless tobacco: Never   Tobacco comments:    Never smoke 08/22/22  Vaping Use   Vaping status: Never Used  Substance and Sexual Activity   Alcohol use: Not Currently    Alcohol/week: 4.0 standard drinks of alcohol    Types: 4 Cans of beer per week    Comment: stop drinking 08/22/22   Drug use: No   Sexual activity: Not Currently  Other Topics Concern   Not on file  Social History Narrative   Regular exercise- yes, seldom; working physically all the time.  Social Drivers of Corporate investment banker Strain: Low Risk  (03/06/2022)   Overall Financial Resource Strain (CARDIA)    Difficulty of Paying Living Expenses: Not hard at all  Food Insecurity: No Food Insecurity (06/30/2024)   Hunger Vital Sign    Worried About Running Out of Food in the Last Year: Never true    Ran Out of Food in the Last Year: Never true  Transportation Needs: No Transportation Needs (06/30/2024)   PRAPARE - Administrator, Civil Service (Medical): No    Lack of Transportation (Non-Medical): No  Physical Activity: Inactive (03/06/2022)   Exercise Vital Sign    Days of Exercise per Week: 0 days    Minutes of Exercise per Session: 0 min  Stress: No Stress Concern Present (03/06/2022)   [employer] of Occupational Health - Occupational Stress Questionnaire    Feeling of Stress : Not at all  Social Connections: Moderately Isolated (06/28/2024)   Social Connection and Isolation Panel    Frequency of Communication with Friends and Family: More than three times a week    Frequency of Social Gatherings with Friends and Family: More than three times a week    Attends Religious Services: Never    Database administrator or Organizations: Yes    Attends Engineer, structural: More than 4 times per year    Marital Status: Divorced  Intimate Partner Violence: Not At Risk (06/30/2024)   Humiliation,  Afraid, Rape, and Kick questionnaire    Fear of Current or Ex-Partner: No    Emotionally Abused: No    Physically Abused: No    Sexually Abused: No       Objective:  BP 130/85   Pulse 91   Temp 98 F (36.7 C) (Oral)   Ht 6' 4 (1.93 m)   Wt 293 lb (132.9 kg)   SpO2 94%   BMI 35.67 kg/m  BMI Readings from Last 3 Encounters:  07/21/24 35.67 kg/m  07/14/24 35.91 kg/m  07/07/24 35.42 kg/m    Physical Exam: CONSTITUTIONAL: NAD, well-appearing NASAL/OROPHARYNX:  Normal mucosa. No septal deviation. No hypertrophy of inferior turbinates. Modified Mallampati score 3.  CV: RRR s1s2 nl, no murmurs  RESP: Clear to auscultation, normal respiratory effort   NEURO: CN II/XII grossly intact PSYCH: Alert & oriented x 3, Euthymic, appropriate affect  Diagnostic Review:  Last metabolic panel Lab Results  Component Value Date   GLUCOSE 123 (H) 07/06/2024   NA 137 07/06/2024   K 3.8 07/06/2024   CL 101 07/06/2024   CO2 28 07/06/2024   BUN 11 07/06/2024   CREATININE 0.84 07/06/2024   GFR 85.05 07/06/2024   CALCIUM  9.6 07/06/2024   PROT 7.1 07/06/2024   ALBUMIN  4.2 07/06/2024   LABGLOB 3.1 06/16/2019   AGRATIO 1.4 06/16/2019   BILITOT 0.9 07/06/2024   ALKPHOS 84 07/06/2024   AST 19 07/06/2024   ALT 32 07/06/2024   ANIONGAP 10 06/27/2024         Assessment & Plan:   Assessment & Plan OSA (obstructive sleep apnea) Suspected complex sleep apnea.  Banding desaturation on HST along with central and mixed apneas.  Difficulty tolerating CPAP in the past. I discussed with the patient the pathophysiology of obstructive sleep apnea, its association with weight, and its negative effects on hypertension, diabetes, mental health, A-fib, stroke if left untreated.  I briefly discussed the treatment options for obstructive sleep apnea  Will get an in-lab PSG. Side sleep. Will start  up with CPAP after PSG.  May need Diamox in future.  Orders:   Nocturnal polysomnography;  Future  Mild asthma without complication, unspecified whether persistent Has cough along with nasal drainage.   Suspect underlying allergic component. Trial of Wixela for cough. Rule out infection. Orders:   Pulmonary Function Test; Future   DG Chest 2 View; Future    He was counselled about not driving while drowsy which is common side effect of sleep related disorders.   No follow-ups on file.   Time spent: 30 min.   Sinia Antosh, MD

## 2024-07-21 NOTE — Assessment & Plan Note (Addendum)
 Suspected complex sleep apnea.  Banding desaturation on HST along with central and mixed apneas.  Difficulty tolerating CPAP in the past. I discussed with the patient the pathophysiology of obstructive sleep apnea, its association with weight, and its negative effects on hypertension, diabetes, mental health, A-fib, stroke if left untreated.  I briefly discussed the treatment options for obstructive sleep apnea  Will get an in-lab PSG. Side sleep. Will start up with CPAP after PSG.  May need Diamox in future.  Orders:   Nocturnal polysomnography; Future

## 2024-07-21 NOTE — Patient Instructions (Signed)
 Visit Information  Thank you for taking time to visit with me today. Please don't hesitate to contact me if I can be of assistance to you before our next scheduled telephone appointment.  Our next appointment is by telephone on Tuesday October 21st at 1:00pm  Following is a copy of your care plan:   Goals Addressed             This Visit's Progress    VBCI Transitions of Care (TOC) Care Plan       Problems: (reviewed 07/21/24) Recent Hospitalization for treatment of Atrial Fibrillation, CVA Hospital or ED Adm Risk of 79%  Goal:  (reviewed 07/21/24) Over the next 30 days, the patient will not experience hospital readmission  Interventions:  (reviewed 07/21/24)  Stroke: Reviewed Importance of taking all medications as prescribed Reviewed Importance of attending all scheduled provider appointments Advised to report any changes in symptoms or exercise tolerance Assessed for signs and symptoms of stroke Reviewed referrals to home health Reviewed the importance of exercise Reviewed the importance of following a heart healthy diet Take Eliquis  twice daily - monitor for excessive bleeding 07/07/24 - Wear Cardiac Monitor for 30 Days for A-Fib 07/07/24 - HEP as directed by OT for left hand strength 10/14 - Completed Pulmonology referral for sleep apnea. Started on Wixela for nasal drainage and cough 10/14 - Continue work with PT for hand therapy and increase strength  Patient Self Care Activities:  (reviewed 07/21/24) Attend all scheduled provider appointments Call pharmacy for medication refills 3-7 days in advance of running out of medications Call provider office for new concerns or questions  Notify RN Care Manager of [facility] call rescheduling needs Participate in Transition of Care Program/Attend [facility] scheduled calls Perform all self care activities independently  Take medications as prescribed    Plan:  Telephone follow up appointment with care management team member scheduled  for:  Tuesday October 21st at 1:00pm        Patient verbalizes understanding of instructions and care plan provided today and agrees to view in MyChart. Active MyChart status and patient understanding of how to access instructions and care plan via MyChart confirmed with patient.     The patient has been provided with contact information for the care management team and has been advised to call with any health related questions or concerns.   Please call the care guide team at [phone] if you need to cancel or reschedule your appointment.   Please call the Suicide and Crisis Lifeline: 988 call the [place]  National Suicide Prevention Lifeline: 985-357-0759 or TTY: 641-731-9761 TTY 720-146-7477) to talk to a trained counselor if you are experiencing a Mental Health or Behavioral Health Crisis or need someone to talk to.  Medford Balboa, BSN, RN [place]  VBCI - [employer] [facility] RN Care Manager [phone]

## 2024-07-22 ENCOUNTER — Other Ambulatory Visit: Payer: Self-pay

## 2024-07-24 ENCOUNTER — Telehealth: Payer: Self-pay | Admitting: Medical

## 2024-07-24 ENCOUNTER — Ambulatory Visit: Payer: Self-pay

## 2024-07-24 NOTE — Telephone Encounter (Signed)
 Pt has an upcoming office visit appt and a TOC later after words. ES is already the pcp from having a hospital follow up on 07/02/24. Does ES want to keep the [facility] or just accept the pt and cancel the [facility] since he has already seen edward previously?

## 2024-07-26 NOTE — Progress Notes (Unsigned)
 PATIENT: Hector Edwards DOB: April 07, 1946  REASON FOR VISIT: follow up HISTORY FROM: patient PRIMARY NEUROLOGIST: Dr. Rosemarie  No chief complaint on file.    HISTORY OF PRESENT ILLNESS: Today   Hector Edwards is a 78 y.o. male who has been followed in this office for ***. Returns today for follow-up.   Imaging:  MRI brain: IMPRESSION: 1. Scattered small acute right cerebral infarcts including in the precentral gyrus. 2. Mild-to-moderate chronic small vessel ischemic disease  CTA head/neck: IMPRESSION: 1. Severe stenosis or short segment occlusion of the distal V4 segment of the nondominant left vertebral artery. 2. Prominent soft plaque in the right carotid bulb with 60% stenosis of the ICA origin. 3. Moderate proximal left P2 stenosis. 4. Suspected 1.7 cm right thyroid  nodule. Recommend non-emergent thyroid  ultrasound. Reference: J Am Coll Radiol. 2015 Feb;12(2): 143-50 5.  Aortic Atherosclerosis (ICD10-I70.0).  FU: Labs: Carotid dopplers? ECHO: Discharge note Work? OSA?   HISTORY (copied from Hospital)Hector Edwards is a 78 y.o. male with history of  hypertension, hyperlipidemia, CAD status post 3 vessel CABG 07/2022 with postop AF with RVR, AF s/p cardioversion in November 2023 on Eliquis  and amiodarone  until 2024 with discontinuation following cardiac monitor presenting for evaluation of sudden onset of left-sided weakness and sensory changes.  Last known well at 4:15 PM when he pulled into a gas station where he usually goes, and upon trying to get out from his car, was unable to use the left arm.  Mild facial droop, slurred speech, minimal left leg weakness.  NIH on Admission 4   Scattered right cerebral acute ischemic infarct: right frontal, parietal, occipital lobes Etiology: likely cardioembolic Code Stroke CT head No acute abnormality. ASPECTS 10.    CTA head & neck Severe stenosis or short segment occlusion of the distal V4 segment of the  nondominant left vertebral artery.  Prominent soft plaque in the right carotid bulb with 60% stenosis of the ICA origin. Moderate proximal left P2 stenosis. MRI scattered small acute right cerebral infarcts including the precentral gyrus.  Mild to moderate chronic small vessel ischemic disease. 2D Echo EF 65-70% LDL 37 HgbA1c 5.2 VTE prophylaxis: SCDs, up ad lib aspirin  81 mg daily prior to admission, now Eliquis  dosing per pharmacy Therapy recommendations:  Home Health PT and OT Disposition: Discharged to home   History of Atrial Fibrillation AF with RVR s/p three-vessel CABG in October 2023 Patient underwent cardioversion 08/08/2022 Followed in atrial fibrillation clinic on Eliquis  and amiodarone  Cardiac monitor in 2024 without evidence of atrial fibrillation for 6 months so amiodarone  and Eliquis  subsequently stopped   Hypertension Home meds:  lotensin , cardizem  Stable Blood Pressure Goal: Gradual normotension   CAD s/p CABG Hyperlipidemia Home meds:  Atorvastatin  80mg , resumed in hospital LDL 37, goal < 70 Continue statin at discharge   OSA  Intolerant to CPAP    Obesity BMI 36.285 kg/m2    REVIEW OF SYSTEMS: Out of a complete 14 system review of symptoms, the patient complains only of the following symptoms, and all other reviewed systems are negative.  ALLERGIES: Allergies  Allergen Reactions   Hctz [Hydrochlorothiazide] Shortness Of Breath   Enalapril Maleate Other (See Comments)    cramps   Tribenzor [Olmesartan -Amlodipine -Hctz] Swelling    Fatigue, CP, edema   Verapamil  Other (See Comments)    Fatigue    HOME MEDICATIONS: Outpatient Medications Prior to Visit  Medication Sig Dispense Refill   alfuzosin  (UROXATRAL ) 10 MG 24 hr tablet Take 1 tablet (  10 mg total) by mouth daily with breakfast. 30 tablet 11   apixaban  (ELIQUIS ) 5 MG TABS tablet Take 1 tablet (5 mg total) by mouth 2 (two) times daily. 60 tablet 1   atorvastatin  (LIPITOR ) 80 MG tablet Take 1  tablet (80 mg total) by mouth daily with breakfast. 90 tablet 3   benazepril  (LOTENSIN ) 20 MG tablet Take 1 tablet (20 mg total) by mouth daily with supper. 90 tablet 3   diltiazem  (CARDIZEM  CD) 180 MG 24 hr capsule Take 1 capsule (180 mg total) by mouth every morning. 90 capsule 3   fluticasone -salmeterol (WIXELA INHUB) 100-50 MCG/ACT AEPB Inhale 1 puff into the lungs 2 (two) times daily. 180 each 12   pantoprazole  (PROTONIX ) 40 MG tablet Take 1 tablet (40 mg total) by mouth every evening. Please make appointment for refills 90 tablet 3   spironolactone  (ALDACTONE ) 25 MG tablet Take 1 tablet (25 mg total) by mouth every morning. 90 tablet 3   No facility-administered medications prior to visit.    PAST MEDICAL HISTORY: Past Medical History:  Diagnosis Date   Depression    GERD (gastroesophageal reflux disease)    Hx of adenomatous colonic polyps 09/04/2015   Hyperlipidemia    Hypertension    LBP (low back pain)    OSA (obstructive sleep apnea) 08/01/2018   Sleep apnea    wears cpap     PAST SURGICAL HISTORY: Past Surgical History:  Procedure Laterality Date   CARDIOVERSION N/A 08/08/2022   Procedure: CARDIOVERSION;  Surgeon: Raford Riggs, MD;  Location: [facility] ENDOSCOPY;  Service: Cardiovascular;  Laterality: N/A;   COLONOSCOPY  2004, 2021   hyperplastic polyps x 2   CORONARY ARTERY BYPASS GRAFT N/A 08/03/2022   Procedure: CORONARY ARTERY BYPASS GRAFTING (CABG) 3X, USING RIGHT GREATER SAPHENOUS VEIN AND LEFT INTERNAL MAMMARY ARTERY.;  Surgeon: Lucas Dorise POUR, MD;  Location: [facility] OR;  Service: Open Heart Surgery;  Laterality: N/A;   KNEE ARTHROSCOPY Right    LEFT HEART CATH AND CORONARY ANGIOGRAPHY N/A 08/01/2022   Procedure: LEFT HEART CATH AND CORONARY ANGIOGRAPHY;  Surgeon: Verlin Lonni BIRCH, MD;  Location: [facility] INVASIVE CV LAB;  Service: Cardiovascular;  Laterality: N/A;   PENILE PROSTHESIS IMPLANT N/A 01/25/2020   Procedure: PENILE PROTHESIS INFLATABLE COLOPLAST;  Surgeon:  Carolee Sherwood BIRCH DOUGLAS, MD;  Location: [facility];  Service: Urology;  Laterality: N/A;   SHOULDER ARTHROSCOPY W/ ROTATOR CUFF REPAIR Left    TEE WITHOUT CARDIOVERSION N/A 08/03/2022   Procedure: TRANSESOPHAGEAL ECHOCARDIOGRAM (TEE);  Surgeon: Lucas Dorise POUR, MD;  Location: [facility] OR;  Service: Open Heart Surgery;  Laterality: N/A;   TEE WITHOUT CARDIOVERSION N/A 08/08/2022   Procedure: TRANSESOPHAGEAL ECHOCARDIOGRAM (TEE);  Surgeon: Raford Riggs, MD;  Location: [facility] ENDOSCOPY;  Service: Cardiovascular;  Laterality: N/A;   TONSILLECTOMY  as child   vocal cord growth removed  age 58   benign   WISDOM TOOTH EXTRACTION      FAMILY HISTORY: Family History  Problem Relation Age of Onset   Stroke Mother 77   Depression Mother    Hypertension Father    Heart disease Father    Colon polyps Father    Pancreatic cancer Brother    Colon polyps Brother    Throat cancer Brother    Breast cancer Daughter    Hypertension Other    Colon cancer Neg Hx    Esophageal cancer Neg Hx    Stomach cancer Neg Hx    Rectal cancer Neg Hx  SOCIAL HISTORY: Social History   Socioeconomic History   Marital status: Divorced    Spouse name: Not on file   Number of children: 1   Years of education: Not on file   Highest education level: Not on file  Occupational History   Occupation: Retired   Occupation: Retired-mailman  Tobacco Use   Smoking status: Never   Smokeless tobacco: Never   Tobacco comments:    Never smoke 08/22/22  Vaping Use   Vaping status: Never Used  Substance and Sexual Activity   Alcohol use: Not Currently    Alcohol/week: 4.0 standard drinks of alcohol    Types: 4 Cans of beer per week    Comment: stop drinking 08/22/22   Drug use: No   Sexual activity: Not Currently  Other Topics Concern   Not on file  Social History Narrative   Regular exercise- yes, seldom; working physically all the time.   Social Drivers of Corporate investment banker Strain: Low  Risk  (03/06/2022)   Overall Financial Resource Strain (CARDIA)    Difficulty of Paying Living Expenses: Not hard at all  Food Insecurity: No Food Insecurity (06/30/2024)   Hunger Vital Sign    Worried About Running Out of Food in the Last Year: Never true    Ran Out of Food in the Last Year: Never true  Transportation Needs: No Transportation Needs (06/30/2024)   PRAPARE - Administrator, Civil Service (Medical): No    Lack of Transportation (Non-Medical): No  Physical Activity: Inactive (03/06/2022)   Exercise Vital Sign    Days of Exercise per Week: 0 days    Minutes of Exercise per Session: 0 min  Stress: No Stress Concern Present (03/06/2022)   [employer] of Occupational Health - Occupational Stress Questionnaire    Feeling of Stress : Not at all  Social Connections: Moderately Isolated (06/28/2024)   Social Connection and Isolation Panel    Frequency of Communication with Friends and Family: More than three times a week    Frequency of Social Gatherings with Friends and Family: More than three times a week    Attends Religious Services: Never    Database administrator or Organizations: Yes    Attends Engineer, structural: More than 4 times per year    Marital Status: Divorced  Intimate Partner Violence: Not At Risk (06/30/2024)   Humiliation, Afraid, Rape, and Kick questionnaire    Fear of Current or Ex-Partner: No    Emotionally Abused: No    Physically Abused: No    Sexually Abused: No      PHYSICAL EXAM  There were no vitals filed for this visit. There is no height or weight on file to calculate BMI.  Generalized: Well developed, in no acute distress   Neurological examination  Mentation: Alert oriented to time, place, history taking. Follows all commands speech and language fluent Cranial nerve II-XII: Pupils were equal round reactive to light. Extraocular movements were full, visual field were full on confrontational test. Facial sensation  and strength were normal. Uvula tongue midline. Head turning and shoulder shrug  were normal and symmetric. Motor: The motor testing reveals 5 over 5 strength of all 4 extremities. Good symmetric motor tone is noted throughout.  Sensory: Sensory testing is intact to soft touch on all 4 extremities. No evidence of extinction is noted.  Coordination: Cerebellar testing reveals good finger-nose-finger and heel-to-shin bilaterally.  Gait and station: Gait is normal. Tandem gait is  normal. Romberg is negative. No drift is seen.  Reflexes: Deep tendon reflexes are symmetric and normal bilaterally.   DIAGNOSTIC DATA (LABS, IMAGING, TESTING) - I reviewed patient records, labs, notes, testing and imaging myself where available.  Lab Results  Component Value Date   WBC 9.5 07/02/2024   HGB 14.3 07/02/2024   HCT 41.3 07/02/2024   MCV 99.8 07/02/2024   PLT 195.0 07/02/2024      Component Value Date/Time   NA 137 07/06/2024 1010   NA 135 05/12/2024 1026   K 3.8 07/06/2024 1010   CL 101 07/06/2024 1010   CO2 28 07/06/2024 1010   GLUCOSE 123 (H) 07/06/2024 1010   BUN 11 07/06/2024 1010   BUN 21 05/12/2024 1026   CREATININE 0.84 07/06/2024 1010   CREATININE 0.87 05/05/2014 0948   CALCIUM  9.6 07/06/2024 1010   PROT 7.1 07/06/2024 1010   PROT 7.5 06/16/2019 1035   ALBUMIN  4.2 07/06/2024 1010   ALBUMIN  4.4 06/16/2019 1035   AST 19 07/06/2024 1010   ALT 32 07/06/2024 1010   ALKPHOS 84 07/06/2024 1010   BILITOT 0.9 07/06/2024 1010   BILITOT 0.6 06/16/2019 1035   GFRNONAA >60 06/27/2024 1717   GFRNONAA >89 05/05/2014 0948   GFRAA 103 06/16/2019 1035   GFRAA >89 05/05/2014 0948   Lab Results  Component Value Date   CHOL 76 06/28/2024   HDL 29 (L) 06/28/2024   LDLCALC 37 06/28/2024   LDLDIRECT 158.5 10/19/2008   TRIG 52 06/28/2024   CHOLHDL 2.6 06/28/2024   Lab Results  Component Value Date   HGBA1C 5.2 06/27/2024   Lab Results  Component Value Date   VITAMINB12 783 09/04/2022    Lab Results  Component Value Date   TSH 2.37 06/05/2022      ASSESSMENT AND PLAN 78 y.o. year old male  has a past medical history of Depression, GERD (gastroesophageal reflux disease), adenomatous colonic polyps (09/04/2015), Hyperlipidemia, Hypertension, LBP (low back pain), OSA (obstructive sleep apnea) (08/01/2018), and Sleep apnea. here with ***     Continue {anticoagulants:31417}  and ***  for secondary stroke prevention.   Discussed secondary stroke prevention measures and importance of close PCP follow up for aggressive stroke risk factor management. I have gone over the pathophysiology of stroke, warning signs and symptoms, risk factors and their management in some detail with instructions to go to the closest emergency room for symptoms of concern. HTN: BP goal <130/90.  Stable on *** per PCP HLD: LDL goal <70. Recent LDL ***.  DMII: A1c goal<7.0. Recent A1c ***.  Encouraged patient to monitor diet and encouraged exercise FU with our office ***  No orders of the defined types were placed in this encounter.  No orders of the defined types were placed in this encounter.     Duwaine Russell, MSN, NP-C 07/26/2024, 2:39 PM [facility] [address] [place], [place] [address] [phone]

## 2024-07-27 ENCOUNTER — Ambulatory Visit: Admitting: Adult Health

## 2024-07-27 ENCOUNTER — Encounter: Payer: Self-pay | Admitting: Adult Health

## 2024-07-27 VITALS — BP 139/78 | HR 81 | Ht 76.0 in | Wt 300.0 lb

## 2024-07-27 DIAGNOSIS — Z8639 Personal history of other endocrine, nutritional and metabolic disease: Secondary | ICD-10-CM

## 2024-07-27 DIAGNOSIS — G4733 Obstructive sleep apnea (adult) (pediatric): Secondary | ICD-10-CM | POA: Diagnosis not present

## 2024-07-27 DIAGNOSIS — I639 Cerebral infarction, unspecified: Secondary | ICD-10-CM | POA: Diagnosis not present

## 2024-07-27 DIAGNOSIS — Z8679 Personal history of other diseases of the circulatory system: Secondary | ICD-10-CM

## 2024-07-27 NOTE — Patient Instructions (Signed)
 Your Plan:  Continue Eliquis   Blood pressure goal <130/90 Cholesterol LDL goal <70 Diabetes goal A1c <7 Monitor diet and try to exercise   Thank you for coming to see [place] at [facility]. I hope we have been able to provide you high quality care today.  You may receive a patient satisfaction survey over the next few weeks. We would appreciate your feedback and comments so that we may continue to improve ourselves and the health of our patients.

## 2024-07-28 ENCOUNTER — Other Ambulatory Visit: Payer: Self-pay

## 2024-07-28 NOTE — Patient Instructions (Signed)
 Visit Information  Thank you for taking time to visit with me today. Please don't hesitate to contact me if I can be of assistance to you before our next scheduled telephone appointment.   Following is a copy of your care plan:   Goals Addressed             This Visit's Progress    COMPLETED: VBCI Transitions of Care (TOC) Care Plan       Problems: (reviewed 07/28/24) Recent Hospitalization for treatment of Atrial Fibrillation, CVA Hospital or ED Adm Risk of 79%  Goal:  (reviewed 07/28/24) Over the next 30 days, the patient will not experience hospital readmission  Interventions:  (reviewed 07/28/24)  Stroke: Reviewed Importance of taking all medications as prescribed Reviewed Importance of attending all scheduled provider appointments Advised to report any changes in symptoms or exercise tolerance Assessed for signs and symptoms of stroke Reviewed referrals to home health Reviewed the importance of exercise Reviewed the importance of following a heart healthy diet Take Eliquis  twice daily - monitor for excessive bleeding 07/07/24 - Wear Cardiac Monitor for 30 Days for A-Fib 07/07/24 - HEP as directed by OT for left hand strength 10/14 - Completed Pulmonology referral for sleep apnea. Started on Wixela for nasal drainage and cough 10/14 - Continue work with PT for hand therapy and increase strength  Patient Self Care Activities:  (reviewed 07/28/24) Attend all scheduled provider appointments Call pharmacy for medication refills 3-7 days in advance of running out of medications Call provider office for new concerns or questions  Notify RN Care Manager of TOC call rescheduling needs Participate in Transition of Care Program/Attend TOC scheduled calls Perform all self care activities independently  Take medications as prescribed    Plan:  Telephone follow up appointment with care management team member scheduled for:  The patient has met his goals. Discharged from [facility]         Patient verbalizes understanding of instructions and care plan provided today and agrees to view in MyChart. Active MyChart status and patient understanding of how to access instructions and care plan via MyChart confirmed with patient.     The patient has been provided with contact information for the care management team and has been advised to call with any health related questions or concerns.   Please call the care guide team at [phone] if you need to cancel or reschedule your appointment.   Please call the Suicide and Crisis Lifeline: 988 call the [place]  National Suicide Prevention Lifeline: 704-295-3190 or TTY: 801-802-0657 TTY (314)648-0657) to talk to a trained counselor if you are experiencing a Mental Health or Behavioral Health Crisis or need someone to talk to.  Medford Balboa, BSN, RN [place]  VBCI - [employer] [facility] RN Care Manager [phone]

## 2024-07-28 NOTE — Transitions of Care (Post Inpatient/ED Visit) (Signed)
 Transition of Care week 5  Visit Note  07/28/2024  Name: Hector Edwards MRN: [id number]          DOB: 03-27-1946  Situation: Patient enrolled in [facility] 30-day program. Visit completed with Hector Edwards by telephone.   Background:   Initial Transition Care Management Follow-up Telephone Call Discharge Date and Diagnosis: 06/29/24, CVA   Past Medical History:  Diagnosis Date   Depression    GERD (gastroesophageal reflux disease)    Hx of adenomatous colonic polyps 09/04/2015   Hyperlipidemia    Hypertension    LBP (low back pain)    OSA (obstructive sleep apnea) 08/01/2018   Sleep apnea    wears cpap     Assessment: Patient Reported Symptoms: Cognitive Cognitive Status: Alert and oriented to person, place, and time, Normal speech and language skills      Neurological Neurological Review of Symptoms: Weakness Neurological Comment: Left hand is still a little weak  HEENT HEENT Symptoms Reported: Nasal discharge HEENT Comment: Wixela has not been started    Cardiovascular Cardiovascular Symptoms Reported: No symptoms reported Does patient have uncontrolled Hypertension?: No Cardiovascular Management Strategies: Medical device, Medication therapy, Exercise, Coping strategies  Respiratory Respiratory Symptoms Reported: Dry cough Respiratory Management Strategies: Medication therapy, Activity  Endocrine Endocrine Symptoms Reported: No symptoms reported Is patient diabetic?: No    Gastrointestinal Gastrointestinal Symptoms Reported: No symptoms reported      Genitourinary Genitourinary Symptoms Reported: No symptoms reported    Integumentary Integumentary Symptoms Reported: No symptoms reported    Musculoskeletal Musculoskelatal Symptoms Reviewed: No symptoms reported        Psychosocial Psychosocial Symptoms Reported: No symptoms reported         There were no vitals filed for this visit.  Medications Reviewed Today     Reviewed by Hector Reusing, RN  (Case Manager) on 07/28/24 at 1304  Med List Status: <None>   Medication Order Taking? Sig Documenting Provider Last Dose Status Informant  alfuzosin  (UROXATRAL ) 10 MG 24 hr tablet [id number]  Take 1 tablet (10 mg total) by mouth daily with breakfast. Edwards, [place], PA-C  Active   apixaban  (ELIQUIS ) 5 MG TABS tablet [id number]  Take 1 tablet (5 mg total) by mouth 2 (two) times daily. Edwards, Hector W, NP  Active   atorvastatin  (LIPITOR ) 80 MG tablet [id number]  Take 1 tablet (80 mg total) by mouth daily with breakfast. Hector Lonni BIRCH, MD  Active Hector Edwards  benazepril  (LOTENSIN ) 20 MG tablet [id number]  Take 1 tablet (20 mg total) by mouth daily with supper. Hector Lonni BIRCH, MD  Active Hector Edwards  diltiazem  (CARDIZEM  CD) 180 MG 24 hr capsule [id number]  Take 1 capsule (180 mg total) by mouth every morning. Hector Lonni BIRCH, MD  Active Hector Edwards  fluticasone -salmeterol (WIXELA INHUB) 100-50 MCG/ACT AEPB [id number]  Inhale 1 puff into the lungs 2 (two) times daily. Edwards, Rahul, MD  Active   pantoprazole  (PROTONIX ) 40 MG tablet [id number]  Take 1 tablet (40 mg total) by mouth every evening. Please make appointment for refills Hector Leita Repine, FNP  Active Spouse/Significant Other, Self, Pharmacy Records, Hector Edwards  spironolactone  (ALDACTONE ) 25 MG tablet [id number]  Take 1 tablet (25 mg total) by mouth every morning. Hector Olivia HERO, PA-C  Active Hector Edwards  Med List Note (Hector Edwards, CPhT 10/12/23 2039): Hector Edwards [phone].  Recommendation:   Discharge from [facility] 30 Day Program  Follow Up Plan:   Closing From:  Transitions of Care Program  [facility], BSN, RN [place]  VBCI - [facility]  Health RN Care Manager [phone]

## 2024-07-30 NOTE — Progress Notes (Signed)
 I agree with the above plan

## 2024-08-03 ENCOUNTER — Ambulatory Visit (INDEPENDENT_AMBULATORY_CARE_PROVIDER_SITE_OTHER): Admitting: Medical

## 2024-08-03 ENCOUNTER — Other Ambulatory Visit (HOSPITAL_BASED_OUTPATIENT_CLINIC_OR_DEPARTMENT_OTHER): Payer: Self-pay

## 2024-08-03 ENCOUNTER — Ambulatory Visit: Payer: Self-pay | Admitting: Medical

## 2024-08-03 VITALS — BP 122/80 | HR 93 | Resp 15 | Ht 76.0 in | Wt 306.2 lb

## 2024-08-03 DIAGNOSIS — I509 Heart failure, unspecified: Secondary | ICD-10-CM | POA: Diagnosis not present

## 2024-08-03 DIAGNOSIS — J4520 Mild intermittent asthma, uncomplicated: Secondary | ICD-10-CM | POA: Diagnosis not present

## 2024-08-03 DIAGNOSIS — F329 Major depressive disorder, single episode, unspecified: Secondary | ICD-10-CM | POA: Diagnosis not present

## 2024-08-03 DIAGNOSIS — R350 Frequency of micturition: Secondary | ICD-10-CM | POA: Diagnosis not present

## 2024-08-03 DIAGNOSIS — N529 Male erectile dysfunction, unspecified: Secondary | ICD-10-CM

## 2024-08-03 DIAGNOSIS — I480 Paroxysmal atrial fibrillation: Secondary | ICD-10-CM

## 2024-08-03 DIAGNOSIS — R8290 Unspecified abnormal findings in urine: Secondary | ICD-10-CM | POA: Diagnosis not present

## 2024-08-03 DIAGNOSIS — I10 Essential (primary) hypertension: Secondary | ICD-10-CM | POA: Diagnosis not present

## 2024-08-03 DIAGNOSIS — Z8673 Personal history of transient ischemic attack (TIA), and cerebral infarction without residual deficits: Secondary | ICD-10-CM | POA: Diagnosis not present

## 2024-08-03 DIAGNOSIS — R351 Nocturia: Secondary | ICD-10-CM

## 2024-08-03 LAB — POCT URINALYSIS DIPSTICK
Blood, UA: NEGATIVE
Glucose, UA: NEGATIVE
Ketones, UA: NEGATIVE
Leukocytes, UA: NEGATIVE
Nitrite, UA: NEGATIVE
Protein, UA: NEGATIVE
Spec Grav, UA: 1.01 (ref 1.010–1.025)
Urobilinogen, UA: 0.2 U/dL
pH, UA: 6 (ref 5.0–8.0)

## 2024-08-03 LAB — PSA: PSA: 1.88 ng/mL (ref 0.10–4.00)

## 2024-08-03 MED ORDER — SERTRALINE HCL 25 MG PO TABS
25.0000 mg | ORAL_TABLET | Freq: Every day | ORAL | 0 refills | Status: DC
  Filled 2024-08-03: qty 30, 30d supply, fill #0

## 2024-08-03 NOTE — Patient Instructions (Addendum)
 Ischemic stroke.(Hx of) Recent ischemic stroke likely cardioembolic due to atrial fibrillation. Residual left hand deficits, no significant left leg deficits. Only fine motor issues but gross momements fine with left hand - Continue Eliquis  5 mg twice daily. -has followed up with neurologist just recently.   PAF suspected suspected to have contributed to the stroke. No current cardiac monitoring. - currently has monitor for 30 days. - Follow up with cardiologist.   CHF Heart failure with no significant leg swelling. Oxygen  saturation 97%. - Continue Lotensin , diltiazem , and spironolactone . - Last bnp controlled. - Monitor for leg swelling.   Hypertension. bp well controlled. Hypertension management ongoing. Blood pressure 122/80 mmHg, within acceptable range. - Continue current antihypertensive regimen.   Sleep apnea Obstructive sleep apnea with previous CPAP use discontinued. Discussed potential link to hypertension and stroke risk. - Consider  following thru with recommend repeat sleep study and to see if can get equipment you can tolerate - Discuss weight loss for management.  Suspect asthma component to recent night time cough - rx wixella sent to mail order. Contact mail order if they don't send in rx let me know. -can send to local pharmacy.    ED- hx of penile implant -  Depression(reactive) -phq-9 score 8 -rx sertraline 25 mg daily.   Frequent urination mild and odor to urine -psa and urine culture. -Will follow labs and treat based on result. Maybe just bph and concentrated urine that smelled?   Follow up in one month or sooner if needed                    [facility] - [place], [place] - One [facility] AT Portal to Registered [address] [place], [place] [address] Phone: [phone]  Fax: [phone]

## 2024-08-03 NOTE — Progress Notes (Signed)
 Subjective:    Patient ID: Hector Edwards, male    DOB: Apr 19, 1946, 78 y.o.   MRN: [id number]  HPI  Hector Edwards is a 78 year old male with suspected complex sleep apnea and mild asthma who presents for follow-up of his respiratory symptoms and management of his penile implant. He is accompanied by his sister.  He saw a pulmonary doctor approximately two to three weeks ago for suspected complex sleep apnea and mild asthma. He was prescribed Wixela for a cough with postnasal drainage but has not received the inhaler from the mail service yet. He describes a recent episode of waking up at midnight with one episode of severe cough and gagging which he had never experienced before. This occurred only once and has not returned. Following day no reported respiratory issues.  He has a CPAP machine for sleep apnea but does not use it due to difficulty tolerating it in the past. He was advised to undergo another sleep study.  He recalls having an inflatable penile prosthetic implant placed several years ago, which is no longer functioning. He describes the bulb as 'hard as a rock' and difficult to squeeze, rendering the device inoperative for the past two years. He was previously seen by [facility] for this issue.  He has a history of stroke and is currently receiving physical therapy post-stroke. He has also seen a neurologist for follow-up after the stroke  Pt has followed up with neurology post stroke and PT/OT has been treating him as well. Some gradual improvmenet of fine motor function of left hand though still not back to baseline. Hard time buttoning shirt.   Some mild depressoin sine stroke. PHq-9 score of 8. Gad-7 score 0. Pt wants to try low dose sertraline.    Review of Systems  Constitutional:  Negative for chills, fatigue and fever.  HENT:  Negative for congestion, ear discharge and facial swelling.   Respiratory:  Negative for cough, chest tightness, shortness of  breath and wheezing.   Cardiovascular:  Negative for chest pain and palpitations.  Gastrointestinal:  Negative for abdominal pain, diarrhea, rectal pain and vomiting.  Genitourinary:  Positive for frequency.  Musculoskeletal:  Negative for back pain and myalgias.       See hpi  Skin:  Negative for rash.  Neurological:  Negative for dizziness, syncope, weakness and numbness.  Hematological:  Negative for adenopathy. Does not bruise/bleed easily.  Psychiatric/Behavioral:  Positive for dysphoric mood. Negative for behavioral problems, decreased concentration, self-injury and suicidal ideas. The patient is not hyperactive.     Past Medical History:  Diagnosis Date   Depression    GERD (gastroesophageal reflux disease)    Hx of adenomatous colonic polyps 09/04/2015   Hyperlipidemia    Hypertension    LBP (low back pain)    OSA (obstructive sleep apnea) 08/01/2018   Sleep apnea    wears cpap      Social History   Socioeconomic History   Marital status: Divorced    Spouse name: Not on file   Number of children: 1   Years of education: Not on file   Highest education level: Not on file  Occupational History   Occupation: Retired   Occupation: Financial Planner  Tobacco Use   Smoking status: Never   Smokeless tobacco: Never   Tobacco comments:    Never smoke 08/22/22  Vaping Use   Vaping status: Never Used  Substance and Sexual Activity   Alcohol use: Not Currently  Alcohol/week: 4.0 standard drinks of alcohol    Types: 4 Cans of beer per week    Comment: stop drinking 08/22/22   Drug use: No   Sexual activity: Not Currently  Other Topics Concern   Not on file  Social History Narrative   Regular exercise- yes, seldom; working physically all the time.   Social Drivers of Corporate Investment Banker Strain: Low Risk  (03/06/2022)   Overall Financial Resource Strain (CARDIA)    Difficulty of Paying Living Expenses: Not hard at all  Food Insecurity: No Food Insecurity  (06/30/2024)   Hunger Vital Sign    Worried About Running Out of Food in the Last Year: Never true    Ran Out of Food in the Last Year: Never true  Transportation Needs: No Transportation Needs (06/30/2024)   PRAPARE - Administrator, Civil Service (Medical): No    Lack of Transportation (Non-Medical): No  Physical Activity: Inactive (03/06/2022)   Exercise Vital Sign    Days of Exercise per Week: 0 days    Minutes of Exercise per Session: 0 min  Stress: No Stress Concern Present (03/06/2022)   [employer] of Occupational Health - Occupational Stress Questionnaire    Feeling of Stress : Not at all  Social Connections: Moderately Isolated (06/28/2024)   Social Connection and Isolation Panel    Frequency of Communication with Friends and Family: More than three times a week    Frequency of Social Gatherings with Friends and Family: More than three times a week    Attends Religious Services: Never    Database Administrator or Organizations: Yes    Attends Engineer, Structural: More than 4 times per year    Marital Status: Divorced  Intimate Partner Violence: Not At Risk (06/30/2024)   Humiliation, Afraid, Rape, and Kick questionnaire    Fear of Current or Ex-Partner: No    Emotionally Abused: No    Physically Abused: No    Sexually Abused: No    Past Surgical History:  Procedure Laterality Date   CARDIOVERSION N/A 08/08/2022   Procedure: CARDIOVERSION;  Surgeon: Raford Riggs, MD;  Location: [facility] ENDOSCOPY;  Service: Cardiovascular;  Laterality: N/A;   COLONOSCOPY  2004, 2021   hyperplastic polyps x 2   CORONARY ARTERY BYPASS GRAFT N/A 08/03/2022   Procedure: CORONARY ARTERY BYPASS GRAFTING (CABG) 3X, USING RIGHT GREATER SAPHENOUS VEIN AND LEFT INTERNAL MAMMARY ARTERY.;  Surgeon: Lucas Dorise POUR, MD;  Location: [facility] OR;  Service: Open Heart Surgery;  Laterality: N/A;   KNEE ARTHROSCOPY Right    LEFT HEART CATH AND CORONARY ANGIOGRAPHY N/A 08/01/2022    Procedure: LEFT HEART CATH AND CORONARY ANGIOGRAPHY;  Surgeon: Verlin Lonni BIRCH, MD;  Location: [facility] INVASIVE CV LAB;  Service: Cardiovascular;  Laterality: N/A;   PENILE PROSTHESIS IMPLANT N/A 01/25/2020   Procedure: PENILE PROTHESIS INFLATABLE COLOPLAST;  Surgeon: Carolee Sherwood BIRCH DOUGLAS, MD;  Location: [facility];  Service: Urology;  Laterality: N/A;   SHOULDER ARTHROSCOPY W/ ROTATOR CUFF REPAIR Left    TEE WITHOUT CARDIOVERSION N/A 08/03/2022   Procedure: TRANSESOPHAGEAL ECHOCARDIOGRAM (TEE);  Surgeon: Lucas Dorise POUR, MD;  Location: [facility] OR;  Service: Open Heart Surgery;  Laterality: N/A;   TEE WITHOUT CARDIOVERSION N/A 08/08/2022   Procedure: TRANSESOPHAGEAL ECHOCARDIOGRAM (TEE);  Surgeon: Raford Riggs, MD;  Location: [facility] ENDOSCOPY;  Service: Cardiovascular;  Laterality: N/A;   TONSILLECTOMY  as child   vocal cord growth removed  age 75   benign  WISDOM TOOTH EXTRACTION      Family History  Problem Relation Age of Onset   Stroke Mother 68   Depression Mother    Hypertension Father    Heart disease Father    Colon polyps Father    Pancreatic cancer Brother    Colon polyps Brother    Throat cancer Brother    Breast cancer Daughter    Hypertension Other    Colon cancer Neg Hx    Esophageal cancer Neg Hx    Stomach cancer Neg Hx    Rectal cancer Neg Hx     Allergies  Allergen Reactions   Hctz [Hydrochlorothiazide] Shortness Of Breath   Enalapril Maleate Other (See Comments)    cramps   Tribenzor [Olmesartan -Amlodipine -Hctz] Swelling    Fatigue, CP, edema   Verapamil  Other (See Comments)    Fatigue    Current Outpatient Medications on File Prior to Visit  Medication Sig Dispense Refill   alfuzosin  (UROXATRAL ) 10 MG 24 hr tablet Take 1 tablet (10 mg total) by mouth daily with breakfast. 30 tablet 11   apixaban  (ELIQUIS ) 5 MG TABS tablet Take 1 tablet (5 mg total) by mouth 2 (two) times daily. 60 tablet 1   atorvastatin  (LIPITOR ) 80 MG tablet Take 1  tablet (80 mg total) by mouth daily with breakfast. 90 tablet 3   benazepril  (LOTENSIN ) 20 MG tablet Take 1 tablet (20 mg total) by mouth daily with supper. 90 tablet 3   diltiazem  (CARDIZEM  CD) 180 MG 24 hr capsule Take 1 capsule (180 mg total) by mouth every morning. 90 capsule 3   fluticasone -salmeterol (WIXELA INHUB) 100-50 MCG/ACT AEPB Inhale 1 puff into the lungs 2 (two) times daily. 180 each 12   pantoprazole  (PROTONIX ) 40 MG tablet Take 1 tablet (40 mg total) by mouth every evening. Please make appointment for refills 90 tablet 3   spironolactone  (ALDACTONE ) 25 MG tablet Take 1 tablet (25 mg total) by mouth every morning. 90 tablet 3   No current facility-administered medications on file prior to visit.    BP 122/80   Pulse 93   Resp 15   Ht 6' 4 (1.93 m)   Wt (!) 306 lb 3.2 oz (138.9 kg)   SpO2 96%   BMI 37.27 kg/m        Objective:   Physical Exam  General Mental Status- Alert. General Appearance- Not in acute distress.   Skin General: Color- Normal Color. Moisture- Normal Moisture.  Neck Carotid Arteries- Normal color. Moisture- Normal Moisture. No carotid bruits. No JVD.  Chest and Lung Exam Auscultation: Breath Sounds:-CTA  Cardiovascular Auscultation:Rythm- Regular. Murmurs & Other Heart Sounds:Auscultation of the heart reveals- No Murmurs.  Abdomen Inspection:-Inspeection Normal. Palpation/Percussion:Note:No mass. Palpation and Percussion of the abdomen reveal- Non Tender, Non Distended + BS, no rebound or guarding.    Neurologic Cranial Nerve exam:- CN [name]-XII intact(No nystagmus), symmetric smile. Strength:- 5/5 equal and symmetric strength both upper and lower extremities.    Lower ext- no pedal edema. Calfs symetric. Negative homans signs.    Assessment & Plan:   Patient Instructions   Ischemic stroke. Recent ischemic stroke likely cardioembolic due to atrial fibrillation. Residual left hand deficits, no significant left leg deficits.  Only fine motor issues but gross momements fine with left hand - Continue Eliquis  5 mg twice daily. -has followed up with neurologist just recently.   PAF suspected suspected to have contributed to the stroke. No current cardiac monitoring. - currently has monitor for 30 days. -  Follow up with cardiologist.   CHF Heart failure with no significant leg swelling. Oxygen  saturation 97%. - Continue Lotensin , diltiazem , and spironolactone . - Last bnp controlled. - Monitor for leg swelling.   Hypertension. bp well controlled. Hypertension management ongoing. Blood pressure 122/80 mmHg, within acceptable range. - Continue current antihypertensive regimen.   Sleep apnea Obstructive sleep apnea with previous CPAP use discontinued. Discussed potential link to hypertension and stroke risk. - Consider  following thru with recommend repeat sleep study and to see if can get equipment you can tolerate - Discuss weight loss for management.  Suspect asthma component to recent night time cough - rx wixella sent to mail order. Contact mail order if they don't send in rx let me know. -can send to local pharmacy.    ED- hx of penile implant -  Depression(reactive) -phq-9 score 8 -rx sertraline 25 mg daily.   Frequent urination mild and odor to urine -psa and urine culture. -Will follow labs and treat based on result. Maybe just bph and concentrated urine that smelled?   Follow up in one month or sooner if needed                    [facility] - [place], [place] - One [facility] AT Portal to Registered [address] One [place], [place] [address] Phone: [phone]  Fax: [phone]        Dallas Maxwell, PA-C    I personally spent a total of 43 minutes in the care of the patient today including performing a medically appropriate exam/evaluation, counseling and educating, placing orders, and documenting clinical  information in the EHR.

## 2024-08-04 LAB — URINE CULTURE
MICRO NUMBER:: 17151066
Result:: NO GROWTH
SPECIMEN QUALITY:: ADEQUATE

## 2024-08-05 DIAGNOSIS — M1711 Unilateral primary osteoarthritis, right knee: Secondary | ICD-10-CM | POA: Diagnosis not present

## 2024-08-07 ENCOUNTER — Ambulatory Visit: Payer: Self-pay | Admitting: Cardiovascular Disease

## 2024-08-07 ENCOUNTER — Ambulatory Visit: Attending: Cardiovascular Disease

## 2024-08-07 DIAGNOSIS — I6349 Cerebral infarction due to embolism of other cerebral artery: Secondary | ICD-10-CM | POA: Diagnosis not present

## 2024-08-07 DIAGNOSIS — I639 Cerebral infarction, unspecified: Secondary | ICD-10-CM

## 2024-08-17 ENCOUNTER — Other Ambulatory Visit (HOSPITAL_COMMUNITY): Payer: Self-pay

## 2024-08-17 ENCOUNTER — Other Ambulatory Visit: Payer: Self-pay | Admitting: Medical

## 2024-08-17 ENCOUNTER — Other Ambulatory Visit: Payer: Self-pay

## 2024-08-17 MED ORDER — SERTRALINE HCL 25 MG PO TABS
25.0000 mg | ORAL_TABLET | Freq: Every day | ORAL | 0 refills | Status: DC
  Filled 2024-08-17 – 2024-08-18 (×2): qty 30, 30d supply, fill #0
  Filled 2024-09-01: qty 20, 20d supply, fill #0

## 2024-08-18 ENCOUNTER — Other Ambulatory Visit: Payer: Self-pay

## 2024-08-18 ENCOUNTER — Other Ambulatory Visit (HOSPITAL_COMMUNITY): Payer: Self-pay

## 2024-08-19 ENCOUNTER — Other Ambulatory Visit: Payer: Self-pay

## 2024-08-20 ENCOUNTER — Other Ambulatory Visit: Payer: Self-pay

## 2024-08-21 ENCOUNTER — Other Ambulatory Visit (HOSPITAL_COMMUNITY): Payer: Self-pay

## 2024-08-24 ENCOUNTER — Encounter: Payer: Self-pay | Admitting: Cardiovascular Disease

## 2024-08-24 ENCOUNTER — Ambulatory Visit: Attending: Cardiovascular Disease | Admitting: Cardiovascular Disease

## 2024-08-24 VITALS — BP 88/58 | HR 93 | Ht 76.0 in | Wt 290.6 lb

## 2024-08-24 DIAGNOSIS — E7800 Pure hypercholesterolemia, unspecified: Secondary | ICD-10-CM | POA: Diagnosis not present

## 2024-08-24 DIAGNOSIS — I5032 Chronic diastolic (congestive) heart failure: Secondary | ICD-10-CM

## 2024-08-24 DIAGNOSIS — I2510 Atherosclerotic heart disease of native coronary artery without angina pectoris: Secondary | ICD-10-CM

## 2024-08-24 DIAGNOSIS — R55 Syncope and collapse: Secondary | ICD-10-CM

## 2024-08-24 DIAGNOSIS — I480 Paroxysmal atrial fibrillation: Secondary | ICD-10-CM

## 2024-08-24 DIAGNOSIS — I10 Essential (primary) hypertension: Secondary | ICD-10-CM

## 2024-08-24 DIAGNOSIS — I350 Nonrheumatic aortic (valve) stenosis: Secondary | ICD-10-CM | POA: Diagnosis not present

## 2024-08-24 NOTE — Progress Notes (Signed)
 Chief Complaint  Patient presents with   Follow-up    CAD    History of Present Illness: 78 yo male with history of CAD s/p CABG, post-operative atrial fibrillation, obesity, HTN, HLD, aortic stenosis, chronic diastolic CHF, CVA and sleep apnea who is here today for follow up. I saw him as a new consult for the evaluation of dyspnea in September 2023. Echo September 2020 with normal LV function and no valvular disease. At his visit here in September 2023 he described progressive dyspnea and fatigue but no chest pain. He has sleep apnea but does not tolerate CPAP. Echo September 2023 with LVEF=60-65%, moderate LVH. Mild aortic stenosis with mean gradient of 12 mmHg. Coronary CTA 07/24/22 with severe disease in the LAD and RCA. Cardiac catheterization October 2023 with severe left main and three vessel CAD. He underwent 3V CABG in October 2023. (LIMA to LAD, SVG to PDA, SVG to posterolateral branch).  Post-op course complicated by atrial fibrillation with RVR  that persisted despite cardizem  and digoxin . He underwent cardioversion on 08/08/2022. Echo prior to discharge in November 2023 with LVEF=60-65%. He was seen in atrial fib clinic following discharge and was continued on Eliquis  and amiodarone . Cardiac monitor March 2024 with no evidence of atrial fib. Amiodarone  and Eliquis  were stopped. He was hospitalized in January 2025 with Covid infection and acute kidney injury. He was seen in our office in July 2025 with LE edema. He was started on aldactone  and had good diuresis. Echo August 2025 with LVEF=50-55%. Severe LVH. Moderate aortic stenosis. He was admitted to [facility] with a stroke 06/27/24. Echo 06/28/24 with normal LV function and moderate aortic stenosis. His stroke was felt to possibly be cardioembolic and he was started back on Eliquis . Cardiac monitor in October 2025 with no evidence of atrial fibrillation.   He is here today for a routine cardiac follow up. He comes in today with his sister who  tells me that Mr. Marban passed out twice yesterday. He has been feeling tired and weak lately. He was sitting playing video poker yesterday and woke up on the floor. He rested for a bit then went back to playing the game and woke up on the floor again. He feels ok today. He has no dizziness at baseline. He had no weakness in his arms or legs after passing out yesterday. He is not interested in seeking an inpatient evaluation. He denies chest pain, dyspnea, palpitations, lower extremity edema, orthopnea, PND. He has lost 16 lbs over the last 3 weeks on aldactone .    Primary Care Physician: Dorina Loving, PA-C   Past Medical History:  Diagnosis Date   Depression    GERD (gastroesophageal reflux disease)    Hx of adenomatous colonic polyps 09/04/2015   Hyperlipidemia    Hypertension    LBP (low back pain)    OSA (obstructive sleep apnea) 08/01/2018   Sleep apnea    wears cpap     Past Surgical History:  Procedure Laterality Date   CARDIOVERSION N/A 08/08/2022   Procedure: CARDIOVERSION;  Surgeon: Raford Riggs, MD;  Location: [facility] ENDOSCOPY;  Service: Cardiovascular;  Laterality: N/A;   COLONOSCOPY  2004, 2021   hyperplastic polyps x 2   CORONARY ARTERY BYPASS GRAFT N/A 08/03/2022   Procedure: CORONARY ARTERY BYPASS GRAFTING (CABG) 3X, USING RIGHT GREATER SAPHENOUS VEIN AND LEFT INTERNAL MAMMARY ARTERY.;  Surgeon: Lucas Dorise POUR, MD;  Location: [facility] OR;  Service: Open Heart Surgery;  Laterality: N/A;   KNEE ARTHROSCOPY Right  LEFT HEART CATH AND CORONARY ANGIOGRAPHY N/A 08/01/2022   Procedure: LEFT HEART CATH AND CORONARY ANGIOGRAPHY;  Surgeon: Verlin Lonni BIRCH, MD;  Location: [facility] INVASIVE CV LAB;  Service: Cardiovascular;  Laterality: N/A;   PENILE PROSTHESIS IMPLANT N/A 01/25/2020   Procedure: PENILE PROTHESIS INFLATABLE COLOPLAST;  Surgeon: Carolee Sherwood BIRCH DOUGLAS, MD;  Location: [facility];  Service: Urology;  Laterality: N/A;   SHOULDER ARTHROSCOPY W/ ROTATOR  CUFF REPAIR Left    TEE WITHOUT CARDIOVERSION N/A 08/03/2022   Procedure: TRANSESOPHAGEAL ECHOCARDIOGRAM (TEE);  Surgeon: Lucas Dorise POUR, MD;  Location: [facility] OR;  Service: Open Heart Surgery;  Laterality: N/A;   TEE WITHOUT CARDIOVERSION N/A 08/08/2022   Procedure: TRANSESOPHAGEAL ECHOCARDIOGRAM (TEE);  Surgeon: Raford Riggs, MD;  Location: [facility] ENDOSCOPY;  Service: Cardiovascular;  Laterality: N/A;   TONSILLECTOMY  as child   vocal cord growth removed  age 83   benign   WISDOM TOOTH EXTRACTION      Current Outpatient Medications  Medication Sig Dispense Refill   alfuzosin  (UROXATRAL ) 10 MG 24 hr tablet Take 1 tablet (10 mg total) by mouth daily with breakfast. 30 tablet 11   apixaban  (ELIQUIS ) 5 MG TABS tablet Take 1 tablet (5 mg total) by mouth 2 (two) times daily. 60 tablet 1   atorvastatin  (LIPITOR ) 80 MG tablet Take 1 tablet (80 mg total) by mouth daily with breakfast. 90 tablet 3   benazepril  (LOTENSIN ) 20 MG tablet Take 1 tablet (20 mg total) by mouth daily with supper. 90 tablet 3   diltiazem  (CARDIZEM  CD) 180 MG 24 hr capsule Take 1 capsule (180 mg total) by mouth every morning. 90 capsule 3   fluticasone -salmeterol (WIXELA INHUB) 100-50 MCG/ACT AEPB Inhale 1 puff into the lungs 2 (two) times daily. 180 each 12   pantoprazole  (PROTONIX ) 40 MG tablet Take 1 tablet (40 mg total) by mouth every evening. Please make appointment for refills 90 tablet 3   sertraline (ZOLOFT) 25 MG tablet Take 1 tablet (25 mg total) by mouth daily. 30 tablet 0   spironolactone  (ALDACTONE ) 25 MG tablet Take 1 tablet (25 mg total) by mouth every morning. 90 tablet 3   No current facility-administered medications for this visit.    Allergies  Allergen Reactions   Hctz [Hydrochlorothiazide] Shortness Of Breath   Enalapril Maleate Other (See Comments)    cramps   Tribenzor [Olmesartan -Amlodipine -Hctz] Swelling    Fatigue, CP, edema   Verapamil  Other (See Comments)    Fatigue    Social History    Socioeconomic History   Marital status: Divorced    Spouse name: Not on file   Number of children: 1   Years of education: Not on file   Highest education level: Not on file  Occupational History   Occupation: Retired   Occupation: Retired-mailman  Tobacco Use   Smoking status: Never   Smokeless tobacco: Never   Tobacco comments:    Never smoke 08/22/22  Vaping Use   Vaping status: Never Used  Substance and Sexual Activity   Alcohol use: Not Currently    Alcohol/week: 4.0 standard drinks of alcohol    Types: 4 Cans of beer per week    Comment: stop drinking 08/22/22   Drug use: No   Sexual activity: Not Currently  Other Topics Concern   Not on file  Social History Narrative   Regular exercise- yes, seldom; working physically all the time.   Social Drivers of Health   Financial Resource Strain: Low Risk  (03/06/2022)  Overall Financial Resource Strain (CARDIA)    Difficulty of Paying Living Expenses: Not hard at all  Food Insecurity: No Food Insecurity (06/30/2024)   Hunger Vital Sign    Worried About Running Out of Food in the Last Year: Never true    Ran Out of Food in the Last Year: Never true  Transportation Needs: No Transportation Needs (06/30/2024)   PRAPARE - Administrator, Civil Service (Medical): No    Lack of Transportation (Non-Medical): No  Physical Activity: Inactive (03/06/2022)   Exercise Vital Sign    Days of Exercise per Week: 0 days    Minutes of Exercise per Session: 0 min  Stress: No Stress Concern Present (03/06/2022)   [employer] of Occupational Health - Occupational Stress Questionnaire    Feeling of Stress : Not at all  Social Connections: Moderately Isolated (06/28/2024)   Social Connection and Isolation Panel    Frequency of Communication with Friends and Family: More than three times a week    Frequency of Social Gatherings with Friends and Family: More than three times a week    Attends Religious Services: Never     Database Administrator or Organizations: Yes    Attends Engineer, Structural: More than 4 times per year    Marital Status: Divorced  Intimate Partner Violence: Not At Risk (06/30/2024)   Humiliation, Afraid, Rape, and Kick questionnaire    Fear of Current or Ex-Partner: No    Emotionally Abused: No    Physically Abused: No    Sexually Abused: No    Family History  Problem Relation Age of Onset   Stroke Mother 36   Depression Mother    Hypertension Father    Heart disease Father    Colon polyps Father    Pancreatic cancer Brother    Colon polyps Brother    Throat cancer Brother    Breast cancer Daughter    Hypertension Other    Colon cancer Neg Hx    Esophageal cancer Neg Hx    Stomach cancer Neg Hx    Rectal cancer Neg Hx     Review of Systems:  As stated in the HPI and otherwise negative.   BP (!) 88/58   Pulse 93   Ht 6' 4 (1.93 m)   Wt 290 lb 9.6 oz (131.8 kg)   SpO2 97%   BMI 35.37 kg/m   Physical Examination: General: Well developed, well nourished, NAD  HEENT: OP clear, mucus membranes moist  SKIN: warm, dry. No rashes. Neuro: No focal deficits  Musculoskeletal: Muscle strength 5/5 all ext  Psychiatric: Mood and affect normal  Neck: No JVD, no carotid bruits, no thyromegaly, no lymphadenopathy.  Lungs:Clear bilaterally, no wheezes, rhonci, crackles Cardiovascular: Regular rate and rhythm. Systolic murmur.  Abdomen:Soft. Bowel sounds present. Non-tender.  Extremities: No lower extremity edema. Pulses are 2 + in the bilateral DP/PT.  EKG:  EKG is ordered today. The ekg ordered today demonstrates EKG Interpretation Date/Time:  Monday August 24 2024 15:08:58 EST Ventricular Rate:  85 PR Interval:  202 QRS Duration:  124 QT Interval:  368 QTC Calculation: 437 R Axis:   -42  Text Interpretation: Normal sinus rhythm Left axis deviation Left ventricular hypertrophy with QRS widening and repolarization abnormality ( R in aVL , Cornell product )  Confirmed by Verlin Bruckner [phone]) on 08/24/2024 3:11:15 PM    Recent Labs: 07/02/2024: Hemoglobin 14.3; Platelets 195.0; Pro B Natriuretic peptide (BNP) 43.0 07/06/2024: ALT 32;  BUN 11; Creatinine, Ser 0.84; Potassium 3.8; Sodium 137   Lipid Panel    Component Value Date/Time   CHOL 76 06/28/2024 0450   CHOL 170 06/16/2019 1035   TRIG 52 06/28/2024 0450   HDL 29 (L) 06/28/2024 0450   HDL 32 (L) 06/16/2019 1035   CHOLHDL 2.6 06/28/2024 0450   VLDL 10 06/28/2024 0450   LDLCALC 37 06/28/2024 0450   LDLCALC 121 (H) 06/16/2019 1035   LDLDIRECT 158.5 10/19/2008 0858     Wt Readings from Last 3 Encounters:  08/24/24 290 lb 9.6 oz (131.8 kg)  08/03/24 (!) 306 lb 3.2 oz (138.9 kg)  07/27/24 300 lb (136.1 kg)    Assessment and Plan:   1. CAD s/p CABG without angina: No chest pain suggestive of angina. Continue statin.  He is off of ASA since he is on Eliquis .   2. HTN: BP is low today. Given his syncope, will stop Benazepril  and aldactone .    3. Hyperlipidemia: LDL 37 in September 2025. Continue statin.   4. Atrial fibrillation, paroxysmal: Sinus on exam today. No evidence of atrial fib on his cardiac monitor. With possible embolic stroke in September 2025, will continue Eliquis . Continue Cardizem .  5. Aortic stenosis: Moderate by echo in by echo in September 2025. Repeat echo in one year.   6. Chronic diastolic CHF: Wt is down 16 lbs in the last three weeks. No LE edema. Given his fatigue and weakness/soft BP, will hold aldactone .    7. Syncope: Unclear etiology of syncope yesterday. His BP is soft today. This could be orthostatic hypotension. I will stop his Benazepril  and aldactone . He will follow his BP at home. He will push oral hydration over the next several day.  We discussed that an inpatient workup would be appropriate but he refuses admission. He will call EMS if he becomes dizzy or has further syncopal events. His sister lives nearby and will check on him  frequently.  BMET and CBC today.  No need for another cardiac monitor as he just completed a monitor two weeks ago May need to consider a loop recorder with further syncopal events He is told not to drive for 6 months post syncope  Labs/ tests ordered today include:   Orders Placed This Encounter  Procedures   CBC   Basic Metabolic Panel (BMET)   EKG 12-Lead   Disposition:   F/U with me in 12 months  Signed, Lonni Cash, MD 08/24/2024 4:34 PM    [facility] [address] [place], [place], [place]  [address] Phone: [phone]; Fax: [phone]

## 2024-08-24 NOTE — Patient Instructions (Addendum)
 Medication Instructions:  Your physician recommends that you continue on your current medications as directed. Please refer to the Current Medication list given to you today.  *If you need a refill on your cardiac medications before your next appointment, please call your pharmacy*  Lab Work: Have lab work done in the lab on the first floor today--CBC and BMP If you have labs (blood work) drawn today and your tests are completely normal, you will receive your results only by: MyChart Message (if you have MyChart) OR A paper copy in the mail If you have any lab test that is abnormal or we need to change your treatment, we will call you to review the results.  Testing/Procedures: none  Follow-Up: At [facility], you and your health needs are our priority.  As part of our continuing mission to provide you with exceptional heart care, our providers are all part of one team.  This team includes your primary Cardiologist (physician) and Advanced Practice Providers or APPs (Physician Assistants and Nurse Practitioners) who all work together to provide you with the care you need, when you need it.  Your next appointment:   4-6 week(s)  Provider:   One of our Advanced Practice Providers (APPs): Morse Clause, PA-C  Lamarr Satterfield, NP Miriam Shams, NP  Olivia Pavy, PA-C Josefa Beauvais, NP  Leontine Salen, PA-C Orren Fabry, PA-C  Hao Meng, PA-C Ernest Dick, NP  Damien Braver, NP Jon Hails, PA-C  Waddell Donath, PA-C    Dayna Dunn, PA-C  Scott Weaver, PA-C Lum Louis, NP Katlyn West, NP Callie Goodrich, PA-C  Xika Zhao, NP Sheng Haley, PA-C    Kathleen Johnson, PA-C       We recommend signing up for the patient portal called MyChart.  Sign up information is provided on this After Visit Summary.  MyChart is used to connect with patients for Virtual Visits (Telemedicine).  Patients are able to view lab/test results, encounter notes, upcoming appointments, etc.  Non-urgent  messages can be sent to your provider as well.   To learn more about what you can do with MyChart, go to [email or web address].   Other Instructions  Hold Benazapril and spironolactone 

## 2024-08-25 ENCOUNTER — Ambulatory Visit: Payer: Self-pay | Admitting: Cardiovascular Disease

## 2024-08-25 ENCOUNTER — Other Ambulatory Visit: Payer: Self-pay

## 2024-08-25 DIAGNOSIS — I10 Essential (primary) hypertension: Secondary | ICD-10-CM

## 2024-08-25 DIAGNOSIS — R55 Syncope and collapse: Secondary | ICD-10-CM

## 2024-08-25 LAB — BASIC METABOLIC PANEL WITH GFR
BUN/Creatinine Ratio: 17 (ref 10–24)
BUN: 28 mg/dL — AB (ref 8–27)
CO2: 19 mmol/L — AB (ref 20–29)
Calcium: 9.6 mg/dL (ref 8.6–10.2)
Chloride: 96 mmol/L (ref 96–106)
Creatinine, Ser: 1.63 mg/dL — AB (ref 0.76–1.27)
Glucose: 105 mg/dL — AB (ref 70–99)
Potassium: 4.5 mmol/L (ref 3.5–5.2)
Sodium: 133 mmol/L — AB (ref 134–144)
eGFR: 43 mL/min/1.73 — AB (ref >59)

## 2024-08-25 LAB — CBC
Hematocrit: 38 % (ref 37.5–51.0)
Hemoglobin: 13 g/dL (ref 13.0–17.7)
MCH: 34.4 pg — ABNORMAL HIGH (ref 26.6–33.0)
MCHC: 34.2 g/dL (ref 31.5–35.7)
MCV: 101 fL — ABNORMAL HIGH (ref 79–97)
Platelets: 356 x10E3/uL (ref 150–450)
RBC: 3.78 x10E6/uL — ABNORMAL LOW (ref 4.14–5.80)
RDW: 13 % (ref 11.6–15.4)
WBC: 8.8 x10E3/uL (ref 3.4–10.8)

## 2024-09-01 ENCOUNTER — Other Ambulatory Visit: Payer: Self-pay | Admitting: Family

## 2024-09-01 ENCOUNTER — Other Ambulatory Visit: Payer: Self-pay

## 2024-09-01 DIAGNOSIS — K219 Gastro-esophageal reflux disease without esophagitis: Secondary | ICD-10-CM

## 2024-09-01 MED ORDER — PANTOPRAZOLE SODIUM 40 MG PO TBEC
40.0000 mg | DELAYED_RELEASE_TABLET | Freq: Every evening | ORAL | 0 refills | Status: AC
  Filled 2024-09-11: qty 30, 30d supply, fill #0
  Filled 2024-10-14: qty 30, 30d supply, fill #1

## 2024-09-02 ENCOUNTER — Other Ambulatory Visit: Payer: Self-pay

## 2024-09-04 ENCOUNTER — Other Ambulatory Visit: Payer: Self-pay

## 2024-09-11 ENCOUNTER — Other Ambulatory Visit: Payer: Self-pay | Admitting: *Deleted

## 2024-09-11 ENCOUNTER — Other Ambulatory Visit: Payer: Self-pay

## 2024-09-11 MED ORDER — APIXABAN 5 MG PO TABS
5.0000 mg | ORAL_TABLET | Freq: Two times a day (BID) | ORAL | 3 refills | Status: AC
  Filled 2024-09-11: qty 60, 30d supply, fill #0
  Filled 2024-10-14: qty 60, 30d supply, fill #1

## 2024-09-13 MED ORDER — SERTRALINE HCL 25 MG PO TABS
25.0000 mg | ORAL_TABLET | Freq: Every day | ORAL | 3 refills | Status: DC
  Filled 2024-09-13 – 2024-09-16 (×2): qty 90, 90d supply, fill #0

## 2024-09-13 NOTE — Addendum Note (Signed)
 Addended by: DORINA DALLAS HERO on: 09/13/2024 07:28 AM   Modules accepted: Orders

## 2024-09-14 ENCOUNTER — Telehealth: Payer: Self-pay

## 2024-09-14 ENCOUNTER — Other Ambulatory Visit (HOSPITAL_BASED_OUTPATIENT_CLINIC_OR_DEPARTMENT_OTHER): Payer: Self-pay

## 2024-09-14 ENCOUNTER — Other Ambulatory Visit: Payer: Self-pay

## 2024-09-14 ENCOUNTER — Other Ambulatory Visit (HOSPITAL_COMMUNITY): Payer: Self-pay

## 2024-09-14 NOTE — Telephone Encounter (Signed)
 [facility] forms faxed with confirmation recieved

## 2024-09-16 ENCOUNTER — Other Ambulatory Visit (HOSPITAL_BASED_OUTPATIENT_CLINIC_OR_DEPARTMENT_OTHER): Payer: Self-pay

## 2024-09-17 ENCOUNTER — Telehealth: Payer: Self-pay

## 2024-09-17 NOTE — Patient Outreach (Signed)
 Incoming telephone call from patient's sister, returning call to obtain mRS. Successfully completed. MRS= 3  Shereen Gin [facility] VBCI Assistant Direct Dial: [phone]  Fax: [phone] Website: [email or web address]

## 2024-09-17 NOTE — Patient Outreach (Signed)
 First telephone outreach attempt to obtain mRS. No answer. Left message for returned call.  [facility]  Population Health Care Management Assistant  Direct Dial: [phone]  Fax: [phone] Website: [email or web address]

## 2024-09-20 ENCOUNTER — Ambulatory Visit (HOSPITAL_BASED_OUTPATIENT_CLINIC_OR_DEPARTMENT_OTHER): Admitting: Pulmonary Disease

## 2024-09-24 ENCOUNTER — Telehealth: Payer: Self-pay

## 2024-09-24 NOTE — Telephone Encounter (Signed)
 Called sister and notified her that this apt was a TOC and that it was noted per pcp that this apt was not needed due to him already being transferred. But did advise her that he was supposed to fu with edward in November and didn't make an appointment. She was understanding and was able to schedule pt for 10/13/2024    Copied from CRM #[id number]. Topic: Appointments - Scheduling Inquiry for Clinic >> Sep 24, 2024 11:57 AM [name] wrote: Reason for CRM: [name] is calling in and requesting a call back at [phone] as to why app for 10/13/2024 has been canceled. Will be available until 2:30 pm today.

## 2024-09-28 ENCOUNTER — Other Ambulatory Visit (HOSPITAL_BASED_OUTPATIENT_CLINIC_OR_DEPARTMENT_OTHER): Payer: Self-pay

## 2024-10-05 LAB — BASIC METABOLIC PANEL WITH GFR
BUN/Creatinine Ratio: 10 (ref 10–24)
BUN: 8 mg/dL (ref 8–27)
CO2: 21 mmol/L (ref 20–29)
Calcium: 9 mg/dL (ref 8.6–10.2)
Chloride: 105 mmol/L (ref 96–106)
Creatinine, Ser: 0.82 mg/dL (ref 0.76–1.27)
Glucose: 119 mg/dL — ABNORMAL HIGH (ref 70–99)
Potassium: 3.8 mmol/L (ref 3.5–5.2)
Sodium: 143 mmol/L (ref 134–144)
eGFR: 91 mL/min/1.73

## 2024-10-07 ENCOUNTER — Ambulatory Visit: Attending: Internal Medicine | Admitting: Emergency Medicine

## 2024-10-07 ENCOUNTER — Encounter: Payer: Self-pay | Admitting: Nurse Practitioner

## 2024-10-07 ENCOUNTER — Other Ambulatory Visit: Payer: Self-pay

## 2024-10-07 ENCOUNTER — Other Ambulatory Visit (HOSPITAL_COMMUNITY): Payer: Self-pay

## 2024-10-07 VITALS — BP 156/74 | HR 86 | Ht 76.0 in | Wt 305.0 lb

## 2024-10-07 DIAGNOSIS — R600 Localized edema: Secondary | ICD-10-CM

## 2024-10-07 DIAGNOSIS — I10 Essential (primary) hypertension: Secondary | ICD-10-CM

## 2024-10-07 DIAGNOSIS — I480 Paroxysmal atrial fibrillation: Secondary | ICD-10-CM | POA: Diagnosis not present

## 2024-10-07 MED ORDER — SPIRONOLACTONE 25 MG PO TABS
12.5000 mg | ORAL_TABLET | Freq: Every day | ORAL | 3 refills | Status: AC
  Filled 2024-10-07: qty 45, 90d supply, fill #0

## 2024-10-07 NOTE — Patient Instructions (Signed)
 Medication Instructions:  Restart Spirolactone 12.5 mg daily   *If you need a refill on your cardiac medications before your next appointment, please call your pharmacy*  Lab Work: BMET in 1-2 weeks   Testing/Procedures: NONE ordered at this time of appointment   Follow-Up: At [facility], you and your health needs are our priority.  As part of our continuing mission to provide you with exceptional heart care, our providers are all part of one team.  This team includes your primary Cardiologist (physician) and Advanced Practice Providers or APPs (Physician Assistants and Nurse Practitioners) who all work together to provide you with the care you need, when you need it.  Your next appointment:   3 week(s)  Provider:   Miriam Shams, NP          We recommend signing up for the patient portal called MyChart.  Sign up information is provided on this After Visit Summary.  MyChart is used to connect with patients for Virtual Visits (Telemedicine).  Patients are able to view lab/test results, encounter notes, upcoming appointments, etc.  Non-urgent messages can be sent to your provider as well.   To learn more about what you can do with MyChart, go to [email or web address].

## 2024-10-07 NOTE — Progress Notes (Signed)
 " Cardiology Office Note:    Date:  10/07/2024   ID:  Hector Edwards, DOB 1945-10-24, MRN [id number]  PCP:  Hector Edwards    [facility] Providers Cardiologist:  Lonni Cash, MD     Referring MD: Hector Dallas, PA-C   Chief complaint: Syncope, hypotension     History of Present Illness:   Hector Edwards is a 78 y.o. male with a hx of CAD s/p CABG, post-operative atrial fibrillation, obesity, HTN, HLD, aortic stenosis, chronic diastolic CHF, CVA and sleep apnea who is here today for 1 month follow-up of recent syncopal events.  Presented to cardiology in September 2023 for the evaluation of dyspnea.  Echo September 2020 with normal LV function and no valvular disease. At his visit here in September 2023 he described progressive dyspnea and fatigue but no chest pain. He has sleep apnea but does not tolerate CPAP. Echo September 2023 with LVEF=60-65%, moderate LVH. Mild aortic stenosis with mean gradient of 12 mmHg. Coronary CTA 07/24/22 with severe disease in the LAD and RCA. Cardiac catheterization October 2023 with severe left main and three vessel CAD. He underwent 3V CABG in October 2023. (LIMA to LAD, SVG to PDA, SVG to posterolateral branch).  Post-op course complicated by atrial fibrillation with RVR  that persisted despite cardizem  and digoxin . He underwent cardioversion on 08/08/2022. Echo prior to discharge in November 2023 with LVEF=60-65%. He was seen in atrial fib clinic following discharge and was continued on Eliquis  and amiodarone . Cardiac monitor March 2024 with no evidence of atrial fib. Amiodarone  and Eliquis  were stopped. He was hospitalized in January 2025 with Covid infection and acute kidney injury. He was seen in our office in July 2025 with LE edema. He was started on aldactone  and had good diuresis. Echo August 2025 with LVEF=50-55%. Severe LVH. Moderate aortic stenosis. He was admitted to [facility] with a stroke 06/27/24. Echo 06/28/24 with normal  LV function and moderate aortic stenosis. His stroke was felt to possibly be cardioembolic and he was started back on Eliquis . Cardiac monitor in October 2025 with no evidence of atrial fibrillation.   Most recent outpatient follow-up was 08/24/2024 with Dr. Lonni Cash.  At that time he had reported he passed out twice the day before while sitting playing video poker, woke up on the floor.  Was not interested in seeking inpatient evaluation at that time.  He had lost 16 pounds over the last 3 weeks on Aldactone .  Blood pressure in clinic 88/58, benazepril  and Aldactone  stopped.  Presents with his sister, stable from a cardiac standpoint. He denies chest pain, palpitations, dyspnea, orthopnea, n, v,  dark/tarry/bloody stools, hematuria, dizziness, syncope. Since altering his medications, he has noticed significant weight gain (15 lbs) and increased leg swelling. All other symptoms he was previously feeling while on the medication have resolved. BP's have remained elevated since cutting back on his benazepril .   ROS:   Please see the history of present illness.    All other systems reviewed and are negative.     Past Medical History:  Diagnosis Date   Depression    GERD (gastroesophageal reflux disease)    Hx of adenomatous colonic polyps 09/04/2015   Hyperlipidemia    Hypertension    LBP (low back pain)    OSA (obstructive sleep apnea) 08/01/2018   Sleep apnea    wears cpap     Past Surgical History:  Procedure Laterality Date   CARDIOVERSION N/A 08/08/2022   Procedure: CARDIOVERSION;  Surgeon:  Hector Riggs, MD;  Location: [facility] ENDOSCOPY;  Service: Cardiovascular;  Laterality: N/A;   COLONOSCOPY  2004, 2021   hyperplastic polyps x 2   CORONARY ARTERY BYPASS GRAFT N/A 08/03/2022   Procedure: CORONARY ARTERY BYPASS GRAFTING (CABG) 3X, USING RIGHT GREATER SAPHENOUS VEIN AND LEFT INTERNAL MAMMARY ARTERY.;  Surgeon: Hector Dorise POUR, MD;  Location: [facility] OR;  Service: Open Heart  Surgery;  Laterality: N/A;   KNEE ARTHROSCOPY Right    LEFT HEART CATH AND CORONARY ANGIOGRAPHY N/A 08/01/2022   Procedure: LEFT HEART CATH AND CORONARY ANGIOGRAPHY;  Surgeon: Verlin Lonni BIRCH, MD;  Location: [facility] INVASIVE CV LAB;  Service: Cardiovascular;  Laterality: N/A;   PENILE PROSTHESIS IMPLANT N/A 01/25/2020   Procedure: PENILE PROTHESIS INFLATABLE COLOPLAST;  Surgeon: Carolee Sherwood BIRCH DOUGLAS, MD;  Location: [facility] [place];  Service: Urology;  Laterality: N/A;   SHOULDER ARTHROSCOPY W/ ROTATOR CUFF REPAIR Left    TEE WITHOUT CARDIOVERSION N/A 08/03/2022   Procedure: TRANSESOPHAGEAL ECHOCARDIOGRAM (TEE);  Surgeon: Hector Dorise POUR, MD;  Location: [facility] OR;  Service: Open Heart Surgery;  Laterality: N/A;   TEE WITHOUT CARDIOVERSION N/A 08/08/2022   Procedure: TRANSESOPHAGEAL ECHOCARDIOGRAM (TEE);  Surgeon: Hector Riggs, MD;  Location: [facility] ENDOSCOPY;  Service: Cardiovascular;  Laterality: N/A;   TONSILLECTOMY  as child   vocal cord growth removed  age 20   benign   WISDOM TOOTH EXTRACTION      Current Medications: Active Medications[1]   Allergies:   Hctz [hydrochlorothiazide], Enalapril maleate, Tribenzor [olmesartan -amlodipine -hctz], and Verapamil    Social History   Socioeconomic History   Marital status: Divorced    Spouse name: Not on file   Number of children: 1   Years of education: Not on file   Highest education level: Not on file  Occupational History   Occupation: Retired   Occupation: Retired-mailman  Tobacco Use   Smoking status: Never   Smokeless tobacco: Never   Tobacco comments:    Never smoke 08/22/22  Vaping Use   Vaping status: Never Used  Substance and Sexual Activity   Alcohol use: Not Currently    Alcohol/week: 4.0 standard drinks of alcohol    Types: 4 Cans of beer per week    Comment: stop drinking 08/22/22   Drug use: No   Sexual activity: Not Currently  Other Topics Concern   Not on file  Social History Narrative   Regular  exercise- yes, seldom; working physically all the time.   Social Drivers of Health   Tobacco Use: Low Risk (10/07/2024)   Patient History    Smoking Tobacco Use: Never    Smokeless Tobacco Use: Never    Passive Exposure: Not on file  Financial Resource Strain: Low Risk (03/06/2022)   Overall Financial Resource Strain (CARDIA)    Difficulty of Paying Living Expenses: Not hard at all  Food Insecurity: No Food Insecurity (06/30/2024)   Epic    Worried About Radiation Protection Practitioner of Food in the Last Year: Never true    Ran Out of Food in the Last Year: Never true  Transportation Needs: No Transportation Needs (06/30/2024)   Epic    Lack of Transportation (Medical): No    Lack of Transportation (Non-Medical): No  Physical Activity: Inactive (03/06/2022)   Exercise Vital Sign    Days of Exercise per Week: 0 days    Minutes of Exercise per Session: 0 min  Stress: No Stress Concern Present (03/06/2022)   [employer] of Occupational Health - Occupational Stress Questionnaire  Feeling of Stress : Not at all  Social Connections: Moderately Isolated (06/28/2024)   Social Connection and Isolation Panel    Frequency of Communication with Friends and Family: More than three times a week    Frequency of Social Gatherings with Friends and Family: More than three times a week    Attends Religious Services: Never    Database Administrator or Organizations: Yes    Attends Banker Meetings: More than 4 times per year    Marital Status: Divorced  Depression (PHQ2-9): Medium Risk (07/02/2024)   Depression (PHQ2-9)    PHQ-2 Score: 8  Alcohol Screen: Low Risk (03/06/2022)   Alcohol Screen    Last Alcohol Screening Score (AUDIT): 1  Housing: Unknown (06/30/2024)   Epic    Unable to Pay for Housing in the Last Year: No    Number of Times Moved in the Last Year: Not on file    Homeless in the Last Year: No  Recent Concern: Housing - High Risk (06/28/2024)   Epic    Unable to Pay for Housing in  the Last Year: Yes    Number of Times Moved in the Last Year: 0    Homeless in the Last Year: Yes  Utilities: Not At Risk (06/30/2024)   Epic    Threatened with loss of utilities: No  Health Literacy: Not on file     Family History: The patient's family history includes Breast cancer in his daughter; Colon polyps in his brother and father; Depression in his mother; Heart disease in his father; Hypertension in his father and another family member; Pancreatic cancer in his brother; Stroke (age of onset: 4) in his mother; Throat cancer in his brother. There is no history of Colon cancer, Esophageal cancer, Stomach cancer, or Rectal cancer.  EKGs/Labs/Other Studies Reviewed:    The following studies were reviewed today:  EKG Interpretation Date/Time:  Wednesday October 07 2024 09:52:59 EST Ventricular Rate:  86 PR Interval:  202 QRS Duration:  122 QT Interval:  384 QTC Calculation: 459 R Axis:   -41  Text Interpretation: Normal sinus rhythm Left axis deviation Left ventricular hypertrophy with QRS widening and repolarization abnormality ( R in aVL , Cornell product ) When compared with ECG of 24-Aug-2024 15:08, No significant change was found Confirmed by Jhania Etherington [phone]) on 10/07/2024 10:10:00 AM    Recent Labs: 07/02/2024: Pro B Natriuretic peptide (BNP) 43.0 07/06/2024: ALT 32 08/24/2024: Hemoglobin 13.0; Platelets 356 10/05/2024: BUN 8; Creatinine, Ser 0.82; Potassium 3.8; Sodium 143  Recent Lipid Panel    Component Value Date/Time   CHOL 76 06/28/2024 0450   CHOL 170 06/16/2019 1035   TRIG 52 06/28/2024 0450   HDL 29 (L) 06/28/2024 0450   HDL 32 (L) 06/16/2019 1035   CHOLHDL 2.6 06/28/2024 0450   VLDL 10 06/28/2024 0450   LDLCALC 37 06/28/2024 0450   LDLCALC 121 (H) 06/16/2019 1035   LDLDIRECT 158.5 10/19/2008 0858         Physical Exam:    VS:  BP (!) 156/74 (BP Location: Left Arm, Patient Position: Sitting, Cuff Size: Large)   Pulse 86   Ht 6' 4 (1.93  m)   Wt (!) 305 lb (138.3 kg)   SpO2 97%   BMI 37.13 kg/m        Wt Readings from Last 3 Encounters:  10/07/24 (!) 305 lb (138.3 kg)  08/24/24 290 lb 9.6 oz (131.8 kg)  08/03/24 (!) 306 lb 3.2 oz (  138.9 kg)     GEN:  Well nourished, well developed in no acute distress HEENT: Normal NECK: No JVD; No carotid bruits CARDIAC:  S1-S2 normal, RRR, no murmurs, rubs, gallops RESPIRATORY:  Clear to auscultation without rales, wheezing or rhonchi  MUSCULOSKELETAL:  1+ pitting edema bilaterally; No deformity  SKIN: Warm and dry NEUROLOGIC:  Alert and oriented x 3 PSYCHIATRIC:  Normal affect       Assessment & Plan Bilateral lower extremity edema Bilateral LE edema 1+ Denies SOB, PND, orthopnea Lungs clear, no JVD No further near syncopal episodes/dizziness Will plan to add spironolactone  back at half dose to attempt to help with diuresis/BP Plan to get repeat BMP in 1-2 weeks and have return soon for close follow up ED precautions discussed, call office if symptoms change Start spironolactone  at 12.5 mg daily Primary hypertension Hypotension from previous visit improved.  Now hypertensive Will add back half dose spironolactone  as above Monitor BPs and bring to next appointment PAF (paroxysmal atrial fibrillation) (HCC) EKG: NSR, 86 bpm, as above Has not missed any doses of eliquis  Denies bleeding/bruising Continue eliquis  5 mg BID  Disposition: Follow up in 2-3 weeks. Proceed to the ED with new/worsening symptoms            Medication Adjustments/Labs and Tests Ordered: Current medicines are reviewed at length with the patient today.  Concerns regarding medicines are outlined above.  Orders Placed This Encounter  Procedures   Basic metabolic panel with GFR   EKG 87-Ozji   Meds ordered this encounter  Medications   spironolactone  (ALDACTONE ) 25 MG tablet    Sig: Take 1/2 tablet (12.5 mg total) by mouth daily.    Dispense:  45 tablet    Refill:  3    Patient  Instructions  Medication Instructions:  Restart Spirolactone 12.5 mg daily   *If you need a refill on your cardiac medications before your next appointment, please call your pharmacy*  Lab Work: BMET in 1-2 weeks   Testing/Procedures: NONE ordered at this time of appointment   Follow-Up: At [facility], you and your health needs are our priority.  As part of our continuing mission to provide you with exceptional heart care, our providers are all part of one team.  This team includes your primary Cardiologist (physician) and Advanced Practice Providers or APPs (Physician Assistants and Nurse Practitioners) who all work together to provide you with the care you need, when you need it.  Your next appointment:   3 week(s)  Provider:   Miriam Shams, NP          We recommend signing up for the patient portal called MyChart.  Sign up information is provided on this After Visit Summary.  MyChart is used to connect with patients for Virtual Visits (Telemedicine).  Patients are able to view lab/test results, encounter notes, upcoming appointments, etc.  Non-urgent messages can be sent to your provider as well.   To learn more about what you can do with MyChart, go to [email or web address].             Signed, Coulton Schlink E Ashlei Chinchilla, NP  10/07/2024 5:05 PM    [facility]     [1]  Current Meds  Medication Sig   alfuzosin  (UROXATRAL ) 10 MG 24 hr tablet Take 1 tablet (10 mg total) by mouth daily with breakfast.   apixaban  (ELIQUIS ) 5 MG TABS tablet Take 1 tablet (5 mg total) by mouth 2 (two) times daily.   atorvastatin  (LIPITOR )  80 MG tablet Take 1 tablet (80 mg total) by mouth daily with breakfast.   benazepril  (LOTENSIN ) 20 MG tablet Take 1 tablet (20 mg total) by mouth daily with supper.   diltiazem  (CARDIZEM  CD) 180 MG 24 hr capsule Take 1 capsule (180 mg total) by mouth every morning.   fluticasone -salmeterol (WIXELA INHUB ) 100-50 MCG/ACT AEPB Inhale 1 puff  into the lungs 2 (two) times daily.   pantoprazole  (PROTONIX ) 40 MG tablet Take 1 tablet (40 mg total) by mouth every evening.   sertraline  (ZOLOFT ) 25 MG tablet Take 1 tablet (25 mg total) by mouth daily.   sertraline  (ZOLOFT ) 25 MG tablet Take 1 tablet (25 mg total) by mouth daily.   spironolactone  (ALDACTONE ) 25 MG tablet Take 1/2 tablet (12.5 mg total) by mouth daily.   [DISCONTINUED] spironolactone  (ALDACTONE ) 25 MG tablet Take 1 tablet (25 mg total) by mouth every morning.   "

## 2024-10-13 ENCOUNTER — Encounter: Payer: Self-pay | Admitting: Medical

## 2024-10-13 ENCOUNTER — Other Ambulatory Visit (HOSPITAL_BASED_OUTPATIENT_CLINIC_OR_DEPARTMENT_OTHER): Payer: Self-pay

## 2024-10-13 ENCOUNTER — Encounter: Admitting: Medical

## 2024-10-13 ENCOUNTER — Ambulatory Visit: Admitting: Medical

## 2024-10-13 VITALS — BP 130/80 | HR 98 | Temp 97.7°F | Resp 15 | Ht 76.0 in | Wt 308.4 lb

## 2024-10-13 DIAGNOSIS — R600 Localized edema: Secondary | ICD-10-CM | POA: Diagnosis not present

## 2024-10-13 DIAGNOSIS — I480 Paroxysmal atrial fibrillation: Secondary | ICD-10-CM

## 2024-10-13 DIAGNOSIS — Z8673 Personal history of transient ischemic attack (TIA), and cerebral infarction without residual deficits: Secondary | ICD-10-CM

## 2024-10-13 DIAGNOSIS — I10 Essential (primary) hypertension: Secondary | ICD-10-CM

## 2024-10-13 DIAGNOSIS — F329 Major depressive disorder, single episode, unspecified: Secondary | ICD-10-CM

## 2024-10-13 DIAGNOSIS — I509 Heart failure, unspecified: Secondary | ICD-10-CM | POA: Diagnosis not present

## 2024-10-13 LAB — COMPREHENSIVE METABOLIC PANEL WITH GFR
ALT: 30 U/L (ref 3–53)
AST: 26 U/L (ref 5–37)
Albumin: 3.8 g/dL (ref 3.5–5.2)
Alkaline Phosphatase: 64 U/L (ref 39–117)
BUN: 11 mg/dL (ref 6–23)
CO2: 28 meq/L (ref 19–32)
Calcium: 8.8 mg/dL (ref 8.4–10.5)
Chloride: 106 meq/L (ref 96–112)
Creatinine, Ser: 0.88 mg/dL (ref 0.40–1.50)
GFR: 83.7 mL/min
Glucose, Bld: 139 mg/dL — ABNORMAL HIGH (ref 70–99)
Potassium: 3.7 meq/L (ref 3.5–5.1)
Sodium: 140 meq/L (ref 135–145)
Total Bilirubin: 0.8 mg/dL (ref 0.2–1.2)
Total Protein: 6.3 g/dL (ref 6.0–8.3)

## 2024-10-13 LAB — BRAIN NATRIURETIC PEPTIDE: Pro B Natriuretic peptide (BNP): 220 pg/mL — ABNORMAL HIGH (ref 1.0–100.0)

## 2024-10-13 MED ORDER — SERTRALINE HCL 50 MG PO TABS
50.0000 mg | ORAL_TABLET | Freq: Every day | ORAL | 0 refills | Status: AC
  Filled 2024-10-13 (×2): qty 30, 30d supply, fill #0

## 2024-10-13 NOTE — Patient Instructions (Signed)
 Reactive depression Mild depression post-stroke, PHQ-9 score 8. Previous sertraline  25 mg dose ineffective.  Did not follow up in a month. - Increased sertraline  to 50 mg daily. - Follow up in one month to assess response.  Congestive heart failure history. Weight gain of 16 pounds after stopping aldactone . Bilateral leg edema 1-2+. - Restarted aldactone  at half dose. - Ordered chest x-ray for pulmonary edema. - Ordered beta natriuretic peptide study with cmp  Paroxysmal atrial fibrillation Normal sinus rhythm. Heart rate controlled with diltiazem . No recent syncope. - Continue Eliquis  for anticoagulation. - Continue diltiazem  for rate control.  Essential hypertension Blood pressure improved after discontinuing benazepril . Currently on aldactone  and diltiazem . - Continue aldactone  12.5 mg daily. - Continue diltiazem  for blood pressure control.  Follow up in one month or sooner if needed. Advise possible sooner after lab and xray review.

## 2024-10-13 NOTE — Progress Notes (Signed)
 "  Subjective:    Patient ID: Hector Edwards, male    DOB: 02-04-1946, 79 y.o.   MRN: [id number]  HPI  Pt last AVS with me in October    Ischemic stroke. Recent ischemic stroke likely cardioembolic due to atrial fibrillation. Residual left hand deficits, no significant left leg deficits. Only fine motor issues but gross momements fine with left hand - Continue Eliquis  5 mg twice daily. -has followed up with neurologist just recently.   PAF suspected suspected to have contributed to the stroke. No current cardiac monitoring. - currently has monitor for 30 days. - Follow up with cardiologist.   CHF Heart failure with no significant leg swelling. Oxygen  saturation 97%. - Continue Lotensin , diltiazem , and spironolactone . - Last bnp controlled. - Monitor for leg swelling.   Hypertension. bp well controlled. Hypertension management ongoing. Blood pressure 122/80 mmHg, within acceptable range. - Continue current antihypertensive regimen.   Sleep apnea Obstructive sleep apnea with previous CPAP use discontinued. Discussed potential link to hypertension and stroke risk. - Consider  following thru with recommend repeat sleep study and to see if can get equipment you can tolerate - Discuss weight loss for management.   Suspect asthma component to recent night time cough - rx wixella sent to mail order. Contact mail order if they don't send in rx let me know. -can send to local pharmacy.       ED- hx of penile implant -   Depression(reactive) -phq-9 score 8 -rx sertraline  25 mg daily.  Recent AVS with cardiologist below in      1. CAD s/p CABG without angina: No chest pain suggestive of angina. Continue statin.  He is off of ASA since he is on Eliquis .    2. HTN: BP is low today. Given his syncope, will stop Benazepril  and aldactone .     3. Hyperlipidemia: LDL 37 in September 2025. Continue statin.    4. Atrial fibrillation, paroxysmal: Sinus on exam today. No  evidence of atrial fib on his cardiac monitor. With possible embolic stroke in September 2025, will continue Eliquis . Continue Cardizem .   5. Aortic stenosis: Moderate by echo in by echo in September 2025. Repeat echo in one year.    6. Chronic diastolic CHF: Wt is down 16 lbs in the last three weeks. No LE edema. Given his fatigue and weakness/soft BP, will hold aldactone .     7. Syncope: Unclear etiology of syncope yesterday. His BP is soft today. This could be orthostatic hypotension. I will stop his Benazepril  and aldactone . He will follow his BP at home. He will push oral hydration over the next several day.  We discussed that an inpatient workup would be appropriate but he refuses admission. He will call EMS if he becomes dizzy or has further syncopal events. His sister lives nearby and will check on him frequently.  BMET and CBC today.  No need for another cardiac monitor as he just completed a monitor two weeks ago May need to consider a loop recorder with further syncopal events He is told not to drive for 6 months post syncope   Labs/ tests ordered today include:    Hector Edwards is a 79 year old male with a history of mild stroke and paroxysmal atrial fibrillation who presents with concerns about depression and weight gain.  He has had mild depressive symptoms since his stroke a couple of months ago with PHQ-9 of 8. He was started on sertraline  25 mg without clear  benefit. He denies suicidal or homicidal ideation.  He has paroxysmal atrial fibrillation and takes Eliquis  and diltiazem  180 mg. Two weeks after a normal heart rate check, he had a syncopal episode attributed to dehydration and low blood pressure. Aldactone  and benazepril  were stopped, then aldactone  was restarted at 12.5 mg with resolution of a 16-pound weight gain. He has had no further syncope or lightheadedness.  His weight is now 308 pounds. He attributes recent weight gain and bilateral leg swelling to  changes in aldactone . He has joined a gym and plans treadmill exercise. He has shortness of breath when he increases his walking pace but tolerates walking at his own pace.  He is not taking benazepril . His blood pressure today is 130/80. Medications were previously adjusted by cardiology for low blood pressure.   Review of Systems  See hpi     Objective:   Physical Exam  General Mental Status- Alert. General Appearance- Not in acute distress.   Skin General: Color- Normal Color. Moisture- Normal Moisture.  Neck Carotid Arteries- Normal color. Moisture- Normal Moisture. No carotid bruits. No JVD.  Chest and Lung Exam Auscultation: Breath Sounds:-CTA  Cardiovascular Auscultation:Rythm- RRR Murmurs & Other Heart Sounds:Auscultation of the heart reveals- No Murmurs.  Abdomen Inspection:-Inspeection Normal. Palpation/Percussion:Note:No mass. Palpation and Percussion of the abdomen reveal- Non Tender, Non Distended + BS, no rebound or guarding.   Neurologic Cranial Nerve exam:- CN [name]-XII intact(No nystagmus), symmetric smile. Strength:- 5/5 equal and symmetric strength both upper and lower extremities.   Lower ext- 1+ pedal edema bilaterally. Negative homans signs. Calfs symmetric.    Assessment & Plan:   Reactive depression Mild depression post-stroke, PHQ-9 score 8. Previous sertraline  25 mg dose ineffective.  Did not follow up in a month. - Increased sertraline  to 50 mg daily. - Follow up in one month to assess response.  Congestive heart failure history. Weight gain of 16 pounds after stopping aldactone . Bilateral leg edema 1-2+. - Restarted aldactone  at half dose. - Ordered chest x-ray for pulmonary edema. - Ordered beta natriuretic peptide study with cmp  Paroxysmal atrial fibrillation Normal sinus rhythm. Heart rate controlled with diltiazem . No recent syncope. - Continue Eliquis  for anticoagulation. - Continue diltiazem  for rate control.  Essential  hypertension Blood pressure improved after discontinuing benazepril . Currently on aldactone  and diltiazem . - Continue aldactone  12.5 mg daily. - Continue diltiazem  for blood pressure control.  Follow up in one month or sooner if needed. Advise possible sooner after lab and xray review.   "

## 2024-10-14 ENCOUNTER — Ambulatory Visit: Payer: Self-pay | Admitting: Medical

## 2024-10-14 ENCOUNTER — Other Ambulatory Visit: Payer: Self-pay

## 2024-10-15 ENCOUNTER — Other Ambulatory Visit: Payer: Self-pay

## 2024-10-15 ENCOUNTER — Ambulatory Visit: Payer: Self-pay | Admitting: Pharmacist

## 2024-10-15 ENCOUNTER — Other Ambulatory Visit (HOSPITAL_COMMUNITY): Payer: Self-pay

## 2024-10-15 NOTE — Progress Notes (Signed)
 Last read by Marine JINNY Sharran Norleen at 8:09AM on

## 2024-10-15 NOTE — Progress Notes (Signed)
 01/08 - Patient's sister aware of the copay and is fine with charging COF. Informed her about hte deductible.

## 2024-10-16 ENCOUNTER — Other Ambulatory Visit: Payer: Self-pay

## 2024-10-16 ENCOUNTER — Other Ambulatory Visit (HOSPITAL_COMMUNITY): Payer: Self-pay

## 2024-10-20 ENCOUNTER — Ambulatory Visit: Payer: Self-pay | Admitting: Pharmacist

## 2024-10-21 ENCOUNTER — Other Ambulatory Visit: Payer: Self-pay | Admitting: Medical

## 2024-10-21 ENCOUNTER — Other Ambulatory Visit (HOSPITAL_BASED_OUTPATIENT_CLINIC_OR_DEPARTMENT_OTHER): Payer: Self-pay

## 2024-10-21 ENCOUNTER — Ambulatory Visit (HOSPITAL_BASED_OUTPATIENT_CLINIC_OR_DEPARTMENT_OTHER)
Admission: RE | Admit: 2024-10-21 | Discharge: 2024-10-21 | Disposition: A | Discharge status: Home / Self Care | Source: Ambulatory Visit | Attending: Medical | Admitting: Medical

## 2024-10-21 ENCOUNTER — Other Ambulatory Visit (HOSPITAL_COMMUNITY): Payer: Self-pay

## 2024-10-21 DIAGNOSIS — R600 Localized edema: Secondary | ICD-10-CM | POA: Insufficient documentation

## 2024-10-21 DIAGNOSIS — I509 Heart failure, unspecified: Secondary | ICD-10-CM | POA: Diagnosis present

## 2024-10-21 LAB — BASIC METABOLIC PANEL WITH GFR
BUN/Creatinine Ratio: 13 (ref 10–24)
BUN: 11 mg/dL (ref 8–27)
CO2: 21 mmol/L (ref 20–29)
Calcium: 9.5 mg/dL (ref 8.6–10.2)
Chloride: 103 mmol/L (ref 96–106)
Creatinine, Ser: 0.86 mg/dL (ref 0.76–1.27)
Glucose: 123 mg/dL — ABNORMAL HIGH (ref 70–99)
Potassium: 3.9 mmol/L (ref 3.5–5.2)
Sodium: 141 mmol/L (ref 134–144)
eGFR: 90 mL/min/1.73

## 2024-10-22 ENCOUNTER — Ambulatory Visit: Payer: Self-pay | Admitting: Emergency Medicine

## 2024-10-26 NOTE — Progress Notes (Unsigned)
 " Cardiology Office Note:    Date:  10/28/2024   ID:  Hector Edwards, DOB [birth year], MRN [id number]  PCP:  Dorina Dallas RIGGERS   [facility] Providers Cardiologist:  Lonni Cash, MD     Referring MD: Dorina Dallas RIGGERS   Chief complaint: Follow-up hypertension, lower extremity edema     History of Present Illness:   Hector Edwards is a 79 y.o. male with a hx of CAD s/p CABG, post-operative atrial fibrillation, obesity, HTN, HLD, aortic stenosis, chronic diastolic CHF, CVA and sleep apnea who is here today for 1 month follow-up of hypertension and lower extremity edema.   Presented to cardiology in September 2023 for the evaluation of dyspnea.  Echo September 2020 with normal LV function and no valvular disease. At his visit here in September 2023 he described progressive dyspnea and fatigue but no chest pain. He has sleep apnea but does not tolerate CPAP. Echo September 2023 with LVEF=60-65%, moderate LVH. Mild aortic stenosis with mean gradient of 12 mmHg. Coronary CTA 07/24/22 with severe disease in the LAD and RCA. Cardiac catheterization October 2023 with severe left main and three vessel CAD. He underwent 3V CABG in October 2023. (LIMA to LAD, SVG to PDA, SVG to posterolateral branch).  Post-op course complicated by atrial fibrillation with RVR  that persisted despite cardizem  and digoxin . He underwent cardioversion on 08/08/2022. Echo prior to discharge in November 2023 with LVEF=60-65%. He was seen in atrial fib clinic following discharge and was continued on Eliquis  and amiodarone . Cardiac monitor March 2024 with no evidence of atrial fib. Amiodarone  and Eliquis  were stopped. He was hospitalized in January 2025 with Covid infection and acute kidney injury. He was seen in our office in July 2025 with LE edema. He was started on aldactone  and had good diuresis. Echo August 2025 with LVEF=50-55%. Severe LVH. Moderate aortic stenosis. He was admitted to [facility] with a  stroke 06/27/24. Echo 06/28/24 with normal LV function and moderate aortic stenosis. His stroke was felt to possibly be cardioembolic and he was started back on Eliquis . Cardiac monitor in October 2025 with no evidence of atrial fibrillation.  Follow-up with Dr. Cash on 08/24/2024, patient had reported multiple syncopal episodes while sitting.  Was not interested in seeking inpatient care at that time. He had lost 16 pounds over the last 3 weeks on Aldactone . Blood pressure in clinic 88/58, benazepril  and Aldactone  stopped.   Most recently evaluated by myself outpatient 10/07/2024, he had regained the weight he had lost while on spironolactone .  We restarted his spironolactone  at half his previous dose given reports of hypertension and significant weight gain, continued to hold the benazepril , with instructions for close follow-up to ensure prevention of overdiuresis.  Presents with his wife, doing well from a cardiovascular standpoint.  Reports he is feeling much better after restarting the spironolactone  at half dose.  He has lost around 15 pounds, but states he feels pretty good.  Swelling has resolved in his legs.  He denies dizziness, lightheadedness, near syncope, further syncopal episodes, chest pain, shortness of breath, orthopnea, PND, dark/tarry/bloody stools, hematuria.  He is not regularly monitoring his blood pressures at home, BP in office 114/76.   ROS:   Please see the history of present illness.    All other systems reviewed and are negative.     Past Medical History:  Diagnosis Date   Depression    GERD (gastroesophageal reflux disease)    Hx of adenomatous colonic polyps 09/04/2015  Hyperlipidemia    Hypertension    LBP (low back pain)    OSA (obstructive sleep apnea) 08/01/2018   Sleep apnea    wears cpap     Past Surgical History:  Procedure Laterality Date   CARDIOVERSION N/A 08/08/2022   Procedure: CARDIOVERSION;  Surgeon: Raford Riggs, MD;  Location: [facility]  ENDOSCOPY;  Service: Cardiovascular;  Laterality: N/A;   COLONOSCOPY  2004, 2021   hyperplastic polyps x 2   CORONARY ARTERY BYPASS GRAFT N/A 08/03/2022   Procedure: CORONARY ARTERY BYPASS GRAFTING (CABG) 3X, USING RIGHT GREATER SAPHENOUS VEIN AND LEFT INTERNAL MAMMARY ARTERY.;  Surgeon: Lucas Dorise POUR, MD;  Location: [facility] OR;  Service: Open Heart Surgery;  Laterality: N/A;   KNEE ARTHROSCOPY Right    LEFT HEART CATH AND CORONARY ANGIOGRAPHY N/A 08/01/2022   Procedure: LEFT HEART CATH AND CORONARY ANGIOGRAPHY;  Surgeon: Verlin Lonni BIRCH, MD;  Location: [facility] INVASIVE CV LAB;  Service: Cardiovascular;  Laterality: N/A;   PENILE PROSTHESIS IMPLANT N/A 01/25/2020   Procedure: PENILE PROTHESIS INFLATABLE COLOPLAST;  Surgeon: Carolee Sherwood BIRCH DOUGLAS, MD;  Location: [facility];  Service: Urology;  Laterality: N/A;   SHOULDER ARTHROSCOPY W/ ROTATOR CUFF REPAIR Left    TEE WITHOUT CARDIOVERSION N/A 08/03/2022   Procedure: TRANSESOPHAGEAL ECHOCARDIOGRAM (TEE);  Surgeon: Lucas Dorise POUR, MD;  Location: [facility] OR;  Service: Open Heart Surgery;  Laterality: N/A;   TEE WITHOUT CARDIOVERSION N/A 08/08/2022   Procedure: TRANSESOPHAGEAL ECHOCARDIOGRAM (TEE);  Surgeon: Raford Riggs, MD;  Location: [facility] ENDOSCOPY;  Service: Cardiovascular;  Laterality: N/A;   TONSILLECTOMY  as child   vocal cord growth removed  age 20   benign   WISDOM TOOTH EXTRACTION      Current Medications: Active Medications[1]   Allergies:   Hctz [hydrochlorothiazide], Enalapril maleate, Tribenzor [olmesartan -amlodipine -hctz], and Verapamil    Social History   Socioeconomic History   Marital status: Divorced    Spouse name: Not on file   Number of children: 1   Years of education: Not on file   Highest education level: Not on file  Occupational History   Occupation: Retired   Occupation: Retired-mailman  Tobacco Use   Smoking status: Never   Smokeless tobacco: Never   Tobacco comments:    Never smoke 08/22/22   Vaping Use   Vaping status: Never Used  Substance and Sexual Activity   Alcohol use: Not Currently    Alcohol/week: 4.0 standard drinks of alcohol    Types: 4 Cans of beer per week    Comment: stop drinking 08/22/22   Drug use: No   Sexual activity: Not Currently  Other Topics Concern   Not on file  Social History Narrative   Regular exercise- yes, seldom; working physically all the time.   Social Drivers of Health   Tobacco Use: Low Risk (10/28/2024)   Patient History    Smoking Tobacco Use: Never    Smokeless Tobacco Use: Never    Passive Exposure: Not on file  Financial Resource Strain: Low Risk (03/06/2022)   Overall Financial Resource Strain (CARDIA)    Difficulty of Paying Living Expenses: Not hard at all  Food Insecurity: No Food Insecurity (06/30/2024)   Epic    Worried About Radiation Protection Practitioner of Food in the Last Year: Never true    Ran Out of Food in the Last Year: Never true  Transportation Needs: No Transportation Needs (06/30/2024)   Epic    Lack of Transportation (Medical): No    Lack of Transportation (Non-Medical): No  Physical Activity: Inactive (03/06/2022)   Exercise Vital Sign    Days of Exercise per Week: 0 days    Minutes of Exercise per Session: 0 min  Stress: No Stress Concern Present (03/06/2022)   [employer] of Occupational Health - Occupational Stress Questionnaire    Feeling of Stress : Not at all  Social Connections: Moderately Isolated (06/28/2024)   Social Connection and Isolation Panel    Frequency of Communication with Friends and Family: More than three times a week    Frequency of Social Gatherings with Friends and Family: More than three times a week    Attends Religious Services: Never    Database Administrator or Organizations: Yes    Attends Engineer, Structural: More than 4 times per year    Marital Status: Divorced  Depression (PHQ2-9): Low Risk (10/13/2024)   Depression (PHQ2-9)    PHQ-2 Score: 0  Alcohol Screen: Low Risk  (03/06/2022)   Alcohol Screen    Last Alcohol Screening Score (AUDIT): 1  Housing: Unknown (06/30/2024)   Epic    Unable to Pay for Housing in the Last Year: No    Number of Times Moved in the Last Year: Not on file    Homeless in the Last Year: No  Recent Concern: Housing - High Risk (06/28/2024)   Epic    Unable to Pay for Housing in the Last Year: Yes    Number of Times Moved in the Last Year: 0    Homeless in the Last Year: Yes  Utilities: Not At Risk (06/30/2024)   Epic    Threatened with loss of utilities: No  Health Literacy: Not on file     Family History: The patient's family history includes Breast cancer in his daughter; Colon polyps in his brother and father; Depression in his mother; Heart disease in his father; Hypertension in his father and another family member; Pancreatic cancer in his brother; Stroke (age of onset: 58) in his mother; Throat cancer in his brother. There is no history of Colon cancer, Esophageal cancer, Stomach cancer, or Rectal cancer.  EKGs/Labs/Other Studies Reviewed:    The following studies were reviewed today:       Recent Labs: 08/24/2024: Hemoglobin 13.0; Platelets 356 10/13/2024: ALT 30; Pro B Natriuretic peptide (BNP) 220.0 10/21/2024: BUN 11; Creatinine, Ser 0.86; Potassium 3.9; Sodium 141  Recent Lipid Panel    Component Value Date/Time   CHOL 76 06/28/2024 0450   CHOL 170 06/16/2019 1035   TRIG 52 06/28/2024 0450   HDL 29 (L) 06/28/2024 0450   HDL 32 (L) 06/16/2019 1035   CHOLHDL 2.6 06/28/2024 0450   VLDL 10 06/28/2024 0450   LDLCALC 37 06/28/2024 0450   LDLCALC 121 (H) 06/16/2019 1035   LDLDIRECT 158.5 10/19/2008 0858     Risk Assessment/Calculations:    CHA2DS2-VASc Score = 6   This indicates a 9.7% annual risk of stroke. The patient's score is based upon: CHF History: 0 HTN History: 1 Diabetes History: 0 Stroke History: 2 Vascular Disease History: 1 Age Score: 2 Gender Score: 0               Physical Exam:     VS:  BP 114/76   Pulse 86   Ht 6' 4 (1.93 m)   Wt 293 lb (132.9 kg)   SpO2 96%   BMI 35.67 kg/m        Wt Readings from Last 3 Encounters:  10/28/24 293 lb (132.9 kg)  10/13/24 (!) 308 lb 6.4 oz (139.9 kg)  10/07/24 (!) 305 lb (138.3 kg)     GEN:  Well nourished, well developed in no acute distress HEENT: Normal NECK:  No carotid bruits CARDIAC:  S1-S2 normal, RRR, no murmurs, rubs, gallops RESPIRATORY:  Clear to auscultation without rales, wheezing or rhonchi  MUSCULOSKELETAL: Trace bilateral edema; No deformity  SKIN: Warm and dry NEUROLOGIC:  Alert and oriented x 3 PSYCHIATRIC:  Normal affect       Assessment & Plan Bilateral lower extremity edema Bilateral lower extremity edema resolved Trace edema present on exam, much improved from prior visit Essential hypertension Syncope and collapse Previously reported syncopal episode likely occurred in the setting of hypotension, BP in office 88/58 at visit with Dr. Verlin.  Benazepril  and Aldactone  stopped.  At follow-up with me on 10/07/2024 patient had gained 15 pounds, had significant bilateral lower extremity pitting edema, and was hypertensive at 156/74. He reports no further syncopal episodes, near syncope dizziness, lightheadedness.  He has lost 15 pounds, since I saw him, edema has improved, pressure stable in clinic today.  Will repeat BMP today and again in 1 month to verify potassium stability while on spironolactone .  Monitor blood pressures at home.  Proceed to the ED with any further episodes of syncope. PAF (paroxysmal atrial fibrillation) (HCC) History of postoperative A-fib without recurrence NSR previous visit, continues to remain on Eliquis  Denies clinically significant bleeding or bruising Continue Eliquis  5 mg twice daily  Disposition: Follow-up with Dr. Verlin in 6 months or sooner if needed.  Proceed to the ED with any new or worsening symptoms.            Medication Adjustments/Labs and  Tests Ordered: Current medicines are reviewed at length with the patient today.  Concerns regarding medicines are outlined above.  Orders Placed This Encounter  Procedures   Basic metabolic panel with GFR   Basic metabolic panel with GFR   No orders of the defined types were placed in this encounter.   Patient Instructions  Medication Instructions:  No medication changes were made during today's visit.  *If you need a refill on your cardiac medications before your next appointment, please call your pharmacy*  Lab Work: BMET TODAY THEN, RETURN IN ONE MONTH FOR REPEAT BMET If you have labs (blood work) drawn today and your tests are completely normal, you will receive your results only by: MyChart Message (if you have MyChart) OR A paper copy in the mail If you have any lab test that is abnormal or we need to change your treatment, we will call you to review the results.  Testing/Procedures: No procedures were ordered during today's visit.   Follow-Up: At [facility], you and your health needs are our priority.  As part of our continuing mission to provide you with exceptional heart care, our providers are all part of one team.  This team includes your primary Cardiologist (physician) and Advanced Practice Providers or APPs (Physician Assistants and Nurse Practitioners) who all work together to provide you with the care you need, when you need it.  Your next appointment:  A letter will be mailed to you as a reminder to call the office for your next follow up appointment.  6 month(s)  Provider:   Lonni Verlin, MD    We recommend signing up for the patient portal called MyChart.  Sign up information is provided on this After Visit Summary.  MyChart is used to connect with patients for Virtual  Visits (Telemedicine).  Patients are able to view lab/test results, encounter notes, upcoming appointments, etc.  Non-urgent messages can be sent to your provider as well.   To  learn more about what you can do with MyChart, go to [email or web address].   Other Instructions            Signed, Hector Roundtree E Tenoch Mcclure, NP  10/28/2024 3:05 PM    [facility]     [1]  Current Meds  Medication Sig   alfuzosin  (UROXATRAL ) 10 MG 24 hr tablet Take 1 tablet (10 mg total) by mouth daily with breakfast.   apixaban  (ELIQUIS ) 5 MG TABS tablet Take 1 tablet (5 mg total) by mouth 2 (two) times daily.   atorvastatin  (LIPITOR ) 80 MG tablet Take 1 tablet (80 mg total) by mouth daily with breakfast.   diltiazem  (CARDIZEM  CD) 180 MG 24 hr capsule Take 1 capsule (180 mg total) by mouth every morning.   pantoprazole  (PROTONIX ) 40 MG tablet Take 1 tablet (40 mg total) by mouth every evening.   sertraline  (ZOLOFT ) 50 MG tablet Take 1 tablet (50 mg total) by mouth daily.   spironolactone  (ALDACTONE ) 25 MG tablet Take 1/2 tablet (12.5 mg total) by mouth daily.   "

## 2024-10-28 ENCOUNTER — Encounter: Payer: Self-pay | Admitting: Emergency Medicine

## 2024-10-28 ENCOUNTER — Ambulatory Visit: Attending: Cardiology | Admitting: Emergency Medicine

## 2024-10-28 VITALS — BP 114/76 | HR 86 | Ht 76.0 in | Wt 293.0 lb

## 2024-10-28 DIAGNOSIS — I480 Paroxysmal atrial fibrillation: Secondary | ICD-10-CM | POA: Diagnosis not present

## 2024-10-28 DIAGNOSIS — R600 Localized edema: Secondary | ICD-10-CM | POA: Diagnosis not present

## 2024-10-28 DIAGNOSIS — R55 Syncope and collapse: Secondary | ICD-10-CM

## 2024-10-28 DIAGNOSIS — I10 Essential (primary) hypertension: Secondary | ICD-10-CM | POA: Diagnosis not present

## 2024-10-28 NOTE — Assessment & Plan Note (Signed)
 Previously reported syncopal episode likely occurred in the setting of hypotension, BP in office 88/58 at visit with Dr. Verlin.  Benazepril  and Aldactone  stopped.  At follow-up with me on 10/07/2024 patient had gained 15 pounds, had significant bilateral lower extremity pitting edema, and was hypertensive at 156/74. He reports no further syncopal episodes, near syncope dizziness, lightheadedness.  He has lost 15 pounds, since I saw him, edema has improved, pressure stable in clinic today.  Will repeat BMP today and again in 1 month to verify potassium stability while on spironolactone .  Monitor blood pressures at home.  Proceed to the ED with any further episodes of syncope.

## 2024-10-28 NOTE — Patient Instructions (Addendum)
 Medication Instructions:  No medication changes were made during today's visit.  *If you need a refill on your cardiac medications before your next appointment, please call your pharmacy*  Lab Work: BMET TODAY THEN, RETURN IN ONE MONTH FOR REPEAT BMET If you have labs (blood work) drawn today and your tests are completely normal, you will receive your results only by: MyChart Message (if you have MyChart) OR A paper copy in the mail If you have any lab test that is abnormal or we need to change your treatment, we will call you to review the results.  Testing/Procedures: No procedures were ordered during today's visit.   Follow-Up: At [facility], you and your health needs are our priority.  As part of our continuing mission to provide you with exceptional heart care, our providers are all part of one team.  This team includes your primary Cardiologist (physician) and Advanced Practice Providers or APPs (Physician Assistants and Nurse Practitioners) who all work together to provide you with the care you need, when you need it.  Your next appointment:  A letter will be mailed to you as a reminder to call the office for your next follow up appointment.  6 month(s)  Provider:   Lonni Cash, MD    We recommend signing up for the patient portal called MyChart.  Sign up information is provided on this After Visit Summary.  MyChart is used to connect with patients for Virtual Visits (Telemedicine).  Patients are able to view lab/test results, encounter notes, upcoming appointments, etc.  Non-urgent messages can be sent to your provider as well.   To learn more about what you can do with MyChart, go to [email or web address].   Other Instructions

## 2024-10-29 ENCOUNTER — Ambulatory Visit: Payer: Self-pay | Admitting: Emergency Medicine

## 2024-10-29 LAB — BASIC METABOLIC PANEL WITH GFR
BUN/Creatinine Ratio: 12 (ref 10–24)
BUN: 11 mg/dL (ref 8–27)
CO2: 20 mmol/L (ref 20–29)
Calcium: 10.2 mg/dL (ref 8.6–10.2)
Chloride: 102 mmol/L (ref 96–106)
Creatinine, Ser: 0.94 mg/dL (ref 0.76–1.27)
Glucose: 107 mg/dL — ABNORMAL HIGH (ref 70–99)
Potassium: 3.9 mmol/L (ref 3.5–5.2)
Sodium: 142 mmol/L (ref 134–144)
eGFR: 84 mL/min/1.73

## 2025-04-28 ENCOUNTER — Ambulatory Visit: Admitting: Adult Health
